# Patient Record
Sex: Male | Born: 1960
Health system: Southern US, Community
[De-identification: ages and names within clinical notes are randomized; demographics above are authoritative.]

## PROBLEM LIST (undated history)

## (undated) DIAGNOSIS — Z720 Tobacco use: Secondary | ICD-10-CM

## (undated) DIAGNOSIS — M549 Dorsalgia, unspecified: Secondary | ICD-10-CM

## (undated) DIAGNOSIS — I219 Acute myocardial infarction, unspecified: Secondary | ICD-10-CM

## (undated) DIAGNOSIS — G8929 Other chronic pain: Secondary | ICD-10-CM

## (undated) DIAGNOSIS — R011 Cardiac murmur, unspecified: Secondary | ICD-10-CM

## (undated) DIAGNOSIS — Z8739 Personal history of other diseases of the musculoskeletal system and connective tissue: Secondary | ICD-10-CM

## (undated) DIAGNOSIS — Z5900 Homelessness unspecified: Secondary | ICD-10-CM

## (undated) DIAGNOSIS — K219 Gastro-esophageal reflux disease without esophagitis: Secondary | ICD-10-CM

## (undated) DIAGNOSIS — K209 Esophagitis, unspecified without bleeding: Secondary | ICD-10-CM

## (undated) DIAGNOSIS — M1990 Unspecified osteoarthritis, unspecified site: Secondary | ICD-10-CM

## (undated) DIAGNOSIS — E785 Hyperlipidemia, unspecified: Secondary | ICD-10-CM

## (undated) DIAGNOSIS — Z9289 Personal history of other medical treatment: Secondary | ICD-10-CM

## (undated) DIAGNOSIS — I10 Essential (primary) hypertension: Secondary | ICD-10-CM

## (undated) DIAGNOSIS — Z0389 Encounter for observation for other suspected diseases and conditions ruled out: Secondary | ICD-10-CM

## (undated) DIAGNOSIS — F1010 Alcohol abuse, uncomplicated: Secondary | ICD-10-CM

## (undated) DIAGNOSIS — K709 Alcoholic liver disease, unspecified: Secondary | ICD-10-CM

## (undated) DIAGNOSIS — Z87898 Personal history of other specified conditions: Secondary | ICD-10-CM

## (undated) DIAGNOSIS — F329 Major depressive disorder, single episode, unspecified: Secondary | ICD-10-CM

## (undated) DIAGNOSIS — F1910 Other psychoactive substance abuse, uncomplicated: Secondary | ICD-10-CM

## (undated) DIAGNOSIS — F32A Depression, unspecified: Secondary | ICD-10-CM

## (undated) HISTORY — DX: Homelessness unspecified: Z59.00

## (undated) HISTORY — DX: Essential (primary) hypertension: I10

## (undated) HISTORY — DX: Alcohol abuse, uncomplicated: F10.10

## (undated) HISTORY — DX: Homelessness: Z59.0

## (undated) HISTORY — DX: Hyperlipidemia, unspecified: E78.5

## (undated) HISTORY — DX: Personal history of other specified conditions: Z87.898

## (undated) HISTORY — PX: FRACTURE SURGERY: SHX138

## (undated) HISTORY — DX: Other psychoactive substance abuse, uncomplicated: F19.10

## (undated) HISTORY — DX: Tobacco use: Z72.0

## (undated) HISTORY — DX: Acute myocardial infarction, unspecified: I21.9

---

## 1994-05-01 HISTORY — PX: TUMOR EXCISION: SHX421

## 2000-04-17 ENCOUNTER — Emergency Department (HOSPITAL_COMMUNITY): Admission: EM | Admit: 2000-04-17 | Discharge: 2000-04-17 | Payer: Self-pay

## 2001-05-01 DIAGNOSIS — I219 Acute myocardial infarction, unspecified: Secondary | ICD-10-CM

## 2001-05-01 HISTORY — DX: Acute myocardial infarction, unspecified: I21.9

## 2002-09-22 ENCOUNTER — Encounter: Payer: Self-pay | Admitting: Emergency Medicine

## 2002-09-22 ENCOUNTER — Emergency Department (HOSPITAL_COMMUNITY): Admission: EM | Admit: 2002-09-22 | Discharge: 2002-09-22 | Payer: Self-pay | Admitting: Emergency Medicine

## 2003-05-02 DIAGNOSIS — Z9289 Personal history of other medical treatment: Secondary | ICD-10-CM

## 2003-05-02 HISTORY — DX: Personal history of other medical treatment: Z92.89

## 2003-05-20 ENCOUNTER — Emergency Department (HOSPITAL_COMMUNITY): Admission: EM | Admit: 2003-05-20 | Discharge: 2003-05-21 | Payer: Self-pay | Admitting: Emergency Medicine

## 2003-06-30 HISTORY — PX: CLOSED REDUCTION TIBIAL FRACTURE: SHX1361

## 2003-07-30 ENCOUNTER — Inpatient Hospital Stay (HOSPITAL_COMMUNITY): Admission: AC | Admit: 2003-07-30 | Discharge: 2003-08-26 | Payer: Self-pay

## 2003-08-10 HISTORY — PX: PSEUDOANEURYSM REPAIR: SHX2272

## 2003-08-18 HISTORY — PX: FASCIOTOMY CLOSURE: SHX5829

## 2004-06-10 ENCOUNTER — Emergency Department (HOSPITAL_COMMUNITY): Admission: EM | Admit: 2004-06-10 | Discharge: 2004-06-10 | Payer: Self-pay | Admitting: Emergency Medicine

## 2004-06-14 ENCOUNTER — Emergency Department (HOSPITAL_COMMUNITY): Admission: EM | Admit: 2004-06-14 | Discharge: 2004-06-14 | Payer: Self-pay | Admitting: Emergency Medicine

## 2004-06-15 ENCOUNTER — Emergency Department (HOSPITAL_COMMUNITY): Admission: EM | Admit: 2004-06-15 | Discharge: 2004-06-15 | Payer: Self-pay | Admitting: Emergency Medicine

## 2004-06-19 ENCOUNTER — Emergency Department (HOSPITAL_COMMUNITY): Admission: EM | Admit: 2004-06-19 | Discharge: 2004-06-19 | Payer: Self-pay | Admitting: Emergency Medicine

## 2004-08-23 ENCOUNTER — Observation Stay (HOSPITAL_COMMUNITY): Admission: EM | Admit: 2004-08-23 | Discharge: 2004-08-23 | Payer: Self-pay | Admitting: Emergency Medicine

## 2005-03-27 ENCOUNTER — Emergency Department (HOSPITAL_COMMUNITY): Admission: EM | Admit: 2005-03-27 | Discharge: 2005-03-28 | Payer: Self-pay | Admitting: Emergency Medicine

## 2005-05-01 DIAGNOSIS — IMO0001 Reserved for inherently not codable concepts without codable children: Secondary | ICD-10-CM

## 2005-05-01 HISTORY — PX: CARDIAC CATHETERIZATION: SHX172

## 2005-05-01 HISTORY — DX: Reserved for inherently not codable concepts without codable children: IMO0001

## 2006-02-11 ENCOUNTER — Inpatient Hospital Stay (HOSPITAL_COMMUNITY): Admission: EM | Admit: 2006-02-11 | Discharge: 2006-02-15 | Payer: Self-pay | Admitting: Emergency Medicine

## 2006-02-11 ENCOUNTER — Encounter (INDEPENDENT_AMBULATORY_CARE_PROVIDER_SITE_OTHER): Payer: Self-pay | Admitting: *Deleted

## 2007-04-06 ENCOUNTER — Emergency Department (HOSPITAL_COMMUNITY): Admission: EM | Admit: 2007-04-06 | Discharge: 2007-04-07 | Payer: Self-pay | Admitting: Emergency Medicine

## 2007-04-24 ENCOUNTER — Emergency Department (HOSPITAL_COMMUNITY): Admission: EM | Admit: 2007-04-24 | Discharge: 2007-04-24 | Payer: Self-pay | Admitting: Emergency Medicine

## 2007-06-18 ENCOUNTER — Emergency Department (HOSPITAL_COMMUNITY): Admission: EM | Admit: 2007-06-18 | Discharge: 2007-06-19 | Payer: Self-pay | Admitting: Emergency Medicine

## 2008-05-01 ENCOUNTER — Inpatient Hospital Stay (HOSPITAL_COMMUNITY): Admission: EM | Admit: 2008-05-01 | Discharge: 2008-05-04 | Payer: Self-pay | Admitting: Emergency Medicine

## 2010-02-01 ENCOUNTER — Ambulatory Visit: Payer: Self-pay | Admitting: Internal Medicine

## 2010-02-01 ENCOUNTER — Observation Stay (HOSPITAL_COMMUNITY): Admission: EM | Admit: 2010-02-01 | Discharge: 2010-02-02 | Payer: Self-pay | Admitting: Emergency Medicine

## 2010-02-02 ENCOUNTER — Encounter: Payer: Self-pay | Admitting: Internal Medicine

## 2010-02-02 DIAGNOSIS — I252 Old myocardial infarction: Secondary | ICD-10-CM

## 2010-02-02 DIAGNOSIS — R002 Palpitations: Secondary | ICD-10-CM | POA: Insufficient documentation

## 2010-02-02 DIAGNOSIS — F1020 Alcohol dependence, uncomplicated: Secondary | ICD-10-CM | POA: Insufficient documentation

## 2010-02-02 DIAGNOSIS — I43 Cardiomyopathy in diseases classified elsewhere: Secondary | ICD-10-CM

## 2010-02-02 DIAGNOSIS — I119 Hypertensive heart disease without heart failure: Secondary | ICD-10-CM

## 2010-02-09 ENCOUNTER — Ambulatory Visit: Payer: Self-pay | Admitting: Internal Medicine

## 2010-02-09 ENCOUNTER — Encounter: Payer: Self-pay | Admitting: Internal Medicine

## 2010-02-09 LAB — CONVERTED CEMR LAB
HDL: 100 mg/dL (ref >39)
Triglycerides: 58 mg/dL (ref <150)

## 2010-02-16 ENCOUNTER — Telehealth: Payer: Self-pay | Admitting: Licensed Clinical Social Worker

## 2010-02-28 ENCOUNTER — Encounter: Payer: Self-pay | Admitting: Licensed Clinical Social Worker

## 2010-03-07 ENCOUNTER — Emergency Department (HOSPITAL_COMMUNITY): Admission: EM | Admit: 2010-03-07 | Discharge: 2010-03-07 | Payer: Self-pay | Admitting: Emergency Medicine

## 2010-04-12 ENCOUNTER — Ambulatory Visit: Payer: Self-pay | Admitting: Internal Medicine

## 2010-05-22 ENCOUNTER — Emergency Department (HOSPITAL_COMMUNITY)
Admission: EM | Admit: 2010-05-22 | Discharge: 2010-05-22 | Payer: Self-pay | Source: Home / Self Care | Admitting: Emergency Medicine

## 2010-05-24 LAB — POCT CARDIAC MARKERS
CKMB, poc: 13.2 ng/mL (ref 1.0–8.0)
Troponin i, poc: <0.05 ng/mL (ref 0.00–0.09)

## 2010-05-24 LAB — CBC
HCT: 39.4 % (ref 39.0–52.0)
Hemoglobin: 13.2 g/dL (ref 13.0–17.0)
MCHC: 33.5 g/dL (ref 30.0–36.0)
MCV: 93.1 fL (ref 78.0–100.0)

## 2010-05-24 LAB — POCT I-STAT, CHEM 8
Chloride: 105 mEq/L (ref 96–112)
Creatinine, Ser: 0.9 mg/dL (ref 0.4–1.5)
Glucose, Bld: 93 mg/dL (ref 70–99)
Potassium: 4.1 mEq/L (ref 3.5–5.1)

## 2010-05-25 ENCOUNTER — Ambulatory Visit: Admission: RE | Admit: 2010-05-25 | Discharge: 2010-05-25 | Payer: Self-pay | Source: Home / Self Care

## 2010-05-25 DIAGNOSIS — E785 Hyperlipidemia, unspecified: Secondary | ICD-10-CM | POA: Insufficient documentation

## 2010-05-31 NOTE — Discharge Summary (Signed)
Summary: Hospital Discharge Update    Hospital Discharge Update:  Date of Admission: 02/01/2010 Date of Discharge: 02/02/2010  Brief Summary:  Devin [name] was admitted for palpitations and SOB while moving a stove up a flight of stairs. ACS was ruled out, and his symptoms had resolved within hours of hospitalization. In 2003, he had a similar episode of paliptations/SOB that was diagnosed as an MI at [facility] in [place]. In 2007, he had a prolonged episode of sharp chest pain that was worked up at [facility] as atypical chest pain secondary to gastritis. During that admission, he had an echocardiogram showing mild LVH with 75% ejection fraction. He also had a cardiac catheterization in 2007 showing no vessel disease. During this admission, Devin [name] was found to have new, non-specific T-wave inversions and P-wave inversions on EKG that may be secondary to worsening LVH.  His hospital course was significant for HTN in the 140-150s/90-100s. He was discharged on metoprolol and HCTZ.  Labs needed at follow-up: Basic metabolic panel  Other follow-up issues:  -HTN medication management -chest pain and palpitation/SOB symptoms -alcohol abuse -medication adherence (financial concerns) ---> set up with Jaynee Eagles  Problem list changes:  Added new problem of HYPERTENSION, MODERATE (ICD-401.9) Added new problem of OLD MYOCARDIAL INFARCTION (ICD-412) Added new problem of PALPITATIONS (ICD-785.1) Added new problem of ALCOHOL ABUSE (ICD-305.00) Added new problem of INADEQUATE MATERIAL RESOURCES (ICD-V60.2)  Medication list changes:  Added new medication of METOPROLOL TARTRATE 50 MG TABS (METOPROLOL TARTRATE) Take 1 tablet by mouth two times a day. - Signed Added new medication of HYDROCHLOROTHIAZIDE 25 MG TABS (HYDROCHLOROTHIAZIDE) Take 1 tablet by mouth once a day. - Signed Added new medication of ASPIRIN 81 MG TBEC (ASPIRIN) Take 1 tablet by mouth once a day. Rx of METOPROLOL TARTRATE 50 MG  TABS (METOPROLOL TARTRATE) Take 1 tablet by mouth two times a day.;  #62 x 1;  Signed;  Entered by: Melida Quitter MD;  Authorized by: Melida Quitter MD;  Method used: Print then Give to Patient Rx of HYDROCHLOROTHIAZIDE 25 MG TABS (HYDROCHLOROTHIAZIDE) Take 1 tablet by mouth once a day.;  #31 x 0;  Signed;  Entered by: Melida Quitter MD;  Authorized by: Melida Quitter MD;  Method used: Print then Give to Patient  The medication, problem, and allergy lists have been updated.  Please see the dictated discharge summary for details.  Discharge medications:  METOPROLOL TARTRATE 50 MG TABS (METOPROLOL TARTRATE) Take 1 tablet by mouth two times a day. HYDROCHLOROTHIAZIDE 25 MG TABS (HYDROCHLOROTHIAZIDE) Take 1 tablet by mouth once a day. ASPIRIN 81 MG TBEC (ASPIRIN) Take 1 tablet by mouth once a day.  Other patient instructions:  1. Please return to [facility] on the ground floor of [facility] on Wednesday 02/09/10 at 1030 am for follow-up with Dr. Scot Dock. 2. Please take metoprolol, hydrochlorothiazide, and aspirin as directed.  Note: Hospital Discharge Medications & Other Instructions handout was printed, one copy for patient and a second copy to be placed in hospital chart.    Complete Medication List: 1)  Metoprolol Tartrate 50 Mg Tabs (Metoprolol tartrate) .... Take 1 tablet by mouth two times a day. 2)  Hydrochlorothiazide 25 Mg Tabs (Hydrochlorothiazide) .... Take 1 tablet by mouth once a day. 3)  Aspirin 81 Mg Tbec (Aspirin) .... Take 1 tablet by mouth once a day.  Prescriptions: HYDROCHLOROTHIAZIDE 25 MG TABS (HYDROCHLOROTHIAZIDE) Take 1 tablet by mouth once a day.  #31 x 0  Entered and Authorized by:   Melida Quitter MD   Signed by:   Melida Quitter MD on 02/02/2010   Method used:   Print then Give to Patient   RxID:   [id number] METOPROLOL TARTRATE 50 MG TABS (METOPROLOL TARTRATE) Take 1 tablet by mouth two times a day.  #62 x 1    Entered and Authorized by:   Melida Quitter MD   Signed by:   Melida Quitter MD on 02/02/2010   Method used:   Print then Give to Patient   RxID:   [id number]

## 2010-05-31 NOTE — Assessment & Plan Note (Signed)
Summary: HFU/NEW TO CLINIC/SB.   Vital Signs:  Patient profile:   50 year old male Height:      70 inches Weight:      160.0 pounds BMI:     23.04 Temp:     97.3 degrees F oral Pulse rate:   96 / minute BP sitting:   145 / 90  (right arm)  Vitals Entered By: Filomena Jungling NT II (February 09, 2010 10:19 AM) CC: HFU Is Patient Diabetic? No Pain Assessment Patient in pain? no      Nutritional Status BMI of 19 -24 = normal  Have you ever been in a relationship where you felt threatened, hurt or afraid?No   Does patient need assistance? Functional Status Self care Ambulation Normal   Primary Care Provider:  Bethel Born MD  CC:  HFU.  History of Present Illness: 50 y/o m with HTN, alcaholism comes for hfu  - did not take his meds as no money to buy them. He says he will get some money tomorrow - has not been drinking since dischrge - no CP, but does feel some occasional palpitation - no dizziness, but does has some black spots in his visual speed sometimes when he gets up. this has been going on for 1 yr now.  - has brought all the paperwork for SW meeting today but Lorri Frederick is on vacation for a week   Preventive Screening-Counseling & Management  Alcohol-Tobacco     Smoking Status: OCCASIONAL     Packs/Day: 1 EVERY COUPLE OF WEEKS  Current Medications (verified): 1)  Metoprolol Tartrate 50 Mg Tabs (Metoprolol Tartrate) .... Take 1 Tablet By Mouth Two Times A Day. 2)  Hydrochlorothiazide 25 Mg Tabs (Hydrochlorothiazide) .... Take 1 Tablet By Mouth Once A Day. 3)  Aspirin 81 Mg Tbec (Aspirin) .... Take 1 Tablet By Mouth Once A Day.  Allergies (verified): No Known Drug Allergies  Social History: Smoking Status:  OCCASIONAL Packs/Day:  1 EVERY COUPLE OF WEEKS  Review of Systems  The patient denies anorexia, fever, weight loss, weight gain, vision loss, decreased hearing, hoarseness, chest pain, syncope, dyspnea on exertion, peripheral edema, prolonged cough,  headaches, hemoptysis, abdominal pain, melena, hematochezia, severe indigestion/heartburn, hematuria, incontinence, genital sores, muscle weakness, suspicious skin lesions, transient blindness, difficulty walking, depression, unusual weight change, abnormal bleeding, enlarged lymph nodes, angioedema, breast masses, and testicular masses.    Physical Exam  General:  General: Alert, well developed and in no acurte distress ENT: mucous membranes pink & moist. No abnormal finds in ear and nose. CVC:S1 S2 , no murmurs, no abnormal heart sounds. Lungs: Clear to auscultation B/L. No wheezes, crackles or other abnormal sounds Abdomen: soft, non distended, no tender. Normal Bowel sounds EXT: no pitting edema, no engorged veins, Pulsations normal  Neuro:alert, oriented *3, cranial nerved 2-12 intact, strenght normal in all  extremities, senstations normal to light touch.     Impression & Recommendations:  Problem # 1:  INADEQUATE MATERIAL RESOURCES (ICD-V60.2)  Refer to Jaynee Eagles for assistance  Orders: Social Work Referral (Social )  Problem # 2:  ALCOHOL ABUSE (ICD-305.00)  not drinking since discharge.  Refer to [name] for counselling)  plan -counselled against starting alcahol  Orders: Social Work Referral (Social )  Problem # 3:  PALPITATIONS (ICD-785.1) not taking meds not had any palpitation or CP since discharge  plan -follow up after meeting with Jaynee Eagles  His updated medication list for this problem includes:    Metoprolol Tartrate  50 Mg Tabs (Metoprolol tartrate) .Marland Kitchen... Take 1 tablet by mouth two times a day.  Avoid caffeine, chocolate, and stimulants. Stress reduction as well as medication options discussed.   Problem # 4:  HYPERTENSION, MODERATE (ICD-401.9) he has not been taking any meds for high BP since discharge no anti-HTN meds in sample room BP acceptable  plan: await meeting with Jaynee Eagles for financial assistance follow up in  1 month FLP and  HbA1c for risk stratification  (patinet is insistent we get the labs today and not wait until he gets is orange card)  His updated medication list for this problem includes:    Metoprolol Tartrate 50 Mg Tabs (Metoprolol tartrate) .Marland Kitchen... Take 1 tablet by mouth two times a day.    Hydrochlorothiazide 25 Mg Tabs (Hydrochlorothiazide) .Marland Kitchen... Take 1 tablet by mouth once a day.  BP today: 145/90  Orders: T-Lipid Profile ([id number]) T-Hgb A1C (in-house) (41324MW)  Complete Medication List: 1)  Metoprolol Tartrate 50 Mg Tabs (Metoprolol tartrate) .... Take 1 tablet by mouth two times a day. 2)  Hydrochlorothiazide 25 Mg Tabs (Hydrochlorothiazide) .... Take 1 tablet by mouth once a day. 3)  Aspirin 81 Mg Tbec (Aspirin) .... Take 1 tablet by mouth once a day.  Patient Instructions: 1)  Please schedule a follow-up appointment in 1 month.  Process Orders Check Orders Results:     [facility]: ABN not required for this insurance Tests Sent for requisitioning (February 09, 2010 11:30 AM):     02/09/2010: [facility] -- T-Lipid Profile [phone] (signed)     Prevention & Chronic Care Immunizations   Influenza vaccine: Not documented   Influenza vaccine deferral: Refused  (02/09/2010)    Tetanus booster: Not documented   Td booster deferral: Refused  (02/09/2010)    Pneumococcal vaccine: Not documented  Other Screening   PSA: Not documented   Smoking status: OCCASIONAL  (02/09/2010)  Lipids   Total Cholesterol: Not documented   Lipid panel action/deferral: Deferred   LDL: Not documented   LDL Direct: Not documented   HDL: Not documented   Triglycerides: Not documented  Hypertension   Last Blood Pressure: 145 / 90  (02/09/2010)   Serum creatinine: Not documented   Serum potassium Not documented    Hypertension flowsheet reviewed?: Yes   Progress toward BP goal: Unchanged  Self-Management Support :    Patient will work on the following  items until the next clinic visit to reach self-care goals:     Medications and monitoring: take my medicines every day  (02/09/2010)     Eating: eat more vegetables, use fresh or frozen vegetables, eat foods that are low in salt, eat baked foods instead of fried foods, eat fruit for snacks and desserts  (02/09/2010)     Activity: take a 30 minute walk every day  (02/09/2010)    Hypertension self-management support: Education handout, Resources for patients handout, Written self-care plan  (02/09/2010)   Hypertension self-care plan printed.   Hypertension education handout printed      Resource handout printed.    Appended Document: Lab Order/a1c result    Lab Visit  Laboratory Results   Blood Tests   Date/Time Received: February 09, 2010 11:51 AM Date/Time Reported: Alric Quan  February 09, 2010 11:51 AM   HGBA1C: 5.2%   (Normal Range: Non-Diabetic - 3-6%   Control Diabetic - 6-8%)    Orders Today:

## 2010-05-31 NOTE — Progress Notes (Signed)
  Phone Note Outgoing Call   Summary of Call: Left message for patient to call Soc. Work  Follow-up for Phone Call        Left another message for Devin [name] to call SW.  Sending letter to his home.  [name]  February 28, 2010 10:03 AM

## 2010-05-31 NOTE — Letter (Signed)
Summary: Generic Letter  [facility]  [address]   [place], [place] [address]   Phone: [phone]  Fax: [phone]    02/28/2010     Devin [name] [address] [place], [place]  [address]  Dear [name],  Your physician asked that I contact you to explain the various resources that may be available to you through the health system and the community.  Please call me at [phone] between the houres of 9AM to 1PM.   Regards,      [name], LCSW Clinical Social Worker [facility]

## 2010-05-31 NOTE — Miscellaneous (Signed)
Summary: PATIENT CONSENT FORM  PATIENT CONSENT FORM   Imported By: Louretta Parma 02/10/2010 15:46:51  _____________________________________________________________________  External Attachment:    Type:   Image     Comment:   External Document

## 2010-06-02 NOTE — Assessment & Plan Note (Signed)
Summary: EST-F/U VISIT/CH   Vital Signs:  Patient profile:   50 year old male Height:      70 inches (177.80 cm) Weight:      156.7 pounds (71.23 kg) BMI:     22.57 Temp:     97.9 degrees F (36.61 degrees C) oral Pulse rate:   103 / minute BP sitting:   128 / 83  (left arm)  Vitals Entered By: Stanton Kidney Ditzler RN (May 25, 2010 4:10 PM) Is Patient Diabetic? No Pain Assessment Patient in pain? no      Nutritional Status BMI of 19 -24 = normal Nutritional Status Detail appetite down  Have you ever been in a relationship where you felt threatened, hurt or afraid?denies   Does patient need assistance? Functional Status Self care Ambulation Normal Comments Refills on meds - went to ER 05/22/10 - out of meds x 2 weeks. Past year blurred vision and dizziness.   Primary Care Provider:  Bethel Born MD   History of Present Illness: 50 y/o m with HTN, alcaholism comes for medication refill  no complaint today. He was in the ER 3 ays ago for CP which was ruled out for ACS. He has not been able to take his medication for 2 weeks due to financial reasons.   he continues to drink (hardliquor frequently) ,  he smokes occasionally. no drugs.     Depression History:      The patient denies a depressed mood most of the day and a diminished interest in his usual daily activities.         Preventive Screening-Counseling & Management  Alcohol-Tobacco     Smoking Status: quit     Smoking Cessation Counseling: yes     Packs/Day: 1 EVERY COUPLE OF WEEKS  Current Medications (verified): 1)  Metoprolol Tartrate 50 Mg Tabs (Metoprolol Tartrate) .... Take 1 Tablet By Mouth Two Times A Day. 2)  Hydrochlorothiazide 25 Mg Tabs (Hydrochlorothiazide) .... Take 1 Tablet By Mouth Once A Day. 3)  Aspirin 81 Mg Tbec (Aspirin) .... Take 1 Tablet By Mouth Once A Day.  Allergies (verified): No Known Drug Allergies  Social History: Smoking Status:  quit  Review of Systems  The patient  denies anorexia, fever, weight loss, weight gain, vision loss, decreased hearing, hoarseness, chest pain, syncope, dyspnea on exertion, peripheral edema, prolonged cough, headaches, hemoptysis, abdominal pain, melena, hematochezia, severe indigestion/heartburn, hematuria, incontinence, genital sores, muscle weakness, suspicious skin lesions, transient blindness, difficulty walking, depression, unusual weight change, abnormal bleeding, enlarged lymph nodes, angioedema, breast masses, and testicular masses.    Physical Exam  General:  Gen: VS reveiwed, Alert, well developed, nodistress ENT: mucous membranes pink & moist. No abnormal finds in ear and nose. CVC:S1 S2 , no murmurs, no abnormal heart sounds. Lungs: Clear to auscultation B/L. No wheezes, crackles or other abnormal sounds Abdomen: soft, non distended, no tender. Normal Bowel sounds EXT: no pitting edema, no engorged veins, Pulsations normal  Neuro:alert, oriented *3, cranial nerved 2-12 intact, strenght normal in all  extremities, senstations normal to light touch.      Impression & Recommendations:  Problem # 1:  INADEQUATE MATERIAL RESOURCES (ICD-V60.2) will get him in touch in [facility] today, he has not worked out his orange card paperwork still  Problem # 2:  HYPERTENSION, MODERATE (ICD-401.9) controlled  His updated medication list for this problem includes:    Metoprolol Tartrate 50 Mg Tabs (Metoprolol tartrate) .Marland Kitchen... Take 1 tablet by mouth two times a day.  Hydrochlorothiazide 25 Mg Tabs (Hydrochlorothiazide) .Marland Kitchen... Take 1 tablet by mouth once a day.  BP today: 128/83 Prior BP: 130/86 (04/12/2010)  Labs Reviewed: Chol: 186 (02/09/2010)   HDL: 100 (02/09/2010)   LDL: 74 (02/09/2010)   TG: 58 (02/09/2010)  Problem # 3:  PALPITATIONS (ICD-785.1) still tachycardic, not compliant with medications he says he will get his medications next week  His updated medication list for this problem includes:    Metoprolol  Tartrate 50 Mg Tabs (Metoprolol tartrate) .Marland Kitchen... Take 1 tablet by mouth two times a day.  Avoid caffeine, chocolate, and stimulants. Stress reduction as well as medication options discussed.   Problem # 4:  HYPERLIPIDEMIA (ICD-272.4) will start pravastatin given his high risk factorrs.   His updated medication list for this problem includes:    Pravastatin Sodium 20 Mg Tabs (Pravastatin sodium) .Marland Kitchen... Take 1 tablet by mouth once a day  Complete Medication List: 1)  Metoprolol Tartrate 50 Mg Tabs (Metoprolol tartrate) .... Take 1 tablet by mouth two times a day. 2)  Hydrochlorothiazide 25 Mg Tabs (Hydrochlorothiazide) .... Take 1 tablet by mouth once a day. 3)  Aspirin 81 Mg Tbec (Aspirin) .... Take 1 tablet by mouth once a day. 4)  Pravastatin Sodium 20 Mg Tabs (Pravastatin sodium) .... Take 1 tablet by mouth once a day  Patient Instructions: 1)  Please schedule a follow-up appointment in 2 weeks. Prescriptions: PRAVASTATIN SODIUM 20 MG TABS (PRAVASTATIN SODIUM) Take 1 tablet by mouth once a day  #31 x 3   Entered and Authorized by:   Bethel Born MD   Signed by:   Bethel Born MD on 05/25/2010   Method used:   Electronically to        [facility] (retail)       [address]       [place], [place]  [address]       Ph: [phone]       Fax: [phone]   RxID:   [id number]    Orders Added: 1)  Est. Patient Level IV [03474]     Prevention & Chronic Care Immunizations   Influenza vaccine: Not documented   Influenza vaccine deferral: Refused  (02/09/2010)    Tetanus booster: Not documented   Td booster deferral: Refused  (02/09/2010)    Pneumococcal vaccine: Not documented  Other Screening   PSA: Not documented   Smoking status: quit  (05/25/2010)  Lipids   Total Cholesterol: 186  (02/09/2010)   Lipid panel action/deferral: Deferred   LDL: 74  (02/09/2010)   LDL Direct: Not documented   HDL: 100  (02/09/2010)    Triglycerides: 58  (02/09/2010)    SGOT (AST): Not documented   SGPT (ALT): Not documented   Alkaline phosphatase: Not documented   Total bilirubin: Not documented  Hypertension   Last Blood Pressure: 128 / 83  (05/25/2010)   Serum creatinine: Not documented   Serum potassium Not documented  Self-Management Support :   Personal Goals (by the next clinic visit) :      Personal blood pressure goal: 140/90  (05/25/2010)   Patient will work on the following items until the next clinic visit to reach self-care goals:     Medications and monitoring: take my medicines every day, bring all of my medications to every visit  (05/25/2010)     Eating: eat more vegetables, use fresh or frozen vegetables, eat fruit for snacks and desserts  (05/25/2010)  Activity: take a 30 minute walk every day, park at the far end of the parking lot  (05/25/2010)    Hypertension self-management support: Written self-care plan, Education handout, Resources for patients handout  (05/25/2010)   Hypertension self-care plan printed.   Hypertension education handout printed    Lipid self-management support: Resources for patients handout  (05/25/2010)        Resource handout printed.

## 2010-06-02 NOTE — Assessment & Plan Note (Signed)
Summary: est-1 month f/u visit/ch   Vital Signs:  Patient profile:   50 year old male Height:      70 inches (177.80 cm) Weight:      157 pounds (71.36 kg) BMI:     22.61 Temp:     97.7 degrees F (36.50 degrees C) oral Pulse rate:   106 / minute BP sitting:   130 / 86  (right arm)  Vitals Entered By: Angelina Ok RN (April 12, 2010 3:38 PM) CC: Depression Is Patient Diabetic? No Nutritional Status BMI of 19 -24 = normal  Have you ever been in a relationship where you felt threatened, hurt or afraid?No   Does patient need assistance? Functional Status Self care Ambulation Normal Comments Needs refills on meds.  Needs check up.   Primary Care Provider:  Bethel Born MD  CC:  Depression.  History of Present Illness: 50 y/o m with HTN, alcaholism comes for hfu  - has been compliant with medications - smokes 1-2 cig/day - drinks hard liquor frequently - no new complaints today, requests disability benifit   Depression History:      The patient denies a depressed mood most of the day and a diminished interest in his usual daily activities.  The patient denies impaired concentration (indecisiveness).         Preventive Screening-Counseling & Management  Alcohol-Tobacco     Smoking Status: OCCASIONAL     Smoking Cessation Counseling: yes     Packs/Day: 1 EVERY COUPLE OF WEEKS  Current Medications (verified): 1)  Metoprolol Tartrate 50 Mg Tabs (Metoprolol Tartrate) .... Take 1 Tablet By Mouth Two Times A Day. 2)  Hydrochlorothiazide 25 Mg Tabs (Hydrochlorothiazide) .... Take 1 Tablet By Mouth Once A Day. 3)  Aspirin 81 Mg Tbec (Aspirin) .... Take 1 Tablet By Mouth Once A Day.  Allergies (verified): No Known Drug Allergies  Review of Systems  The patient denies anorexia, fever, weight loss, weight gain, vision loss, decreased hearing, hoarseness, chest pain, syncope, dyspnea on exertion, peripheral edema, prolonged cough, headaches, hemoptysis, abdominal pain,  melena, hematochezia, severe indigestion/heartburn, hematuria, incontinence, genital sores, muscle weakness, suspicious skin lesions, transient blindness, difficulty walking, depression, unusual weight change, abnormal bleeding, enlarged lymph nodes, angioedema, breast masses, and testicular masses.    Physical Exam  General:  Gen: VS reveiwed, Alert, well developed, nodistress ENT: mucous membranes pink & moist. No abnormal finds in ear and nose. CVC:S1 S2 , no murmurs, no abnormal heart sounds. Lungs: Clear to auscultation B/L. No wheezes, crackles or other abnormal sounds Abdomen: soft, non distended, no tender. Normal Bowel sounds EXT: no pitting edema, no engorged veins, Pulsations normal  Neuro:alert, oriented *3, cranial nerved 2-12 intact, strenght normal in all  extremities, senstations normal to light touch.      Impression & Recommendations:  Problem # 1:  ALCOHOL ABUSE (ICD-305.00) conitnues to drink and smoke counselled against drinking refuses consulling session with Vladimir Creeks  Problem # 2:  HYPERTENSION, MODERATE (ICD-401.9) well controlled with current meds patient is compliant now   His updated medication list for this problem includes:    Metoprolol Tartrate 50 Mg Tabs (Metoprolol tartrate) .Marland Kitchen... Take 1 tablet by mouth two times a day.    Hydrochlorothiazide 25 Mg Tabs (Hydrochlorothiazide) .Marland Kitchen... Take 1 tablet by mouth once a day.  Problem # 3:  PALPITATIONS (ICD-785.1)  Patient is agitated and anxious HR >100 is on metorpolol two times a day dd- anxiety, hyperthyroidism, withdrawal, cocaine?? (he refuses strongly), back pain  plan-  conitnue toprol check TSH,  His updated medication list for this problem includes:    Metoprolol Tartrate 50 Mg Tabs (Metoprolol tartrate) .Marland Kitchen... Take 1 tablet by mouth two times a day.  Orders: T-TSH ([id number])  Complete Medication List: 1)  Metoprolol Tartrate 50 Mg Tabs (Metoprolol tartrate) .... Take 1 tablet  by mouth two times a day. 2)  Hydrochlorothiazide 25 Mg Tabs (Hydrochlorothiazide) .... Take 1 tablet by mouth once a day. 3)  Aspirin 81 Mg Tbec (Aspirin) .... Take 1 tablet by mouth once a day.  Patient Instructions: 1)  Please schedule a follow-up appointment in 3 months. 2)  Tobacco is very bad for your health and your loved ones! You Should stop smoking!. 3)  Stop Smoking Tips: Choose a Quit date. Cut down before the Quit date. decide what you will do as a substitute when you feel the urge to smoke(gum,toothpick,exercise). 4)  It is important that you exercise regularly at least 20 minutes 5 times a week. If you develop chest pain, have severe difficulty breathing, or feel very tired , stop exercising immediately and seek medical attention. 5)  It is not healthy  for men to drink more than 2-3 drinks per day or for women to drink more than 1-2 drinks per day. Prescriptions: METOPROLOL TARTRATE 50 MG TABS (METOPROLOL TARTRATE) Take 1 tablet by mouth two times a day.  #62 x 3   Entered and Authorized by:   Bethel Born MD   Signed by:   Bethel Born MD on 04/12/2010   Method used:   Electronically to        [facility] (retail)       [address]       [place], [place]  [address]       Ph: [phone]       Fax: [phone]   RxID:   [id number] HYDROCHLOROTHIAZIDE 25 MG TABS (HYDROCHLOROTHIAZIDE) Take 1 tablet by mouth once a day.  #31 x 3   Entered and Authorized by:   Bethel Born MD   Signed by:   Bethel Born MD on 04/12/2010   Method used:   Electronically to        [facility] (retail)       [address]       [place], [place]  [address]       Ph: [phone]       Fax: [phone]   RxID:   [id number]    Orders Added: 1)  T-TSH [id number] 2)  Est. Patient Level IV [62694]    Prevention & Chronic Care Immunizations   Influenza vaccine: Not documented   Influenza vaccine deferral:  Refused  (02/09/2010)    Tetanus booster: Not documented   Td booster deferral: Refused  (02/09/2010)    Pneumococcal vaccine: Not documented  Other Screening   PSA: Not documented   Smoking status: OCCASIONAL  (04/12/2010)  Lipids   Total Cholesterol: 186  (02/09/2010)   Lipid panel action/deferral: Deferred   LDL: 74  (02/09/2010)   LDL Direct: Not documented   HDL: 100  (02/09/2010)   Triglycerides: 58  (02/09/2010)  Hypertension   Last Blood Pressure: 130 / 86  (04/12/2010)   Serum creatinine: Not documented   Serum potassium Not documented    Hypertension flowsheet reviewed?: Yes   Progress toward BP goal: Improved  Self-Management Support :  Patient will work on the following items until the next clinic visit to reach self-care goals:     Medications and monitoring: take my medicines every day, bring all of my medications to every visit  (04/12/2010)     Eating: drink diet soda or water instead of juice or soda, eat more vegetables, use fresh or frozen vegetables, eat foods that are low in salt, eat baked foods instead of fried foods, eat fruit for snacks and desserts, limit or avoid alcohol  (04/12/2010)     Activity: take a 30 minute walk every day  (04/12/2010)    Hypertension self-management support: Written self-care plan, Education handout, Pre-printed educational material, Resources for patients handout  (04/12/2010)   Hypertension self-care plan printed.   Hypertension education handout printed      Resource handout printed.    Vital Signs:  Patient profile:   50 year old male Height:      70 inches (177.80 cm) Weight:      157 pounds (71.36 kg) BMI:     22.61 Temp:     97.7 degrees F (36.50 degrees C) oral Pulse rate:   106 / minute BP sitting:   130 / 86  (right arm)  Vitals Entered By: Angelina Ok RN (April 12, 2010 3:38 PM)   Process Orders Check Orders Results:     [facility]: ABN not required for this  insurance Order queued for requisitioning for [facility]: April 12, 2010 4:22 PM Tests Sent for requisitioning (April 12, 2010 4:36 PM):     04/12/2010: [facility] -- T-TSH [phone] (signed)

## 2010-06-08 ENCOUNTER — Ambulatory Visit: Payer: Self-pay | Admitting: Internal Medicine

## 2010-06-08 ENCOUNTER — Ambulatory Visit (INDEPENDENT_AMBULATORY_CARE_PROVIDER_SITE_OTHER): Payer: Self-pay | Admitting: Internal Medicine

## 2010-06-08 ENCOUNTER — Encounter: Payer: Self-pay | Admitting: Internal Medicine

## 2010-06-08 DIAGNOSIS — I252 Old myocardial infarction: Secondary | ICD-10-CM

## 2010-06-08 DIAGNOSIS — I10 Essential (primary) hypertension: Secondary | ICD-10-CM

## 2010-06-08 LAB — BASIC METABOLIC PANEL
BUN: 19 mg/dL (ref 6–23)
CO2: 24 mEq/L (ref 19–32)
Chloride: 99 mEq/L (ref 96–112)
Creat: 1.07 mg/dL (ref 0.40–1.50)
Glucose, Bld: 80 mg/dL (ref 70–99)

## 2010-06-08 MED ORDER — METOPROLOL TARTRATE 50 MG PO TABS: 50.0000 mg | ORAL_TABLET | Freq: Two times a day (BID) | ORAL | Status: DC

## 2010-06-08 MED ORDER — HYDROCHLOROTHIAZIDE 25 MG PO TABS: 25.0000 mg | ORAL_TABLET | Freq: Every day | ORAL | Status: DC

## 2010-06-08 NOTE — Assessment & Plan Note (Addendum)
Seen in 2003 at [facility] for "Heart Attack"  He has a limited history of this-no episodes since then. Not clear if UDS done at last [facility] admission in Nov 2011 for palpitations-will obtain today and refill medications, update pills

## 2010-06-08 NOTE — Progress Notes (Signed)
Addended byChinita Pester on: 06/08/2010 04:01 PM   Modules accepted: Orders

## 2010-06-08 NOTE — Assessment & Plan Note (Signed)
Here for refill of his BP medications-he is homeless, says only occasional alcohol use since [place]  Is applying for SSI-not approved at this point. Has had several rejections. Has not had an atty work with him onthis recently.

## 2010-06-08 NOTE — Progress Notes (Signed)
  Subjective:    Patient ID: Devin [name], male    DOB: 07/19/1960, 50 y.o.   MRN: [id number]  HPI    Review of Systems     Objective:   Physical Exam  Nursing note and vitals reviewed. Constitutional: He is oriented to person, place, and time. He appears well-developed and well-nourished.  Cardiovascular: Normal rate.   Pulmonary/Chest: No respiratory distress. He has no wheezes. He has no rales. He exhibits no tenderness.  Neurological: He is alert and oriented to person, place, and time.  Skin: Skin is warm.  Psychiatric: He has a normal mood and affect. His behavior is normal.          Assessment & Plan:

## 2010-06-09 LAB — DRUGS OF ABUSE SCREEN W/O ALC, ROUTINE URINE
Barbiturate Quant, Ur: NEGATIVE
Creatinine,U: 163.8 mg/dL
Opiate Screen, Urine: NEGATIVE
Propoxyphene: NEGATIVE

## 2010-07-13 LAB — CBC
HCT: 39.3 % (ref 39.0–52.0)
MCHC: 33.1 g/dL (ref 30.0–36.0)
RDW: 13.3 % (ref 11.5–15.5)

## 2010-07-13 LAB — COMPREHENSIVE METABOLIC PANEL
ALT: 40 U/L (ref 0–53)
AST: 74 U/L — ABNORMAL HIGH (ref 0–37)
Alkaline Phosphatase: 81 U/L (ref 39–117)
CO2: 25 mEq/L (ref 19–32)
Chloride: 104 mEq/L (ref 96–112)
GFR calc non Af Amer: >60 mL/min (ref >60)
Glucose, Bld: 100 mg/dL — ABNORMAL HIGH (ref 70–99)
Potassium: 3.9 mEq/L (ref 3.5–5.1)
Sodium: 136 mEq/L (ref 135–145)

## 2010-07-13 LAB — RAPID URINE DRUG SCREEN, HOSP PERFORMED
Barbiturates: NOT DETECTED
Benzodiazepines: NOT DETECTED
Cocaine: NOT DETECTED
Opiates: NOT DETECTED

## 2010-07-13 LAB — POCT CARDIAC MARKERS
Troponin i, poc: <0.05 ng/mL (ref 0.00–0.09)
Troponin i, poc: <0.05 ng/mL (ref 0.00–0.09)

## 2010-07-13 LAB — POCT I-STAT, CHEM 8
Calcium, Ion: 1.22 mmol/L (ref 1.12–1.32)
Chloride: 106 mEq/L (ref 96–112)
HCT: 43 % (ref 39.0–52.0)
Hemoglobin: 14.6 g/dL (ref 13.0–17.0)

## 2010-07-13 LAB — DIFFERENTIAL
Basophils Absolute: 0 10*3/uL (ref 0.0–0.1)
Basophils Relative: 0 % (ref 0–1)
Monocytes Absolute: 0.5 10*3/uL (ref 0.1–1.0)
Neutro Abs: 3.4 10*3/uL (ref 1.7–7.7)
Neutrophils Relative %: 71 % (ref 43–77)

## 2010-07-13 LAB — PROTIME-INR: Prothrombin Time: 14.8 seconds (ref 11.6–15.2)

## 2010-08-13 ENCOUNTER — Emergency Department (HOSPITAL_COMMUNITY): Payer: Self-pay

## 2010-08-13 ENCOUNTER — Emergency Department (HOSPITAL_COMMUNITY)
Admission: EM | Admit: 2010-08-13 | Discharge: 2010-08-14 | Disposition: A | Discharge status: Home / Self Care | Payer: Self-pay | Attending: Emergency Medicine | Admitting: Emergency Medicine

## 2010-08-13 DIAGNOSIS — R4182 Altered mental status, unspecified: Secondary | ICD-10-CM | POA: Insufficient documentation

## 2010-08-13 DIAGNOSIS — F1010 Alcohol abuse, uncomplicated: Secondary | ICD-10-CM | POA: Insufficient documentation

## 2010-08-13 DIAGNOSIS — R269 Unspecified abnormalities of gait and mobility: Secondary | ICD-10-CM | POA: Insufficient documentation

## 2010-08-13 LAB — CBC
Hemoglobin: 14 g/dL (ref 13.0–17.0)
MCH: 33.3 pg (ref 26.0–34.0)
MCV: 96.2 fL (ref 78.0–100.0)
RBC: 4.21 MIL/uL — ABNORMAL LOW (ref 4.22–5.81)
WBC: 6.4 10*3/uL (ref 4.0–10.5)

## 2010-08-13 LAB — URINALYSIS, ROUTINE W REFLEX MICROSCOPIC
Bilirubin Urine: NEGATIVE
Glucose, UA: NEGATIVE mg/dL
Hgb urine dipstick: NEGATIVE
Ketones, ur: NEGATIVE mg/dL
Protein, ur: NEGATIVE mg/dL
Urobilinogen, UA: 0.2 mg/dL (ref 0.0–1.0)

## 2010-08-13 LAB — DIFFERENTIAL
Basophils Relative: 1 % (ref 0–1)
Lymphs Abs: 2.4 10*3/uL (ref 0.7–4.0)
Monocytes Relative: 11 % (ref 3–12)
Neutro Abs: 3.1 10*3/uL (ref 1.7–7.7)
Neutrophils Relative %: 48 % (ref 43–77)

## 2010-08-13 LAB — RAPID URINE DRUG SCREEN, HOSP PERFORMED
Amphetamines: NOT DETECTED
Barbiturates: NOT DETECTED
Benzodiazepines: NOT DETECTED
Cocaine: NOT DETECTED

## 2010-08-13 LAB — BASIC METABOLIC PANEL
Chloride: 105 mEq/L (ref 96–112)
GFR calc non Af Amer: >60 mL/min (ref >60)
Glucose, Bld: 96 mg/dL (ref 70–99)
Potassium: 3.9 mEq/L (ref 3.5–5.1)
Sodium: 138 mEq/L (ref 135–145)

## 2010-08-13 LAB — GLUCOSE, CAPILLARY: Glucose-Capillary: 94 mg/dL (ref 70–99)

## 2010-08-14 LAB — COMPREHENSIVE METABOLIC PANEL
AST: 78 U/L — ABNORMAL HIGH (ref 0–37)
BUN: 15 mg/dL (ref 6–23)
CO2: 22 mEq/L (ref 19–32)
Chloride: 109 mEq/L (ref 96–112)
Creatinine, Ser: 0.83 mg/dL (ref 0.4–1.5)
GFR calc non Af Amer: >60 mL/min (ref >60)
Glucose, Bld: 90 mg/dL (ref 70–99)
Total Bilirubin: 0.8 mg/dL (ref 0.3–1.2)

## 2010-08-14 LAB — AMMONIA: Ammonia: 36 umol/L — ABNORMAL HIGH (ref 11–35)

## 2010-08-14 LAB — ETHANOL
Alcohol, Ethyl (B): 401 mg/dL (ref 0–10)
Alcohol, Ethyl (B): 356 mg/dL — ABNORMAL HIGH (ref 0–10)

## 2010-08-15 LAB — CBC
MCV: 93.8 fL (ref 78.0–100.0)
Platelets: 317 10*3/uL (ref 150–400)
WBC: 8.7 10*3/uL (ref 4.0–10.5)

## 2010-08-15 LAB — BASIC METABOLIC PANEL
BUN: 13 mg/dL (ref 6–23)
Calcium: 9 mg/dL (ref 8.4–10.5)
Chloride: 105 mEq/L (ref 96–112)
Creatinine, Ser: 1.14 mg/dL (ref 0.4–1.5)

## 2010-09-13 ENCOUNTER — Encounter: Payer: Self-pay | Admitting: Internal Medicine
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
## 2010-09-16 NOTE — Discharge Summary (Signed)
Devin [name], Devin [name]                ACCOUNT NO.:  [id number]   MEDICAL RECORD NO.:  [id number]          PATIENT TYPE:  INP   LOCATION:  3706                         FACILITY:  [facility]   PHYSICIAN:  Hind I Elsaid, MD      DATE OF BIRTH:  1960/12/26   DATE OF ADMISSION:  02/10/2006  DATE OF DISCHARGE:  02/15/2006                                 DISCHARGE SUMMARY   DISCHARGE DIAGNOSES:  1. Atypical chest pain.  2. Hypertension.  3. Alcoholic abuse.  4. Sinusitis.  5. Anemia.   DISCHARGE MEDICATIONS:  1. Metoprolol 50 mg p.o. b.i.d.  2. Thiamine 100 mg p.o. daily.  3. Protonix 40 mg p.o. daily.  4. Folic acid 1 mg p.o. daily.  5. Ferrous sulfate 325 mg p.o. t.i.d.   PLANS ON DISCHARGE:  To follow a gastroenterologist and primary care  physician.   CONSULT:  Cardiology consulted from [facility] regarding the chest  pain.   BRIEF HISTORY:  [name] is a 50 year old male with a history of  alcohol abuse, admitted you can review the admission done by Dr. Michaelyn Barter, mainly admitted for chest pain and syncope and found to have  elevated alcoholic level on admission.  On hospital stay, regarding the  chest pain, the patient admitted to telemetry floor for cardiac monitor.  His cardiac enzymes was checked, which was completely negative.  Cardiology  was consulted where an echo done, which showed left ventricular hypertrophy  and, according to cardiology, they recommend myocardial perfusion studies,  which showed some abnormalities, where echo showed ejection fraction of 75%  and there is no regional wall motion abnormalities.  Myocardial perfusion  was done, which revealed debility of anterior septal wall segment, which is  gone with ischemia with ejection fraction of 65%.  According to that,  cardiology recommended the patient to undergo a cardiac cath, which was done  on October 17, where the results were coronary artery was completely normal  and cardiology  recommended no coronary artery disease, will continue  treatment for hypertension and recommend proton pump inhibitor and may need  gastroenterologist to workup if the abdominal pain continues.  So, the  patient was discharged on metoprolol 50 mg p.o. b.i.d.  During  hospitalization, patient also for syncope mainly.  Since the cardiac monitor  was completely negative, we recommend that syncope is mainly due to alcohol  abuse and alcoholic intoxication at that time.  Hypertension according to  the echo, which showed left ventricular hypertrophy.  We will continue with  metoprolol.  Patient also had mild elevation of LFTs, where alkaline  phosphatase was 171, AST 59 and ALT 39.  We did a hepatitis profile, which  was completely negative and we assume this is due to alcoholic hepatitis.  Patient discharged on thiamine and folic acid, but during hospitalization,  patient offered alcoholic group and he refused and he admitted that he does  not have any alcoholism.  With anemia, anemia workup was done, which showed  a ferritin of 9, iron of 42 and total iron binding 32 and saturation  of 10%.  Accordingly, the patient is started on ferrous sulfate 325 mg p.o. daily and  folic acid, due to alcoholism.  Patient advised to follow up with the  gastroenterologist for the chest painand anemia.  He could have gastritis.  On discharge, patient was completely stable with a vital sign of a  temperature of 97.4, pulse rate 74,  respiratory rate of 20, blood pressure of 119/79.  The patient's lab is  sodium 140, potassium 3.9, chloride 106, bicarb 26, BUN 8, creatinine 0.9  and glucose 96.  White blood cells of 6.7, hemoglobin of 10.9, hematocrit  32.1 and platelets 309.      Hind Bosie Helper, MD  Electronically Signed     HIE/MEDQ  D:  02/19/2006  T:  02/19/2006  Job:  [id number]

## 2010-09-16 NOTE — Op Note (Signed)
Devin [name], Devin [name]                          ACCOUNT NO.:  [id number]   MEDICAL RECORD NO.:  [id number]                   PATIENT TYPE:  INP   LOCATION:                                       FACILITY:  [facility]   PHYSICIAN:  Di Kindle. Edilia Bo, M.D.        DATE OF BIRTH:  1961/01/10   DATE OF PROCEDURE:  08/18/2003  DATE OF DISCHARGE:                                 OPERATIVE REPORT   PREOPERATIVE DIAGNOSIS:  Status post fasciotomy of left lower extremity.   POSTOPERATIVE DIAGNOSIS:  Status post fasciotomy of left lower extremity.   OPERATION PERFORMED:  Closure of fasciotomy.   SURGEON:  Di Kindle. Edilia Bo, M.D.   ASSISTANT:  Nurse.   ANESTHESIA:  General.   DESCRIPTION OF PROCEDURE:  The patient was taken to the operating room and  received a general anesthetic.  The left lower extremity was prepped and  draped in the usual sterile fashion.  The edges of the skin were undermined  slightly and the wound irrigated with saline.  The wound was then closed  with interrupted 3-0 nylon vertical mattress sutures.  Of note, there was  some tension on the wound and therefore I made some small stab incisions  with a 15 blade along the posterior aspect of this incision in three rows  providing some relaxation.  Sterile dressing was applied.  The patient  tolerated the procedure well.                                               Di Kindle. Edilia Bo, M.D.    CSD/MEDQ  D:  08/18/2003  T:  08/19/2003  Job:  [id number]

## 2010-09-16 NOTE — Discharge Summary (Signed)
Devin [name], Devin [name]                ACCOUNT NO.:  [id number]   MEDICAL RECORD NO.:  [id number]          PATIENT TYPE:  INP   LOCATION:  3706                         FACILITY:  [facility]   PHYSICIAN:  Hind I Elsaid, MD      DATE OF BIRTH:  12-17-1960   DATE OF ADMISSION:  02/10/2006  DATE OF DISCHARGE:  02/15/2006                                 DISCHARGE SUMMARY   ADDENDUM   LABORATORIES:  On discharge sodium was 140, potassium was 3.9, chloride 106,  CO2 was 26, glucose 96, BUN 8, creatinine 0.8, calcium 9.4.  His hepatic  function tests were bilirubin 0.3, direct 1.1, and alkaline phosphatase 70,  AST 44, ALT 44, total protein 6.4, albumin is 2.9.  Hepatitis B surface  antigen was 93.  Hepatitis B core antibody IgM was negative.  Hepatitis A  IgM was negative.  Hepatitis C antibody was negative.  Folate level was 631.  Free T4 0.93 and TSH is 1.765.      Hind Bosie Helper, MD  Electronically Signed     HIE/MEDQ  D:  02/15/2006  T:  02/16/2006  Job:  [id number]
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
## 2010-09-16 NOTE — Discharge Summary (Signed)
Devin [name], Devin [name]                ACCOUNT NO.:  [id number]   MEDICAL RECORD NO.:  [id number]          PATIENT TYPE:  INP   LOCATION:  3706                         FACILITY:  [facility]   PHYSICIAN:  Hind I Elsaid, MD      DATE OF BIRTH:  Sep 23, 1960   DATE OF ADMISSION:  02/10/2006  DATE OF DISCHARGE:  02/15/2006                                 DISCHARGE SUMMARY   DISCHARGE DIAGNOSES:  1. Atypical chest pain, which could be due to gastritis.  2. Alcohol abuse.  3. Hypertension.  4. Anemia.   PROCEDURE:  February 11, 2006, patient underwent an ultrasound of the abdomen  due to atypical chest pain and mildly elevated liver function tests.  Ultrasound was negative.  February 11, 2006, CT angio was done for the  patient, which was negative.  No sign of pulmonary embolism.  October 16th,  patient underwent myocardial perfusion studies which showed an area of  reversibility in the anterior septal wall, compatible with ischemia and an  ejection fraction of 65%.  October 17th, patient underwent cardiac cath  which showed no evidence of coronary disease in the left dominant system and  normal left ventricular systolic function.  Mild left ventricular  hypertrophy.  No renal artery stenosis or abdominal aortic aneurysm.   DISCHARGE MEDICATIONS:  Folic acid 1 mg p.o. daily, ferrous sulfate 235 mg  p.o. t.i.d. and Protonix 40 mg p.o. daily, thiamine 100 mg p.o. daily,  metoprolol 50 mg p.o. b.i.d.   BRIEF HISTORY:  Devin [name] is a 50 year old gentleman who was admitted with  chest pain, you can see the dictation done by Dr. Michaelyn Barter, and also  a history of syncope where  an alcohol level at that time, where it found to  have a level of 238, and cardiology consult by Dr. Julieanne Manson.   HOSPITAL COURSE:  Cardiology was consulted for the chest pain where the  patient underwent myocardial perfusion studies, which are shown as above,  and for the high LFTs a hepatitis profile was done.  The  patient admitted  that he had a history of alcoholism on and off but does not like( alcohol  abuse.)  He refused to do any alcohol rehab or group.  LFTs were on the high  side, with AST 59 and ALT 39, alk phos 71 and total bilirubin was normal.   HOSPITAL COURSE FOR SYNCOPE:  Patient admitted to tele where a cardiac  monitor was negative.  Cardiology was consulted.  There is no obvious cause  for the syncope other than a high level of alcohol at that time.  Echo was  done which showed normal ejection fraction.  Left ventricular ejection  fraction estimated to be 75.  There was no diagnostic evidence of left  ventricular regional wall motion abnormalities and the right ventricular  wall thickness was mildly to moderately increased.  Further anemia workup  ended up with a ferritin level of 9, iron of 42, total iron binding capacity  of 432 and saturation of 10%.  Accordingly, patient was started on ferrous  sulfate 325  mg p.o. b.i.d. and folic acid.  The mild elevation of LFTs, alk  phos 71, AST 59, and ALT 39.  We did a hepatitis profile, which was  completely negative and ultrasound of the liver which was normal and we  presumed the diagnosis was due to alcoholism.  For the chest pain, it is  atypical chest pain and it was advised that if the chest pain continues to  be continuous, the patient has to be followed up with a gastroenterologist  for possible endoscopy as an outpatient.  On  discharge, the patient prescribed Protonix and folic acid, ferrous sulfate  and thiamine, and for the high blood pressure with evidence on the echo,  there is an obvious ventricular hypertrophy or thickening, metoprolol 50 mg  p.o. b.i.d. was prescribed to the patient.      Hind Bosie Helper, MD  Electronically Signed     HIE/MEDQ  D:  02/15/2006  T:  02/16/2006  Job:  [id number]
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
## 2010-09-16 NOTE — Cardiovascular Report (Signed)
NAMESAFIR, Devin [name]                ACCOUNT NO.:  [id number]   MEDICAL RECORD NO.:  [id number]          PATIENT TYPE:  INP   LOCATION:  3706                         FACILITY:  [facility]   PHYSICIAN:  Thereasa Solo. Little, M.D. DATE OF BIRTH:  20-Jan-1961   DATE OF PROCEDURE:  02/14/2006  DATE OF DISCHARGE:                              CARDIAC CATHETERIZATION   INDICATIONS FOR TEST:  This 50 year old male was admitted with elevated CK  with negative MBs; and a blood troponin.  He had been having chest  discomfort; and had a nuclear study performed that showed anterior ischemia  and because of this, is brought to the cath lab for cardiac catheterization.   After obtaining informed consent, the patient was prepped and draped in the  usual sterile fashion exposing the right groin.  Then following a local  anesthetic with 1% Xylocaine, the Seldinger technique was employed; and a 5-  [place] introducer sheath was placed into the right femoral artery.  Left-and-  right coronary arteriography, ventriculography in the RAO projection, and a  distal aortogram was performed.   COMPLICATIONS:  None.   TOTAL CONTRAST USED:  85 mL.   EQUIPMENT:  5-French Judkins configuration catheters.   RESULTS   HEMODYNAMIC MONITORING:  Central aortic pressure was 106/73.  Left  ventricular pressure was 110/0; and the left ventricular end-diastolic  pressure was 3.   VENTRICULOGRAPHY:  1. Ventriculography in the RAO projection using 20 mL of contrast at 12 mL      per second revealed normal LV systolic function with an ejection      fraction in excess of 60%.  The end-diastolic pressure was 3; and there      was mild LVH.  2. Distal aortogram:  Distal aortogram done at the level of the renals      using 25 mL at 12 mL per second showed no evidence of renal artery      stenosis.  No abdominal aortic aneurysm or iliac disease.   CORONARY ARTERIOGRAPHY:  On fluoroscopy no calcification was seen.   FINDINGS:  1.  Left main normal.  2. The LAD crossed the apex of the heart; and actually supplied the distal      half of the posterior septum.  There was a large first diagonal branch      and both the LAD and the diagonal were free of disease.  3. The circumflex was a large dominant vessel.  It gave rise to a large OM      vessel that was approximately 4 mm in diameter and free of disease.      The second, third, and fourth OM vessels were smaller and free of      disease.  The PDA was free of disease; and there was no disease in the      circumflex or in the AV groove.  4. Right coronary artery:  This was a small nondominant vessel free of      disease.   CONCLUSION:  1. No evidence of coronary disease in a left dominant system.  2. Normal LV systolic  function.  3. Mild LVH.  4. No renal artery stenosis or abdominal aortic aneurysm.   Because of the patient's social situation, he is limited; and is unable to  be in a situation where he can be inactive for the next 18 hours following  his cardiac catheterization.  As result of this he will need to stay in the  hospital overnight and be discharged in the morning.           ______________________________  Thereasa Solo. Little, M.D.     ABL/MEDQ  D:  02/14/2006  T:  02/15/2006  Job:  [id number]   cc:   Cath Lab  [facility]
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
## 2010-10-25 ENCOUNTER — Ambulatory Visit (HOSPITAL_COMMUNITY)
Admission: RE | Admit: 2010-10-25 | Discharge: 2010-10-25 | Disposition: A | Discharge status: Home / Self Care | Payer: Self-pay | Source: Ambulatory Visit | Attending: Internal Medicine | Admitting: Internal Medicine

## 2010-10-25 ENCOUNTER — Encounter: Payer: Self-pay | Admitting: Internal Medicine

## 2010-10-25 ENCOUNTER — Ambulatory Visit (INDEPENDENT_AMBULATORY_CARE_PROVIDER_SITE_OTHER): Payer: Self-pay | Admitting: Internal Medicine

## 2010-10-25 DIAGNOSIS — R9431 Abnormal electrocardiogram [ECG] [EKG]: Secondary | ICD-10-CM | POA: Insufficient documentation

## 2010-10-25 DIAGNOSIS — R079 Chest pain, unspecified: Secondary | ICD-10-CM

## 2010-10-25 DIAGNOSIS — I10 Essential (primary) hypertension: Secondary | ICD-10-CM

## 2010-10-25 MED ORDER — ASPIRIN 81 MG PO TBEC: 81.0000 mg | DELAYED_RELEASE_TABLET | Freq: Every day | ORAL | Status: DC

## 2010-10-25 MED ORDER — HYDROCHLOROTHIAZIDE 25 MG PO TABS: 25.0000 mg | ORAL_TABLET | Freq: Every day | ORAL | Status: DC

## 2010-10-25 MED ORDER — NITROGLYCERIN 0.3 MG SL SUBL: 0.3000 mg | SUBLINGUAL_TABLET | SUBLINGUAL | Status: DC | PRN

## 2010-10-25 MED ORDER — METOPROLOL TARTRATE 50 MG PO TABS: 50.0000 mg | ORAL_TABLET | Freq: Two times a day (BID) | ORAL | Status: DC

## 2010-10-25 MED ORDER — PRAVASTATIN SODIUM 20 MG PO TABS: 20.0000 mg | ORAL_TABLET | Freq: Every day | ORAL | Status: DC

## 2010-10-25 MED ORDER — RANITIDINE HCL 300 MG PO TABS: 300.0000 mg | ORAL_TABLET | Freq: Every day | ORAL | Status: DC

## 2010-10-25 NOTE — Progress Notes (Signed)
Addended by: Scot Dock, MADHAV V on: 10/25/2010 05:48 PM   Modules accepted: Orders

## 2010-10-25 NOTE — Assessment & Plan Note (Signed)
Pain is atypical given its been there for years. He does have significant risk factors including alcohol abuse, cigarette smoking, hypertension, tachycardia. Other differential for alcohol-induced gastritis, GERD, demand ischemia from tachycardia, anxiety. Plan EKG: Shows some T-wave flattening and inversion in V4 to V6 which was present in the old EKG, LVH with repolarization, sinus tachycardia. No significant new EKG changes. Given a prescription off metoprolol, HCTZ, Pepcid and strongly counseled to comply with his medications for 2 weeks. Also put in a referral for cardiology for outpatient Myoview, although it's unclear if he will follow up with them given his history of noncompliance in the past I will see him back in 2 weeks and if he still has this chest pain while taking these medications, might need an admission for further workup for ACS. Given normal catheterization 5 years ago it is unlikely to be severe ischemic chest pain.Dr. Aundria Rud agrees with the plan

## 2010-10-25 NOTE — Patient Instructions (Signed)
It is extremely important that you return and see the doctor in 2 weeks for  Persistence of chest pain. Take all your medicines during this time.

## 2010-10-25 NOTE — Progress Notes (Signed)
50 year old man with past medical history of alcohol abuse, hyperlipidemia, hypertension, atypical chest pain with a normal catheterization in 2007 and a history of MI in 2003 when he was at [facility] comes to the clinic complaining of chest pain. Pain is in the left lateral chest wall. In having it for a few weeks now. Not specifically getting worse. Feels like aching pain which is constant and worse with movement of his left arm. He also feels soreness when he presses on the lateral chest wall. He also describes occasional substernal chest pain. This pain is episodic which lasts about 5-10 seconds. It occurs multiple times in a day with no predilection to exertion. He feels short of breath and extremely anxious when this occurs. This has been going on for years. The pain does not radiate to his arm or back. No shortness of breath, dizziness, syncope or near syncope, diaphoresis, nausea or vomiting. He does have some acid reflux and occasionally experiences burning in his chest and stomach.  BP 133/90  Pulse 101  Temp(Src) 98.5 F (36.9 C) (Oral)  Ht 5\' 10"  (1.778 m)  Wt 157 lb 9.6 oz (71.487 kg)  BMI 22.61 kg/m2  SpO2 100%  General Appearance:    Alert, cooperative, no distress, appears stated age  Head:    Normocephalic, without obvious abnormality, atraumatic  Eyes:    PERRL, conjunctiva/corneas clear, EOM's intact, fundi    benign, both eyes       Back:     Symmetric, no curvature, ROM normal, no CVA tenderness  Lungs:     Clear to auscultation bilaterally, respirations unlabored  Chest wall:    No tenderness or deformity  Heart:    Regular rate and rhythm, S1 and S2 normal, no murmur, rub   or gallop  Abdomen:     Soft, non-tender, bowel sounds active all four quadrants,    no masses, no organomegaly  Extremities:   Extremities normal, atraumatic, no cyanosis or edema  Pulses:   2+ and symmetric all extremities  Skin:   Skin color, texture, turgor normal, no rashes or  lesions  Lymph nodes:   Cervical, supraclavicular, and axillary nodes normal  Neurologic:   CNII-XII intact. Normal strength, sensation and reflexes      throughout   Constitutional: Denies fever, chills, diaphoresis, appetite change and fatigue.  HEENT: Denies photophobia, eye pain, redness, hearing loss, ear pain, congestion, sore throat, rhinorrhea, sneezing, mouth sores, trouble swallowing, neck pain, neck stiffness and tinnitus.   Respiratory: Denies SOB, DOE, cough, chest tightness,  and wheezing.   Cardiovascular: His chest pain and palpitations and leg swelling.  Gastrointestinal: Denies nausea, vomiting, abdominal pain, diarrhea, constipation, blood in stool and abdominal distention.  Genitourinary: Denies dysuria, urgency, frequency, hematuria, flank pain and difficulty urinating.  Musculoskeletal: Denies myalgias, back pain, joint swelling, arthralgias and gait problem.  Skin: Denies pallor, rash and wound.  Neurological: Denies dizziness, seizures, syncope, weakness, light-headedness, numbness and headaches.  Hematological: Denies adenopathy. Easy bruising, personal or family bleeding history  Psychiatric/Behavioral: Denies suicidal ideation, mood changes, confusion, nervousness, sleep disturbance and agitation

## 2010-11-08 ENCOUNTER — Ambulatory Visit (INDEPENDENT_AMBULATORY_CARE_PROVIDER_SITE_OTHER): Payer: Self-pay | Admitting: Internal Medicine

## 2010-11-08 ENCOUNTER — Ambulatory Visit (HOSPITAL_COMMUNITY)
Admission: RE | Admit: 2010-11-08 | Discharge: 2010-11-08 | Disposition: A | Discharge status: Home / Self Care | Payer: Self-pay | Source: Ambulatory Visit | Attending: Internal Medicine | Admitting: Internal Medicine

## 2010-11-08 ENCOUNTER — Encounter: Payer: Self-pay | Admitting: Internal Medicine

## 2010-11-08 DIAGNOSIS — M549 Dorsalgia, unspecified: Secondary | ICD-10-CM

## 2010-11-08 DIAGNOSIS — R9431 Abnormal electrocardiogram [ECG] [EKG]: Secondary | ICD-10-CM | POA: Insufficient documentation

## 2010-11-08 DIAGNOSIS — G8929 Other chronic pain: Secondary | ICD-10-CM

## 2010-11-08 DIAGNOSIS — F1010 Alcohol abuse, uncomplicated: Secondary | ICD-10-CM

## 2010-11-08 DIAGNOSIS — R079 Chest pain, unspecified: Secondary | ICD-10-CM

## 2010-11-08 DIAGNOSIS — I10 Essential (primary) hypertension: Secondary | ICD-10-CM

## 2010-11-08 NOTE — Assessment & Plan Note (Addendum)
Pt denies Alcohol Abuse. Claims that he only drink occasionally. Average 3 bottles beer/month

## 2010-11-08 NOTE — Progress Notes (Signed)
Subjective:    Patient ID: Devin [name], male    DOB: 1961-01-18, 50 y.o.   MRN: [id number]  HPI This is a 50 yo man with PMH of chest pain, MI, PTCA, HTN and Alcohol Abuse presents to the clinic for follow up visit. Pt has no c/o, no chest pain or chest pressure,  No palpitation, No SOB or cough.   Pt reported that he did not take his medications except ASA due to the lack of money and no insurance. Pt stated that he knew Debra Hill(Finanacial counselor) contact information and did not follow up with her yet. EKG is done during this visit, no changes notes comparing to the one on last visit.  Past Medical History  Diagnosis Date  . Hypertension   . Hyperlipidemia   . Alcohol abuse   . MI (myocardial infarction) 2003    evaluated at [facility]  . Palpitation   . Inadequate material resources    History   Social History  . Marital Status: Single    Spouse Name: N/A    Number of Children: N/A  . Years of Education: N/A   Occupational History  . Not on file.   Social History Main Topics  . Smoking status: Current Everyday Smoker -- 10 years    Types: Cigarettes  . Smokeless tobacco: Never Used   Comment: pt only smoke 2-3 cigarettes /week  . Alcohol Use: 0.0 oz/week     aeveage 3 bottle beers/months  . Drug Use: Yes    Special: Marijuana     mariJuana 10 times/year  . Sexually Active: Not on file   Other Topics Concern  . Not on file   Social History Narrative   Financial assistance application initiated. Patient needs to submit further paperwork to complete.Per Rudell Cobb 02/18/2010   Current Outpatient Prescriptions on File Prior to Visit  Medication Sig Dispense Refill  . aspirin 81 MG EC tablet Take 1 tablet (81 mg total) by mouth daily.  31 tablet  1  . hydrochlorothiazide 25 MG tablet Take 1 tablet (25 mg total) by mouth daily.  30 tablet  12  . metoprolol (LOPRESSOR) 50 MG tablet Take 1 tablet (50 mg total) by mouth 2 (two) times daily.  60 tablet  30  .  nitroGLYCERIN (NITROSTAT) 0.3 MG SL tablet Place 1 tablet (0.3 mg total) under the tongue every 5 (five) minutes as needed for chest pain.  10 tablet  0  . pravastatin (PRAVACHOL) 20 MG tablet Take 1 tablet (20 mg total) by mouth daily.  31 tablet  1  . ranitidine (ZANTAC) 300 MG tablet Take 1 tablet (300 mg total) by mouth at bedtime.  31 tablet  1   No Known Allergies     Review of Systems  No headache, fever, or sore throat. No shortness of breath or dyspnea on exertion. No chest pain, chest pressure or palpitation. No nausea, vomiting, or abdominal pain. No melena, diarrhea or incontinence. No muscle weakness.                   Denies depression. No appetite or weight changes.      Objective:   Physical Exam General: alert, well-developed, and cooperative to examination.  Mouth: pharynx pink and moist. Lungs: normal respiratory effort, no accessory muscle use, normal breath sounds, no crackles, and no wheezes. Heart: normal rate, regular rhythm, no murmur, no gallop, and no rub.  Abdomen: soft, non-tender, normal bowel sounds, no distention, no guarding, no  rebound tenderness, no hepatomegaly, and no splenomegaly.  Msk: no joint swelling, no joint warmth, and no redness over joints.  Pulses: 2+ DP/PT pulses bilaterally Extremities: No cyanosis, clubbing, edema Neurologic: alert & oriented X3, cranial nerves II-XII intact, strength normal in all extremities, sensation intact to light touch, and gait normal.  Skin: turgor normal and no rashes.  Psych: Oriented X3, memory intact for recent and remote, normally interactive, good eye contact, not anxious appearing, and not depressed appearing.      Assessment & Plan:

## 2010-11-08 NOTE — Assessment & Plan Note (Signed)
Mildly elevated BP, No other c/o, refer to the note on Chest pain
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
## 2010-11-08 NOTE — Assessment & Plan Note (Addendum)
Pt stated that he feels fine. No c/o chest pain, chest pressure, or palpitation. No c/o SOD, Dyspnea on exertion. No c/o pain. EKG done without any changes comparing the previous EKG from last visit.  Pt stated that he has not been taking any of his medications except ASA due to lack of money and no insurance.   Pt was provided the [facility] contact information during last visit for the application of orange card and medication assistance plan. It was unclear as to the reason why pt never followed up.    -Pt was provided the option of combing his HCTZ and metoprolol together and switching his med to Atenolol-Chlorthalidone 100/25 mg po tab, which is only $4 at [facility].  Pt initially agreed with the plan, then 10 minutes later, pt became louder and stated that he did not want his medications to be switched and he would get money to fill up his prescription either today or tomorrow.  -Discussed with pt about consequences on not taking his medications, such as an new MI, Chest Pain, high BP, other Cardiovascular problems. Pt understands above education. Verbalized that he appreciated our service today.  -Will arrange cardiology referral

## 2010-11-08 NOTE — Patient Instructions (Addendum)
1. Please follow up with  Jaynee Eagles to set up your [facility] card and medication assistance plan. Research scientist (physical sciences)) 2. Please fill up all your medications ASAP to get on your treatment ( pt stated that he would fill up his meds either today or tomorrow.) 3. Will arrange the Cardiology consult. 4. Will arrange the follow up visit in 2 weeks.  7. Please go to ER if you experience any chest pain or chest pressure

## 2010-11-14 ENCOUNTER — Ambulatory Visit: Payer: Self-pay | Admitting: Licensed Clinical Social Worker

## 2010-11-14 DIAGNOSIS — Z591 Inadequate housing: Secondary | ICD-10-CM
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
## 2010-11-14 NOTE — Progress Notes (Signed)
Addended byDierdre Searles, Levy Wellman on: 11/14/2010 09:30 AM   Modules accepted: Orders

## 2010-11-28 ENCOUNTER — Ambulatory Visit (INDEPENDENT_AMBULATORY_CARE_PROVIDER_SITE_OTHER): Payer: Self-pay | Admitting: Internal Medicine

## 2010-11-28 ENCOUNTER — Encounter: Payer: Self-pay | Admitting: Internal Medicine

## 2010-11-28 DIAGNOSIS — I10 Essential (primary) hypertension: Secondary | ICD-10-CM

## 2010-11-28 DIAGNOSIS — E785 Hyperlipidemia, unspecified: Secondary | ICD-10-CM

## 2010-11-28 MED ORDER — RANITIDINE HCL 300 MG PO TABS: 300.0000 mg | ORAL_TABLET | Freq: Every day | ORAL | Status: DC

## 2010-11-28 MED ORDER — PRAVASTATIN SODIUM 20 MG PO TABS: 20.0000 mg | ORAL_TABLET | Freq: Every day | ORAL | Status: DC

## 2010-11-28 MED ORDER — ASPIRIN 81 MG PO TBEC: 81.0000 mg | DELAYED_RELEASE_TABLET | Freq: Every day | ORAL | Status: DC

## 2010-11-28 MED ORDER — ATENOLOL-CHLORTHALIDONE 50-25 MG PO TABS: 1.0000 | ORAL_TABLET | Freq: Every day | ORAL | Status: DC

## 2010-11-28 NOTE — Patient Instructions (Signed)
Follow-up in 2 weeks

## 2010-11-29 ENCOUNTER — Other Ambulatory Visit: Payer: Self-pay | Admitting: *Deleted

## 2010-11-29 MED ORDER — ATENOLOL-CHLORTHALIDONE 50-25 MG PO TABS: 1.0000 | ORAL_TABLET | Freq: Every day | ORAL | Status: DC

## 2010-11-29 NOTE — Assessment & Plan Note (Signed)
Will change his medications to a combination pill of atenolol and chlorthalidone based on last office note which seems to be a good idea for him as it would be a cheaper alternative and he would have to take just one medication improving compliance Reassess in 2 weeks once he starts taking his medications to see if it's controlling his blood pressure

## 2010-11-29 NOTE — Telephone Encounter (Signed)
Pharmacy does not carry the Chlorthalidone.  Would you like to order something else.  Call the [facility] at [phone]

## 2010-11-29 NOTE — Assessment & Plan Note (Signed)
He will start taking his medications today. Repeat lipid profile in 2 months.

## 2010-11-29 NOTE — Progress Notes (Signed)
50 year old homeless man with past medical history of alcohol abuse, hypertension, hyperlipidemia, coronary artery disease and sinus tachycardia comes to the clinic for follow. He does not have any new complaints today. he has not been taking any of his medications because of financial problems. He states that he has got his orange card now and is going to start taking his medications. Denies chest pain, shortness of breath, leg edema, palpitations or other complaints.  Constitutional: Denies fever, chills, diaphoresis, appetite change and fatigue.  HEENT: Denies photophobia, eye pain, redness, hearing loss, ear pain, congestion, sore throat, rhinorrhea, sneezing, mouth sores, trouble swallowing, neck pain, neck stiffness and tinnitus.   Respiratory: Denies SOB, DOE, cough, chest tightness,  and wheezing.   Cardiovascular: Denies chest pain, palpitations and leg swelling.  Gastrointestinal: Denies nausea, vomiting, abdominal pain, diarrhea, constipation, blood in stool and abdominal distention.  Genitourinary: Denies dysuria, urgency, frequency, hematuria, flank pain and difficulty urinating.  Musculoskeletal: Denies myalgias, back pain, joint swelling, arthralgias and gait problem.  Skin: Denies pallor, rash and wound.  Neurological: Denies dizziness, seizures, syncope, weakness, light-headedness, numbness and headaches.  Hematological: Denies adenopathy. Easy bruising, personal or family bleeding history  Psychiatric/Behavioral: Denies suicidal ideation, mood changes, confusion, nervousness, sleep disturbance and agitation  PE  Constitutional: Vital signs reviewed.  Patient is a well-developed and well-nourished in no acute distress and cooperative with exam. Alert and oriented x3.  Head: Normocephalic and atraumatic Ear: TM normal bilaterally Mouth: no erythema or exudates, MMM Eyes: PERRL, EOMI, conjunctivae normal, No scleral icterus.  Neck: Supple, Trachea midline normal ROM, No JVD,  mass, thyromegaly, or carotid bruit present.  Cardiovascular: RRR, S1 normal, S2 normal, no MRG, pulses symmetric and intact bilaterally Pulmonary/Chest: CTAB, no wheezes, rales, or rhonchi Abdominal: Soft. Non-tender, non-distended, bowel sounds are normal, no masses, organomegaly, or guarding present.  GU: no CVA tenderness Musculoskeletal: No joint deformities, erythema, or stiffness, ROM full and no nontender Hematology: no cervical, inginal, or axillary adenopathy.  Neurological: A&O x3, Strenght is normal and symmetric bilaterally, cranial nerve II-XII are grossly intact, no focal motor deficit, sensory intact to light touch bilaterally.  Skin: Warm, dry and intact. No rash, cyanosis, or clubbing.  Psychiatric: Normal mood and affect. speech and behavior is normal. Judgment and thought content normal. Cognition and memory are normal.

## 2010-12-01 ENCOUNTER — Other Ambulatory Visit: Payer: Self-pay | Admitting: Internal Medicine

## 2010-12-01 MED ORDER — METOPROLOL TARTRATE 50 MG PO TABS: 50.0000 mg | ORAL_TABLET | Freq: Two times a day (BID) | ORAL | Status: DC

## 2010-12-01 MED ORDER — HYDROCHLOROTHIAZIDE 25 MG PO TABS: 25.0000 mg | ORAL_TABLET | Freq: Every day | ORAL | Status: DC

## 2010-12-02 NOTE — Telephone Encounter (Signed)
Med change faxed to the [facility].

## 2010-12-13 ENCOUNTER — Encounter: Payer: Self-pay | Admitting: Internal Medicine

## 2010-12-23 ENCOUNTER — Telehealth: Payer: Self-pay | Admitting: *Deleted

## 2010-12-23 DIAGNOSIS — K219 Gastro-esophageal reflux disease without esophagitis: Secondary | ICD-10-CM

## 2010-12-23 MED ORDER — OMEPRAZOLE 40 MG PO CPDR: 40.0000 mg | DELAYED_RELEASE_CAPSULE | Freq: Every day | ORAL | Status: DC

## 2010-12-23 NOTE — Telephone Encounter (Signed)
done

## 2010-12-23 NOTE — Telephone Encounter (Signed)
[facility] is [facility] the [facility] waiting... [facility] not carry zantac may they change to omeprazole? If so please change med list. They are paging you now

## 2011-01-03 ENCOUNTER — Encounter: Payer: Self-pay | Admitting: Cardiovascular Disease

## 2011-01-03 ENCOUNTER — Ambulatory Visit (INDEPENDENT_AMBULATORY_CARE_PROVIDER_SITE_OTHER): Payer: Self-pay | Admitting: Cardiovascular Disease

## 2011-01-03 VITALS — BP 100/60 | HR 70 | Ht 70.0 in | Wt 154.0 lb

## 2011-01-03 DIAGNOSIS — R002 Palpitations: Secondary | ICD-10-CM

## 2011-01-03 DIAGNOSIS — R079 Chest pain, unspecified: Secondary | ICD-10-CM

## 2011-01-03 DIAGNOSIS — E785 Hyperlipidemia, unspecified: Secondary | ICD-10-CM

## 2011-01-03 DIAGNOSIS — I10 Essential (primary) hypertension: Secondary | ICD-10-CM

## 2011-01-03 NOTE — Assessment & Plan Note (Signed)
Well controlled.  Continue current medications and low sodium Dash type diet.    
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
## 2011-01-03 NOTE — Patient Instructions (Signed)
Your physician has requested that you have an echocardiogram. Echocardiography is a painless test that uses sound waves to create images of your heart. It provides your doctor with information about the size and shape of your heart and how well your heart's chambers and valves are working. This procedure takes approximately one hour. There are no restrictions for this procedure.   

## 2011-01-03 NOTE — Assessment & Plan Note (Signed)
Resolved.  R/O in hospital No documented CAD.  D/C Plavix he was never able to afford it anyway  ASA

## 2011-01-03 NOTE — Assessment & Plan Note (Signed)
Benign check echo to make sure EF normal

## 2011-01-03 NOTE — Assessment & Plan Note (Signed)
Cholesterol is at goal.  Continue current dose of statin and diet Rx.  No myalgias or side effects.  F/U  LFT's in 6 months. Lab Results  Component Value Date   LDLCALC 74 02/09/2010

## 2011-01-11 ENCOUNTER — Other Ambulatory Visit (HOSPITAL_COMMUNITY): Payer: Self-pay | Admitting: Radiology

## 2011-01-20 LAB — ETHANOL: Alcohol, Ethyl (B): 345 — ABNORMAL HIGH

## 2011-02-03 LAB — POCT I-STAT, CHEM 8
Chloride: 103 mEq/L (ref 96–112)
Creatinine, Ser: 1.3 mg/dL (ref 0.4–1.5)
Glucose, Bld: 107 mg/dL — ABNORMAL HIGH (ref 70–99)
HCT: 44 % (ref 39.0–52.0)
Hemoglobin: 15 g/dL (ref 13.0–17.0)
Hemoglobin: 15.3 g/dL (ref 13.0–17.0)
Potassium: 3.5 mEq/L (ref 3.5–5.1)
Potassium: 3.6 mEq/L (ref 3.5–5.1)
Sodium: 139 mEq/L (ref 135–145)
Sodium: 140 mEq/L (ref 135–145)

## 2011-02-03 LAB — PROTIME-INR
INR: 1.1 (ref 0.00–1.49)
Prothrombin Time: 14.8 seconds (ref 11.6–15.2)

## 2011-02-03 LAB — CBC
MCHC: 32.2 g/dL (ref 30.0–36.0)
RBC: 4.3 MIL/uL (ref 4.22–5.81)
RDW: 17.2 % — ABNORMAL HIGH (ref 11.5–15.5)

## 2011-02-03 LAB — POCT CARDIAC MARKERS
CKMB, poc: 27.8 ng/mL (ref 1.0–8.0)
Myoglobin, poc: >500 ng/mL (ref 12–200)
Troponin i, poc: <0.05 ng/mL (ref 0.00–0.09)

## 2011-02-06 LAB — DIFFERENTIAL
Eosinophils Absolute: 0.1 — ABNORMAL LOW
Eosinophils Relative: 2
Lymphocytes Relative: 31
Lymphs Abs: 1.7
Monocytes Absolute: 0.5

## 2011-02-06 LAB — CBC
MCV: 91.5
Platelets: 421 — ABNORMAL HIGH
RBC: 4.32
WBC: 5.5

## 2011-02-06 LAB — COMPREHENSIVE METABOLIC PANEL
ALT: 37
AST: 67 — ABNORMAL HIGH
Albumin: 3.9
CO2: 22
Calcium: 8.8
Chloride: 106
Creatinine, Ser: 0.83
GFR calc Af Amer: >60
GFR calc non Af Amer: >60
Sodium: 139
Total Bilirubin: 0.8

## 2011-02-06 LAB — URINALYSIS, ROUTINE W REFLEX MICROSCOPIC
Bilirubin Urine: NEGATIVE
Ketones, ur: NEGATIVE
Nitrite: NEGATIVE
Protein, ur: NEGATIVE
Specific Gravity, Urine: 1.009
Urobilinogen, UA: 0.2

## 2011-02-06 LAB — RAPID URINE DRUG SCREEN, HOSP PERFORMED
Amphetamines: NOT DETECTED
Benzodiazepines: NOT DETECTED
Tetrahydrocannabinol: NOT DETECTED

## 2011-02-06 LAB — ETHANOL: Alcohol, Ethyl (B): 478

## 2011-02-06 LAB — LIPASE, BLOOD: Lipase: 29

## 2011-06-27 ENCOUNTER — Encounter: Payer: Self-pay | Admitting: Internal Medicine

## 2011-08-02 ENCOUNTER — Encounter: Payer: Self-pay | Admitting: Internal Medicine

## 2011-08-02 ENCOUNTER — Ambulatory Visit (INDEPENDENT_AMBULATORY_CARE_PROVIDER_SITE_OTHER): Payer: Self-pay | Admitting: Internal Medicine

## 2011-08-02 VITALS — BP 145/92 | HR 108 | Temp 97.5°F | Ht 70.75 in | Wt 156.7 lb

## 2011-08-02 DIAGNOSIS — I10 Essential (primary) hypertension: Secondary | ICD-10-CM

## 2011-08-02 DIAGNOSIS — Z0271 Encounter for disability determination: Secondary | ICD-10-CM

## 2011-08-02 MED ORDER — SILDENAFIL CITRATE 50 MG PO TABS: 50.0000 mg | ORAL_TABLET | ORAL | Status: DC | PRN

## 2011-08-02 NOTE — Patient Instructions (Signed)
Needs an appointment to see the social worker

## 2011-08-02 NOTE — Assessment & Plan Note (Signed)
Patient is not taking any medications at all He is refusing all labs today Will refer him to social work for disability assessment Once that is sorted out, will try to get him on good regimen for his other medical problem

## 2011-08-02 NOTE — Progress Notes (Signed)
Patient ID: Devin [name], male   DOB: 06/30/1960, 51 y.o.   MRN: [id number]  51 y/o m with pmh listed below comes for Social security paperowork He is not taking any medications that are prescribed He is not ready to listed to counseling about medications compliance and all he wants today is a letter saying he is disabled  He does not have any complaints   Physical exam   General Appearance:     Filed Vitals:   08/02/11 1502  BP: 145/92  Pulse: 108  Temp: 97.5 F (36.4 C)  TempSrc: Oral  Height: 5' 10.75" (1.797 m)  Weight: 156 lb 11.2 oz (71.079 kg)     Alert, cooperative, no distress, appears stated age  Head:    Normocephalic, without obvious abnormality, atraumatic  Eyes:    PERRL, conjunctiva/corneas clear, EOM's intact, fundi    benign, both eyes       Neck:   Supple, symmetrical, trachea midline, no adenopathy;       thyroid:  No enlargement/tenderness/nodules; no carotid   bruit or JVD  Lungs:     Clear to auscultation bilaterally, respirations unlabored  Chest wall:    No tenderness or deformity  Heart:    Tachycardia,  S1 and S2 normal, no murmur, rub   or gallop  Abdomen:     Soft, non-tender, bowel sounds active all four quadrants,    no masses, no organomegaly  Extremities:   Extremities normal, atraumatic, no cyanosis or edema  Pulses:   2+ and symmetric all extremities  Skin:   Skin color, texture, turgor normal, no rashes or lesions  Neurologic:  nonfocal grossly   ROS  Constitutional: Denies fever, chills, diaphoresis, appetite change and fatigue.  Respiratory: Denies SOB, DOE, cough, chest tightness,  and wheezing.   Cardiovascular: Denies chest pain, palpitations and leg swelling.  Gastrointestinal: Denies nausea, vomiting, abdominal pain, diarrhea, constipation, blood in stool and abdominal distention.  Skin: Denies pallor, rash and wound.  Neurological: Denies dizziness, light-headedness, numbness and headaches.

## 2011-08-07 ENCOUNTER — Telehealth: Payer: Self-pay | Admitting: Licensed Clinical Social Worker

## 2011-08-07 NOTE — Telephone Encounter (Signed)
CSW received referral to assist pt with Disability application.  CSW attempted to call pt at both home and mobile number.  CSW left msg at home number requesting return call, unable to leave msg at mobile number.  CSW will provide pt with information on the Disability Application Process.

## 2011-08-15 NOTE — Telephone Encounter (Signed)
CSW left message at home number requesting return call.

## 2011-08-22 NOTE — Telephone Encounter (Signed)
Letter and information placed in the mail.

## 2011-08-29 ENCOUNTER — Encounter: Payer: Self-pay | Admitting: Internal Medicine

## 2011-11-29 ENCOUNTER — Ambulatory Visit (INDEPENDENT_AMBULATORY_CARE_PROVIDER_SITE_OTHER): Payer: Self-pay | Admitting: Internal Medicine

## 2011-11-29 ENCOUNTER — Encounter: Payer: Self-pay | Admitting: Internal Medicine

## 2011-11-29 VITALS — BP 151/96 | HR 130 | Temp 97.8°F | Ht 70.75 in | Wt 153.6 lb

## 2011-11-29 DIAGNOSIS — K219 Gastro-esophageal reflux disease without esophagitis: Secondary | ICD-10-CM

## 2011-11-29 DIAGNOSIS — I10 Essential (primary) hypertension: Secondary | ICD-10-CM

## 2011-11-29 DIAGNOSIS — R079 Chest pain, unspecified: Secondary | ICD-10-CM

## 2011-11-29 DIAGNOSIS — Z0000 Encounter for general adult medical examination without abnormal findings: Secondary | ICD-10-CM

## 2011-11-29 DIAGNOSIS — E785 Hyperlipidemia, unspecified: Secondary | ICD-10-CM

## 2011-11-29 DIAGNOSIS — M549 Dorsalgia, unspecified: Secondary | ICD-10-CM | POA: Insufficient documentation

## 2011-11-29 LAB — LIPID PANEL
HDL: 110 mg/dL (ref >39)
LDL Cholesterol: 117 mg/dL — ABNORMAL HIGH (ref 0–99)
Triglycerides: 108 mg/dL (ref <150)
VLDL: 22 mg/dL (ref 0–40)

## 2011-11-29 MED ORDER — NITROGLYCERIN 0.3 MG SL SUBL: 0.3000 mg | SUBLINGUAL_TABLET | SUBLINGUAL | Status: DC | PRN

## 2011-11-29 MED ORDER — ATENOLOL 50 MG PO TABS: 50.0000 mg | ORAL_TABLET | Freq: Every day | ORAL | Status: DC

## 2011-11-29 MED ORDER — OMEPRAZOLE 40 MG PO CPDR: 40.0000 mg | DELAYED_RELEASE_CAPSULE | Freq: Every day | ORAL | Status: DC

## 2011-11-29 MED ORDER — HYDROCHLOROTHIAZIDE 25 MG PO TABS: 25.0000 mg | ORAL_TABLET | Freq: Every day | ORAL | Status: DC

## 2011-11-29 MED ORDER — PRAVASTATIN SODIUM 20 MG PO TABS: 20.0000 mg | ORAL_TABLET | Freq: Every day | ORAL | Status: DC

## 2011-11-29 MED ORDER — ASPIRIN 81 MG PO TBEC: 81.0000 mg | DELAYED_RELEASE_TABLET | Freq: Every day | ORAL | Status: DC

## 2011-11-29 MED ORDER — CYCLOBENZAPRINE HCL 5 MG PO TABS: 5.0000 mg | ORAL_TABLET | Freq: Three times a day (TID) | ORAL | Status: DC | PRN

## 2011-11-29 NOTE — Patient Instructions (Signed)
1. Please take all medications as prescribed.  2. If you have worsening of your symptoms or new symptoms arise, please call the clinic [phone]), or go to the ER immediately if symptoms are severe.   Chest Pain (Nonspecific) It is often hard to give a specific diagnosis for the cause of chest pain. There is always a chance that your pain could be related to something serious, such as a heart attack or a blood clot in the lungs. You need to follow up with your caregiver for further evaluation. CAUSES   Heartburn.   Pneumonia or bronchitis.   Anxiety or stress.   Inflammation around your heart (pericarditis) or lung (pleuritis or pleurisy).   A blood clot in the lung.   A collapsed lung (pneumothorax). It can develop suddenly on its own (spontaneous pneumothorax) or from injury (trauma) to the chest.   Shingles infection (herpes zoster virus).  The chest wall is composed of bones, muscles, and cartilage. Any of these can be the source of the pain.  The bones can be bruised by injury.   The muscles or cartilage can be strained by coughing or overwork.   The cartilage can be affected by inflammation and become sore (costochondritis).  DIAGNOSIS  Lab tests or other studies, such as X-rays, electrocardiography, stress testing, or cardiac imaging, may be needed to find the cause of your pain.  TREATMENT   Treatment depends on what may be causing your chest pain. Treatment may include:   Acid blockers for heartburn.   Anti-inflammatory medicine.   Pain medicine for inflammatory conditions.   Antibiotics if an infection is present.   You may be advised to change lifestyle habits. This includes stopping smoking and avoiding alcohol, caffeine, and chocolate.   You may be advised to keep your head raised (elevated) when sleeping. This reduces the chance of acid going backward from your stomach into your esophagus.   Most of the time, nonspecific chest pain will improve within 2 to  3 days with rest and mild pain medicine.  HOME CARE INSTRUCTIONS   If antibiotics were prescribed, take your antibiotics as directed. Finish them even if you start to feel better.   For the next few days, avoid physical activities that bring on chest pain. Continue physical activities as directed.   Do not smoke.   Avoid drinking alcohol.   Only take over-the-counter or prescription medicine for pain, discomfort, or fever as directed by your caregiver.   Follow your caregiver's suggestions for further testing if your chest pain does not go away.   Keep any follow-up appointments you made. If you do not go to an appointment, you could develop lasting (chronic) problems with pain. If there is any problem keeping an appointment, you must call to reschedule.  SEEK MEDICAL CARE IF:   You think you are having problems from the medicine you are taking. Read your medicine instructions carefully.   Your chest pain does not go away, even after treatment.   You develop a rash with blisters on your chest.  SEEK IMMEDIATE MEDICAL CARE IF:   You have increased chest pain or pain that spreads to your arm, neck, jaw, back, or abdomen.   You develop shortness of breath, an increasing cough, or you are coughing up blood.   You have severe back or abdominal pain, feel nauseous, or vomit.   You develop severe weakness, fainting, or chills.   You have a fever.  THIS IS AN EMERGENCY. Do not wait  to see if the pain will go away. Get medical help at once. Call your local emergency services (911 in [place] Do not drive yourself to the hospital. MAKE SURE YOU:   Understand these instructions.   Will watch your condition.   Will get help right away if you are not doing well or get worse.  Document Released: 01/25/2005 Document Revised: 04/06/2011 Document Reviewed: 11/21/2007 [facility] Patient Information 2012 [place], [place].

## 2011-11-29 NOTE — Assessment & Plan Note (Addendum)
Patient's chest is concerning for stable angina. He has a history of MI on 2003. His had a Myoview on 2007, which showed anteroseptal wall ischemia. Currently his chest pain is exertional and is associated with shortness of breath and palpitation.  -Will give him referral to cardiology for possible stress test -Will restart his medications, including atenolol 50 mg daily, hydrochlorothiazide 25 mg daily, ASA 81 mg daily,  pravastatin 20 mg daily and nitroglycerin when necessary. -Will followup in one month. -Patient will see social work, Rudell Cobb for applying orange card today.

## 2011-11-29 NOTE — Progress Notes (Signed)
Patient ID: Devin [name], male   DOB: 1960-07-01, 51 y.o.   MRN: [id number]  Subjective:   Patient ID: Devin [name] male   DOB: July 08, 1960 51 y.o.   MRN: [id number]  HPI: Devin [name] is a 51 y.o. past medical history as outlined below, who presents for a followup visit.  Patient reports that he did not take any of his medications for more than 3 months because of lack of insurance. He has empty medication bottles with him, including atenolol 50 mg daily, hydrochlorothiazide 25 mg daily, pravastatin 20 mg daily and omeprazole 20 mg daily. Patient reports that he has chronic chest pain. His chest is located in the substernal area, intermittent, pressure-like and nonradiating. It happens 2-3 times a week and lasts for a few minutes each time. It is associated with shortness of breath and palpitation when it happens. It is aggravated by exertion or walking, but not by deep breath.  Patient also reports having chronic back pain since 2005. His back pain is located at the bilateral paraspinal areas. It is aching, 2/10 in severity, constant and nonradiating. He does not have weakness, numbness or decreased sensations in his extremities. He did not have urinary incontinence or lose control of bowel moments.  Denies fever, chills, headaches,  cough, abdominal pain, diarrhea, constipation, dysuria, urgency, frequency, hematuria.    Past Medical History  Diagnosis Date  . Hypertension   . Hyperlipidemia   . Alcohol abuse   . MI (myocardial infarction) 2003    evaluated at [facility]  . Palpitation   . Inadequate material resources     homeless at this point,    Current Outpatient Prescriptions  Medication Sig Dispense Refill  . aspirin 81 MG EC tablet Take 1 tablet (81 mg total) by mouth daily.  31 tablet  5  . atenolol (TENORMIN) 50 MG tablet Take 1 tablet (50 mg total) by mouth daily.  30 tablet  5  . cyclobenzaprine (FLEXERIL) 5 MG tablet Take 1 tablet (5 mg total) by mouth every 8 (eight)  hours as needed for muscle spasms.  30 tablet  1  . hydrochlorothiazide (HYDRODIURIL) 25 MG tablet Take 1 tablet (25 mg total) by mouth daily.  31 tablet  5  . nitroGLYCERIN (NITROSTAT) 0.3 MG SL tablet Place 1 tablet (0.3 mg total) under the tongue every 5 (five) minutes as needed for chest pain.  20 tablet  0  . omeprazole (PRILOSEC) 40 MG capsule Take 1 capsule (40 mg total) by mouth daily.  30 capsule  5  . pravastatin (PRAVACHOL) 20 MG tablet Take 1 tablet (20 mg total) by mouth daily.  31 tablet  5  . sildenafil (VIAGRA) 50 MG tablet Take 1 tablet (50 mg total) by mouth as needed for erectile dysfunction.  20 tablet  1  . DISCONTD: hydrochlorothiazide 25 MG tablet Take 1 tablet (25 mg total) by mouth daily.  31 tablet  3  . DISCONTD: omeprazole (PRILOSEC) 40 MG capsule Take 1 capsule (40 mg total) by mouth daily.  30 capsule  1  . DISCONTD: pravastatin (PRAVACHOL) 20 MG tablet Take 1 tablet (20 mg total) by mouth daily.  31 tablet  1   No family history on file. History   Social History  . Marital Status: Single    Spouse Name: N/A    Number of Children: N/A  . Years of Education: N/A   Social History Main Topics  . Smoking status: Current Some Day Smoker -- 0.1  packs/day for 10 years    Types: Cigarettes  . Smokeless tobacco: Never Used   Comment: pt only smoke 2-3 cigarettes /week  . Alcohol Use: 0.0 oz/week     aeveage 3 bottle beers/months  . Drug Use: Yes    Special: Marijuana     mariJuana 10 times/year  . Sexually Active: Not on file   Other Topics Concern  . Not on file   Social History Narrative   Financial assistance application initiated. Patient needs to submit further paperwork to complete.Per Rudell Cobb 02/18/2010   Review of Systems:  General: no fevers, chills, no changes in body weight, no changes in appetite Skin: no rash HEENT: no blurry vision, hearing changes or sore throat Pulm: No coughing, wheezing. Has SOB. CV: Has chest pain,  palpitations Abd: no nausea/vomiting, abdominal pain, diarrhea/constipation GU: no dysuria, hematuria, polyuria Ext: no arthralgias, myalgias Musculoskeletal: Has back pain  Neuro: no weakness, numbness, or tingling   Objective:  Physical Exam: Filed Vitals:   11/29/11 1354  BP: 151/96  Pulse: 130  Temp: 97.8 F (36.6 C)  TempSrc: Oral  Height: 5' 10.75" (1.797 m)  Weight: 153 lb 9.6 oz (69.673 kg)  SpO2: 100%   General: resting in bed, not in acute distress HEENT: PERRL, EOMI, no scleral icterus Cardiac: S1/S2, RRR, No murmurs, gallops or rubs. Has tachycardia.  Pulm: Good air movement bilaterally, Clear to auscultation bilaterally, No rales, wheezing, rhonchi or rubs. Abd: Soft,  nondistended, nontender, no rebound pain, no organomegaly, BS present Ext: No rashes or edema, 2+DP/PT pulse bilaterally Musculoskeletal: No joint deformities, erythema, or stiffness. There is tenderness over paraspinal areas of lower back bilaterally. There is no tenderness over the midline of spine.  Skin: no rashes. No skin bruise. Neuro: alert and oriented X3, cranial nerves II-XII grossly intact, muscle strength 5/5 in all extremeties,  sensation to light touch intact. Leg raise test is negative. Psych.: patient is not psychotic, no suicidal or hemocidal ideation.   Assessment & Plan:

## 2011-11-29 NOTE — Assessment & Plan Note (Signed)
His back pain is mild. The back pain is located at the paraspinal areas. It is most likely due to paraspinal muscle straining. He does not have any tenderness over the midline of spine. There is no signs of sciatic nerve compression. There is no alarming symptoms, such as weakness, numbness or decreased sensations in his extremities.  -will treat with Flexeril -Patient is also advised to take over-the-counter Advil.

## 2011-11-29 NOTE — Assessment & Plan Note (Signed)
-  Patient would like to postpone colonoscopy until he gets orange card -Influenza vaccination is updated.

## 2011-11-29 NOTE — Assessment & Plan Note (Addendum)
His blood pressure is 151/96. It is most likely due to noncompliance to his medications. He did not take his blood pressure medications for more than 3 months.   -will restart his medications, including atenolol 50 mg daily and hydrochlorothiazide 25 mg daily. -will followup in one month -will not to check his BMP today, since he did not take his medications for more than 3 months.

## 2011-11-29 NOTE — Assessment & Plan Note (Signed)
He did not take his pravastatin for more than 3 months. We'll check his lipid profile. We'll restart his pravastatin today.

## 2011-12-05 ENCOUNTER — Other Ambulatory Visit: Payer: Self-pay | Admitting: *Deleted

## 2011-12-05 NOTE — Telephone Encounter (Signed)
All meds filled by Dr Clyde Lundborg.

## 2011-12-12 ENCOUNTER — Encounter: Payer: Self-pay | Admitting: Internal Medicine

## 2011-12-26 ENCOUNTER — Encounter: Payer: Self-pay | Admitting: Internal Medicine

## 2011-12-28 ENCOUNTER — Encounter: Payer: Self-pay | Admitting: Internal Medicine

## 2011-12-28 ENCOUNTER — Ambulatory Visit (INDEPENDENT_AMBULATORY_CARE_PROVIDER_SITE_OTHER): Payer: Self-pay | Admitting: Internal Medicine

## 2011-12-28 ENCOUNTER — Telehealth: Payer: Self-pay | Admitting: *Deleted

## 2011-12-28 VITALS — BP 136/90 | HR 90 | Temp 97.6°F | Resp 20 | Ht 69.5 in | Wt 157.2 lb

## 2011-12-28 DIAGNOSIS — E785 Hyperlipidemia, unspecified: Secondary | ICD-10-CM

## 2011-12-28 DIAGNOSIS — M549 Dorsalgia, unspecified: Secondary | ICD-10-CM

## 2011-12-28 DIAGNOSIS — Z23 Encounter for immunization: Secondary | ICD-10-CM

## 2011-12-28 DIAGNOSIS — F1010 Alcohol abuse, uncomplicated: Secondary | ICD-10-CM

## 2011-12-28 DIAGNOSIS — I10 Essential (primary) hypertension: Secondary | ICD-10-CM

## 2011-12-28 DIAGNOSIS — Z0000 Encounter for general adult medical examination without abnormal findings: Secondary | ICD-10-CM

## 2011-12-28 MED ORDER — PRAVASTATIN SODIUM 40 MG PO TABS: 40.0000 mg | ORAL_TABLET | Freq: Every day | ORAL | Status: DC

## 2011-12-28 NOTE — Assessment & Plan Note (Signed)
Reports occasional alcohol use on special occasions, proximately 3-4 glasses of beer at a time.
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
## 2011-12-28 NOTE — Assessment & Plan Note (Signed)
LDL 1 month ago was 117. Given patient's history of myocardial infarction, patient LDL goal of less than 70. Increase pravastatin to 40 mg daily before bed.

## 2011-12-28 NOTE — Assessment & Plan Note (Signed)
-  tdap today

## 2011-12-28 NOTE — Patient Instructions (Signed)
-  Keep taking all of your medications - be sure the pravastatin you are taking is 40mg  before bed.  -We gave you a tetanus shot today, so you will be due for another one in 10 years.    Please be sure to bring all of your medications with you to every visit.  Should you have any new or worsening symptoms, please be sure to call the clinic at [phone].
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
## 2011-12-28 NOTE — Assessment & Plan Note (Signed)
Lab Results  Component Value Date   NA 141 08/13/2010   K 4.0 08/13/2010   CL 109 08/13/2010   CO2 22 08/13/2010   BUN 15 08/13/2010   CREATININE 0.83 08/13/2010   CREATININE 1.07 06/08/2010    BP Readings from Last 3 Encounters:  12/28/11 136/90  11/29/11 151/96  08/02/11 145/92    Assessment: Hypertension control:  mildly elevated  Progress toward goals:  improved Barriers to meeting goals:  financial need and lack of understanding of disease management  Plan: Hypertension treatment:  continue current medications - atenolol 50 mg daily, hydrochlorothiazide 25 mg daily

## 2011-12-28 NOTE — Telephone Encounter (Signed)
Pt thought appt was today - was sch 12/26/11. Worked pt in today due to back pain. Stanton Kidney Valerya Maxton RN 12/28/11 2:40PM

## 2012-01-03 ENCOUNTER — Encounter: Payer: Self-pay | Admitting: Licensed Clinical Social Worker

## 2012-01-04 NOTE — Progress Notes (Signed)
Mr. Hasegawa presents as a walk-in today to see CSW.  Pt states a need for stable housing, as he has been living back and forth between friends.  Mr. Behnken receives food stamps, has applied for SSDI which was denied.  Pt did not appeal his denial and does not remember how long it has been since he applied.  Mr. Damron attends church at [employer] and uses their address and phone number as a contact.   CSW discussed the possibility of [facility] as a housing option.  Mr. Petit unsure if he feels comfortable rooming with another man.  Pt inquiring about his own house.  CSW informed Mr. Ryser this CSW is unaware of any housing options with individual private room without charge.  Pt declines shelter listing.  Discussed program requirements of [facility], pt states he sounds too much like a half-way house.  CSW will hold on application for referral but will inquire if rooms are available.  If rooms are available, will request program to contact Mr. Swider to discuss program requirements.  CSW left message.   CSW discussed [facility] disability assistance program.  Pt in agreement to referral for [facility] Disability Assistance program to assist Mr. Kussman with completing his disability application.  CSW placed call to request referral form as the website link is broken.  CSW discussed vocational resources.  Pt states he is too concerned regarding his cardiac history and back pain to find employment.  Pt states he has worked many years and is "entitled to disability".   CSW provided Mr. Majerus with information to [employer].  As [facility] has resources that may be able to assist with housing and disability.  CSW also informed pt [employer] has laundry and shower available.  CSW also left message for the [facility] indigent case worker to inquire about pt obtaining a free cell phone.  Pt states he has been trying to get an "[employer]one" off of [employer].

## 2012-01-23 ENCOUNTER — Ambulatory Visit (INDEPENDENT_AMBULATORY_CARE_PROVIDER_SITE_OTHER): Payer: Self-pay | Admitting: Internal Medicine

## 2012-01-23 ENCOUNTER — Encounter: Payer: Self-pay | Admitting: Internal Medicine

## 2012-01-23 VITALS — BP 114/74 | HR 91 | Temp 97.7°F | Ht 70.0 in | Wt 160.6 lb

## 2012-01-23 DIAGNOSIS — M549 Dorsalgia, unspecified: Secondary | ICD-10-CM

## 2012-01-23 DIAGNOSIS — F1011 Alcohol abuse, in remission: Secondary | ICD-10-CM

## 2012-01-23 DIAGNOSIS — Z1211 Encounter for screening for malignant neoplasm of colon: Secondary | ICD-10-CM

## 2012-01-23 DIAGNOSIS — R079 Chest pain, unspecified: Secondary | ICD-10-CM

## 2012-01-23 DIAGNOSIS — Z0000 Encounter for general adult medical examination without abnormal findings: Secondary | ICD-10-CM

## 2012-01-23 DIAGNOSIS — E785 Hyperlipidemia, unspecified: Secondary | ICD-10-CM

## 2012-01-23 DIAGNOSIS — I252 Old myocardial infarction: Secondary | ICD-10-CM

## 2012-01-23 DIAGNOSIS — F1010 Alcohol abuse, uncomplicated: Secondary | ICD-10-CM

## 2012-01-23 DIAGNOSIS — Z23 Encounter for immunization: Secondary | ICD-10-CM

## 2012-01-23 DIAGNOSIS — I10 Essential (primary) hypertension: Secondary | ICD-10-CM

## 2012-01-23 LAB — CBC WITH DIFFERENTIAL/PLATELET
Basophils Absolute: 0 10*3/uL (ref 0.0–0.1)
Basophils Relative: 0 % (ref 0–1)
Eosinophils Absolute: 0.1 10*3/uL (ref 0.0–0.7)
Eosinophils Relative: 2 % (ref 0–5)
HCT: 37 % — ABNORMAL LOW (ref 39.0–52.0)
Lymphocytes Relative: 30 % (ref 12–46)
MCH: 31.8 pg (ref 26.0–34.0)
MCHC: 33.5 g/dL (ref 30.0–36.0)
MCV: 94.9 fL (ref 78.0–100.0)
Monocytes Absolute: 0.5 10*3/uL (ref 0.1–1.0)
Platelets: 292 10*3/uL (ref 150–400)
RDW: 13.4 % (ref 11.5–15.5)

## 2012-01-23 MED ORDER — NITROGLYCERIN 0.3 MG SL SUBL: 0.3000 mg | SUBLINGUAL_TABLET | SUBLINGUAL | Status: DC | PRN

## 2012-01-23 MED ORDER — CYCLOBENZAPRINE HCL 5 MG PO TABS: 5.0000 mg | ORAL_TABLET | Freq: Three times a day (TID) | ORAL | Status: DC | PRN

## 2012-01-23 NOTE — Assessment & Plan Note (Signed)
He has a history of MI in 2003. Myoview in 2007 showed anteroseptal wall ischemia. He was seen in our clinic at the end of July by Dr. Zane Herald for worsening exertional chest pain concerning for unstable angina, however at that time he had not been taking any of his medications. Since restarting beta blocker and HCTZ he's had very infrequent episodes of angina. Over the past 3 weeks he reports one episode of chest pain that resolved with nitroglycerin therapy. -Referral was made today for cardiology. -Continue atenolol, HCTZ, pravastatin, nitroglycerin, and aspirin as prescribed.

## 2012-01-23 NOTE — Assessment & Plan Note (Signed)
LDL in July was 117 and pravastatin dose was increased from 20mg  qd to 40mg  qd one month ago. Pt tolerating dose increase without side effects. - Continue pravastatin at 40mg . - F/u LFTs - Recheck cholesterol in 3 mo

## 2012-01-23 NOTE — Assessment & Plan Note (Signed)
Today pt says he has had EtOH beverages "rarely" over past month, about one beer per night. He denies any binge drinking over the past 6 months. - As patient with history of chronic alcohol abuse on current statin therapy will check LFTs and CBC today -Applauded patient for decrement in alcohol use. Encouraged continued abstinence. Emphasized importance of abstinence in setting of statin therapy.

## 2012-01-23 NOTE — Assessment & Plan Note (Signed)
Lab Results  Component Value Date   NA 141 08/13/2010   K 4.0 08/13/2010   CL 109 08/13/2010   CO2 22 08/13/2010   BUN 15 08/13/2010   CREATININE 0.83 08/13/2010   CREATININE 1.07 06/08/2010    BP Readings from Last 3 Encounters:  01/23/12 114/74  12/28/11 136/90  11/29/11 151/96    Assessment: Hypertension control:  controlled  Progress toward goals:  at goal Barriers to meeting goals:  financial need  Pt previously uncontrolled when not taking medications. Since fillling meds and complying with therapy, is normotensive today at 114/74.   Plan: Hypertension treatment:  continue current medications atenolol 50 mg qd and HCTZ 25mg  qd

## 2012-01-23 NOTE — Assessment & Plan Note (Signed)
Pt reports today father diagnosed w colon cancer in 103s. He has not had previous Colon Ca screening. No hematochezia or melena. - Made referral to GI for colonoscopy now that pt has orange card. The importance of attending this visit in obtaining screening in light of family history was discussed with patient. He reassured me that he would attend appointment as scheduled.

## 2012-01-23 NOTE — Progress Notes (Signed)
Patient ID: Devin [name], male   DOB: Feb 25, 1961, 51 y.o.   MRN: [id number]  Subjective:   Patient ID: Devin [name] male   DOB: 15-Mar-1961 51 y.o.   MRN: [id number]  HPI: [name] is a 51 y.o. with past medical history hypertension, hyperlipidemia, remote MI, and prior alcohol abuse presenting for routine followup. Patient was seen in the clinic at the end of July by Dr. Zane Herald for worsening exertional chest pain, which was concerning for unstable angina. However at that time he not be compliant with any of his medications including beta blocker. A referral was made for cardiology at that time which the patient did not followup with. Today he reports the anginal symptoms are much improved since restarting all his home medications including aspirin, Hyzaar, atenolol, nitroglycerin, and pravastatin. He he says that he has only required nitroglycerin once in the past 3 weeks, but he does continue to feel occasional substernal chest pressure with exertion. He denies any shortness of breath, swelling of extremities, dyspnea on exertion, paroxysmal nocturnal dyspnea or cough.  He says his only complaints today are his chronic low back pain for which she takes Flexeril occasionally as well as Tylenol. Patient also reports that his father was diagnosed with colon cancer in his 63s, the patient has not had previous colon cancer screening. He denies any hematochezia or melena. He is interested in receiving screening today. In regards to his chronic alcohol abuse, patient reports that he has cut back on the and currently only drinks about one beer a night and 2 beers on the weekends. He denies any binge drinking over the past 6 months. He says he smokes one cigarette a day and is not interested in quitting at this time.  Past Medical History  Diagnosis Date  . Hypertension   . Hyperlipidemia   . Alcohol abuse   . MI (myocardial infarction) 2003    evaluated at [facility]  . Palpitation   . Inadequate  material resources     homeless at this point,    Current Outpatient Prescriptions  Medication Sig Dispense Refill  . aspirin 81 MG EC tablet Take 1 tablet (81 mg total) by mouth daily.  31 tablet  5  . atenolol (TENORMIN) 50 MG tablet Take 1 tablet (50 mg total) by mouth daily.  30 tablet  5  . cyclobenzaprine (FLEXERIL) 5 MG tablet Take 1 tablet (5 mg total) by mouth every 8 (eight) hours as needed for muscle spasms.  30 tablet  1  . hydrochlorothiazide (HYDRODIURIL) 25 MG tablet Take 1 tablet (25 mg total) by mouth daily.  31 tablet  5  . nitroGLYCERIN (NITROSTAT) 0.3 MG SL tablet Place 1 tablet (0.3 mg total) under the tongue every 5 (five) minutes as needed for chest pain.  20 tablet  2  . omeprazole (PRILOSEC) 40 MG capsule Take 1 capsule (40 mg total) by mouth daily.  30 capsule  5  . pravastatin (PRAVACHOL) 40 MG tablet Take 1 tablet (40 mg total) by mouth daily.  31 tablet  5  . DISCONTD: nitroGLYCERIN (NITROSTAT) 0.3 MG SL tablet Place 1 tablet (0.3 mg total) under the tongue every 5 (five) minutes as needed for chest pain.  20 tablet  0   Family History  Problem Relation Age of Onset  . Breast cancer Mother   . Breast cancer Sister    History   Social History  . Marital Status: Single    Spouse Name: N/A  Number of Children: N/A  . Years of Education: N/A   Social History Main Topics  . Smoking status: Current Some Day Smoker -- 0.1 packs/day for 10 years    Types: Cigarettes  . Smokeless tobacco: Never Used   Comment: pt only smoke 2-3 cigarettes /week  . Alcohol Use: 0.0 oz/week     aeveage 3 bottle beers/months  . Drug Use: Yes    Special: Marijuana     mariJuana 10 times/year  . Sexually Active: None   Other Topics Concern  . None   Social History Narrative   Software engineer initiated. Patient needs to submit further paperwork to complete.Per Rudell Cobb 02/18/2010   Review of Systems: Constitutional: Denies fever, chills, diaphoresis,  appetite change and fatigue.  HEENT: Denies photophobia, eye pain, redness, hearing loss, ear pain, congestion, sore throat, rhinorrhea Respiratory: per HPI Cardiovascular: per HPI Gastrointestinal: Denies nausea, vomiting, abdominal pain, diarrhea, constipation, blood in stool and abdominal distention.  Genitourinary: Denies dysuria, urgency, frequency, hematuria, flank pain and difficulty urinating.  Musculoskeletal: + back pain Denies myalgias or joint swelling Skin: Denies rash Neurological: Denies dizziness, seizures, syncope.  Hematological: Denies adenopathy or easy bleeding.  Psychiatric/Behavioral: Denies mood changes   Objective:  Physical Exam: Filed Vitals:   01/23/12 1519  BP: 114/74  Pulse: 91  Temp: 97.7 F (36.5 C)  TempSrc: Oral  Height: 5\' 10"  (1.778 m)  Weight: 160 lb 9.6 oz (72.848 kg)  SpO2: 100%   Constitutional: Vital signs reviewed.  Patient is a slightly disheveled man in no acute distress and cooperative with exam. Alert and oriented x3.  Head: Absent dentition, caries Ear: TM normal bilaterally Mouth: no erythema or exudates, MMM Eyes: PERRL, EOMI. No scleral icterus.  Neck: Supple, Trachea midline normal ROM, No JVD, mass, thyromegaly, or carotid bruit present.  Cardiovascular: RRR, S1 normal, S2 normal, no MRG, pulses symmetric and intact bilaterally Pulmonary/Chest: CTAB, no wheezes, rales, or rhonchi Abdominal: Soft. Non-tender, non-distended, bowel sounds are normal, no masses, organomegaly, or guarding present.  GU: no CVA tenderness Musculoskeletal: Mild TTP of bilateral lumbar paraspinalis muscles. No joint deformities, erythema, or stiffness, ROM full Hematology: no cervical or supraclavicular adenopathy.  Neurological: A&O x3, Strength is normal and symmetric bilaterally, cranial nerve II-XII are grossly intact, no focal motor deficit, sensory intact to light touch bilaterally.  Skin: Warm, dry and intact. Psychiatric: Normal mood and  affect. speech and behavior is normal. Judgment and thought content normal. Cognition and memory are normal.   Assessment & Plan:
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
## 2012-01-23 NOTE — Patient Instructions (Addendum)
1. Please continue taking all of your medications as prescribed. Please refill your medications at your pharmacy. 2. We have made a referral for you to see a heart doctor for your chest pain. Someone will call you to schedule this appointment. In the mean time, if you get chest pain, put one nitroglycerin tablet under your tongue. If the pain does not get better after 5 minutes, take another tablet. If the pain is still not better after 5 more minutes, take a third tablet. If the pain still persists, call 911.  3. We have a made a referral for you to have a colonoscopy to screen for colon cancer. Someone will call you to schedule this appointment. It is very important to go to this appointment.  4. Please come back to the clinic in 3 months.

## 2012-01-24 LAB — COMPREHENSIVE METABOLIC PANEL
ALT: 50 U/L (ref 0–53)
AST: 82 U/L — ABNORMAL HIGH (ref 0–37)
Alkaline Phosphatase: 93 U/L (ref 39–117)
Creat: 0.96 mg/dL (ref 0.50–1.35)
Sodium: 138 mEq/L (ref 135–145)
Total Bilirubin: 0.8 mg/dL (ref 0.3–1.2)
Total Protein: 7.1 g/dL (ref 6.0–8.3)

## 2012-01-25 ENCOUNTER — Other Ambulatory Visit: Payer: Self-pay | Admitting: Internal Medicine

## 2012-01-25 DIAGNOSIS — F1010 Alcohol abuse, uncomplicated: Secondary | ICD-10-CM

## 2012-01-25 NOTE — Progress Notes (Signed)
INTERNAL MEDICINE TEACHING ATTENDING ADDENDUM - Jonah Blue, DO: I personally saw and evaluated Devin [name] in this clinic visit in conjunction with the resident, Dr. Heloise Beecham. I have discussed patient's plan of care with medical resident during this visit. I have confirmed the physical exam findings and have read and agree with the clinic note including the plan.

## 2012-02-15 ENCOUNTER — Encounter: Payer: Self-pay | Admitting: *Deleted

## 2012-03-05 NOTE — Addendum Note (Signed)
Addended by: Dorie Rank E on: 03/05/2012 02:50 PM   Modules accepted: Orders

## 2012-03-06 ENCOUNTER — Encounter: Payer: Self-pay | Admitting: Licensed Clinical Social Worker

## 2012-03-06 ENCOUNTER — Other Ambulatory Visit (INDEPENDENT_AMBULATORY_CARE_PROVIDER_SITE_OTHER): Payer: Self-pay

## 2012-03-06 DIAGNOSIS — F1010 Alcohol abuse, uncomplicated: Secondary | ICD-10-CM

## 2012-03-06 LAB — HEPATIC FUNCTION PANEL
ALT: 42 U/L (ref 0–53)
Albumin: 4.2 g/dL (ref 3.5–5.2)
Indirect Bilirubin: 0.2 mg/dL (ref 0.0–0.9)
Total Protein: 7.6 g/dL (ref 6.0–8.3)

## 2012-03-06 NOTE — Progress Notes (Signed)
Devin [name] presents to CSW after his scheduled lap appt. Pt dressed appropriately but having urine odor.  Pt inquiring about housing/shelter options.  CSW reminded Devin [name] of prior discussion regarding [facility] housing, pt would share room and would need to attend AA meetings.  Pt again stated that would be more like a "half way house".  CSW discussion pt prior hesitation with [facility] housing and [facility], both pt would have to share rooms.  Pt states moving to [place] with his sister is an option, but pt does not want to move back to [place].   CSW encouraged Devin [name] to utilize the [employer], as they may have more options as the specifically serve the homeless populations.  CSW provided Devin [name] with information on [facility], encouraged pt to reach out and ask for housing options from [facility], utilize laundry and bath facilities discuss possibly urine odor coming from clothing.  Pt denies need for food/pantry items as pt receives food stamps.  CSW inquired if pt had cell phone from food stamp benefit.  Pt did not. Devin [name] completed [employer] application today, CSW faxed application.  CSW provided Devin [name] will two bus passes to obtain [place] medication [place] the [facility] pharmacy.  Pt denies add'l needs at this time.

## 2012-03-07 ENCOUNTER — Encounter: Payer: Self-pay | Admitting: Internal Medicine

## 2012-03-07 NOTE — Progress Notes (Signed)
Patient ID: Devin [name], male   DOB: [birth year], 51 y.o.   MRN: [id number]  This patient is a CHRONIC NO-SHOW PATIENT that has a history of HYPERTENSION.  Please make sure to address hypertension during his next clinic appointment, and intervene as appropriate.    Within the AVS, please incorporate the following smartphrase: .htntips   Pertinent Data: BP Readings from Last 3 Encounters:  01/23/12 114/74  12/28/11 136/90  11/29/11 151/96    BMI: Estimated Body mass index is 23.04 kg/(m^2) as calculated from the following:   Height as of 01/23/12: 5\' 10" (1.778 m).   Weight as of 01/23/12: 160 lb 9.6 oz(72.848 kg).  Smoking History: History  Smoking status  . Current Some Day Smoker -- 0.1 packs/day for 10 years  . Types: Cigarettes  Smokeless tobacco  . Never Used    Comment: pt only smoke 2-3 cigarettes /week    Last Basic Metabolic Panel:    Component Value Date/Time   [name] 138 01/23/2012 1643   K 3.7 01/23/2012 1643   CL 103 01/23/2012 1643   CO2 25 01/23/2012 1643   BUN 8 01/23/2012 1643   CREATININE 0.96 01/23/2012 1643   CREATININE 0.83 08/13/2010 2328   GLUCOSE 108* 01/23/2012 1643   CALCIUM 8.9 01/23/2012 1643    Allergies: No Known Allergies

## 2012-03-29 ENCOUNTER — Encounter: Payer: Self-pay | Admitting: *Deleted

## 2012-04-01 ENCOUNTER — Ambulatory Visit (INDEPENDENT_AMBULATORY_CARE_PROVIDER_SITE_OTHER): Payer: Self-pay | Admitting: Cardiovascular Disease

## 2012-04-01 ENCOUNTER — Encounter: Payer: Self-pay | Admitting: Cardiovascular Disease

## 2012-04-01 VITALS — BP 136/94 | HR 107 | Ht 70.0 in | Wt 157.8 lb

## 2012-04-01 DIAGNOSIS — R0600 Dyspnea, unspecified: Secondary | ICD-10-CM

## 2012-04-01 DIAGNOSIS — R0989 Other specified symptoms and signs involving the circulatory and respiratory systems: Secondary | ICD-10-CM

## 2012-04-01 DIAGNOSIS — R079 Chest pain, unspecified: Secondary | ICD-10-CM

## 2012-04-01 DIAGNOSIS — R002 Palpitations: Secondary | ICD-10-CM

## 2012-04-01 NOTE — Assessment & Plan Note (Signed)
Devin [name] presents today with hypertension, mild tachycardia and atypical chest pain. These episodes of chest pain last for about 5 seconds. He's had them for many years. He's had 2 normal heart catheterizations in the past.  He was scheduled for an echocardiogram last year but failed to show for the appointment.  We'll schedule him for another echocardiogram.   At this point I do not think that he needs a cardiac catheterization. He's had these chest pains for many years and has had normal coronary arteries. In addition the character of the chest pain does not suggest a cardiac etiology.  He discontinued all of his medications several weeks ago. I have advised him to stay on all his medications. He'll followup with his general medical doctor.  I've advised him to eat a low-salt diet.

## 2012-04-01 NOTE — Patient Instructions (Addendum)
Resume medications  Your physician has requested that you have an echocardiogram. Echocardiography is a painless test that uses sound waves to create images of your heart. It provides your doctor with information about the size and shape of your heart and how well your heart's chambers and valves are working. This procedure takes approximately one hour. There are no restrictions for this procedure.  Your physician recommends that you schedule a follow-up appointment in: as needed depending on echo results
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
## 2012-04-04 ENCOUNTER — Telehealth: Payer: Self-pay | Admitting: *Deleted

## 2012-04-04 DIAGNOSIS — R079 Chest pain, unspecified: Secondary | ICD-10-CM

## 2012-04-04 MED ORDER — NITROGLYCERIN 0.4 MG SL SUBL: 0.4000 mg | SUBLINGUAL_TABLET | SUBLINGUAL | Status: DC | PRN

## 2012-04-04 NOTE — Telephone Encounter (Signed)
I changed the prescription to the 0.4 mg tablet size.

## 2012-04-04 NOTE — Telephone Encounter (Signed)
Nitrostat 0.4mg  rx called to [facility].

## 2012-04-04 NOTE — Telephone Encounter (Signed)
Fax from [facility] - they do not have Nitrostat 0.3mg  SL, only have 0.4mg  SL. They need a new rx   Thanks

## 2012-04-08 ENCOUNTER — Other Ambulatory Visit (HOSPITAL_COMMUNITY): Payer: Self-pay

## 2012-04-09 ENCOUNTER — Encounter: Payer: Self-pay | Admitting: Internal Medicine

## 2012-04-09 ENCOUNTER — Ambulatory Visit (INDEPENDENT_AMBULATORY_CARE_PROVIDER_SITE_OTHER): Payer: No Typology Code available for payment source | Admitting: Internal Medicine

## 2012-04-09 VITALS — BP 136/86 | HR 96 | Temp 98.5°F | Ht 70.0 in | Wt 158.8 lb

## 2012-04-09 DIAGNOSIS — I252 Old myocardial infarction: Secondary | ICD-10-CM

## 2012-04-09 DIAGNOSIS — I10 Essential (primary) hypertension: Secondary | ICD-10-CM

## 2012-04-09 DIAGNOSIS — E785 Hyperlipidemia, unspecified: Secondary | ICD-10-CM

## 2012-04-09 DIAGNOSIS — Z0000 Encounter for general adult medical examination without abnormal findings: Secondary | ICD-10-CM | POA: Insufficient documentation

## 2012-04-09 DIAGNOSIS — R079 Chest pain, unspecified: Secondary | ICD-10-CM

## 2012-04-09 DIAGNOSIS — Z125 Encounter for screening for malignant neoplasm of prostate: Secondary | ICD-10-CM

## 2012-04-09 DIAGNOSIS — F1010 Alcohol abuse, uncomplicated: Secondary | ICD-10-CM

## 2012-04-09 DIAGNOSIS — K219 Gastro-esophageal reflux disease without esophagitis: Secondary | ICD-10-CM

## 2012-04-09 DIAGNOSIS — M549 Dorsalgia, unspecified: Secondary | ICD-10-CM

## 2012-04-09 DIAGNOSIS — Z842 Family history of other diseases of the genitourinary system: Secondary | ICD-10-CM

## 2012-04-09 MED ORDER — OMEPRAZOLE 40 MG PO CPDR
40.0000 mg | DELAYED_RELEASE_CAPSULE | Freq: Every day | ORAL | Status: DC
Start: 2012-04-09 — End: 2013-03-30

## 2012-04-09 MED ORDER — HYDROCHLOROTHIAZIDE 25 MG PO TABS: 25.0000 mg | ORAL_TABLET | Freq: Every day | ORAL | Status: DC

## 2012-04-09 MED ORDER — ATENOLOL 50 MG PO TABS: 50.0000 mg | ORAL_TABLET | Freq: Every day | ORAL | Status: DC

## 2012-04-09 MED ORDER — CYCLOBENZAPRINE HCL 5 MG PO TABS: 5.0000 mg | ORAL_TABLET | Freq: Three times a day (TID) | ORAL | Status: DC | PRN

## 2012-04-09 MED ORDER — ASPIRIN 81 MG PO TBEC: 81.0000 mg | DELAYED_RELEASE_TABLET | Freq: Every day | ORAL | Status: DC

## 2012-04-09 NOTE — Assessment & Plan Note (Signed)
Patient reports some symptoms of BPH with occasional difficulty initiating a stream and feelings of urinary retention that occur up to 1-2 times per week. He is concerned because he thinks his father may have died of prostate cancer in his mid 87s, but says that it may have been colon cancer he just cannot remember. Rectal exam reveals smooth enlarged prostate. Discussed at length the risks and benefits of screening with PSA. He elected to proceed with screening. -Order PSA today -Continued or worsening symptoms of urinary obstruction, will start therapy for BPH. This may also benefit his blood pressure.

## 2012-04-09 NOTE — Assessment & Plan Note (Signed)
Lab Results  Component Value Date   CHOL 249* 11/29/2011   HDL 110 11/29/2011   LDLCALC 117* 11/29/2011   TRIG 108 11/29/2011   CHOLHDL 2.3 11/29/2011   Lab Results  Component Value Date   ALT 42 03/06/2012   AST 65* 03/06/2012   ALKPHOS 103 03/06/2012   BILITOT 0.3 03/06/2012    Tolerating pravastatin 40 mg well. Liver function tests remain stable with pravastatin therapy increase. Will recheck lipids at next visit in approximately 3 months.
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
## 2012-04-09 NOTE — Assessment & Plan Note (Signed)
Concern for history of colonic malignancy in father, who died in his mid-50s. Patient has occasional bleeding hemorrhoids but no gross blood in his stool or melena. Patient was referred to gastroenterology for colonoscopy now that he has orange card. As he has an outstanding balance there, he will have to call himself to scheduled appointment. He was provided the number today, and says he'll call. -Referral someplace, patient will need to call GI to scheduled appointment. If he is unable to followup with a specialist, we will need to consider FOBT testing at next clinic visit.
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
## 2012-04-09 NOTE — Patient Instructions (Signed)
1. Please continue all of your medications. 2. Please follow up with the GI doctors to be evaluated for colon cancer. It is VERY IMPORTANT that you make this appointment given your family history. 3. It is VERY IMPORTANT that you continue to abstain from smoking and alcohol.       Smoking Cessation Quitting smoking is important to your health and has many advantages. However, it is not always easy to quit since nicotine is a very addictive drug. Often times, people try 3 times or more before being able to quit. This document explains the best ways for you to prepare to quit smoking. Quitting takes hard work and a lot of effort, but you can do it. ADVANTAGES OF QUITTING SMOKING  You will live longer, feel better, and live better.  Your body will feel the impact of quitting smoking almost immediately.  Within 20 minutes, blood pressure decreases. Your pulse returns to its normal level.  After 8 hours, carbon monoxide levels in the blood return to normal. Your oxygen level increases.  After 24 hours, the chance of having a heart attack starts to decrease. Your breath, hair, and body stop smelling like smoke.  After 48 hours, damaged nerve endings begin to recover. Your sense of taste and smell improve.  After 72 hours, the body is virtually free of nicotine. Your bronchial tubes relax and breathing becomes easier.  After 2 to 12 weeks, lungs can hold more air. Exercise becomes easier and circulation improves.  The risk of having a heart attack, stroke, cancer, or lung disease is greatly reduced.  After 1 year, the risk of coronary heart disease is cut in half.  After 5 years, the risk of stroke falls to the same as a nonsmoker.  After 10 years, the risk of lung cancer is cut in half and the risk of other cancers decreases significantly.  After 15 years, the risk of coronary heart disease drops, usually to the level of a nonsmoker.  If you are pregnant, quitting smoking will improve  your chances of having a healthy baby.  The people you live with, especially any children, will be healthier.  You will have extra money to spend on things other than cigarettes. QUESTIONS TO THINK ABOUT BEFORE ATTEMPTING TO QUIT You may want to talk about your answers with your caregiver.  Why do you want to quit?  If you tried to quit in the past, what helped and what did not?  What will be the most difficult situations for you after you quit? How will you plan to handle them?  Who can help you through the tough times? Your family? Friends? A caregiver?  What pleasures do you get from smoking? What ways can you still get pleasure if you quit? Here are some questions to ask your caregiver:  How can you help me to be successful at quitting?  What medicine do you think would be best for me and how should I take it?  What should I do if I need more help?  What is smoking withdrawal like? How can I get information on withdrawal? GET READY  Set a quit date.  Change your environment by getting rid of all cigarettes, ashtrays, matches, and lighters in your home, car, or work. Do not let people smoke in your home.  Review your past attempts to quit. Think about what worked and what did not. GET SUPPORT AND ENCOURAGEMENT You have a better chance of being successful if you have help. You  can get support in many ways.  Tell your family, friends, and co-workers that you are going to quit and need their support. Ask them not to smoke around you.  Get individual, group, or telephone counseling and support. Programs are available at [employer] and health centers. Call your local health department for information about programs in your area.  Spiritual beliefs and practices may help some smokers quit.  Download a "quit meter" on your computer to keep track of quit statistics, such as how long you have gone without smoking, cigarettes not smoked, and money saved.  Get a self-help  book about quitting smoking and staying off of tobacco. LEARN NEW SKILLS AND BEHAVIORS  Distract yourself from urges to smoke. Talk to someone, go for a walk, or occupy your time with a task.  Change your normal routine. Take a different route to work. Drink tea instead of coffee. Eat breakfast in a different place.  Reduce your stress. Take a hot bath, exercise, or read a book.  Plan something enjoyable to do every day. Reward yourself for not smoking.  Explore interactive web-based programs that specialize in helping you quit. GET MEDICINE AND USE IT CORRECTLY Medicines can help you stop smoking and decrease the urge to smoke. Combining medicine with the above behavioral methods and support can greatly increase your chances of successfully quitting smoking.  Nicotine replacement therapy helps deliver nicotine to your body without the negative effects and risks of smoking. Nicotine replacement therapy includes nicotine gum, lozenges, inhalers, nasal sprays, and skin patches. Some may be available over-the-counter and others require a prescription.  Antidepressant medicine helps people abstain from smoking, but how this works is unknown. This medicine is available by prescription.  Nicotinic receptor partial agonist medicine simulates the effect of nicotine in your brain. This medicine is available by prescription. Ask your caregiver for advice about which medicines to use and how to use them based on your health history. Your caregiver will tell you what side effects to look out for if you choose to be on a medicine or therapy. Carefully read the information on the package. Do not use any other product containing nicotine while using a nicotine replacement product.  RELAPSE OR DIFFICULT SITUATIONS Most relapses occur within the first 3 months after quitting. Do not be discouraged if you start smoking again. Remember, most people try several times before finally quitting. You may have symptoms  of withdrawal because your body is used to nicotine. You may crave cigarettes, be irritable, feel very hungry, cough often, get headaches, or have difficulty concentrating. The withdrawal symptoms are only temporary. They are strongest when you first quit, but they will go away within 10 14 days. To reduce the chances of relapse, try to:  Avoid drinking alcohol. Drinking lowers your chances of successfully quitting.  Reduce the amount of caffeine you consume. Once you quit smoking, the amount of caffeine in your body increases and can give you symptoms, such as a rapid heartbeat, sweating, and anxiety.  Avoid smokers because they can make you want to smoke.  Do not let weight gain distract you. Many smokers will gain weight when they quit, usually less than 10 pounds. Eat a healthy diet and stay active. You can always lose the weight gained after you quit.  Find ways to improve your mood other than smoking. FOR MORE INFORMATION  www.smokefree.gov  Document Released: 04/11/2001 Document Revised: 10/17/2011 Document Reviewed: 07/27/2011 [facility] Patient Information 2013 [place], [place].

## 2012-04-09 NOTE — Assessment & Plan Note (Signed)
Lab Results  Component Value Date   NA 138 01/23/2012   K 3.7 01/23/2012   CL 103 01/23/2012   CO2 25 01/23/2012   BUN 8 01/23/2012   CREATININE 0.96 01/23/2012   CREATININE 0.83 08/13/2010    BP Readings from Last 3 Encounters:  04/09/12 136/86  04/01/12 136/94  01/23/12 114/74    Assessment: Hypertension control:  At goal Progress toward goals:  unchanged Barriers to meeting goals:  financial need and nonadherence to medications  Plan: Hypertension treatment:  continue current medications  Patient's hypertension is fairly well controlled when he actually takes antihypertensive therapies. He reports compliance with atenolol 50mg  daily  and HCTZ 25 mg daily currently.

## 2012-04-09 NOTE — Assessment & Plan Note (Signed)
Patient reports that his last alcoholic beverage was November 16 of his workday. He reports success with continued abstinence understanding importance of abstinence in light of risk factors and medication side effects. Congratulated him on his continued abstinence.

## 2012-04-10 ENCOUNTER — Ambulatory Visit (HOSPITAL_COMMUNITY): Payer: No Typology Code available for payment source | Attending: Cardiovascular Disease | Admitting: Radiology

## 2012-04-10 DIAGNOSIS — R079 Chest pain, unspecified: Secondary | ICD-10-CM

## 2012-04-10 DIAGNOSIS — I252 Old myocardial infarction: Secondary | ICD-10-CM | POA: Insufficient documentation

## 2012-04-10 DIAGNOSIS — R0609 Other forms of dyspnea: Secondary | ICD-10-CM | POA: Insufficient documentation

## 2012-04-10 DIAGNOSIS — R002 Palpitations: Secondary | ICD-10-CM

## 2012-04-10 DIAGNOSIS — R0600 Dyspnea, unspecified: Secondary | ICD-10-CM

## 2012-04-10 DIAGNOSIS — R0989 Other specified symptoms and signs involving the circulatory and respiratory systems: Secondary | ICD-10-CM | POA: Insufficient documentation

## 2012-04-10 DIAGNOSIS — F1010 Alcohol abuse, uncomplicated: Secondary | ICD-10-CM | POA: Insufficient documentation

## 2012-04-10 DIAGNOSIS — I10 Essential (primary) hypertension: Secondary | ICD-10-CM | POA: Insufficient documentation

## 2012-04-10 DIAGNOSIS — E785 Hyperlipidemia, unspecified: Secondary | ICD-10-CM | POA: Insufficient documentation

## 2012-04-10 DIAGNOSIS — R072 Precordial pain: Secondary | ICD-10-CM | POA: Insufficient documentation

## 2012-04-10 NOTE — Progress Notes (Signed)
Echocardiogram performed.  

## 2012-04-19 ENCOUNTER — Encounter: Payer: Self-pay | Admitting: Licensed Clinical Social Worker

## 2012-04-29 ENCOUNTER — Telehealth: Payer: Self-pay | Admitting: *Deleted

## 2012-04-29 ENCOUNTER — Encounter: Payer: Self-pay | Admitting: *Deleted

## 2012-04-29 NOTE — Telephone Encounter (Signed)
Will mail request for pt to call to make appointment, unable to reach at any of the numbers listed.

## 2012-07-15 ENCOUNTER — Other Ambulatory Visit: Payer: Self-pay | Admitting: Internal Medicine

## 2012-07-15 ENCOUNTER — Encounter: Payer: Self-pay | Admitting: Internal Medicine

## 2012-07-15 DIAGNOSIS — R0789 Other chest pain: Secondary | ICD-10-CM

## 2012-07-15 DIAGNOSIS — Z720 Tobacco use: Secondary | ICD-10-CM
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
## 2012-07-16 ENCOUNTER — Encounter: Payer: Self-pay | Admitting: Internal Medicine

## 2012-10-10 ENCOUNTER — Encounter: Payer: Self-pay | Admitting: Internal Medicine

## 2013-01-03 ENCOUNTER — Telehealth: Payer: Self-pay | Admitting: Internal Medicine

## 2013-01-03 ENCOUNTER — Encounter: Payer: Self-pay | Admitting: Internal Medicine

## 2013-01-03 NOTE — Telephone Encounter (Signed)
Attempted to contact patient this morning to schedule his HM visit.  No answer, but I did leave detailed message asking the patient to give [place] a call back.  I will send patient a letter also asking to give [place] a call back to schedule an appointment.  This will be the third letter sent by me to communicate with the patient about calling us for an appt.  He was also sent letters on 07-16-2012 and 10-10-2012.

## 2013-02-14 ENCOUNTER — Encounter (HOSPITAL_COMMUNITY): Payer: Self-pay | Admitting: Emergency Medicine

## 2013-02-14 ENCOUNTER — Emergency Department (HOSPITAL_COMMUNITY)
Admission: EM | Admit: 2013-02-14 | Discharge: 2013-02-14 | Disposition: A | Discharge status: Home / Self Care | Payer: Self-pay | Attending: Emergency Medicine | Admitting: Emergency Medicine

## 2013-02-14 DIAGNOSIS — I252 Old myocardial infarction: Secondary | ICD-10-CM | POA: Insufficient documentation

## 2013-02-14 DIAGNOSIS — K0889 Other specified disorders of teeth and supporting structures: Secondary | ICD-10-CM

## 2013-02-14 DIAGNOSIS — Z7982 Long term (current) use of aspirin: Secondary | ICD-10-CM | POA: Insufficient documentation

## 2013-02-14 DIAGNOSIS — Z5900 Homelessness unspecified: Secondary | ICD-10-CM | POA: Insufficient documentation

## 2013-02-14 DIAGNOSIS — K089 Disorder of teeth and supporting structures, unspecified: Secondary | ICD-10-CM | POA: Insufficient documentation

## 2013-02-14 DIAGNOSIS — K029 Dental caries, unspecified: Secondary | ICD-10-CM | POA: Insufficient documentation

## 2013-02-14 DIAGNOSIS — R220 Localized swelling, mass and lump, head: Secondary | ICD-10-CM | POA: Insufficient documentation

## 2013-02-14 DIAGNOSIS — Z79899 Other long term (current) drug therapy: Secondary | ICD-10-CM | POA: Insufficient documentation

## 2013-02-14 DIAGNOSIS — Z87828 Personal history of other (healed) physical injury and trauma: Secondary | ICD-10-CM | POA: Insufficient documentation

## 2013-02-14 DIAGNOSIS — E785 Hyperlipidemia, unspecified: Secondary | ICD-10-CM | POA: Insufficient documentation

## 2013-02-14 DIAGNOSIS — I10 Essential (primary) hypertension: Secondary | ICD-10-CM | POA: Insufficient documentation

## 2013-02-14 DIAGNOSIS — R51 Headache: Secondary | ICD-10-CM | POA: Insufficient documentation

## 2013-02-14 MED ORDER — PENICILLIN V POTASSIUM 250 MG PO TABS: 250.0000 mg | ORAL_TABLET | Freq: Four times a day (QID) | ORAL | Status: AC

## 2013-02-14 MED ORDER — PENICILLIN V POTASSIUM 250 MG PO TABS
250.0000 mg | ORAL_TABLET | Freq: Once | ORAL | Status: AC
  Administered 2013-02-14: 250 mg via ORAL
  Filled 2013-02-14: qty 1

## 2013-02-14 NOTE — ED Notes (Signed)
Pt c/o toothache x's 1 week.  Requesting tooth to be pulled
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
## 2013-02-15 NOTE — ED Provider Notes (Signed)
Medical screening examination/treatment/procedure(s) were performed by non-physician practitioner and as supervising physician I was immediately available for consultation/collaboration.  Lyanne Co, MD 02/15/13 [phone]

## 2013-03-30 ENCOUNTER — Emergency Department (HOSPITAL_COMMUNITY): Payer: Self-pay

## 2013-03-30 ENCOUNTER — Encounter (HOSPITAL_COMMUNITY): Payer: Self-pay | Admitting: Emergency Medicine

## 2013-03-30 ENCOUNTER — Emergency Department (HOSPITAL_COMMUNITY)
Admission: EM | Admit: 2013-03-30 | Discharge: 2013-03-30 | Disposition: A | Discharge status: Home / Self Care | Payer: Self-pay | Attending: Emergency Medicine | Admitting: Emergency Medicine

## 2013-03-30 DIAGNOSIS — I10 Essential (primary) hypertension: Secondary | ICD-10-CM

## 2013-03-30 DIAGNOSIS — I252 Old myocardial infarction: Secondary | ICD-10-CM | POA: Insufficient documentation

## 2013-03-30 DIAGNOSIS — F17200 Nicotine dependence, unspecified, uncomplicated: Secondary | ICD-10-CM | POA: Insufficient documentation

## 2013-03-30 DIAGNOSIS — Z79899 Other long term (current) drug therapy: Secondary | ICD-10-CM | POA: Insufficient documentation

## 2013-03-30 DIAGNOSIS — R61 Generalized hyperhidrosis: Secondary | ICD-10-CM | POA: Insufficient documentation

## 2013-03-30 DIAGNOSIS — Z7982 Long term (current) use of aspirin: Secondary | ICD-10-CM | POA: Insufficient documentation

## 2013-03-30 DIAGNOSIS — R0789 Other chest pain: Secondary | ICD-10-CM

## 2013-03-30 DIAGNOSIS — E785 Hyperlipidemia, unspecified: Secondary | ICD-10-CM | POA: Insufficient documentation

## 2013-03-30 LAB — CBC WITH DIFFERENTIAL/PLATELET
Basophils Absolute: 0 10*3/uL (ref 0.0–0.1)
Basophils Relative: 0 % (ref 0–1)
Eosinophils Absolute: 0 10*3/uL (ref 0.0–0.7)
Hemoglobin: 11.6 g/dL — ABNORMAL LOW (ref 13.0–17.0)
Lymphocytes Relative: 13 % (ref 12–46)
Lymphs Abs: 0.7 10*3/uL (ref 0.7–4.0)
MCH: 32.1 pg (ref 26.0–34.0)
MCHC: 33.9 g/dL (ref 30.0–36.0)
MCV: 94.7 fL (ref 78.0–100.0)
Monocytes Absolute: 0.6 10*3/uL (ref 0.1–1.0)
Monocytes Relative: 11 % (ref 3–12)
Neutro Abs: 4.1 10*3/uL (ref 1.7–7.7)
Neutrophils Relative %: 76 % (ref 43–77)
RDW: 13.1 % (ref 11.5–15.5)
WBC: 5.4 10*3/uL (ref 4.0–10.5)

## 2013-03-30 LAB — POCT I-STAT, CHEM 8
BUN: 10 mg/dL (ref 6–23)
Chloride: 105 mEq/L (ref 96–112)
Creatinine, Ser: 1 mg/dL (ref 0.50–1.35)
Glucose, Bld: 77 mg/dL (ref 70–99)
Sodium: 139 mEq/L (ref 135–145)
TCO2: 21 mmol/L (ref 0–100)

## 2013-03-30 LAB — RAPID URINE DRUG SCREEN, HOSP PERFORMED
Barbiturates: NOT DETECTED
Benzodiazepines: NOT DETECTED
Tetrahydrocannabinol: POSITIVE — AB

## 2013-03-30 LAB — POCT I-STAT TROPONIN I: Troponin i, poc: 0.01 ng/mL (ref 0.00–0.08)

## 2013-03-30 MED ORDER — ATENOLOL 50 MG PO TABS: 50.0000 mg | ORAL_TABLET | Freq: Every day | ORAL | Status: DC

## 2013-03-30 MED ORDER — HYDROCHLOROTHIAZIDE 25 MG PO TABS: 25.0000 mg | ORAL_TABLET | Freq: Every day | ORAL | Status: DC

## 2013-03-30 MED ORDER — PRAVASTATIN SODIUM 40 MG PO TABS: 40.0000 mg | ORAL_TABLET | Freq: Every day | ORAL | Status: DC
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
## 2013-03-30 NOTE — ED Notes (Signed)
To room via EMS.  Onset 1 1/2 hours ago pt at [employer] onset with chest pain, shortness of breath, diaphoretic.  No c/o shortness of breath or diaphoretic at this time.  Pain scale 7/10.  EMS gave ASA x 3 81 mg, NTG x 2.  Pt has been out of meds: Atenolol and HTCZ.

## 2013-03-30 NOTE — ED Notes (Signed)
Pt needing bus pass back to friends house he is staying at.  None available in ED tonight.  Per Clydie Braun charge nurse have pt see Child psychotherapist tomorrow 8am for bus pass.  Pt will stay in lobby tonight.  Pt will also see case management worker in am for help in getting prescriptions that were prescribed.

## 2013-03-30 NOTE — ED Notes (Signed)
Pt back from x-ray.

## 2013-05-03 ENCOUNTER — Observation Stay (HOSPITAL_COMMUNITY)
Admission: EM | Admit: 2013-05-03 | Discharge: 2013-05-04 | Disposition: A | Discharge status: Home / Self Care | Payer: Self-pay | Attending: Internal Medicine | Admitting: Internal Medicine

## 2013-05-03 ENCOUNTER — Emergency Department (HOSPITAL_COMMUNITY): Payer: Self-pay

## 2013-05-03 ENCOUNTER — Encounter (HOSPITAL_COMMUNITY): Payer: Self-pay | Admitting: Emergency Medicine

## 2013-05-03 DIAGNOSIS — I43 Cardiomyopathy in diseases classified elsewhere: Secondary | ICD-10-CM

## 2013-05-03 DIAGNOSIS — F1020 Alcohol dependence, uncomplicated: Secondary | ICD-10-CM | POA: Diagnosis present

## 2013-05-03 DIAGNOSIS — Z7982 Long term (current) use of aspirin: Secondary | ICD-10-CM | POA: Insufficient documentation

## 2013-05-03 DIAGNOSIS — F17200 Nicotine dependence, unspecified, uncomplicated: Secondary | ICD-10-CM | POA: Insufficient documentation

## 2013-05-03 DIAGNOSIS — Z5900 Homelessness unspecified: Secondary | ICD-10-CM

## 2013-05-03 DIAGNOSIS — F1010 Alcohol abuse, uncomplicated: Secondary | ICD-10-CM

## 2013-05-03 DIAGNOSIS — I10 Essential (primary) hypertension: Secondary | ICD-10-CM

## 2013-05-03 DIAGNOSIS — I252 Old myocardial infarction: Secondary | ICD-10-CM

## 2013-05-03 DIAGNOSIS — R079 Chest pain, unspecified: Secondary | ICD-10-CM | POA: Diagnosis present

## 2013-05-03 DIAGNOSIS — Z9119 Patient's noncompliance with other medical treatment and regimen: Secondary | ICD-10-CM

## 2013-05-03 DIAGNOSIS — Z720 Tobacco use: Secondary | ICD-10-CM | POA: Diagnosis present

## 2013-05-03 DIAGNOSIS — E785 Hyperlipidemia, unspecified: Secondary | ICD-10-CM | POA: Diagnosis present

## 2013-05-03 DIAGNOSIS — I119 Hypertensive heart disease without heart failure: Secondary | ICD-10-CM | POA: Diagnosis present

## 2013-05-03 DIAGNOSIS — Z9114 Patient's other noncompliance with medication regimen: Secondary | ICD-10-CM

## 2013-05-03 DIAGNOSIS — F1210 Cannabis abuse, uncomplicated: Secondary | ICD-10-CM | POA: Diagnosis present

## 2013-05-03 DIAGNOSIS — E876 Hypokalemia: Secondary | ICD-10-CM

## 2013-05-03 DIAGNOSIS — Z91148 Patient's other noncompliance with medication regimen for other reason: Secondary | ICD-10-CM

## 2013-05-03 DIAGNOSIS — Z5987 Material hardship due to limited financial resources, not elsewhere classified: Secondary | ICD-10-CM | POA: Insufficient documentation

## 2013-05-03 DIAGNOSIS — Z91199 Patient's noncompliance with other medical treatment and regimen due to unspecified reason: Secondary | ICD-10-CM | POA: Insufficient documentation

## 2013-05-03 DIAGNOSIS — R000 Tachycardia, unspecified: Secondary | ICD-10-CM

## 2013-05-03 DIAGNOSIS — R0789 Other chest pain: Principal | ICD-10-CM

## 2013-05-03 DIAGNOSIS — Z598 Other problems related to housing and economic circumstances: Secondary | ICD-10-CM | POA: Insufficient documentation

## 2013-05-03 LAB — BASIC METABOLIC PANEL
BUN: 7 mg/dL (ref 6–23)
CALCIUM: 9 mg/dL (ref 8.4–10.5)
CO2: 25 mEq/L (ref 19–32)
CREATININE: 0.8 mg/dL (ref 0.50–1.35)
Chloride: 101 mEq/L (ref 96–112)
GFR calc Af Amer: >90 mL/min (ref >90)
GFR calc non Af Amer: >90 mL/min (ref >90)
Glucose, Bld: 82 mg/dL (ref 70–99)
Potassium: 4.2 mEq/L (ref 3.7–5.3)
Sodium: 139 mEq/L (ref 137–147)

## 2013-05-03 LAB — HEPATIC FUNCTION PANEL
ALBUMIN: 3.3 g/dL — AB (ref 3.5–5.2)
ALT: 39 U/L (ref 0–53)
AST: 59 U/L — AB (ref 0–37)
Alkaline Phosphatase: 92 U/L (ref 39–117)
Bilirubin, Direct: <0.2 mg/dL (ref 0.0–0.3)
Total Bilirubin: 0.4 mg/dL (ref 0.3–1.2)
Total Protein: 7.4 g/dL (ref 6.0–8.3)

## 2013-05-03 LAB — RAPID URINE DRUG SCREEN, HOSP PERFORMED
Amphetamines: NOT DETECTED
Barbiturates: NOT DETECTED
Benzodiazepines: NOT DETECTED
Cocaine: NOT DETECTED
OPIATES: NOT DETECTED
Tetrahydrocannabinol: POSITIVE — AB

## 2013-05-03 LAB — CBC
HCT: 35.9 % — ABNORMAL LOW (ref 39.0–52.0)
Hemoglobin: 12 g/dL — ABNORMAL LOW (ref 13.0–17.0)
MCH: 31.2 pg (ref 26.0–34.0)
MCHC: 33.4 g/dL (ref 30.0–36.0)
MCV: 93.2 fL (ref 78.0–100.0)
PLATELETS: 298 10*3/uL (ref 150–400)
RBC: 3.85 MIL/uL — ABNORMAL LOW (ref 4.22–5.81)
RDW: 14.1 % (ref 11.5–15.5)
WBC: 7.1 10*3/uL (ref 4.0–10.5)

## 2013-05-03 LAB — LIPID PANEL
Cholesterol: 135 mg/dL (ref 0–200)
HDL: 98 mg/dL (ref >39)
LDL Cholesterol: 30 mg/dL (ref 0–99)
Total CHOL/HDL Ratio: 1.4 RATIO
Triglycerides: 33 mg/dL (ref <150)
VLDL: 7 mg/dL (ref 0–40)

## 2013-05-03 LAB — POCT I-STAT TROPONIN I: Troponin i, poc: 0.01 ng/mL (ref 0.00–0.08)

## 2013-05-03 LAB — ETHANOL: ALCOHOL ETHYL (B): 30 mg/dL — AB (ref 0–11)

## 2013-05-03 LAB — TROPONIN I: Troponin I: <0.3 ng/mL (ref <0.30)

## 2013-05-03 MED ORDER — ONDANSETRON HCL 4 MG/2ML IJ SOLN: 4.0000 mg | Freq: Four times a day (QID) | INTRAMUSCULAR | Status: DC | PRN

## 2013-05-03 MED ORDER — GI COCKTAIL ~~LOC~~
30.0000 mL | Freq: Four times a day (QID) | ORAL | Status: DC | PRN
  Administered 2013-05-03: 30 mL via ORAL
  Filled 2013-05-03: qty 30

## 2013-05-03 MED ORDER — SODIUM CHLORIDE 0.9 % IV SOLN: INTRAVENOUS | Status: DC

## 2013-05-03 MED ORDER — PANTOPRAZOLE SODIUM 40 MG PO TBEC
40.0000 mg | DELAYED_RELEASE_TABLET | Freq: Every day | ORAL | Status: DC
  Administered 2013-05-03 – 2013-05-04 (×2): 40 mg via ORAL
  Filled 2013-05-03 (×2): qty 1

## 2013-05-03 MED ORDER — NITROGLYCERIN 0.4 MG SL SUBL: 0.4000 mg | SUBLINGUAL_TABLET | SUBLINGUAL | Status: DC | PRN

## 2013-05-03 MED ORDER — HEPARIN SODIUM (PORCINE) 5000 UNIT/ML IJ SOLN
5000.0000 [IU] | Freq: Three times a day (TID) | INTRAMUSCULAR | Status: DC
  Administered 2013-05-04 (×2): 5000 [IU] via SUBCUTANEOUS
  Filled 2013-05-03 (×5): qty 1

## 2013-05-03 MED ORDER — LORAZEPAM 2 MG/ML IJ SOLN: 1.0000 mg | Freq: Four times a day (QID) | INTRAMUSCULAR | Status: DC | PRN

## 2013-05-03 MED ORDER — ASPIRIN EC 81 MG PO TBEC
81.0000 mg | DELAYED_RELEASE_TABLET | Freq: Every day | ORAL | Status: DC
Start: 2013-05-04 — End: 2013-05-04
  Filled 2013-05-03: qty 1

## 2013-05-03 MED ORDER — THIAMINE HCL 100 MG/ML IJ SOLN
Freq: Once | INTRAVENOUS | Status: AC
  Administered 2013-05-03: via INTRAVENOUS
  Filled 2013-05-03: qty 1000

## 2013-05-03 MED ORDER — ACETAMINOPHEN 325 MG PO TABS
650.0000 mg | ORAL_TABLET | ORAL | Status: DC | PRN
  Administered 2013-05-04: 650 mg via ORAL
  Filled 2013-05-03: qty 2

## 2013-05-03 MED ORDER — HYDROCHLOROTHIAZIDE 25 MG PO TABS
25.0000 mg | ORAL_TABLET | Freq: Every day | ORAL | Status: DC
  Administered 2013-05-03 – 2013-05-04 (×2): 25 mg via ORAL
  Filled 2013-05-03 (×2): qty 1

## 2013-05-03 MED ORDER — MORPHINE SULFATE 4 MG/ML IJ SOLN
4.0000 mg | Freq: Once | INTRAMUSCULAR | Status: AC
  Administered 2013-05-03: 4 mg via INTRAVENOUS
  Filled 2013-05-03: qty 1

## 2013-05-03 MED ORDER — NITROGLYCERIN 2 % TD OINT
1.0000 [in_us] | TOPICAL_OINTMENT | Freq: Once | TRANSDERMAL | Status: AC
  Administered 2013-05-03: 1 [in_us] via TOPICAL
  Filled 2013-05-03: qty 1

## 2013-05-03 MED ORDER — FOLIC ACID 1 MG PO TABS
1.0000 mg | ORAL_TABLET | Freq: Every day | ORAL | Status: DC
  Administered 2013-05-04: 1 mg via ORAL
  Filled 2013-05-03: qty 1

## 2013-05-03 MED ORDER — MORPHINE SULFATE 2 MG/ML IJ SOLN
2.0000 mg | INTRAMUSCULAR | Status: DC | PRN
  Administered 2013-05-03 – 2013-05-04 (×2): 2 mg via INTRAVENOUS
  Filled 2013-05-03 (×2): qty 1

## 2013-05-03 MED ORDER — VITAMIN B-1 100 MG PO TABS
100.0000 mg | ORAL_TABLET | Freq: Every day | ORAL | Status: DC
  Administered 2013-05-04: 100 mg via ORAL
  Filled 2013-05-03: qty 1

## 2013-05-03 MED ORDER — LORAZEPAM 1 MG PO TABS
1.0000 mg | ORAL_TABLET | Freq: Four times a day (QID) | ORAL | Status: DC | PRN
  Administered 2013-05-03: 1 mg via ORAL
  Filled 2013-05-03: qty 1

## 2013-05-03 MED ORDER — SIMVASTATIN 40 MG PO TABS
40.0000 mg | ORAL_TABLET | Freq: Every day | ORAL | Status: DC
  Filled 2013-05-03: qty 1

## 2013-05-03 MED ORDER — KETOROLAC TROMETHAMINE 30 MG/ML IJ SOLN
30.0000 mg | Freq: Once | INTRAMUSCULAR | Status: AC
  Administered 2013-05-03: 30 mg via INTRAVENOUS
  Filled 2013-05-03: qty 1

## 2013-05-03 MED ORDER — ADULT MULTIVITAMIN W/MINERALS CH
1.0000 | ORAL_TABLET | Freq: Every day | ORAL | Status: DC
  Administered 2013-05-04: 1 via ORAL
  Filled 2013-05-03: qty 1

## 2013-05-03 MED ORDER — NAPROXEN 500 MG PO TABS
500.0000 mg | ORAL_TABLET | Freq: Two times a day (BID) | ORAL | Status: DC | PRN
  Administered 2013-05-03 – 2013-05-04 (×2): 500 mg via ORAL
  Filled 2013-05-03 (×2): qty 1

## 2013-05-03 MED ORDER — THIAMINE HCL 100 MG/ML IJ SOLN
100.0000 mg | Freq: Every day | INTRAMUSCULAR | Status: DC
  Filled 2013-05-03: qty 1

## 2013-05-03 MED ORDER — ATENOLOL 50 MG PO TABS
50.0000 mg | ORAL_TABLET | Freq: Every day | ORAL | Status: DC
  Administered 2013-05-03 – 2013-05-04 (×2): 50 mg via ORAL
  Filled 2013-05-03 (×2): qty 1
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
## 2013-05-03 NOTE — ED Notes (Signed)
Chest pain woke him up at 1100; in center of left chest and increases to palpation or movement of left arm. States also hurts in right arm and upper back. Reports nausea, no vomiting. BP 220/110 when EMS arrived. Given 324mg  ASA, 4mg  Zofran, and 2 SL Nitro tabs. Reports pain still a 10/10. Reports has not taken his medication for BP in 2 months.

## 2013-05-03 NOTE — ED Provider Notes (Signed)
Medical screening examination/treatment/procedure(s) were performed by non-physician practitioner and as supervising physician I was immediately available for consultation/collaboration.  EKG Interpretation    Date/Time:  Saturday May 03 2013 14:16:55 EST Ventricular Rate:  99 PR Interval:  149 QRS Duration: 75 QT Interval:  353 QTC Calculation: 453 R Axis:   71 Text Interpretation:  Sinus rhythm Probable left atrial enlargement Abnormal T, consider ischemia, diffuse leads Baseline wander in lead(s) V3 , new T wave inversion Inferior leads Confirmed by Maryan Rued  MD, Seairra Otani ([id number]) on 05/03/2013 2:23:15 PM              Blanchie Dessert, MD 05/03/13 5520
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
## 2013-05-03 NOTE — ED Notes (Signed)
MD at bedside. (Internal Medicine). 

## 2013-05-03 NOTE — ED Notes (Signed)
Patient transported to X-ray 

## 2013-05-04 ENCOUNTER — Encounter (HOSPITAL_COMMUNITY): Payer: Self-pay | Admitting: *Deleted

## 2013-05-04 DIAGNOSIS — F17200 Nicotine dependence, unspecified, uncomplicated: Secondary | ICD-10-CM

## 2013-05-04 DIAGNOSIS — Z5900 Homelessness unspecified: Secondary | ICD-10-CM

## 2013-05-04 DIAGNOSIS — E876 Hypokalemia: Secondary | ICD-10-CM

## 2013-05-04 LAB — TROPONIN I: Troponin I: <0.3 ng/mL (ref <0.30)

## 2013-05-04 LAB — BASIC METABOLIC PANEL
BUN: 9 mg/dL (ref 6–23)
CO2: 25 mEq/L (ref 19–32)
Calcium: 8.4 mg/dL (ref 8.4–10.5)
Chloride: 99 mEq/L (ref 96–112)
Creatinine, Ser: 1 mg/dL (ref 0.50–1.35)
GFR calc Af Amer: >90 mL/min (ref >90)
GFR calc non Af Amer: 85 mL/min — ABNORMAL LOW (ref >90)
GLUCOSE: 148 mg/dL — AB (ref 70–99)
POTASSIUM: 3.3 meq/L — AB (ref 3.7–5.3)
SODIUM: 137 meq/L (ref 137–147)

## 2013-05-04 LAB — MAGNESIUM: Magnesium: 1.8 mg/dL (ref 1.5–2.5)

## 2013-05-04 MED ORDER — HYDROCHLOROTHIAZIDE 25 MG PO TABS: 25.0000 mg | ORAL_TABLET | Freq: Every day | ORAL | Status: DC

## 2013-05-04 MED ORDER — ASPIRIN EC 81 MG PO TBEC: 81.0000 mg | DELAYED_RELEASE_TABLET | Freq: Every day | ORAL | Status: DC

## 2013-05-04 MED ORDER — NAPROXEN 500 MG PO TABS: 500.0000 mg | ORAL_TABLET | Freq: Two times a day (BID) | ORAL | Status: DC | PRN

## 2013-05-04 MED ORDER — PRAVASTATIN SODIUM 40 MG PO TABS: 40.0000 mg | ORAL_TABLET | Freq: Every day | ORAL | Status: DC

## 2013-05-04 MED ORDER — ATENOLOL 50 MG PO TABS: 50.0000 mg | ORAL_TABLET | Freq: Every day | ORAL | Status: DC

## 2013-05-04 MED ORDER — PANTOPRAZOLE SODIUM 40 MG PO TBEC: 40.0000 mg | DELAYED_RELEASE_TABLET | Freq: Every day | ORAL | Status: DC

## 2013-05-04 MED ORDER — PNEUMOCOCCAL VAC POLYVALENT 25 MCG/0.5ML IJ INJ
0.5000 mL | INJECTION | INTRAMUSCULAR | Status: DC
  Filled 2013-05-04 (×2): qty 0.5

## 2013-05-04 MED ORDER — FOLIC ACID 1 MG PO TABS: 1.0000 mg | ORAL_TABLET | Freq: Every day | ORAL | Status: DC

## 2013-05-04 MED ORDER — INFLUENZA VAC SPLIT QUAD 0.5 ML IM SUSP
0.5000 mL | INTRAMUSCULAR | Status: DC
  Filled 2013-05-04: qty 0.5

## 2013-05-04 MED ORDER — NITROGLYCERIN 0.4 MG SL SUBL: 0.4000 mg | SUBLINGUAL_TABLET | SUBLINGUAL | Status: DC | PRN

## 2013-05-04 MED ORDER — POTASSIUM CHLORIDE CRYS ER 20 MEQ PO TBCR
40.0000 meq | EXTENDED_RELEASE_TABLET | ORAL | Status: AC
  Administered 2013-05-04 (×2): 40 meq via ORAL
  Filled 2013-05-04 (×2): qty 2

## 2013-05-04 MED ORDER — ADULT MULTIVITAMIN W/MINERALS CH: 1.0000 | ORAL_TABLET | Freq: Every day | ORAL | Status: DC

## 2013-05-04 MED ORDER — THIAMINE HCL 100 MG PO TABS: 100.0000 mg | ORAL_TABLET | Freq: Every day | ORAL | Status: DC

## 2013-05-04 NOTE — Progress Notes (Signed)
Utilization Review completed.  

## 2013-05-04 NOTE — Progress Notes (Signed)
   CARE MANAGEMENT NOTE 05/04/2013  Patient:  YONATAN, GUITRON   Account Number:  [id number]  Date Initiated:  05/04/2013  Documentation initiated by:  [facility]  Subjective/Objective Assessment:   adm: Atypical chest pain     Action/Plan:   discahrge planning   Anticipated DC Date:  05/11/2013   Anticipated DC Plan:  [facility]  CM consult      Choice offered to / List presented to:             Status of service:  Completed, signed off Medicare Important Message given?   (If response is "NO", the following Medicare IM given date fields will be blank) Date Medicare IM given:   Date Additional Medicare IM given:    Discharge Disposition:  HOME/SELF CARE  Per UR Regulation:    If discussed at Long Length of Stay Meetings, dates discussed:    Comments:  05/04/12 14:30 CM spoke with MD and explained this pt is homeless and does not have any money (even for [facility]).  I explained to ?Dr Lucila Maine about the [facility] able to provide PCP, followup but bc a PCP was assigned to him, maybe not prescriptions filled.  PCP which is listed is the MD who will follow up with pt tomorow.  SW arranged for bus transportation to where the pt is staying, and transportation back to [facility].  Patient is to bring prescriptions back with him to followup appt and have follow up MD call to [facility] to arrange for indigent prescriptions fill.  No further CM needs wer communicated.  Mariane Masters, BSN, CM [phone].

## 2013-05-04 NOTE — Discharge Instructions (Signed)
It was a pleasure taking care of you. - Please follow up in the clinic tomorrow at 1:45pm. Located in the basement of [facility]. We have provided you with a few free bus passes to make it easier for you to get to the clinic. - Please take Naproxen for your chest wall pain. We have ruled out a heart attack. - We have given you a MATCH letter to make your medications more affordable. Please bring this to the pharmacy with your paper prescriptions. In the clinic, we can work with you to get you health insurance. - Please return to the ED if you develop sudden fever, chills, worsening chest pain, shortness of breath.  Items required to complete an Eligibility Application   1. Picture ID (Can't be expired) 2. Current Bill to establish proof of residency 3. W-2 & Tax return (if self-employed include "Schedule C"), if not filing Form 4506 4. 4 current Pay stubs for this year 5. Printout of other income (Social security, unemployment, child support, workmen's comp) 6. Food stamp award letter, if receiving  7. Life Insurance (Need copy of the front page, showing name Ins Co. Name, and face amount). 8. Statement for pension, 401-K, IRS (needs to have current balance) 9. Tax Value for cars, houses, mobile homes, and land (Get from [facility] [facility] [employer] Tax Department) 10. Disability Paperwork (showing status of case) 11. College students: Print out of [place] received, tuition cost, books, etc. 12. If no Income: Games developer of support for free shelter, money, food, Social research officer, government.  Bring all that you can to your follow up appointment to start the process.

## 2013-05-04 NOTE — Discharge Summary (Signed)
Name: Devin [name] MRN: [id number] DOB: Sep 19, 1960 53 y.o. PCP: Corky Sox, MD  Date of Admission: 05/03/2013  2:09 PM Date of Discharge: 05/04/2013 Attending Physician: Karren Cobble, MD  Discharge Diagnosis:   Atypical chest pain Active Problems:   ALCOHOL ABUSE   HYPERTENSION, MODERATE   Hyperlipidemia LDL goal < 70   Tobacco abuse   Chest pain   Marijuana abuse   Homelessness   H/O medication noncompliance   Hypokalemia  Discharge Medications:   Medication List         aspirin EC 81 MG tablet  Take 1 tablet (81 mg total) by mouth daily.     atenolol 50 MG tablet  Commonly known as:  TENORMIN  Take 1 tablet (50 mg total) by mouth daily.     folic acid 1 MG tablet  Commonly known as:  FOLVITE  Take 1 tablet (1 mg total) by mouth daily.     hydrochlorothiazide 25 MG tablet  Commonly known as:  HYDRODIURIL  Take 1 tablet (25 mg total) by mouth daily.     multivitamin with minerals Tabs tablet  Take 1 tablet by mouth daily.     naproxen 500 MG tablet  Commonly known as:  NAPROSYN  Take 1 tablet (500 mg total) by mouth 2 (two) times daily as needed for mild pain or moderate pain.     nitroGLYCERIN 0.4 MG SL tablet  Commonly known as:  NITROSTAT  Place 1 tablet (0.4 mg total) under the tongue every 5 (five) minutes as needed for chest pain.     pantoprazole 40 MG tablet  Commonly known as:  PROTONIX  Take 1 tablet (40 mg total) by mouth daily.     pravastatin 40 MG tablet  Commonly known as:  PRAVACHOL  Take 1 tablet (40 mg total) by mouth daily.     PREPARATION H 0.25-50 % Gel  Generic drug:  Phenylephrine-Witch Hazel  Place 1 application rectally daily as needed (for hemorrhoids).     thiamine 100 MG tablet  Take 1 tablet (100 mg total) by mouth daily.        Disposition and follow-up:   Devin [name] was discharged from [facility] in Stable condition.  At the hospital follow up visit please address:  1.  Chest pain  control on NSAIDs. BP control. Medication compliance (pt can gets meds for FREE at [facility] when he comes for f/u tomorrow, please make sure he has done this). Social needs (pt was given bus passes at d/c).  2.  Labs / imaging needed at time of follow-up: Repeat EKG  3.  Pending labs/ test needing follow-up: None  Follow-up Appointments: Follow-up Information   Follow up with Fransisca Kaufmann, MD On 05/05/2013. (@1 :45 PM. This is your primary care doctor.)    Specialty:  Internal Medicine   Contact information:   [place] [address] [phone]       Discharge Instructions: Discharge Orders   Future Orders Complete By Expires   Diet - low sodium heart healthy  As directed    Increase activity slowly  As directed       Consultations:  None  Procedures Performed:  Dg Chest 2 View  05/03/2013   ADDENDUM REPORT: 05/03/2013 16:22  ADDENDUM: Impression should read:  No acute cardiopulmonary abnormality.   Electronically Signed   By: Skipper Cliche M.D.   On: 05/03/2013 16:22   05/03/2013   CLINICAL DATA:  Chest pressure, tightness, chronic cough  EXAM: CHEST  2 VIEW  COMPARISON:  03/30/2013  FINDINGS: Heart size and vascular pattern are normal. Bilateral nipple shadows noted. Mild chronic deformity posterior lateral left 6th rib consistent with prior fracture, stable. No consolidation or effusion. No significant change from prior study.  IMPRESSION: moderate atrophy. Moderate low attenuation in the deep white matter. Evidence of stable chronic right basal ganglia lacunar infarction.  Electronically Signed: By: Skipper Cliche M.D. On: 05/03/2013 15:32    Admission HPI:  Devin [name] is a 53 y.o. male [facility] patient with PMH atypical chest pain (s/p 2 normal caths, most recently in 2007), HTN, HLD, polysubstance abuse, homelessnessness, who presents to the ED with chest pain.   Patient reports he started having left-sided chest pain this morning at 11am. He describes  it as a "squeezing" pain, 10/10, radiating to his back and down his right arm. It is also very tender to palpation. No recent trauma or falls. This is similar to prior episodes of chest pain he has had. He reports shortness of breath and cough productive of "thick" sputum, but this is longstanding. Denies abdominal pain, nausea, vomiting, diaphoresis. He tells [place] he has not been taking his medications over the last couple months, including his blood pressure medicine, but he has been taking a baby aspirin.   Patient denies tobacco use and illicit drug use. He says he drinks about 2 "40s" of beer per week. He tells [place] his last drink was on Thursday.   He follows with Dr. Acie Fredrickson in cardiology, who last saw him in December 2013. At that time he recommended against repeat coronary catheterization. Devin [name] is a chronic no-show patient in our clinic. Dr. Sandy Salaam was his PCP and attempted to contact him in March of 2014 for follow up, but the patient's personal phone number was no longer active. She contacted multiple area pharmacies, none of which had recent refills on file for him. [place] has also tried to contact him for an appointment by phone and letter many times without luck. He tells [place] he is living with a friend right now, but the best address and phone number with which to contact him is his church. He then provided [place] with the same contact information already in EPIC.   EMS provided 324mg  ASA, 4mg  Zofran, and 2 SL NTG tabs which did not provide relief. In the ED he received Morphine 4 mg IV x2 and 1 inch of NTG ointment.  Physical Exam:  Blood pressure 162/105, pulse 96, temperature 98.5 F (36.9 C), temperature source Oral, resp. rate 19, height 5\' 10"  (1.778 m), weight 150 lb (68.04 kg), SpO2 100.00%.  Physical Exam  Constitutional: He is oriented to person, place, and time and well-developed, well-nourished, and in no distress.  Appears intoxicated.  HENT:  Head: Normocephalic and  atraumatic.  Eyes: Conjunctivae and EOM are normal. Pupils are equal, round, and reactive to light.  Neck: Normal range of motion. Neck supple.  Cardiovascular: Regular rhythm and normal heart sounds. Tachycardia present. Exam reveals no gallop and no friction rub.  No murmur heard.  Pulmonary/Chest: Effort normal and breath sounds normal. No respiratory distress. He has no wheezes. He has no rales. He exhibits tenderness (Patient equisitely tender to light palpation of left sternal border, jumping off bed and screaming.).  Abdominal: Soft. He exhibits no distension. There is no tenderness.  Musculoskeletal: Normal range of motion. He exhibits no edema and no tenderness.  Neurological: He is  alert and oriented to person, place, and time. GCS score is 15.  Skin: Skin is warm and dry. He is not diaphoretic.   Hospital Course by problem list:   Devin [name] is a 54 y.o. male [facility] patient with PMH atypical chest pain (s/p 2 normal caths, most recently in 2007), HTN, HLD, polysubstance abuse, homelessnessness, who presents to the ED with chest pain.   1. Atypical chest pain - Patient has a history of atypical chest pain. Myoview stress test in 2007 showed anteroseptal wall ischemia, however subsequent catheterization revealed clean coronaries. His cardiologist Dr. Acie Fredrickson recommended against repeat coronary catheterization in December 2013 after evaluating him in the office. Echocardiogram at that time was within normal limits. Here, patient again presented with atypical chest pain, notable for exquisite chest wall tenderness and radiation to the back and right arm. CXR with no abnormality. However the patient had new T wave inversions in the inferior and lateral distributions on his EKG, concerning for ischemia. TIMI score = 2 for >3 CAD risk factors and recent ASA use: 8% risk at 14 days of: all-cause mortality, new or recurrent MI, or severe recurrent ischemia requiring urgent revascularization. We  therefore ruled out MI with serial troponins, which were negative x3. His pain improved with NSAIDs and morphine overnight. Repeat EKG still showed some T wave inversions in the inferior and lateral distribution, less severe than on admission. He will need a repeat EKG as an outpatient. Follow up has been an issue for this patient. Therefore an appointment was made for 1:45 PM tomorrow with Dr. Burnard Bunting. The patient was also given an extra bus pass to help him with transportation to this appointment.  2. Hypertension - Well controlled here on home medications - Atenolol 50 mg daily and HCTZ 25 mg daily - which he tells [place] he has not been compliant with. Care management was consulted and patient was given a [place] letter on discharge to help him with affording his basic medications.  3. HLD - Under control despite medication noncompliance, see lipid panel below. We continued Simvastatin 40 mg daily.   Lipid Panel    Component  Value  Date/Time    CHOL  135  05/03/2013 2100    TRIG  33  05/03/2013 2100    HDL  98  05/03/2013 2100    CHOLHDL  1.4  05/03/2013 2100    VLDL  7  05/03/2013 2100    LDLCALC  30  05/03/2013 2100    4. Polysubstance abuse - Patient denied substance use to [place]. However UDS was positive for marijuana, negative for other substances. Ethanol level was 30 (high). Patient appeared intoxicated on admission. Highest CIWA here was 6, for which he got 1 mg Ativan. On the morning of discharge he was not agitated, tachycardic, or tremulous. Hepatic function panel showed elevated AST at 59, ALT 39, alcoholic pattern. Albumin 3.3. We discharged him with a prescription for Folate, Thiamine, and a MV daily. The nurse provided tobacco cessation education.  5. Social issues - Patient has multiple social issues. He has a history of homelessness, but tells [place] he is currently living with a friend. He tells [place] the best address and phone number with which to contact him is his church; information already in EPIC.  He does not have a car and can't afford a bus pass. He says this has limited his ability to get to the pharmacy and the clinic. Social work provided bus passes to the patient, one  for transportation home and a second for transportation to the clinic tomorrow. Care management was going to provide the patient with a [place] letter to defer the costs of his medications, but he could not afford the $3 co-pay. Therefore care management set him up with a program through the [facility], whereby he can get ALL of his medications for completely FREE when he comes back to [facility] for his follow up appointment tomorrow. He just has to stop by the [facility].   Discharge Vitals:   BP 117/80  Pulse 77  Temp(Src) 98.4 F (36.9 C) (Oral)  Resp 21  Ht 5\' 10"  (1.778 m)  Wt 155 lb 13.8 oz (70.7 kg)  BMI 22.36 kg/m2  SpO2 100%  Discharge Labs:  Results for orders placed during the hospital encounter of 05/03/13 (from the past 24 hour(s))  URINE RAPID DRUG SCREEN (HOSP PERFORMED)     Status: Abnormal   Collection Time    05/03/13  2:36 PM      Result Value Range   Opiates NONE DETECTED  NONE DETECTED   Cocaine NONE DETECTED  NONE DETECTED   Benzodiazepines NONE DETECTED  NONE DETECTED   Amphetamines NONE DETECTED  NONE DETECTED   Tetrahydrocannabinol POSITIVE (*) NONE DETECTED   Barbiturates NONE DETECTED  NONE DETECTED  CBC     Status: Abnormal   Collection Time    05/03/13  3:26 PM      Result Value Range   WBC 7.1  4.0 - 10.5 K/uL   RBC 3.85 (*) 4.22 - 5.81 MIL/uL   Hemoglobin 12.0 (*) 13.0 - 17.0 g/dL   HCT 35.9 (*) 39.0 - 52.0 %   MCV 93.2  78.0 - 100.0 fL   MCH 31.2  26.0 - 34.0 pg   MCHC 33.4  30.0 - 36.0 g/dL   RDW 14.1  11.5 - 15.5 %   Platelets 298  150 - 400 K/uL  BASIC METABOLIC PANEL     Status: None   Collection Time    05/03/13  3:26 PM      Result Value Range   Sodium 139  137 - 147 mEq/L   Potassium 4.2  3.7 - 5.3 mEq/L   Chloride 101  96 - 112 mEq/L   CO2 25  19  - 32 mEq/L   Glucose, Bld 82  70 - 99 mg/dL   BUN 7  6 - 23 mg/dL   Creatinine, Ser 0.80  0.50 - 1.35 mg/dL   Calcium 9.0  8.4 - 10.5 mg/dL   GFR calc non Af Amer >90  >90 mL/min   GFR calc Af Amer >90  >90 mL/min  POCT I-STAT TROPONIN I     Status: None   Collection Time    05/03/13  3:42 PM      Result Value Range   Troponin i, poc 0.01  0.00 - 0.08 ng/mL   Comment 3           ETHANOL     Status: Abnormal   Collection Time    05/03/13  5:23 PM      Result Value Range   Alcohol, Ethyl (B) 30 (*) 0 - 11 mg/dL  TROPONIN I     Status: None   Collection Time    05/03/13  7:35 PM      Result Value Range   Troponin I <0.30  <0.30 ng/mL  LIPID PANEL     Status: None   Collection Time  05/03/13  9:00 PM      Result Value Range   Cholesterol 135  0 - 200 mg/dL   Triglycerides 33  <150 mg/dL   HDL 98  >39 mg/dL   Total CHOL/HDL Ratio 1.4     VLDL 7  0 - 40 mg/dL   LDL Cholesterol 30  0 - 99 mg/dL  HEPATIC FUNCTION PANEL     Status: Abnormal   Collection Time    05/03/13  9:00 PM      Result Value Range   Total Protein 7.4  6.0 - 8.3 g/dL   Albumin 3.3 (*) 3.5 - 5.2 g/dL   AST 59 (*) 0 - 37 U/L   ALT 39  0 - 53 U/L   Alkaline Phosphatase 92  39 - 117 U/L   Total Bilirubin 0.4  0.3 - 1.2 mg/dL   Bilirubin, Direct <0.2  0.0 - 0.3 mg/dL   Indirect Bilirubin NOT CALCULATED  0.3 - 0.9 mg/dL  BASIC METABOLIC PANEL     Status: Abnormal   Collection Time    05/04/13  2:32 AM      Result Value Range   Sodium 137  137 - 147 mEq/L   Potassium 3.3 (*) 3.7 - 5.3 mEq/L   Chloride 99  96 - 112 mEq/L   CO2 25  19 - 32 mEq/L   Glucose, Bld 148 (*) 70 - 99 mg/dL   BUN 9  6 - 23 mg/dL   Creatinine, Ser 1.00  0.50 - 1.35 mg/dL   Calcium 8.4  8.4 - 10.5 mg/dL   GFR calc non Af Amer 85 (*) >90 mL/min   GFR calc Af Amer >90  >90 mL/min  TROPONIN I     Status: None   Collection Time    05/04/13  2:32 AM      Result Value Range   Troponin I <0.30  <0.30 ng/mL  TROPONIN I     Status:  None   Collection Time    05/04/13  9:13 AM      Result Value Range   Troponin I <0.30  <0.30 ng/mL  MAGNESIUM     Status: None   Collection Time    05/04/13  9:37 AM      Result Value Range   Magnesium 1.8  1.5 - 2.5 mg/dL    Signed: Lesly Dukes, MD 05/04/2013, 12:36 PM   Time Spent on Discharge: 35 minutes Services Ordered on Discharge: None Equipment Ordered on Discharge: None

## 2013-05-04 NOTE — Progress Notes (Signed)
CSW notified to assist with transportation and homeless issues. Patient provided 2 bus passes- one to get home today and one to follow up in clinic tomorrow.  Patient has been homeless but is currently staying with a friend. Per MD- patient will follow up with Clinic staff tomorrow for further needs.  MD requested Match program for patient's medications. CSW notified Mariane Masters, [facility] of this and she will follow up. No further CSW needs identified. CSW signing off.  Lorie Phenix. [place], [place]

## 2013-05-04 NOTE — Progress Notes (Signed)
Subjective: Patient seen and examined at the bedside this morning. He is still having chest wall pain, but it improved overnight with his pain medicine. He denies shortness of breath, abdominal pain, nausea, vomiting. He is amenable to following up in the internal medicine clinic on Monday afternoon.  Objective: Vital signs in last 24 hours: Filed Vitals:   05/03/13 1830 05/03/13 1839 05/03/13 1918 05/04/13 0419  BP: 162/105 162/105 159/98 117/80  Pulse: 97 96 106 77  Temp:   98.9 F (37.2 C) 98.4 F (36.9 C)  TempSrc:   Oral Oral  Resp: 17 19 20 21   Height:      Weight:   155 lb 13.8 oz (70.7 kg)   SpO2: 100% 100% 100% 100%   Weight change:   Intake/Output Summary (Last 24 hours) at 05/04/13 1607 Last data filed at 05/04/13 0700  Gross per 24 hour  Intake 889.58 ml  Output    330 ml  Net 559.58 ml   Physical Exam  Constitutional: He is oriented to person, place, and time and well-developed, well-nourished, and in no distress.  HENT:  Head: Normocephalic and atraumatic.  Eyes: Conjunctivae and EOM are normal. Pupils are equal, round, and reactive to light.  Neck: Normal range of motion. Neck supple.  Cardiovascular: Regular rate and rhythm and normal heart sounds. Exam reveals no gallop and no friction rub.  No murmur heard.  Pulmonary/Chest: Effort normal and breath sounds normal. No respiratory distress. He has no wheezes. He has no rales. He exhibits tenderness (Patient equisitely tender to light palpation of left sternal border) Abdominal: Soft. He exhibits no distension. There is no tenderness.  Musculoskeletal: Normal range of motion. He exhibits no edema and no tenderness.  Neurological: He is alert and oriented to person, place, and time. GCS score is 15.  Skin: Skin is warm and dry. He is not diaphoretic.   Lab Results: Basic Metabolic Panel:  Recent Labs Lab 05/03/13 1526 05/04/13 0232  NA 139 137  K 4.2 3.3*  CL 101 99  CO2 25 25  GLUCOSE 82 148*    BUN 7 9  CREATININE 0.80 1.00  CALCIUM 9.0 8.4   Liver Function Tests:  Recent Labs Lab 05/03/13 2100  AST 59*  ALT 39  ALKPHOS 92  BILITOT 0.4  PROT 7.4  ALBUMIN 3.3*   No results found for this basename: LIPASE, AMYLASE,  in the last 168 hours No results found for this basename: AMMONIA,  in the last 168 hours CBC:  Recent Labs Lab 05/03/13 1526  WBC 7.1  HGB 12.0*  HCT 35.9*  MCV 93.2  PLT 298   Cardiac Enzymes:  Recent Labs Lab 05/03/13 1935 05/04/13 0232  TROPONINI <0.30 <0.30   BNP: No results found for this basename: PROBNP,  in the last 168 hours D-Dimer: No results found for this basename: DDIMER,  in the last 168 hours CBG: No results found for this basename: GLUCAP,  in the last 168 hours Hemoglobin A1C: No results found for this basename: HGBA1C,  in the last 168 hours Fasting Lipid Panel:  Recent Labs Lab 05/03/13 2100  CHOL 135  HDL 98  LDLCALC 30  TRIG 33  CHOLHDL 1.4   Thyroid Function Tests: No results found for this basename: TSH, T4TOTAL, FREET4, T3FREE, THYROIDAB,  in the last 168 hours Coagulation: No results found for this basename: LABPROT, INR,  in the last 168 hours Anemia Panel: No results found for this basename: VITAMINB12, FOLATE, FERRITIN, TIBC,  IRON, RETICCTPCT,  in the last 168 hours Urine Drug Screen: Drugs of Abuse     Component Value Date/Time   LABOPIA NONE DETECTED 05/03/2013 1436   LABOPIA NEG 06/08/2010 1600   COCAINSCRNUR NONE DETECTED 05/03/2013 1436   COCAINSCRNUR NEG 06/08/2010 1600   LABBENZ NONE DETECTED 05/03/2013 1436   LABBENZ NEG 06/08/2010 1600   AMPHETMU NONE DETECTED 05/03/2013 1436   AMPHETMU NEG 06/08/2010 1600   THCU POSITIVE* 05/03/2013 1436   LABBARB NONE DETECTED 05/03/2013 1436    Alcohol Level:  Recent Labs Lab 05/03/13 1723  ETH 30*   Urinalysis: No results found for this basename: COLORURINE, APPERANCEUR, LABSPEC, PHURINE, GLUCOSEU, HGBUR, BILIRUBINUR, KETONESUR, PROTEINUR, UROBILINOGEN,  NITRITE, LEUKOCYTESUR,  in the last 168 hours Misc. Labs:   Micro Results: No results found for this or any previous visit (from the past 240 hour(s)). Studies/Results: Dg Chest 2 View  05/03/2013   ADDENDUM REPORT: 05/03/2013 16:22  ADDENDUM: Impression should read:  No acute cardiopulmonary abnormality.   Electronically Signed   By: Skipper Cliche M.D.   On: 05/03/2013 16:22   05/03/2013   CLINICAL DATA:  Chest pressure, tightness, chronic cough  EXAM: CHEST  2 VIEW  COMPARISON:  03/30/2013  FINDINGS: Heart size and vascular pattern are normal. Bilateral nipple shadows noted. Mild chronic deformity posterior lateral left 6th rib consistent with prior fracture, stable. No consolidation or effusion. No significant change from prior study.  IMPRESSION: moderate atrophy. Moderate low attenuation in the deep white matter. Evidence of stable chronic right basal ganglia lacunar infarction.  Electronically Signed: By: Skipper Cliche M.D. On: 05/03/2013 15:32   Medications: I have reviewed the patient's current medications. Scheduled Meds: . aspirin EC  81 mg Oral Daily  . atenolol  50 mg Oral Daily  . folic acid  1 mg Oral Daily  . heparin  5,000 Units Subcutaneous Q8H  . hydrochlorothiazide  25 mg Oral Daily  . [START ON 05/05/2013] influenza vac split quadrivalent PF  0.5 mL Intramuscular Tomorrow-1000  . multivitamin with minerals  1 tablet Oral Daily  . pantoprazole  40 mg Oral Daily  . [START ON 05/05/2013] pneumococcal 23 valent vaccine  0.5 mL Intramuscular Tomorrow-1000  . simvastatin  40 mg Oral q1800  . thiamine  100 mg Oral Daily   Or  . thiamine  100 mg Intravenous Daily   Continuous Infusions: . sodium chloride     PRN Meds:.acetaminophen, gi cocktail, LORazepam, LORazepam, morphine injection, naproxen, nitroGLYCERIN, ondansetron (ZOFRAN) IV Assessment/Plan: Devin [name] is a 53 y.o. male [facility] patient with PMH atypical chest pain (s/p 2 normal caths, most recently in 2007), HTN,  HLD, polysubstance abuse, homelessnessness, who presents to the ED with chest pain.   #Atypical chest pain - Patient again presents with atypical chest pain, notable for chest wall tenderness and radiation to the back and right arm. His pain improved with NSAIDs overnight. Repeat EKG still shows some T wave inversions in the inferior and lateral distribution, less severe than yesterday. Troponins are negative x2. If his final troponin is negative, we will discharge with close followup in the internal medicine clinic tomorrow. Appointment has already been made for 1:45 PM with Dr. Burnard Bunting since it is hard to contact him. - Follow up final troponin - Aspirin 81 mg daily  - Protonix 40 mg po daily  - GI cocktail 4 times daily when necessary  - Sublingual nitroglycerin every 5 mins PRN for chest pain  - Naproxen 500mg  BID prn  for pain  - Tylenol 650 mg by mouth every 4 hours prn for pain  - Morphine 2 mg IV every 4 hours prn severe pain  - Zofran 4 mg IV or PO every 6 hours prn nausea  - Heart healthy diet  - Oxygen therapy when necessary  - Cardiac monitoring   #Hypertension - Well controlled on home medications.  - Atenolol 50 mg daily  - HCTZ 25 mg daily  - Continue to monitor   #HLD - Under control, see below. - Simvastatin 40 mg daily   Lipid Panel     Component Value Date/Time   CHOL 135 05/03/2013 2100   TRIG 33 05/03/2013 2100   HDL 98 05/03/2013 2100   CHOLHDL 1.4 05/03/2013 2100   VLDL 7 05/03/2013 2100   Mutual 30 05/03/2013 2100    #Polysubstance abuse - Patient denied substance use to me. However UDS was positive for marijuana, negative for other substances. Ethanol level was 30 (high). Highest CIWA overnight was 6, for which he got 1 mg Ativan. This morning he is not agitated, tachycardic, or tremulous. Hepatic function panel shows elevated AST at 59, ALT 39, alcoholic pattern. Albumin 3.3. - CIWA protocol  - Folate/thiamine/MV daily  - Nurse to provide tobacco cessation education    #Social issues - Patient has a history of homelessness. He tells [place] he is living with a friend currently. He tells [place] the best address and phone number with which to contact him is his church; information already in [employer]. He does not have a car and can't afford a bus pass. He says this has limited his ability to get to the pharmacy and the clinic. - Social work consult for disposition, medication assistance, transportation assistance  - Butch Penny ([place]) will provide bus passes to the patient, one for transportation home and a second for transportation to the clinic tomorrow - Butch Penny will also contact the care manager about a [place] letter for the patient so that he can get his medications for free/low cost  #DVT PPX - Subcutaneous heparin and SCDs   Dispo: Disposition is deferred at this time, awaiting improvement of current medical problems.  Anticipated discharge in approximately 0-1 day(s).   The patient does have a current PCP Corky Sox, MD) and does need an [facility] hospital follow-up appointment after discharge.  The patient does not have transportation limitations that hinder transportation to clinic appointments.  .Services Needed at time of discharge: Y = Yes, Blank = No PT:   OT:   RN:   Equipment:   Other:     LOS: 1 day   Lesly Dukes, MD 05/04/2013, 7:12 AM

## 2013-05-04 NOTE — H&P (Signed)
Internal Medicine Attending Admission Note Date: 05/04/2013  Patient name: Devin [name] Medical record number: [id number] Date of birth: 1960/09/10 Age: 53 y.o. Gender: male  I saw and evaluated the patient. I reviewed the resident's note and I agree with the resident's findings and plan as documented in the resident's note.  Devin [name] is a 53 year old man with a history of polysubstance abuse, hypertension, hyperlipidemia, and atypical chest pain who presents to the emergency department with the acute onset of left-sided chest pain described as a squeezing pain that radiates to the back and down the right arm. The pain is not associated with dizziness, diaphoresis, or nausea. He's had no recent chest wall trauma and denies any recent sick contacts. He admits that he has not been taking his blood pressure and cholesterol medicine for several months. He was admitted to the internal medicine teaching service for further evaluation.  Physical examination demonstrated exquisite left sternal chest wall tenderness to palpation. He ruled out for myocardial infarction with serial enzymes although he had some T-wave inversions which were new since November. The anti-inflammatory agents he received overnight did improve the pain.  Devin [name] is a 53 year old man who was admitted with atypical chest pain and found to have significant chest wall tenderness on exam. This pain likely represents costochondritis or some other musculoskeletal disorder. We are continuing the naproxen at 500 mg twice a day for the pain. He is to continue his daily aspirin 81 mg per day and start taking his atenolol at 50 mg daily and hydrochlorothiazide 25 mg daily. We will also treat his underlying hyperlipidemia with simvastatin at 40 mg daily. He is safe for discharge home today and has a followup appointment in the Internal Medicine Center tomorrow. He has been given a bus pass to get home today as well as a bus pass to get to clinic  tomorrow. We are currently working on a [place] latter to help continue with his medications.

## 2013-05-05 ENCOUNTER — Encounter: Payer: Self-pay | Admitting: Internal Medicine

## 2013-05-05 ENCOUNTER — Ambulatory Visit: Payer: Self-pay | Admitting: Internal Medicine

## 2013-05-05 ENCOUNTER — Ambulatory Visit (INDEPENDENT_AMBULATORY_CARE_PROVIDER_SITE_OTHER): Payer: Self-pay | Admitting: Internal Medicine

## 2013-05-05 ENCOUNTER — Ambulatory Visit (HOSPITAL_COMMUNITY)
Admission: RE | Admit: 2013-05-05 | Discharge: 2013-05-05 | Disposition: A | Discharge status: Home / Self Care | Payer: Self-pay | Source: Ambulatory Visit | Attending: Internal Medicine | Admitting: Internal Medicine

## 2013-05-05 VITALS — BP 133/80 | HR 94 | Temp 97.0°F | Ht 70.0 in | Wt 165.0 lb

## 2013-05-05 DIAGNOSIS — R0789 Other chest pain: Secondary | ICD-10-CM

## 2013-05-05 DIAGNOSIS — R079 Chest pain, unspecified: Secondary | ICD-10-CM | POA: Insufficient documentation

## 2013-05-05 DIAGNOSIS — R05 Cough: Secondary | ICD-10-CM

## 2013-05-05 DIAGNOSIS — R059 Cough, unspecified: Secondary | ICD-10-CM

## 2013-05-05 NOTE — Progress Notes (Signed)
   Subjective:    Patient ID: Devin [name], male    DOB: 02-Jan-1961, 53 y.o.   MRN: [id number]  HPI Comments: Mr. Pile is a 53 year old male with a PMH of atypical chest pain, MI in 2003, HTN, HLD and tobacco use.  He presents for hospital follow-up after admission for atypical chest pain 01/03-01/07/2013.  His EKG had new t-wave inversions but he was ruled out for ACS with serial troponins.  He had some improvement of his pain with morphine and NSAIDs and was discharged in stable condition with follow-up appointment the following day.  He is here for follow-up and repeat EKG.  At today's visit he says the chest pain is still present and has been constant for the past two days (since day of admission).  He describes the pain as 8/10, squeezing, central chest pain.  Coughing increases the pain.  He also notes pain in back and right shoulder.  He denies trauma to the chest, SOB or diaphoresis.  He has some nausea but denies vomiting.  He has also had URI symptoms of productive cough and rhinorrhea for the past week.  He has not been to the pharmacy to pick up medications and still has the prescriptions with him. He says he will go after the office visit.     Review of Systems  Constitutional: Negative for fever, chills, diaphoresis and appetite change.  HENT: Positive for rhinorrhea.   Respiratory: Positive for cough. Negative for shortness of breath.   Cardiovascular: Positive for chest pain.  Gastrointestinal: Positive for nausea. Negative for vomiting, diarrhea and constipation.  Musculoskeletal: Positive for back pain.       Objective:   Physical Exam  Vitals reviewed. Constitutional: He is oriented to person, place, and time. No distress.  HENT:  Head: Normocephalic and atraumatic.  Mouth/Throat: Oropharynx is clear and moist. No oropharyngeal exudate.  Eyes: EOM are normal. Pupils are equal, round, and reactive to light. No scleral icterus.  Neck: Neck supple.  Cardiovascular:  Normal rate, regular rhythm and normal heart sounds.   Pulmonary/Chest: Effort normal and breath sounds normal. No respiratory distress. He has no wheezes. He has no rales. He exhibits tenderness.  Extremely TTP in the central chest and on either side of the sternum, even with light palpation and with pressure from the stethoscope.  Abdominal: Soft. Bowel sounds are normal. He exhibits no distension. There is no tenderness.  Musculoskeletal: Normal range of motion. He exhibits no edema.  Neurological: He is alert and oriented to person, place, and time.  Skin: Skin is warm. No rash noted. He is not diaphoretic.  No evidence of rash or vesicles on the chest.  Psychiatric: He has a normal mood and affect. His behavior is normal.          Assessment & Plan:  Please see problem based assessment and plan.

## 2013-05-05 NOTE — Patient Instructions (Signed)
1. Your pain does not appear to be related to your heart.  Please take ibuprofen and other medications as prescribed.   2. Please take all medications as prescribed.    3. If you have worsening of your symptoms or new symptoms arise, please call the clinic ([phone]), or go to the ER immediately if symptoms are severe.   4. Come back to clinic next week for check up.

## 2013-05-06 NOTE — Assessment & Plan Note (Addendum)
Assessment:  His chest pain does not seem to be cardiac in nature given his tenderness to palpation on exam.  He denies any trauma to the chest making fracture unlikely.  His pain does not follow a dermatomal pattern and there is no rash noted making zoster unlikely.  The etiology is unclear but may be costochondritis.    Plan:  1) Ibuprofen was prescribed at discharge.   He agrees to take this medication for anti-inflammatory purposes and return to clinic in 1 week for follow-up.            2) He has been instructed to go to the ED with new or worsening symptoms.            3) He agrees to go to the [facility] with his prescriptions where he can get them for free.

## 2013-05-08 NOTE — Progress Notes (Signed)
I saw and evaluated the patient.  I personally confirmed the key portions of the history and exam documented by Dr. Wilson and I reviewed pertinent patient test results.  The assessment, diagnosis, and plan were formulated together and I agree with the documentation in the resident's note. 

## 2013-05-13 ENCOUNTER — Encounter: Payer: Self-pay | Admitting: Internal Medicine

## 2013-05-13 ENCOUNTER — Ambulatory Visit: Payer: Self-pay | Admitting: Internal Medicine

## 2013-05-14 ENCOUNTER — Ambulatory Visit: Payer: Self-pay

## 2014-04-21 ENCOUNTER — Emergency Department (HOSPITAL_COMMUNITY)
Admission: EM | Admit: 2014-04-21 | Discharge: 2014-04-21 | Disposition: A | Discharge status: Home / Self Care | Payer: Self-pay | Attending: Emergency Medicine | Admitting: Emergency Medicine

## 2014-04-21 ENCOUNTER — Encounter (HOSPITAL_COMMUNITY): Payer: Self-pay | Admitting: Emergency Medicine

## 2014-04-21 ENCOUNTER — Emergency Department (HOSPITAL_COMMUNITY): Payer: Self-pay

## 2014-04-21 DIAGNOSIS — I252 Old myocardial infarction: Secondary | ICD-10-CM | POA: Insufficient documentation

## 2014-04-21 DIAGNOSIS — E785 Hyperlipidemia, unspecified: Secondary | ICD-10-CM | POA: Insufficient documentation

## 2014-04-21 DIAGNOSIS — M25521 Pain in right elbow: Secondary | ICD-10-CM

## 2014-04-21 DIAGNOSIS — Z598 Other problems related to housing and economic circumstances: Secondary | ICD-10-CM | POA: Insufficient documentation

## 2014-04-21 DIAGNOSIS — Z590 Homelessness: Secondary | ICD-10-CM | POA: Insufficient documentation

## 2014-04-21 DIAGNOSIS — Z720 Tobacco use: Secondary | ICD-10-CM | POA: Insufficient documentation

## 2014-04-21 DIAGNOSIS — I10 Essential (primary) hypertension: Secondary | ICD-10-CM | POA: Insufficient documentation

## 2014-04-21 DIAGNOSIS — M109 Gout, unspecified: Secondary | ICD-10-CM

## 2014-04-21 DIAGNOSIS — M10021 Idiopathic gout, right elbow: Secondary | ICD-10-CM | POA: Insufficient documentation

## 2014-04-21 DIAGNOSIS — R0602 Shortness of breath: Secondary | ICD-10-CM | POA: Insufficient documentation

## 2014-04-21 DIAGNOSIS — Z79899 Other long term (current) drug therapy: Secondary | ICD-10-CM | POA: Insufficient documentation

## 2014-04-21 DIAGNOSIS — Z7982 Long term (current) use of aspirin: Secondary | ICD-10-CM | POA: Insufficient documentation

## 2014-04-21 LAB — CBC WITH DIFFERENTIAL/PLATELET
BASOS ABS: 0 10*3/uL (ref 0.0–0.1)
Basophils Relative: 0 % (ref 0–1)
EOS PCT: 1 % (ref 0–5)
Eosinophils Absolute: 0.1 10*3/uL (ref 0.0–0.7)
HEMATOCRIT: 39 % (ref 39.0–52.0)
Hemoglobin: 12.9 g/dL — ABNORMAL LOW (ref 13.0–17.0)
LYMPHS PCT: 13 % (ref 12–46)
Lymphs Abs: 1.2 10*3/uL (ref 0.7–4.0)
MCH: 31.9 pg (ref 26.0–34.0)
MCHC: 33.1 g/dL (ref 30.0–36.0)
MCV: 96.5 fL (ref 78.0–100.0)
MONO ABS: 0.7 10*3/uL (ref 0.1–1.0)
MONOS PCT: 8 % (ref 3–12)
Neutro Abs: 7.5 10*3/uL (ref 1.7–7.7)
Neutrophils Relative %: 78 % — ABNORMAL HIGH (ref 43–77)
Platelets: 304 10*3/uL (ref 150–400)
RBC: 4.04 MIL/uL — ABNORMAL LOW (ref 4.22–5.81)
RDW: 14.4 % (ref 11.5–15.5)
WBC: 9.6 10*3/uL (ref 4.0–10.5)

## 2014-04-21 LAB — BASIC METABOLIC PANEL
Anion gap: 12 (ref 5–15)
BUN: 10 mg/dL (ref 6–23)
CO2: 20 mmol/L (ref 19–32)
CREATININE: 0.9 mg/dL (ref 0.50–1.35)
Calcium: 9.2 mg/dL (ref 8.4–10.5)
Chloride: 103 mEq/L (ref 96–112)
GFR calc Af Amer: >90 mL/min (ref >90)
GFR calc non Af Amer: >90 mL/min (ref >90)
Glucose, Bld: 110 mg/dL — ABNORMAL HIGH (ref 70–99)
Potassium: 3.6 mmol/L (ref 3.5–5.1)
SODIUM: 135 mmol/L (ref 135–145)

## 2014-04-21 LAB — SYNOVIAL CELL COUNT + DIFF, W/ CRYSTALS
CRYSTALS FLUID: NONE SEEN
Lymphocytes-Synovial Fld: 3 % (ref 0–20)
Monocyte-Macrophage-Synovial Fluid: 1 % — ABNORMAL LOW (ref 50–90)
Neutrophil, Synovial: 96 % — ABNORMAL HIGH (ref 0–25)
WBC, Synovial: UNDETERMINED /mm3 (ref 0–200)

## 2014-04-21 LAB — C-REACTIVE PROTEIN: CRP: <0.5 mg/dL — ABNORMAL LOW (ref <0.60)

## 2014-04-21 LAB — SEDIMENTATION RATE: SED RATE: 14 mm/h (ref 0–16)

## 2014-04-21 MED ORDER — OXYCODONE-ACETAMINOPHEN 5-325 MG PO TABS: 2.0000 | ORAL_TABLET | ORAL | Status: DC | PRN

## 2014-04-21 MED ORDER — HYDROMORPHONE HCL 1 MG/ML IJ SOLN
1.0000 mg | Freq: Once | INTRAMUSCULAR | Status: AC
  Administered 2014-04-21: 1 mg via INTRAVENOUS
  Filled 2014-04-21: qty 1

## 2014-04-21 MED ORDER — CEPHALEXIN 500 MG PO CAPS: 500.0000 mg | ORAL_CAPSULE | Freq: Four times a day (QID) | ORAL | Status: DC

## 2014-04-21 MED ORDER — INDOMETHACIN 50 MG PO CAPS
50.0000 mg | ORAL_CAPSULE | Freq: Once | ORAL | Status: AC
  Administered 2014-04-21: 50 mg via ORAL
  Filled 2014-04-21: qty 1

## 2014-04-21 MED ORDER — ALBUTEROL SULFATE HFA 108 (90 BASE) MCG/ACT IN AERS: 1.0000 | INHALATION_SPRAY | Freq: Four times a day (QID) | RESPIRATORY_TRACT | Status: DC | PRN

## 2014-04-21 MED ORDER — CEPHALEXIN 500 MG PO CAPS
500.0000 mg | ORAL_CAPSULE | Freq: Once | ORAL | Status: AC
  Administered 2014-04-21: 500 mg via ORAL
  Filled 2014-04-21: qty 1

## 2014-04-21 MED ORDER — ALBUTEROL SULFATE HFA 108 (90 BASE) MCG/ACT IN AERS: 2.0000 | INHALATION_SPRAY | RESPIRATORY_TRACT | Status: DC

## 2014-04-21 MED ORDER — COLCHICINE 0.6 MG PO TABS
0.6000 mg | ORAL_TABLET | Freq: Once | ORAL | Status: AC
  Administered 2014-04-21: 0.6 mg via ORAL
  Filled 2014-04-21: qty 1

## 2014-04-21 MED ORDER — COLCHICINE 0.6 MG PO TABS: 0.6000 mg | ORAL_TABLET | Freq: Two times a day (BID) | ORAL | Status: DC

## 2014-04-21 MED ORDER — LIDOCAINE-EPINEPHRINE 2 %-1:100000 IJ SOLN
20.0000 mL | Freq: Once | INTRAMUSCULAR | Status: AC
  Administered 2014-04-21: 20 mL
  Filled 2014-04-21: qty 1

## 2014-04-21 MED ORDER — LEVOFLOXACIN 500 MG PO TABS: 500.0000 mg | ORAL_TABLET | Freq: Every day | ORAL | Status: DC

## 2014-04-21 MED ORDER — INDOMETHACIN 25 MG PO CAPS: 25.0000 mg | ORAL_CAPSULE | Freq: Three times a day (TID) | ORAL | Status: DC | PRN

## 2014-04-21 MED ORDER — PREDNISONE 20 MG PO TABS: ORAL_TABLET | ORAL | Status: DC

## 2014-04-21 MED ORDER — BENZONATATE 100 MG PO CAPS: 100.0000 mg | ORAL_CAPSULE | Freq: Three times a day (TID) | ORAL | Status: DC

## 2014-04-21 NOTE — ED Notes (Signed)
Bed: WA17 Expected date:  Expected time:  Means of arrival:  Comments: Gout, arm pain, fentanyl on board
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
## 2014-04-21 NOTE — ED Notes (Signed)
Patient transported to X-ray 

## 2014-04-21 NOTE — ED Notes (Signed)
Pt's contact----- Devin [name] (sister): tel# [phone]
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
## 2014-04-21 NOTE — ED Provider Notes (Signed)
Patient seen by Dr. Fredna Dow. He felt this is very likely acute gouty arthritis. He recommends discharge with antibiotics, indomethacin, colchicine, pain control, splint, and sling. He will see the patient his office tomorrow. I reviewed this in detail with the patient. He will call the office in the morning to arrange an afternoon office time. Explained in detail. Given written form. He is appropriate for discharge home.  Tanna Furry, MD 04/21/14 [phone]

## 2014-04-21 NOTE — Discharge Instructions (Signed)
Gout °Gout is when your joints become red, sore, and swell (inflamed). This is caused by the buildup of uric acid crystals in the joints. Uric acid is a chemical that is normally in the blood. If the level of uric acid gets too high in the blood, these crystals form in your joints and tissues. Over time, these crystals can form into masses near the joints and tissues. These masses can destroy bone and cause the bone to look misshapen (deformed). °HOME CARE  °· Do not take aspirin for pain. °· Only take medicine as told by your doctor. °· Rest the joint as much as you can. When in bed, keep sheets and blankets off painful areas. °· Keep the sore joints raised (elevated). °· Put warm or cold packs on painful joints. Use of warm or cold packs depends on which works best for you. °· Use crutches if the painful joint is in your leg. °· Drink enough fluids to keep your pee (urine) clear or pale yellow. Limit alcohol, sugary drinks, and drinks with fructose in them. °· Follow your diet instructions. Pay careful attention to how much protein you eat. Include fruits, vegetables, whole grains, and fat-free or low-fat milk products in your daily diet. Talk to your doctor or dietitian about the use of coffee, vitamin C, and cherries. These may help lower uric acid levels. °· Keep a healthy body weight. °GET HELP RIGHT AWAY IF:  °· You have watery poop (diarrhea), throw up (vomit), or have any side effects from medicines. °· You do not feel better in 24 hours, or you are getting worse. °· Your joint becomes suddenly more tender, and you have chills or a fever. °MAKE SURE YOU:  °· Understand these instructions. °· Will watch your condition. °· Will get help right away if you are not doing well or get worse. °Document Released: 01/25/2008 Document Revised: 09/01/2013 Document Reviewed: 11/29/2011 °ExitCare® Patient Information ©2015 [employer]. This information is not intended to replace advice given to you by your health care  provider. Make sure you discuss any questions you have with your health care provider. ° °

## 2014-04-25 LAB — BODY FLUID CULTURE: Culture: NO GROWTH

## 2014-04-28 ENCOUNTER — Encounter (HOSPITAL_COMMUNITY): Payer: Self-pay | Admitting: Emergency Medicine

## 2014-04-28 ENCOUNTER — Emergency Department (HOSPITAL_COMMUNITY)
Admission: EM | Admit: 2014-04-28 | Discharge: 2014-04-28 | Disposition: A | Discharge status: Home / Self Care | Payer: MEDICAID | Attending: Emergency Medicine | Admitting: Emergency Medicine

## 2014-04-28 DIAGNOSIS — Z7982 Long term (current) use of aspirin: Secondary | ICD-10-CM | POA: Insufficient documentation

## 2014-04-28 DIAGNOSIS — Z720 Tobacco use: Secondary | ICD-10-CM | POA: Insufficient documentation

## 2014-04-28 DIAGNOSIS — Z792 Long term (current) use of antibiotics: Secondary | ICD-10-CM | POA: Insufficient documentation

## 2014-04-28 DIAGNOSIS — M109 Gout, unspecified: Secondary | ICD-10-CM

## 2014-04-28 DIAGNOSIS — I252 Old myocardial infarction: Secondary | ICD-10-CM | POA: Insufficient documentation

## 2014-04-28 DIAGNOSIS — M10021 Idiopathic gout, right elbow: Secondary | ICD-10-CM | POA: Insufficient documentation

## 2014-04-28 DIAGNOSIS — E785 Hyperlipidemia, unspecified: Secondary | ICD-10-CM | POA: Insufficient documentation

## 2014-04-28 DIAGNOSIS — Z79899 Other long term (current) drug therapy: Secondary | ICD-10-CM | POA: Insufficient documentation

## 2014-04-28 DIAGNOSIS — I10 Essential (primary) hypertension: Secondary | ICD-10-CM | POA: Insufficient documentation

## 2014-04-28 LAB — BASIC METABOLIC PANEL
Anion gap: 11 (ref 5–15)
BUN: 9 mg/dL (ref 6–23)
CALCIUM: 9.1 mg/dL (ref 8.4–10.5)
CO2: 24 mmol/L (ref 19–32)
CREATININE: 1.05 mg/dL (ref 0.50–1.35)
Chloride: 94 mEq/L — ABNORMAL LOW (ref 96–112)
GFR calc Af Amer: >90 mL/min (ref >90)
GFR calc non Af Amer: 79 mL/min — ABNORMAL LOW (ref >90)
Glucose, Bld: 169 mg/dL — ABNORMAL HIGH (ref 70–99)
Potassium: 3.5 mmol/L (ref 3.5–5.1)
SODIUM: 129 mmol/L — AB (ref 135–145)

## 2014-04-28 LAB — CBC WITH DIFFERENTIAL/PLATELET
Basophils Absolute: 0 10*3/uL (ref 0.0–0.1)
Basophils Relative: 0 % (ref 0–1)
EOS PCT: 0 % (ref 0–5)
Eosinophils Absolute: 0 10*3/uL (ref 0.0–0.7)
HCT: 39.4 % (ref 39.0–52.0)
Hemoglobin: 13.4 g/dL (ref 13.0–17.0)
LYMPHS ABS: 0.7 10*3/uL (ref 0.7–4.0)
Lymphocytes Relative: 10 % — ABNORMAL LOW (ref 12–46)
MCH: 32.6 pg (ref 26.0–34.0)
MCHC: 34 g/dL (ref 30.0–36.0)
MCV: 95.9 fL (ref 78.0–100.0)
Monocytes Absolute: 0.9 10*3/uL (ref 0.1–1.0)
Monocytes Relative: 13 % — ABNORMAL HIGH (ref 3–12)
NEUTROS PCT: 77 % (ref 43–77)
Neutro Abs: 5.5 10*3/uL (ref 1.7–7.7)
PLATELETS: 293 10*3/uL (ref 150–400)
RBC: 4.11 MIL/uL — ABNORMAL LOW (ref 4.22–5.81)
RDW: 14.3 % (ref 11.5–15.5)
WBC: 7.2 10*3/uL (ref 4.0–10.5)

## 2014-04-28 MED ORDER — IBUPROFEN 800 MG PO TABS: 800.0000 mg | ORAL_TABLET | Freq: Three times a day (TID) | ORAL | Status: DC

## 2014-04-28 MED ORDER — INDOMETHACIN 25 MG PO CAPS: 50.0000 mg | ORAL_CAPSULE | Freq: Three times a day (TID) | ORAL | Status: DC | PRN

## 2014-04-28 MED ORDER — OXYCODONE-ACETAMINOPHEN 5-325 MG PO TABS: 1.0000 | ORAL_TABLET | Freq: Four times a day (QID) | ORAL | Status: DC | PRN

## 2014-04-28 MED ORDER — KETOROLAC TROMETHAMINE 30 MG/ML IJ SOLN
30.0000 mg | Freq: Once | INTRAMUSCULAR | Status: AC
  Administered 2014-04-28: 30 mg via INTRAMUSCULAR
  Filled 2014-04-28: qty 1

## 2014-04-28 MED ORDER — OXYCODONE-ACETAMINOPHEN 5-325 MG PO TABS
2.0000 | ORAL_TABLET | Freq: Once | ORAL | Status: AC
  Administered 2014-04-28: 2 via ORAL
  Filled 2014-04-28: qty 2

## 2014-04-28 MED ORDER — COLCHICINE 0.6 MG PO TABS: 0.6000 mg | ORAL_TABLET | Freq: Every day | ORAL | Status: DC

## 2014-04-28 NOTE — ED Notes (Signed)
Per EMS: pt c/o right elbow pain, denies injury was here 1 week ago told it was possibly gout.

## 2014-04-28 NOTE — ED Notes (Signed)
Bed: WTR9 Expected date:  Expected time:  Means of arrival:  Comments: EMS- 53yo M, elbow pain

## 2014-04-28 NOTE — Progress Notes (Signed)
  CARE MANAGEMENT ED NOTE 04/28/2014  Patient:  Devin [name], Devin [name]   Account Number:  [id number]  Date Initiated:  04/28/2014  Documentation initiated by:  Jackelyn Poling  Subjective/Objective Assessment:   53 yr old self pay [place] c/o elbow dx with Gout Prescribed colcochine, indocin and oxycodone.     Subjective/Objective Assessment Detail:   pcp [place], [place]  Cm noted via [facility] indocin "Indocin has been discontinued by the manufacturer and is no longer available"  Discussed need to take at least $10 to pharmacy along with Prescriptions and [place] letter Voiced understanding that the oxycodone is not covered by [facility] letter  Voiced he would f/u with [facility] staff     Action/Plan:   ED CM consulted by ED Triage RN to see if pt could be assisted with medicines after being seen by [facility] staff Stacy CM encouraged pt to f/u with [facility] staff on discounted pcps, pharmacies and specialists   Action/Plan Detail:   Anticipated DC Date:  04/28/2014     Status Recommendation to Physician:   Result of Recommendation:    Other ED Services  Consult Working [place]  Other  Outpatient Services - Pt will follow up  PCP issues  [facility] / [facility] (established/new)  Medication Assistance  MATCH Program    Choice offered to / List presented to:            Status of service:  Completed, signed off  ED Comments:   ED Comments Detail:  CM reviewed EPIC notes and chart review information CM spoke with the pt about [facility] MATCH program ($3 co pay for each Rx through [facility] program, does not include refills, 7 day expiration of [place] letter and choice of pharmacies) Pt agreed to receive assistance from program  Pt is eligible for [facility] MATCH program (unable to find pt listed in [place] per cardholder name inquiry)  PDMI information entered. [place] letter completed and provided to pt.  CM ED RN & pt updated on Indocin discontinued Pt encouraged to take prescriptions with

## 2014-04-28 NOTE — ED Notes (Signed)
Pt seen by case manager Onalee Hua and given instructions on medications and how to get them.

## 2014-04-28 NOTE — ED Notes (Signed)
Bus pass given 

## 2014-04-28 NOTE — ED Notes (Signed)
Pt did not take RX for Ibuprofren. Will try to get Indomethacin filled.

## 2014-04-28 NOTE — Progress Notes (Signed)
Dear ________________ Osvaldo Angst h _________:  Dennis Bast have been approved to have the prescriptions written by your discharging physician filled through our [facility] (Medication Assistance Through [facility]) program. This program allows for a one-time (no refills) 34-day supply of selected medications for a low copay amount.  The copay is $3.00 per prescription. For instance, if you have one prescription, you will pay $3.00; for two prescriptions, you pay $6.00; for three prescriptions, you pay $9.00; and so on.  Only certain pharmacies are participating in this program with [facility]. You will need to select one of the pharmacies from the attached list and take your prescriptions, this letter, and your photo ID to one of the participating pharmacies.   We are excited that you are able to use the [facility] program to get your medications. These prescriptions must be filled within 7 days of hospital discharge or they will no longer be valid for the [facility] program. Should you have any problems with your prescriptions please contact your case management team member at [phone].  Thank you,   [place], [address], [place], [place], [place], [place], [place], [place], [place] [employer], [place], [employer], [place], [address] [employer], [place], [place], [place], [place], [place], [place], [place], [place], [place]   [facility] [address] [place], [place], [place], [place] [address] [place], [place], [place] [address], [place], [place], [place], [place], [place], [place]  [address] [place], [place] [address] [place], [place], [place], [place], [place], [place] [address], [place], [place]   [place] [address] [place], [place] [employer] [address], [place], [place], [place], [place], [place], [place], [place]                                  [address], [place], [place], [place] [address] [place], [place], [place], [place], [place], [place]  [place], [place], [place], [place] [address] [place] [address], [place], [place], [place], [place] [employer], [place], [address] [place], [place] [address], [place], [place]                                       [place], [place], [place] [employer], [place], [place]                                           [place], [place], [place], [place], [place], [place], [place], [place], [place], [place], [place], [place], [place]
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
## 2014-04-29 NOTE — ED Provider Notes (Signed)
Medical screening examination/treatment/procedure(s) were conducted as a shared visit with non-physician practitioner(s) and myself.  I personally evaluated the patient during the encounter.   EKG Interpretation None      53 yo male with hx of gout presenting with pain in right elbow and right wrist.  He was seen in the ED a week ago for similar symptoms at which time symptoms were suspected to be from gout, less likely infection.  He was supposed to take colchicine, NSAIDs, and abx, and to follow up with Ortho. He reports being unable to do these things for financial reasons.  Today, swelling has persisted.  On exam, well appearing, nontoxic, not distressed, normal respiratory effort, normal perfusion, EtOH on breath, right elbow swollen and tender, without erythema or warm, decreased ROM due to pain, right wrist mildly swollen and TTP without erythema or warmth.  Suspect flare up of arthritis.  Very low likelihood of infection given time course, exam, and history.  Plan dc home.  Clinical Impression: 1. Acute gout of right elbow, unspecified cause       Houston Siren III, MD 04/29/14 [phone]

## 2014-05-21 ENCOUNTER — Emergency Department (HOSPITAL_COMMUNITY)
Admission: EM | Admit: 2014-05-21 | Discharge: 2014-05-21 | Disposition: A | Discharge status: Home / Self Care | Payer: MEDICAID | Attending: Emergency Medicine | Admitting: Emergency Medicine

## 2014-05-21 ENCOUNTER — Encounter (HOSPITAL_COMMUNITY): Payer: Self-pay | Admitting: Emergency Medicine

## 2014-05-21 DIAGNOSIS — Z9889 Other specified postprocedural states: Secondary | ICD-10-CM | POA: Insufficient documentation

## 2014-05-21 DIAGNOSIS — I252 Old myocardial infarction: Secondary | ICD-10-CM | POA: Insufficient documentation

## 2014-05-21 DIAGNOSIS — Z791 Long term (current) use of non-steroidal anti-inflammatories (NSAID): Secondary | ICD-10-CM | POA: Insufficient documentation

## 2014-05-21 DIAGNOSIS — I10 Essential (primary) hypertension: Secondary | ICD-10-CM | POA: Insufficient documentation

## 2014-05-21 DIAGNOSIS — Z79899 Other long term (current) drug therapy: Secondary | ICD-10-CM | POA: Insufficient documentation

## 2014-05-21 DIAGNOSIS — R0789 Other chest pain: Secondary | ICD-10-CM | POA: Insufficient documentation

## 2014-05-21 DIAGNOSIS — E785 Hyperlipidemia, unspecified: Secondary | ICD-10-CM | POA: Insufficient documentation

## 2014-05-21 DIAGNOSIS — M109 Gout, unspecified: Secondary | ICD-10-CM | POA: Insufficient documentation

## 2014-05-21 DIAGNOSIS — Z720 Tobacco use: Secondary | ICD-10-CM | POA: Insufficient documentation

## 2014-05-21 DIAGNOSIS — Z792 Long term (current) use of antibiotics: Secondary | ICD-10-CM | POA: Insufficient documentation

## 2014-05-21 HISTORY — DX: Other chronic pain: G89.29

## 2014-05-21 HISTORY — DX: Dorsalgia, unspecified: M54.9

## 2014-05-21 LAB — BASIC METABOLIC PANEL WITH GFR
Anion gap: 12 (ref 5–15)
BUN: <5 mg/dL — ABNORMAL LOW (ref 6–23)
CO2: 22 mmol/L (ref 19–32)
Calcium: 9.6 mg/dL (ref 8.4–10.5)
Chloride: 102 meq/L (ref 96–112)
Creatinine, Ser: 0.93 mg/dL (ref 0.50–1.35)
GFR calc Af Amer: >90 mL/min
GFR calc non Af Amer: >90 mL/min
Glucose, Bld: 106 mg/dL — ABNORMAL HIGH (ref 70–99)
Potassium: 4.1 mmol/L (ref 3.5–5.1)
Sodium: 136 mmol/L (ref 135–145)

## 2014-05-21 LAB — CBC WITH DIFFERENTIAL/PLATELET
Basophils Absolute: 0 10*3/uL (ref 0.0–0.1)
Basophils Relative: 0 % (ref 0–1)
EOS ABS: 0.1 10*3/uL (ref 0.0–0.7)
EOS PCT: 2 % (ref 0–5)
HCT: 40.1 % (ref 39.0–52.0)
HEMOGLOBIN: 13.5 g/dL (ref 13.0–17.0)
Lymphocytes Relative: 16 % (ref 12–46)
Lymphs Abs: 1.1 10*3/uL (ref 0.7–4.0)
MCH: 32.5 pg (ref 26.0–34.0)
MCHC: 33.7 g/dL (ref 30.0–36.0)
MCV: 96.6 fL (ref 78.0–100.0)
MONOS PCT: 10 % (ref 3–12)
Monocytes Absolute: 0.7 10*3/uL (ref 0.1–1.0)
Neutro Abs: 4.9 10*3/uL (ref 1.7–7.7)
Neutrophils Relative %: 72 % (ref 43–77)
Platelets: 240 10*3/uL (ref 150–400)
RBC: 4.15 MIL/uL — ABNORMAL LOW (ref 4.22–5.81)
RDW: 14.6 % (ref 11.5–15.5)
WBC: 6.8 10*3/uL (ref 4.0–10.5)

## 2014-05-21 LAB — I-STAT TROPONIN, ED: Troponin i, poc: 0 ng/mL (ref 0.00–0.08)

## 2014-05-21 MED ORDER — KETOROLAC TROMETHAMINE 30 MG/ML IJ SOLN
30.0000 mg | Freq: Once | INTRAMUSCULAR | Status: AC
  Administered 2014-05-21: 30 mg via INTRAVENOUS
  Filled 2014-05-21: qty 1

## 2014-05-21 MED ORDER — COLCHICINE 0.6 MG PO TABS: ORAL_TABLET | ORAL | Status: DC

## 2014-05-21 MED ORDER — NAPROXEN 500 MG PO TABS: 500.0000 mg | ORAL_TABLET | Freq: Two times a day (BID) | ORAL | Status: DC | PRN

## 2014-05-21 MED ORDER — MORPHINE SULFATE 4 MG/ML IJ SOLN
4.0000 mg | Freq: Once | INTRAMUSCULAR | Status: AC
  Administered 2014-05-21: 4 mg via INTRAVENOUS
  Filled 2014-05-21: qty 1

## 2014-05-21 MED ORDER — OXYCODONE-ACETAMINOPHEN 5-325 MG PO TABS: 1.0000 | ORAL_TABLET | Freq: Four times a day (QID) | ORAL | Status: DC | PRN

## 2014-05-21 NOTE — ED Notes (Signed)
Rt. Elbow: gout flair up; pt. Out of pain meds.; After arriving here in the ED, c/o rt. Chest pain and the pain is radiating down rt. Arm. Hx. Of htn. Of mi.

## 2014-05-21 NOTE — Discharge Instructions (Signed)
Use colchicine as directed. Use naprosyn and percocet as directed as needed for pain. See your regular doctor for ongoing care of your gout attack. See the list of foods below to avoid in order to avoid any further attacks. Take your usual home medications including your blood pressure medicine. Return to the ER for changes or worsening symptoms.   Gout Gout is when your joints become red, sore, and swell (inflamed). This is caused by the buildup of uric acid crystals in the joints. Uric acid is a chemical that is normally in the blood. If the level of uric acid gets too high in the blood, these crystals form in your joints and tissues. Over time, these crystals can form into masses near the joints and tissues. These masses can destroy bone and cause the bone to look misshapen (deformed). HOME CARE   Do not take aspirin for pain.  Only take medicine as told by your doctor.  Rest the joint as much as you can. When in bed, keep sheets and blankets off painful areas.  Keep the sore joints raised (elevated).  Put warm or cold packs on painful joints. Use of warm or cold packs depends on which works best for you.  Use crutches if the painful joint is in your leg.  Drink enough fluids to keep your pee (urine) clear or pale yellow. Limit alcohol, sugary drinks, and drinks with fructose in them.  Follow your diet instructions. Pay careful attention to how much protein you eat. Include fruits, vegetables, whole grains, and fat-free or low-fat milk products in your daily diet. Talk to your doctor or dietitian about the use of coffee, vitamin C, and cherries. These may help lower uric acid levels.  Keep a healthy body weight. GET HELP RIGHT AWAY IF:   You have watery poop (diarrhea), throw up (vomit), or have any side effects from medicines.  You do not feel better in 24 hours, or you are getting worse.  Your joint becomes suddenly more tender, and you have chills or a fever. MAKE SURE YOU:    Understand these instructions.  Will watch your condition.  Will get help right away if you are not doing well or get worse. Document Released: 01/25/2008 Document Revised: 09/01/2013 Document Reviewed: 11/29/2011 [facility] Patient Information 2015 [place], [place]. This information is not intended to replace advice given to you by your health care provider. Make sure you discuss any questions you have with your health care provider.  Hypertension Hypertension is another name for high blood pressure. High blood pressure forces your heart to work harder to pump blood. A blood pressure reading has two numbers, which includes a higher number over a lower number (example: 110/72). HOME CARE   Have your blood pressure rechecked by your doctor.  Only take medicine as told by your doctor. Follow the directions carefully. The medicine does not work as well if you skip doses. Skipping doses also puts you at risk for problems.  Do not smoke.  Monitor your blood pressure at home as told by your doctor. GET HELP IF:  You think you are having a reaction to the medicine you are taking.  You have repeat headaches or feel dizzy.  You have puffiness (swelling) in your ankles.  You have trouble with your vision. GET HELP RIGHT AWAY IF:   You get a very bad headache and are confused.  You feel weak, numb, or faint.  You get chest or belly (abdominal) pain.  You throw up (  vomit).  You cannot breathe very well. MAKE SURE YOU:   Understand these instructions.  Will watch your condition.  Will get help right away if you are not doing well or get worse. Document Released: 10/04/2007 Document Revised: 04/22/2013 Document Reviewed: 02/07/2013 [facility] Patient Information 2015 [place], [place]. This information is not intended to replace advice given to you by your health care provider. Make sure you discuss any questions you have with your health care provider.  Chest Pain (Nonspecific) It  is often hard to give a diagnosis for the cause of chest pain. There is always a chance that your pain could be related to something serious, such as a heart attack or a blood clot in the lungs. You need to follow up with your doctor. HOME CARE  If antibiotic medicine was given, take it as directed by your doctor. Finish the medicine even if you start to feel better.  For the next few days, avoid activities that bring on chest pain. Continue physical activities as told by your doctor.  Do not use any tobacco products. This includes cigarettes, chewing tobacco, and e-cigarettes.  Avoid drinking alcohol.  Only take medicine as told by your doctor.  Follow your doctor's suggestions for more testing if your chest pain does not go away.  Keep all doctor visits you made. GET HELP IF:  Your chest pain does not go away, even after treatment.  You have a rash with blisters on your chest.  You have a fever. GET HELP RIGHT AWAY IF:   You have more pain or pain that spreads to your arm, neck, jaw, back, or belly (abdomen).  You have shortness of breath.  You cough more than usual or cough up blood.  You have very bad back or belly pain.  You feel sick to your stomach (nauseous) or throw up (vomit).  You have very bad weakness.  You pass out (faint).  You have chills. This is an emergency. Do not wait to see if the problems will go away. Call your local emergency services (911 in [place] Do not drive yourself to the hospital. MAKE SURE YOU:   Understand these instructions.  Will watch your condition.  Will get help right away if you are not doing well or get worse. Document Released: 10/04/2007 Document Revised: 04/22/2013 Document Reviewed: 10/04/2007 [facility] Patient Information 2015 [place], [place]. This information is not intended to replace advice given to you by your health care provider. Make sure you discuss any questions you have with your health care  provider.  Low-Purine Diet Purines are compounds that affect the level of uric acid in your body. A low-purine diet is a diet that is low in purines. Eating a low-purine diet can prevent the level of uric acid in your body from getting too high and causing gout or kidney stones or both. WHAT DO I NEED TO KNOW ABOUT THIS DIET?  Choose low-purine foods. Examples of low-purine foods are listed in the next section.  Drink plenty of fluids, especially water. Fluids can help remove uric acid from your body. Try to drink 8-16 cups (1.9-3.8 L) a day.  Limit foods high in fat, especially saturated fat, as fat makes it harder for the body to get rid of uric acid. Foods high in saturated fat include pizza, cheese, ice cream, whole milk, fried foods, and gravies. Choose foods that are lower in fat and lean sources of protein. Use olive oil when cooking as it contains healthy fats that are  not high in saturated fat.  Limit alcohol. Alcohol interferes with the elimination of uric acid from your body. If you are having a gout attack, avoid all alcohol.  Keep in mind that different people's bodies react differently to different foods. You will probably learn over time which foods do or do not affect you. If you discover that a food tends to cause your gout to flare up, avoid eating that food. You can more freely enjoy foods that do not cause problems. If you have any questions about a food item, talk to your dietitian or health care provider. WHICH FOODS ARE LOW, MODERATE, AND HIGH IN PURINES? The following is a list of foods that are low, moderate, and high in purines. You can eat any amount of the foods that are low in purines. You may be able to have small amounts of foods that are moderate in purines. Ask your health care provider how much of a food moderate in purines you can have. Avoid foods high in purines. Grains  Foods low in purines: Enriched white bread, pasta, rice, cake, cornbread, popcorn.  Foods  moderate in purines: Whole-grain breads and cereals, wheat germ, bran, oatmeal. Uncooked oatmeal. Dry wheat bran or wheat germ.  Foods high in purines: Pancakes, [place] toast, biscuits, muffins. Vegetables  Foods low in purines: All vegetables, except those that are moderate in purines.  Foods moderate in purines: Asparagus, cauliflower, spinach, mushrooms, green peas. Fruits  All fruits are low in purines. Meats and other Protein Foods  Foods low in purines: Eggs, nuts, peanut butter.  Foods moderate in purines: 80-90% lean beef, lamb, veal, pork, poultry, fish, eggs, peanut butter, nuts. Crab, lobster, oysters, and shrimp. Cooked dried beans, peas, and lentils.  Foods high in purines: Anchovies, sardines, herring, mussels, tuna, codfish, scallops, trout, and haddock. Berniece Salines. Organ meats (such as liver or kidney). Tripe. Game meat. Goose. Sweetbreads. Dairy  All dairy foods are low in purines. Low-fat and fat-free dairy products are best because they are low in saturated fat. Beverages  Drinks low in purines: Water, carbonated beverages, tea, coffee, cocoa.  Drinks moderate in purines: Soft drinks and other drinks sweetened with high-fructose corn syrup. Juices. To find whether a food or drink is sweetened with high-fructose corn syrup, look at the ingredients list.  Drinks high in purines: Alcoholic beverages (such as beer). Condiments  Foods low in purines: Salt, herbs, olives, pickles, relishes, vinegar.  Foods moderate in purines: Butter, margarine, oils, mayonnaise. Fats and Oils  Foods low in purines: All types, except gravies and sauces made with meat.  Foods high in purines: Gravies and sauces made with meat. Other Foods  Foods low in purines: Sugars, sweets, gelatin. Cake. Soups made without meat.  Foods moderate in purines: Meat-based or fish-based soups, broths, or bouillons. Foods and drinks sweetened with high-fructose corn syrup.  Foods high in purines:  High-fat desserts (such as ice cream, cookies, cakes, pies, doughnuts, and chocolate). Contact your dietitian for more information on foods that are not listed here. Document Released: 08/12/2010 Document Revised: 04/22/2013 Document Reviewed: 03/24/2013 [facility] Patient Information 2015 [place], [place]. This information is not intended to replace advice given to you by your health care provider. Make sure you discuss any questions you have with your health care provider.

## 2014-05-21 NOTE — Progress Notes (Signed)
[facility]  Appointment made with Family Medicine at [facility] to establish care and obtain the orange card for Tuesday Feb 2 @ 9:30am. Haig Prophet card application and appointment time given, pt verbalized understanding. Patient was referred to the Health Dept at [place], to obtain discharge medications today. Patient states he has received medications from the Health dept before and is familiar with the process. My contact information provided for any future questions or concerns, no other needs identified at this time.
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
## 2014-05-21 NOTE — Discharge Planning (Signed)
Consulted [facility] of [facility] due to pt being MATCHed within last 30 days.

## 2014-07-06 ENCOUNTER — Encounter: Payer: Self-pay | Admitting: Licensed Clinical Social Worker

## 2014-07-06 ENCOUNTER — Telehealth: Payer: Self-pay | Admitting: *Deleted

## 2014-07-06 NOTE — Progress Notes (Signed)
Mr. Usman presents today as a walk-in to obtain refills of his medications.  Pt has not been seen in [facility] for several months and will need to be seen by a provider prior to refills being given.  Pt has scheduled an appointment for tomorrow for his PCP.  Mr. Vaness requesting a bus pass to return home today.  CSW met with Mr. Grand.  Pt states he is current with his [employer] card, but pt had switched to [facility].  Mr. Gowans states he wants to return to [facility] for his care.  CSW made copy of pt's [employer] [place] card.  Pt encouraged to speak with Financial Counselor regarding switching from [facility] to [facility].  CSW informed Mr. Peden, [facility] unable to provide bus pass when he was not seen by a physician today.  However, with his current [employer] [facility] card, Mr. Gangwer can apply for the [facility] transportation.  Pt completed application/signed.  Since pt is scheduled for an appointment within 3 days, CSW utilized [facility] bus pass.  Pt aware he will only obtain 2 [employer] bus pass, 1 provided today.  Mr. Bissette provided with the phone number to contact [employer] to schedule transportation ahead of time. 

## 2014-07-06 NOTE — Telephone Encounter (Signed)
Pt presents today and wants refills, has not been seen for slightly over 1 year, appt is made for tomorrow 3/8 w/ dr Ronnald Ramp, pt states he has not taken meds in at least 3 weeks, wants refills today, he is ask to wait until appt and dr Ronnald Ramp will refill then

## 2014-07-06 NOTE — Telephone Encounter (Signed)
I think its wise to wait for a full exam and reassessment before refilling. Since the visit is tommorrow, I agree that he should get his refills through his PCP.

## 2014-07-07 ENCOUNTER — Encounter: Payer: Self-pay | Admitting: Internal Medicine

## 2014-07-07 ENCOUNTER — Ambulatory Visit (INDEPENDENT_AMBULATORY_CARE_PROVIDER_SITE_OTHER): Payer: Self-pay | Admitting: Internal Medicine

## 2014-07-07 VITALS — BP 133/88 | HR 94 | Temp 98.0°F | Ht 70.0 in | Wt 157.3 lb

## 2014-07-07 DIAGNOSIS — E785 Hyperlipidemia, unspecified: Secondary | ICD-10-CM

## 2014-07-07 DIAGNOSIS — M1A0210 Idiopathic chronic gout, right elbow, without tophus (tophi): Secondary | ICD-10-CM | POA: Insufficient documentation

## 2014-07-07 DIAGNOSIS — Z9119 Patient's noncompliance with other medical treatment and regimen: Secondary | ICD-10-CM

## 2014-07-07 DIAGNOSIS — Z23 Encounter for immunization: Secondary | ICD-10-CM

## 2014-07-07 DIAGNOSIS — I10 Essential (primary) hypertension: Secondary | ICD-10-CM

## 2014-07-07 DIAGNOSIS — Z9114 Patient's other noncompliance with medication regimen: Secondary | ICD-10-CM

## 2014-07-07 DIAGNOSIS — Z5900 Homelessness unspecified: Secondary | ICD-10-CM

## 2014-07-07 DIAGNOSIS — Z0000 Encounter for general adult medical examination without abnormal findings: Secondary | ICD-10-CM

## 2014-07-07 LAB — BASIC METABOLIC PANEL WITH GFR
BUN: 14 mg/dL (ref 6–23)
CO2: 26 meq/L (ref 19–32)
CREATININE: 0.92 mg/dL (ref 0.50–1.35)
Calcium: 9.2 mg/dL (ref 8.4–10.5)
Chloride: 100 mEq/L (ref 96–112)
GFR, Est African American: >89 mL/min
GFR, Est Non African American: >89 mL/min
Glucose, Bld: 85 mg/dL (ref 70–99)
Potassium: 4.6 mEq/L (ref 3.5–5.3)
Sodium: 133 mEq/L — ABNORMAL LOW (ref 135–145)

## 2014-07-07 LAB — URIC ACID: URIC ACID, SERUM: 10.6 mg/dL — AB (ref 4.0–7.8)

## 2014-07-07 MED ORDER — NAPROXEN 500 MG PO TABS: 500.0000 mg | ORAL_TABLET | Freq: Two times a day (BID) | ORAL | Status: DC | PRN

## 2014-07-07 MED ORDER — ATENOLOL 50 MG PO TABS: 50.0000 mg | ORAL_TABLET | Freq: Every day | ORAL | Status: DC

## 2014-07-07 MED ORDER — PRAVASTATIN SODIUM 40 MG PO TABS: 40.0000 mg | ORAL_TABLET | Freq: Every day | ORAL | Status: DC

## 2014-07-07 NOTE — Patient Instructions (Addendum)
General Instructions:  1. Please schedule a follow up appointment for 4 weeks.   2. Please take ONLY the following medications:  Atenolol 50 mg once daily (for blood pressure)  Aspirin 81 mg once daily  Pravachol 40 mg once daily.   Naprosyn 500 mg twice daily ONLY AS NEEDED for back pain or elbow pain   3. If you have worsening of your symptoms or new symptoms arise, please call the clinic [phone]), or go to the ER immediately if symptoms are severe.   Please try to bring all your medicines next time. This will help [place] keep you safe from mistakes.   Progress Toward Treatment Goals:  Treatment Goal 07/07/2014  Blood pressure at goal  Stop smoking smoking the same amount    Self Care Goals & Plans:  Self Care Goal 07/07/2014  Manage my medications take my medicines as prescribed; bring my medications to every visit  Eat healthy foods drink diet soda or water instead of juice or soda; eat more vegetables; eat foods that are low in salt; eat baked foods instead of fried foods; eat fruit for snacks and desserts  Meeting treatment goals maintain the current self-care plan    Care Management & Community Referrals:  Referral 07/07/2014  Referrals made for care management support none needed  Referrals made to community resources none
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
## 2014-07-08 NOTE — Progress Notes (Signed)
Internal Medicine Clinic Attending  Case discussed with Dr. Jones at the time of the visit.  We reviewed the resident's history and exam and pertinent patient test results.  I agree with the assessment, diagnosis, and plan of care documented in the resident's note.  

## 2014-07-09 NOTE — Assessment & Plan Note (Signed)
Flu shot given

## 2014-07-09 NOTE — Assessment & Plan Note (Signed)
Patient w/ recent gout flare involving right elbow and right wrist. Was seen in the ED 05/2014 for this. Sent home w/ NSAIDS and colchicine. Had difficulty obtaining colchicine but states he was able to get 1 month, now is no longer taking this. Only mild elbow pain on exam. No limitations w/ respect to ROM.  -Check Uric acid --> elevated at 10.6.  -Pain control w/ Naprosyn 500 mg bid prn -RTC in 1 month -Consider starting Allopurinol at that time for gout flare management.

## 2014-07-09 NOTE — Assessment & Plan Note (Signed)
Refilled Pravachol.

## 2014-07-09 NOTE — Assessment & Plan Note (Signed)
Patient homeless, does not wish to go to shelter. States he is living in an [place] house [place] the [place], was previously staying with family in [place] but does not wish to be a burden.  -Has discussed w/ CSW. Will help w/ transport

## 2014-07-09 NOTE — Assessment & Plan Note (Signed)
Patient has not taken medications for at least 1 month. Attempted to keep simple for him:  -Atenolol 50 mg daily -Pravachol 40 mg daily -ASA 81 mg -Naprosyn prn for pain  Have discontinued Amlodipine, HCTZ (increased risk of gout flare), and Colchicine. May consider adding Allopurinol at next visit.

## 2014-07-09 NOTE — Assessment & Plan Note (Signed)
BP Readings from Last 3 Encounters:  07/07/14 133/88  05/21/14 157/99  04/28/14 134/87    Lab Results  Component Value Date   NA 133* 07/07/2014   K 4.6 07/07/2014   CREATININE 0.92 07/07/2014    Assessment: Blood pressure control: controlled Progress toward BP goal:  at goal Comments: BP reasonable given the patient has not taken any of his medications for over 1 month.   Plan: Medications: Discontinue HCTZ, Norvasc. Continue Atenolol 50 mg daily.  Educational resources provided: brochure (denies) Self management tools provided:   Other plans: BMP reviewed, Cr stable.  -RTC in 1 month for further assessment of BP

## 2014-08-11 ENCOUNTER — Ambulatory Visit (INDEPENDENT_AMBULATORY_CARE_PROVIDER_SITE_OTHER): Payer: Self-pay | Admitting: Internal Medicine

## 2014-08-11 VITALS — BP 144/97 | HR 94 | Temp 97.9°F | Wt 156.6 lb

## 2014-08-11 DIAGNOSIS — M1A0210 Idiopathic chronic gout, right elbow, without tophus (tophi): Secondary | ICD-10-CM

## 2014-08-11 DIAGNOSIS — I10 Essential (primary) hypertension: Secondary | ICD-10-CM

## 2014-08-11 MED ORDER — ALLOPURINOL 300 MG PO TABS: 300.0000 mg | ORAL_TABLET | Freq: Every day | ORAL | Status: DC

## 2014-08-11 MED ORDER — ATENOLOL 100 MG PO TABS: 100.0000 mg | ORAL_TABLET | Freq: Every day | ORAL | Status: DC

## 2014-08-11 NOTE — Assessment & Plan Note (Signed)
BP Readings from Last 3 Encounters:  08/11/14 144/97  07/07/14 133/88  05/21/14 157/99    Lab Results  Component Value Date   NA 133* 07/07/2014   K 4.6 07/07/2014   CREATININE 0.92 07/07/2014    Assessment: Blood pressure control: mildly elevated Progress toward BP goal:  unchanged Comments: Mild elevation w/ intermittent tachycardia.   Plan: Medications:  Increase Atenolol to 100 mg DAILY. If patient admits to other symptoms in addition to his palpitations, should pursue further workup including EKG, repeat ECHO, etc.  Other plans: RTC in 6-8 weeks.

## 2014-08-11 NOTE — Patient Instructions (Signed)
General Instructions:  1. Please make a follow up appointment for 6-8 weeks.   2. Please take all medications as previously prescribed with the following changes:  INCREASE ATENOLOL to 100 mg daily.   Start taking Allopurinol 300 mg daily.   TAKE Aspirin 81 mg daily INSTEAD of 325 mg.   3. If you have worsening of your symptoms or new symptoms arise, please call the clinic ([phone]), or go to the ER immediately if symptoms are severe.  You have done a great job in taking all your medications. Please continue to do this.  Thank you for bringing your medicines today. This helps [place] keep you safe from mistakes.   Progress Toward Treatment Goals:  Treatment Goal 08/11/2014  Blood pressure unchanged  Stop smoking smoking less    Self Care Goals & Plans:  Self Care Goal 08/11/2014  Manage my medications take my medicines as prescribed; bring my medications to every visit; refill my medications on time  Eat healthy foods -  Meeting treatment goals maintain the current self-care plan    No flowsheet data found.   Care Management & Community Referrals:  Referral 08/11/2014  Referrals made for care management support -  Referrals made to community resources none

## 2014-08-11 NOTE — Assessment & Plan Note (Signed)
Patient w/ mild intermittent left elbow pain. Not having acute flare or pain at this time. Uric acid 10.6 at last clinic visit.  -Start Allopurinol 300 mg daily.  -Recheck uric acid at next visit if patient is able to take medication appropriately.

## 2014-08-11 NOTE — Progress Notes (Signed)
Internal Medicine Clinic Attending  Case discussed with Dr. Jones at the time of the visit.  We reviewed the resident's history and exam and pertinent patient test results.  I agree with the assessment, diagnosis, and plan of care documented in the resident's note.  
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
## 2014-08-13 ENCOUNTER — Ambulatory Visit: Payer: Self-pay

## 2014-09-29 ENCOUNTER — Encounter: Payer: Self-pay | Admitting: Licensed Clinical Social Worker

## 2014-09-29 ENCOUNTER — Ambulatory Visit (INDEPENDENT_AMBULATORY_CARE_PROVIDER_SITE_OTHER): Payer: Self-pay | Admitting: Internal Medicine

## 2014-09-29 ENCOUNTER — Encounter: Payer: Self-pay | Admitting: Internal Medicine

## 2014-09-29 VITALS — BP 139/92 | HR 71 | Temp 97.6°F | Ht 70.0 in | Wt 158.7 lb

## 2014-09-29 DIAGNOSIS — F329 Major depressive disorder, single episode, unspecified: Secondary | ICD-10-CM

## 2014-09-29 DIAGNOSIS — E785 Hyperlipidemia, unspecified: Secondary | ICD-10-CM

## 2014-09-29 DIAGNOSIS — Z590 Homelessness: Secondary | ICD-10-CM

## 2014-09-29 DIAGNOSIS — I10 Essential (primary) hypertension: Secondary | ICD-10-CM

## 2014-09-29 DIAGNOSIS — F1210 Cannabis abuse, uncomplicated: Secondary | ICD-10-CM

## 2014-09-29 DIAGNOSIS — M1A0210 Idiopathic chronic gout, right elbow, without tophus (tophi): Secondary | ICD-10-CM

## 2014-09-29 DIAGNOSIS — F32A Depression, unspecified: Secondary | ICD-10-CM | POA: Insufficient documentation

## 2014-09-29 MED ORDER — PRAVASTATIN SODIUM 40 MG PO TABS: 40.0000 mg | ORAL_TABLET | Freq: Every day | ORAL | Status: DC

## 2014-09-29 MED ORDER — ALLOPURINOL 300 MG PO TABS: 300.0000 mg | ORAL_TABLET | Freq: Every day | ORAL | Status: DC

## 2014-09-29 MED ORDER — ATENOLOL 100 MG PO TABS: 100.0000 mg | ORAL_TABLET | Freq: Every day | ORAL | Status: DC

## 2014-09-29 NOTE — Patient Instructions (Signed)
1. You have a follow up appointment as follows:  Please return in 4 weeks.   2. Please take all medications as previously prescribed with the following changes:  Continue to take Atenolol, Allopurinol, and Provachol as previously prescribed.   3. If you have worsening of your symptoms or new symptoms arise, please call the clinic ([phone]), or go to the ER immediately if symptoms are severe.  You have done a great job in taking all your medications. Please continue to do this.
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
## 2014-09-30 LAB — PRESCRIPTION ABUSE MONITORING 15P, URINE
Amphetamine/Meth: NEGATIVE ng/mL
BARBITURATE SCREEN, URINE: NEGATIVE ng/mL
BENZODIAZEPINE SCREEN, URINE: NEGATIVE ng/mL
BUPRENORPHINE, URINE: NEGATIVE ng/mL
COCAINE METABOLITES: NEGATIVE ng/mL
Cannabinoid Scrn, Ur: NEGATIVE ng/mL
Carisoprodol, Urine: NEGATIVE ng/mL
Creatinine, Urine: 19.11 mg/dL — ABNORMAL LOW (ref >20.0)
Fentanyl, Ur: NEGATIVE ng/mL
Meperidine, Ur: NEGATIVE ng/mL
Methadone Screen, Urine: NEGATIVE ng/mL
OPIATE SCREEN, URINE: NEGATIVE ng/mL
Oxycodone Screen, Ur: NEGATIVE ng/mL
Propoxyphene: NEGATIVE ng/mL
Tramadol Scrn, Ur: NEGATIVE ng/mL
Zolpidem, Urine: NEGATIVE ng/mL

## 2014-09-30 NOTE — Assessment & Plan Note (Signed)
Patient seemed altered today, w/ slurred speech, tangential thinking, and some mild confusion. Patient describes symptoms of depression, denies suicidal or homicidal ideations. I do not feel the patient is potentially harmful to himself or others. He is very difficult to communicate with today as he is quite easily distractible. Patient most certainly has mild to moderate depression, although the reasons for his change in mood currently may be related to alcohol intoxication, substance abuse, or mania related to previously undiagnosed bipolar disorder. The patient does not exhibit findings suggestive of grandiosity, however, he does have somewhat pressured speech and increased talkativeness, flight of ideas, and distractibility. Once again, I do not feel he is a danger to himself or others, therefore I do not feel it is necessary to have him admitted to the hospital at this time.  -Will give patient cab voucher and have him go to [facility] for further mental health management -Urine drug screen

## 2014-09-30 NOTE — Assessment & Plan Note (Signed)
Patient does seem to have been taking Allopurinol since started at his last visit. Denies elbow pain currently. Will need follow up uric acid level, however, do not feel today is a good day to obtain this lab given his overall clinical picture.  -Repeat uric acid at next clinic visit

## 2014-09-30 NOTE — Assessment & Plan Note (Signed)
BP Readings from Last 3 Encounters:  09/29/14 139/92  08/11/14 144/97  07/07/14 133/88    Lab Results  Component Value Date   NA 133* 07/07/2014   K 4.6 07/07/2014   CREATININE 0.92 07/07/2014    Assessment: Comments: BP reasonable today given the patient's overall presentation. States he is taking his Atenolol 100 mg daily as previously prescribed.   Plan: Medications:  continue current medications; Atenolol 100 mg daily. Will check UDS today given risk of this medication in the setting of cocaine abuse, although, patient not recently positive for cocaine use and denies using in the recent past.  Educational resources provided: brochure (denies) Other plans: RTC in 4-6 weeks.

## 2014-09-30 NOTE — Progress Notes (Signed)
Internal Medicine Clinic Attending  I saw and evaluated the patient.  I personally confirmed the key portions of the history and exam documented by Dr. Jones and I reviewed pertinent patient test results.  The assessment, diagnosis, and plan were formulated together and I agree with the documentation in the resident's note.     

## 2014-09-30 NOTE — Progress Notes (Signed)
CSW met with pt briefly in hallway, pt requesting to meet with CSW.  CSW met with [name] prior to physician.  Pt requesting two bus passes and is aware he will need to complete [facility] card transfer from prior clinic to [facility] with financial counselor.  CSW provided [name] with 2 bus passes and information to the [facility] transportation phone number.  During this time, pt voiced multiple issues stemming from: sister in [place] dx with Cancer, homelessness, grief issues, lack of social support and relying less on [employer].  [name] would like to remain in the [place] area to be close to his sister that is ill and lives in [place].  Niece lives in [place] and he does not want to return there.  [name] mention seeing a behavioral health provider in [place] and states "I know I'm crazy but I'm not that crazy."  It was very difficult to redirect [name] to a specific topic of discussion as CSW wanted to offer pt with available counseling services. [name] aware CSW is available to assist as needed. Discussed potential housing program which he may qualify.  Pt signed ROI.  [name] aware the process will be lengthy.   

## 2014-11-23 ENCOUNTER — Other Ambulatory Visit: Payer: Self-pay | Admitting: *Deleted

## 2014-11-23 DIAGNOSIS — M1A0210 Idiopathic chronic gout, right elbow, without tophus (tophi): Secondary | ICD-10-CM

## 2014-11-23 MED ORDER — NAPROXEN 500 MG PO TABS: 500.0000 mg | ORAL_TABLET | Freq: Two times a day (BID) | ORAL | Status: DC | PRN

## 2014-11-25 NOTE — Telephone Encounter (Signed)
Called to pharm 

## 2014-12-30 ENCOUNTER — Ambulatory Visit: Payer: Self-pay

## 2015-01-12 ENCOUNTER — Encounter: Payer: Self-pay | Admitting: Internal Medicine

## 2015-01-12 ENCOUNTER — Ambulatory Visit (INDEPENDENT_AMBULATORY_CARE_PROVIDER_SITE_OTHER): Payer: Self-pay | Admitting: Internal Medicine

## 2015-01-12 VITALS — BP 133/93 | HR 100 | Temp 97.5°F | Ht 70.0 in | Wt 154.3 lb

## 2015-01-12 DIAGNOSIS — Z0000 Encounter for general adult medical examination without abnormal findings: Secondary | ICD-10-CM

## 2015-01-12 DIAGNOSIS — I10 Essential (primary) hypertension: Secondary | ICD-10-CM

## 2015-01-12 DIAGNOSIS — F900 Attention-deficit hyperactivity disorder, predominantly inattentive type: Secondary | ICD-10-CM

## 2015-01-12 DIAGNOSIS — R41840 Attention and concentration deficit: Secondary | ICD-10-CM

## 2015-01-12 DIAGNOSIS — K644 Residual hemorrhoidal skin tags: Secondary | ICD-10-CM | POA: Insufficient documentation

## 2015-01-12 DIAGNOSIS — M1A0210 Idiopathic chronic gout, right elbow, without tophus (tophi): Secondary | ICD-10-CM

## 2015-01-12 MED ORDER — HYDROCORTISONE 2.5 % RE CREA: TOPICAL_CREAM | RECTAL | Status: DC

## 2015-01-12 NOTE — Patient Instructions (Signed)
1. Please schedule a follow up appointment for 6 months.  PLEASE GO TO [facility] TO DISCUSS ANXIETY AND DEPRESSION: [address] [place], [place] [address] [phone]  2. Please take all medications as previously prescribed.  3. If you have worsening of your symptoms or new symptoms arise, please call the clinic ([phone]), or go to the ER immediately if symptoms are severe.

## 2015-01-13 DIAGNOSIS — R41840 Attention and concentration deficit: Secondary | ICD-10-CM | POA: Insufficient documentation

## 2015-01-13 NOTE — Progress Notes (Signed)
Subjective:   Patient ID: Devin [name] male   DOB: July 09, 1960 54 y.o.   MRN: [id number]  HPI: [name] is a 54 y.o. male w/ PMHx of HTN, HLD, remote h/o MI, Chronic Gout, h/o alcohol and substance abuse, and homelessness, presents to the clinic today for a follow-up visit regarding his gout and mental health issues. During his last visit, Devin [name] was quite easily distractable w/ flight of ideas, tangential speech, and emotional lability. There was some question of alcohol intoxication at that time, although the patient denied this as a possibility. UDS negative at that time. He was sent in a cab to [facility] mental health for further evaluation, however, he states he got out of the cab, went inside, and then left without seeing a mental health professional.  Today, the patient is somewhat improved since his last visit, but still has easy distractability, mild flight of ideas, and tangential speech. He denies symptoms of suicidal ideation. He does admit to mild symptoms of depression but seems improved from last visit as well. Patient states that some of his sadness stems from his sister having cancer.  Patient does seem to be compliant w/ his other medications, still states he is homeless but is living in a place that gives him shelter.    Current Outpatient Prescriptions  Medication Sig Dispense Refill  . allopurinol (ZYLOPRIM) 300 MG tablet Take 1 tablet (300 mg total) by mouth daily. 30 tablet 5  . aspirin EC 81 MG tablet Take 1 tablet (81 mg total) by mouth daily. 30 tablet 3  . atenolol (TENORMIN) 100 MG tablet Take 1 tablet (100 mg total) by mouth daily. 30 tablet 5  . hydrocortisone (ANUSOL-HC) 2.5 % rectal cream Apply rectally 2 times daily 30 g 1  . naproxen (NAPROSYN) 500 MG tablet Take 1 tablet (500 mg total) by mouth 2 (two) times daily as needed for mild pain, moderate pain or headache (TAKE WITH MEALS.). 60 tablet 0  . nitroGLYCERIN (NITROSTAT) 0.4 MG SL tablet Place 1  tablet (0.4 mg total) under the tongue every 5 (five) minutes as needed for chest pain. (Patient not taking: Reported on 07/07/2014) 15 tablet 12  . pravastatin (PRAVACHOL) 40 MG tablet Take 1 tablet (40 mg total) by mouth daily. 30 tablet 5   No current facility-administered medications for this visit.    Review of Systems  General: Denies fever, diaphoresis, appetite change, and fatigue.  Respiratory: Denies SOB, cough, and wheezing.   Cardiovascular: Denies chest pain and palpitations.  Gastrointestinal: Denies nausea, vomiting, abdominal pain, and diarrhea Musculoskeletal: Positive for mild right elbow pain. Denies myalgias, back pain, and gait problem.  Neurological: Denies dizziness, syncope, weakness, lightheadedness, and headaches.  Psychiatric/Behavioral: Positive for mood changes Denies sleep disturbance, and agitation.   Objective:   Physical Exam: Filed Vitals:   01/12/15 1548  BP: 133/93  Pulse: 100  Temp: 97.5 F (36.4 C)  TempSrc: Oral  Height: 5\' 10"  (1.778 m)  Weight: 154 lb 4.8 oz (69.99 kg)  SpO2: 100%    General: Thin-appearing AA male, alert, mild slurred speech, tangential thinking. HEENT: PERRL, EOMI. Moist mucus membranes. Poor dentition.  Neck: Full range of motion without pain, supple, no lymphadenopathy or carotid bruits Lungs: Clear to ascultation bilaterally, normal work of respiration, no wheezes, rales, rhonchi Heart: RRR, no murmurs, gallops, or rubs Abdomen: Soft, non-tender, non-distended, BS + Extremities: No cyanosis, clubbing, or edema Neurologic: Alert & oriented, cranial nerves II-XII intact, strength grossly  intact, sensation intact to light touch   Assessment & Plan:   Please see problem based assessment and plan.

## 2015-01-13 NOTE — Assessment & Plan Note (Signed)
Flu shot given today

## 2015-01-13 NOTE — Assessment & Plan Note (Signed)
Patient once again seems to be quite tangential and easily distractable on exam. He is not as labile today as he was during his previous clinic visit. He was placed in a cab and sent to [facility] at his last clinic visit, however, he states he got out of the cab, waited only for a short time at [facility] and then left without seeing a mental health professional. This was in fact confirmed by me following his last visit. Patient continues to deny drug or alcohol abuse (recent UDS negative). I suspect that patient may have underlying Bipolar Disorder which contributes to his depression, anxiety, distractability, flight of ideas, and tangential speech. He denies suicidal or homicidal ideations and I do not feel he is currently a harm to himself or others.  -Once again reinforced the importance of seeing a mental health professional at [facility]. Patient given address and phone number. States he will go.  -RTC in 6 months for follow up

## 2015-01-13 NOTE — Assessment & Plan Note (Signed)
BP Readings from Last 3 Encounters:  01/12/15 133/93  09/29/14 139/92  08/11/14 144/97    Lab Results  Component Value Date   NA 133* 07/07/2014   K 4.6 07/07/2014   CREATININE 0.92 07/07/2014    Assessment: Blood pressure control:   Controlled, mild elevation of DBP.  Comments: Seems to be compliant w/ Atenolol but really difficult to tell.   Plan: Medications:  continue current medications; Atenolol 100 mg daily Other plans: RTC in 6 months

## 2015-01-13 NOTE — Assessment & Plan Note (Signed)
Patient w/ continued pain in his right elbow, although no point tenderness, erythema, or increased warmth. ROM intact, but w/ some pain w/ full extension. Do not feel this is acute gout flare, feel there is likely some underlying OA causing some discomfort. It does seem that patient is taking Allopurinol 300 mg daily, as he brought this Rx with him today and states he is in fact taking it. Has not had a recent gout flare to my knowledge.  -Given poor understanding of medications and current control of gout, do not feel it is necessary to check uric acid level today as I do not feel this will change management of his current condition.  -Continue Allopurinol 300 mg daily

## 2015-01-13 NOTE — Assessment & Plan Note (Signed)
Patient complaining of painful bleeding external hemorrhoids.  -Anusol prn for now -Also discussed Sits baths, etc, however, feel this would be very difficult as patient is homeless.

## 2015-01-13 NOTE — Progress Notes (Signed)
Internal Medicine Clinic Attending  Case discussed with Dr. Jones at the time of the visit.  We reviewed the resident's history and exam and pertinent patient test results.  I agree with the assessment, diagnosis, and plan of care documented in the resident's note.  

## 2015-01-14 ENCOUNTER — Encounter (HOSPITAL_COMMUNITY): Payer: Self-pay

## 2015-01-14 ENCOUNTER — Other Ambulatory Visit: Payer: Self-pay

## 2015-01-14 ENCOUNTER — Emergency Department (HOSPITAL_COMMUNITY): Payer: Self-pay

## 2015-01-14 ENCOUNTER — Observation Stay (HOSPITAL_COMMUNITY)
Admission: EM | Admit: 2015-01-14 | Discharge: 2015-01-15 | Disposition: A | Discharge status: Home / Self Care | Payer: Self-pay | Attending: Internal Medicine | Admitting: Internal Medicine

## 2015-01-14 ENCOUNTER — Other Ambulatory Visit (HOSPITAL_COMMUNITY): Payer: Self-pay

## 2015-01-14 DIAGNOSIS — Z720 Tobacco use: Secondary | ICD-10-CM

## 2015-01-14 DIAGNOSIS — R112 Nausea with vomiting, unspecified: Secondary | ICD-10-CM | POA: Insufficient documentation

## 2015-01-14 DIAGNOSIS — I119 Hypertensive heart disease without heart failure: Secondary | ICD-10-CM | POA: Diagnosis present

## 2015-01-14 DIAGNOSIS — I43 Cardiomyopathy in diseases classified elsewhere: Secondary | ICD-10-CM

## 2015-01-14 DIAGNOSIS — E785 Hyperlipidemia, unspecified: Secondary | ICD-10-CM | POA: Diagnosis present

## 2015-01-14 DIAGNOSIS — M1A9XX0 Chronic gout, unspecified, without tophus (tophi): Secondary | ICD-10-CM

## 2015-01-14 DIAGNOSIS — M549 Dorsalgia, unspecified: Secondary | ICD-10-CM | POA: Insufficient documentation

## 2015-01-14 DIAGNOSIS — R0602 Shortness of breath: Secondary | ICD-10-CM | POA: Insufficient documentation

## 2015-01-14 DIAGNOSIS — G8929 Other chronic pain: Secondary | ICD-10-CM | POA: Insufficient documentation

## 2015-01-14 DIAGNOSIS — I252 Old myocardial infarction: Secondary | ICD-10-CM | POA: Insufficient documentation

## 2015-01-14 DIAGNOSIS — R0789 Other chest pain: Secondary | ICD-10-CM

## 2015-01-14 DIAGNOSIS — F17210 Nicotine dependence, cigarettes, uncomplicated: Secondary | ICD-10-CM | POA: Insufficient documentation

## 2015-01-14 DIAGNOSIS — I25110 Atherosclerotic heart disease of native coronary artery with unstable angina pectoris: Secondary | ICD-10-CM

## 2015-01-14 DIAGNOSIS — M109 Gout, unspecified: Secondary | ICD-10-CM | POA: Insufficient documentation

## 2015-01-14 DIAGNOSIS — R079 Chest pain, unspecified: Principal | ICD-10-CM

## 2015-01-14 DIAGNOSIS — I10 Essential (primary) hypertension: Secondary | ICD-10-CM | POA: Insufficient documentation

## 2015-01-14 DIAGNOSIS — Z7982 Long term (current) use of aspirin: Secondary | ICD-10-CM | POA: Insufficient documentation

## 2015-01-14 DIAGNOSIS — Z590 Homelessness: Secondary | ICD-10-CM | POA: Insufficient documentation

## 2015-01-14 DIAGNOSIS — F1020 Alcohol dependence, uncomplicated: Secondary | ICD-10-CM

## 2015-01-14 DIAGNOSIS — I2510 Atherosclerotic heart disease of native coronary artery without angina pectoris: Secondary | ICD-10-CM | POA: Insufficient documentation

## 2015-01-14 DIAGNOSIS — I259 Chronic ischemic heart disease, unspecified: Secondary | ICD-10-CM | POA: Insufficient documentation

## 2015-01-14 DIAGNOSIS — F1290 Cannabis use, unspecified, uncomplicated: Secondary | ICD-10-CM | POA: Insufficient documentation

## 2015-01-14 LAB — BASIC METABOLIC PANEL
Anion gap: 12 (ref 5–15)
BUN: 15 mg/dL (ref 6–20)
CO2: 23 mmol/L (ref 22–32)
CREATININE: 0.96 mg/dL (ref 0.61–1.24)
Calcium: 8.8 mg/dL — ABNORMAL LOW (ref 8.9–10.3)
Chloride: 97 mmol/L — ABNORMAL LOW (ref 101–111)
Glucose, Bld: 75 mg/dL (ref 65–99)
POTASSIUM: 4 mmol/L (ref 3.5–5.1)
SODIUM: 132 mmol/L — AB (ref 135–145)

## 2015-01-14 LAB — I-STAT TROPONIN, ED
Troponin i, poc: 0.01 ng/mL (ref 0.00–0.08)
Troponin i, poc: 0.01 ng/mL (ref 0.00–0.08)

## 2015-01-14 LAB — CBC
HCT: 37.5 % — ABNORMAL LOW (ref 39.0–52.0)
Hemoglobin: 12.6 g/dL — ABNORMAL LOW (ref 13.0–17.0)
MCH: 32.6 pg (ref 26.0–34.0)
MCHC: 33.6 g/dL (ref 30.0–36.0)
MCV: 96.9 fL (ref 78.0–100.0)
PLATELETS: 255 10*3/uL (ref 150–400)
RBC: 3.87 MIL/uL — ABNORMAL LOW (ref 4.22–5.81)
RDW: 13.8 % (ref 11.5–15.5)
WBC: 4.2 10*3/uL (ref 4.0–10.5)

## 2015-01-14 MED ORDER — NITROGLYCERIN 2 % TD OINT
1.0000 [in_us] | TOPICAL_OINTMENT | Freq: Once | TRANSDERMAL | Status: AC
  Administered 2015-01-14: 1 [in_us] via TOPICAL
  Filled 2015-01-14: qty 1
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
## 2015-01-14 NOTE — ED Notes (Signed)
PER EMS: pt reports sharp, tingling chest pain onset around 3pm when walking to [employer], associated with SOB and pain radiates down left arm; and this morning he reports nausea and vomiting. Pain worse with inspiration and palpation. EKG shows LVH. BP- 160/90, HR-88, 100% RA.
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
## 2015-01-15 ENCOUNTER — Other Ambulatory Visit: Payer: Self-pay | Admitting: Student

## 2015-01-15 ENCOUNTER — Other Ambulatory Visit: Payer: Self-pay | Admitting: Physician Assistant

## 2015-01-15 DIAGNOSIS — R072 Precordial pain: Secondary | ICD-10-CM

## 2015-01-15 DIAGNOSIS — R079 Chest pain, unspecified: Secondary | ICD-10-CM

## 2015-01-15 LAB — CBC
HEMATOCRIT: 42.5 % (ref 39.0–52.0)
Hemoglobin: 13.9 g/dL (ref 13.0–17.0)
MCH: 32.4 pg (ref 26.0–34.0)
MCHC: 32.7 g/dL (ref 30.0–36.0)
MCV: 99.1 fL (ref 78.0–100.0)
PLATELETS: 312 10*3/uL (ref 150–400)
RBC: 4.29 MIL/uL (ref 4.22–5.81)
RDW: 13.9 % (ref 11.5–15.5)
WBC: 4 10*3/uL (ref 4.0–10.5)

## 2015-01-15 LAB — COMPREHENSIVE METABOLIC PANEL
ALBUMIN: 3.8 g/dL (ref 3.5–5.0)
ALT: 58 U/L (ref 17–63)
AST: 165 U/L — AB (ref 15–41)
Alkaline Phosphatase: 99 U/L (ref 38–126)
Anion gap: 10 (ref 5–15)
BUN: 11 mg/dL (ref 6–20)
CHLORIDE: 99 mmol/L — AB (ref 101–111)
CO2: 25 mmol/L (ref 22–32)
CREATININE: 1.1 mg/dL (ref 0.61–1.24)
Calcium: 9.2 mg/dL (ref 8.9–10.3)
GFR calc Af Amer: >60 mL/min (ref >60)
GLUCOSE: 90 mg/dL (ref 65–99)
Potassium: 3.7 mmol/L (ref 3.5–5.1)
Sodium: 134 mmol/L — ABNORMAL LOW (ref 135–145)
Total Bilirubin: 1.2 mg/dL (ref 0.3–1.2)
Total Protein: 8.2 g/dL — ABNORMAL HIGH (ref 6.5–8.1)

## 2015-01-15 LAB — LIPASE, BLOOD: Lipase: 38 U/L (ref 22–51)

## 2015-01-15 LAB — TROPONIN I: Troponin I: <0.03 ng/mL (ref <0.031)

## 2015-01-15 MED ORDER — PANTOPRAZOLE SODIUM 40 MG PO TBEC
40.0000 mg | DELAYED_RELEASE_TABLET | Freq: Every day | ORAL | Status: DC
  Administered 2015-01-15: 40 mg via ORAL
  Filled 2015-01-15: qty 1

## 2015-01-15 MED ORDER — DICLOFENAC SODIUM 1 % TD GEL
4.0000 g | Freq: Four times a day (QID) | TRANSDERMAL | Status: DC
  Filled 2015-01-15: qty 100

## 2015-01-15 MED ORDER — VITAMIN B-1 100 MG PO TABS
100.0000 mg | ORAL_TABLET | Freq: Every day | ORAL | Status: DC
  Administered 2015-01-15: 100 mg via ORAL
  Filled 2015-01-15: qty 1

## 2015-01-15 MED ORDER — ADULT MULTIVITAMIN W/MINERALS CH
1.0000 | ORAL_TABLET | Freq: Every day | ORAL | Status: DC
  Administered 2015-01-15: 1 via ORAL
  Filled 2015-01-15: qty 1

## 2015-01-15 MED ORDER — PANTOPRAZOLE SODIUM 40 MG PO TBEC: 40.0000 mg | DELAYED_RELEASE_TABLET | Freq: Every day | ORAL | Status: DC

## 2015-01-15 MED ORDER — ALLOPURINOL 300 MG PO TABS
300.0000 mg | ORAL_TABLET | Freq: Every day | ORAL | Status: DC
  Administered 2015-01-15: 300 mg via ORAL
  Filled 2015-01-15: qty 1

## 2015-01-15 MED ORDER — SODIUM CHLORIDE 0.9 % IV SOLN
INTRAVENOUS | Status: DC
  Administered 2015-01-15: 02:00:00 via INTRAVENOUS

## 2015-01-15 MED ORDER — ASPIRIN EC 81 MG PO TBEC
81.0000 mg | DELAYED_RELEASE_TABLET | Freq: Every day | ORAL | Status: DC
  Administered 2015-01-15: 81 mg via ORAL
  Filled 2015-01-15: qty 1

## 2015-01-15 MED ORDER — DICLOFENAC SODIUM 1 % TD GEL: 4.0000 g | Freq: Four times a day (QID) | TRANSDERMAL | Status: DC

## 2015-01-15 MED ORDER — LORAZEPAM 1 MG PO TABS
1.0000 mg | ORAL_TABLET | Freq: Four times a day (QID) | ORAL | Status: DC | PRN
Start: 2015-01-15 — End: 2015-01-15

## 2015-01-15 MED ORDER — PRAVASTATIN SODIUM 40 MG PO TABS: 40.0000 mg | ORAL_TABLET | Freq: Every day | ORAL | Status: DC

## 2015-01-15 MED ORDER — FOLIC ACID 1 MG PO TABS
1.0000 mg | ORAL_TABLET | Freq: Every day | ORAL | Status: DC
  Administered 2015-01-15: 1 mg via ORAL
  Filled 2015-01-15: qty 1

## 2015-01-15 MED ORDER — ATENOLOL 25 MG PO TABS
100.0000 mg | ORAL_TABLET | Freq: Every day | ORAL | Status: DC
  Administered 2015-01-15: 100 mg via ORAL
  Filled 2015-01-15: qty 4

## 2015-01-15 MED ORDER — THIAMINE HCL 100 MG/ML IJ SOLN: 100.0000 mg | Freq: Every day | INTRAMUSCULAR | Status: DC

## 2015-01-15 MED ORDER — ONDANSETRON HCL 4 MG/2ML IJ SOLN: 4.0000 mg | Freq: Four times a day (QID) | INTRAMUSCULAR | Status: DC | PRN

## 2015-01-15 MED ORDER — NITROGLYCERIN 0.4 MG SL SUBL: 0.4000 mg | SUBLINGUAL_TABLET | SUBLINGUAL | Status: DC | PRN

## 2015-01-15 MED ORDER — ATORVASTATIN CALCIUM 40 MG PO TABS
40.0000 mg | ORAL_TABLET | Freq: Every day | ORAL | Status: DC
Start: 2015-01-15 — End: 2015-06-21

## 2015-01-15 MED ORDER — ATORVASTATIN CALCIUM 40 MG PO TABS: 40.0000 mg | ORAL_TABLET | Freq: Every day | ORAL | Status: DC

## 2015-01-15 MED ORDER — ACETAMINOPHEN 325 MG PO TABS: 650.0000 mg | ORAL_TABLET | ORAL | Status: DC | PRN

## 2015-01-15 MED ORDER — ENOXAPARIN SODIUM 40 MG/0.4ML ~~LOC~~ SOLN
40.0000 mg | Freq: Every day | SUBCUTANEOUS | Status: DC
  Administered 2015-01-15: 40 mg via SUBCUTANEOUS
  Filled 2015-01-15: qty 0.4

## 2015-01-15 MED ORDER — LORAZEPAM 2 MG/ML IJ SOLN
1.0000 mg | Freq: Four times a day (QID) | INTRAMUSCULAR | Status: DC | PRN
Start: 2015-01-15 — End: 2015-01-15

## 2015-01-15 NOTE — Care Management Note (Signed)
Case Management Note  Patient Details  Name: Devin [name] MRN: [id number] Date of Birth: 08/02/1960  Subjective/Objective:  Pt admitted with chest pain/palpitations                  Action/Plan:  Pt is independent staying with friends (per pt; he is currently homeless, however he has friends that allow him to stay with him).  CSW consult ordered.  Pt stated he goes to the [facility] and sees Dr Ronnald Ramp, pt verified that he gets his medications filled with the orange card, pt denied hardship paying/obtaining medications.  Financial Consuelor at bedside during assessement, informed pt that he had been approved for "hosptial bill only" charity, counselor to follow up within month for possible medicaid application/approval.  CM will continue to monitor for disposition needs.   Expected Discharge Date:  01/16/15               Expected Discharge Plan:  Home/Self Care  In-House Referral:  Clinical Social Work  Discharge planning Services  CM Consult  Post Acute Care Choice:    Choice offered to:     DME Arranged:    DME Agency:     HH Arranged:    HH Agency:     Status of Service:  In process, will continue to follow  Medicare Important Message Given:    Date Medicare IM Given:    Medicare IM give by:    Date Additional Medicare IM Given:    Additional Medicare Important Message give by:     If discussed at [place] of Stay Meetings, dates discussed:    Additional Comments:  [name], RN 01/15/2015, 10:45 AM

## 2015-01-15 NOTE — Progress Notes (Signed)
Patient Name: Devin [name] Date of Encounter: 01/15/2015  Active Problems:   Essential hypertension   Hyperlipidemia   Chest pain     Primary Cardiologist: Dr. Acie Fredrickson Patient Profile: 54 yo male w/ PMH of CAD (s/p MI 2003 - medically managed), HTN, HLD, homelessness, and prior alcohol and substance abuse who presented to [facility] ED on 01/14/2015 for new onset chest pain that lasted for 20 minutes.  SUBJECTIVE: Feeling well. Denies any recurrence of his chest pain or shortness of breath.   OBJECTIVE Filed Vitals:   01/15/15 0000 01/15/15 0030 01/15/15 0100 01/15/15 0130  BP: 136/92 145/93 133/96 152/104  Pulse: 73 73 71 76  Temp:    98.2 F (36.8 C)  TempSrc:    Oral  Resp: 11 15 13 18   Height:    5\' 10"  (1.778 m)  Weight:    149 lb 3.2 oz (67.677 kg)  SpO2: 100% 100% 100% 100%   No intake or output data in the 24 hours ending 01/15/15 1125 Filed Weights   01/14/15 1615 01/15/15 0130  Weight: 153 lb (69.4 kg) 149 lb 3.2 oz (67.677 kg)    PHYSICAL EXAM General: Well developed, well nourished, male in no acute distress. Head: Normocephalic, atraumatic.  Neck: Supple without bruits, JVD not elevated. Lungs:  Resp regular and unlabored, CTA without wheezing or rales. Heart: RRR, S1, S2, no S3, S4, or murmur; no rub. Abdomen: Soft, non-tender, non-distended with normoactive bowel sounds. No hepatomegaly. No rebound/guarding. No obvious abdominal masses. Extremities: No clubbing, cyanosis, or edema. Distal pedal pulses are 2+ bilaterally. Neuro: Alert and oriented X 3. Moves all extremities spontaneously. Psych: Normal affect.   LABS: CBC:  Recent Labs  01/14/15 1625 01/15/15 0745  WBC 4.2 4.0  HGB 12.6* 13.9  HCT 37.5* 42.5  MCV 96.9 99.1  PLT 255 417   Basic Metabolic Panel:  Recent Labs  01/14/15 1625 01/15/15 0745  NA 132* 134*  K 4.0 3.7  CL 97* 99*  CO2 23 25  GLUCOSE 75 90  BUN 15 11  CREATININE 0.96 1.10  CALCIUM 8.8* 9.2   Liver  Function Tests:  Recent Labs  01/15/15 0745  AST 165*  ALT 58  ALKPHOS 99  BILITOT 1.2  PROT 8.2*  ALBUMIN 3.8   Cardiac Enzymes:  Recent Labs  01/15/15 0321 01/15/15 0745  TROPONINI <0.03 <0.03    Recent Labs  01/14/15 1645 01/14/15 2115  TROPIPOC 0.01 0.01    TELE:    NSR with rate in 70's - 80's.     Radiology/Studies: Dg Chest 2 View: 01/14/2015   CLINICAL DATA:  Shortness of breath and chest pain for 1 day  EXAM: CHEST  2 VIEW  COMPARISON:  May 03, 2013  FINDINGS: There are stable nipple shadows bilaterally. There is no edema or consolidation. Heart size and pulmonary vascularity are normal. No adenopathy. There is an old healed fracture of the posterior left sixth rib. There is evidence of old trauma involving the proximal left humeral metaphysis. No pneumothorax. There is mild mid thoracic levoscoliosis.  IMPRESSION: No edema or consolidation. Evidence of prior bony trauma on the left.   Electronically Signed   By: Lowella Grip III M.D.   On: 01/14/2015 16:52    Current Medications:  . allopurinol  300 mg Oral Daily  . aspirin EC  81 mg Oral Daily  . atenolol  100 mg Oral Daily  . atorvastatin  40 mg Oral q1800  .  enoxaparin (LOVENOX) injection  40 mg Subcutaneous Daily  . folic acid  1 mg Oral Daily  . multivitamin with minerals  1 tablet Oral Daily  . pantoprazole  40 mg Oral Daily  . thiamine  100 mg Oral Daily   Or  . thiamine  100 mg Intravenous Daily   . sodium chloride 100 mL/hr at 01/15/15 0206    ASSESSMENT AND PLAN:  1. Chest pain - initially lasted 20 minutes on 01/14/2015, was reproducible with palpation. EKG notable for T-wave inversion in V3, V4, V5. T-wave inversion in V4 and V5 noted in previous EKG's from 37/9432. - cyclic troponins have been negative at < 0.03. - continue ASA, statin and BB. - Outpatient stress test and subsequent follow-up appointment following the test have been scheduled. The patient was informed not to consume  any food and only sips with medications after midnight the day of his stress test. This information has been included in his discharge instructions as well.  2. HTN - BP has been 133/90 - 163/104 in the past 24 hours - continue current medication  3. HLD - continue statin therapy  Signed, Erma Heritage , PA-C 11:25 AM 01/15/2015 Pager: [phone]   The patient was seen, examined and discussed with Bernerd Pho, PA-C and I agree with the above.    54 year old male with atypical chest pain, ruled out for ACS, negative troponin x 3, unchanged ECG, we will schedule him for an outpatient stress testing. Continue ASA, atorvastatin and atenolol.   Dorothy Spark 01/15/2015
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
## 2015-01-15 NOTE — Discharge Instructions (Signed)
Medication changes are as follows:  - Stop taking daily pravachol, start taking Lipitor 40mg  once daily - Start taking Protonix(pantoprazole) 40mg  once daily in the morning, with water only before breakfast when possible - Stop taking naproxen for antiinflammatory treatment. Try using topical treatment with Voltaren gel if possible. Discuss alternatives with your primary doctor.  You have a Stress Test scheduled at [facility] on 01/20/2015. Your doctor has ordered this test to get a better idea of how your heart works.  Please arrive 15 minutes early for paperwork.   Location: [place], [address], [place] [address] [phone]  Instructions:  No food/drink after midnight the night before.   No caffeine/decaf products 24 hours before, including medicines such as Excedrin or Goody Powders. Call if there are any questions.   Wear comfortable clothes and shoes.   It is OK to take your morning meds with a sip of water EXCEPT for those types of medicines listed below or otherwise instructed.  Special Medication Instructions:  Beta blockers such as metoprolol (Lopressor/Toprol XL), atenolol (Tenormin), carvedilol (Coreg), nebivolol (Bystolic), propranolol (Inderal) should not be taken for 24 hours before the test.  Calcium channel blockers such as diltiazem (Cardizem) or verapmil (Calan) should not be taken for 24 hours before the test.  Remove nitroglycerin patches and do not take nitrate preparations such as Imdur/isosorbide the day of your test.  No Persantine/Theophylline or Aggrenox medicines should be used within 24 hours of the test.   What To Expect: The whole test will take several hours. When you arrive in the lab, the technician will inject a small amount of radioactive tracer into your arm through an IV while you are resting quietly. This helps [place] to form pictures of your heart. You will likely only  feel a sting from the IV. After a waiting period, resting pictures will be obtained under a big camera. These are the "before" pictures.  Next, you will be prepped for the stress portion of the test. This may include either walking on a treadmill or receiving a medicine that helps to dilate blood vessels in your heart to simulate the effect of exercise on your heart. If you are walking on a treadmill, you will walk at different paces to try to get your heart rate to a goal number that is based on your age. If your doctor has chosen the pharmacologic test, then you will receive a medicine through your IV that may cause temporary nausea, flushing, shortness of breath and sometimes chest discomfort or vomiting. This is typically short-lived and usually resolves quickly. Your blood pressure and heart rate will be monitored, and we will be watching your EKG on a computer screen for any changes. During this portion of the test, the radiologist will inject another small amount of radioactive tracer into your IV. After a waiting period, you will undergo a second set of pictures. These are the "after" pictures.  The doctor reading the test will compare the before-and-after images to look for evidence of heart blockages or heart weakness. In certain instances, this test is done over 2 days but usually only takes 1 day to complete.

## 2015-01-15 NOTE — Progress Notes (Signed)
Subjective: Patient asymptomatic overnight. In no pain or distress when seen on rounds. NPO at this time for possible stress test but may likely be deferred to outpatient setting. He was up and grooming himself at arrival for morning assessment.  Objective: Vital signs in last 24 hours: Filed Vitals:   01/15/15 0000 01/15/15 0030 01/15/15 0100 01/15/15 0130  BP: 136/92 145/93 133/96 152/104  Pulse: 73 73 71 76  Temp:    98.2 F (36.8 C)  TempSrc:    Oral  Resp: 11 15 13 18   Height:    5\' 10"  (1.778 m)  Weight:    67.677 kg (149 lb 3.2 oz)  SpO2: 100% 100% 100% 100%   Weight change:  No intake or output data in the 24 hours ending 01/15/15 1112   GENERAL- alert, co-operative, NAD HEENT- Poor dentition, moist oral mucosa CARDIAC- RRR, no murmurs, rubs or gallops. RESP- CTAB, no wheezes or crackles. CHEST- No chest wall TTP ABDOMEN- Soft, nontender, no guarding or rebound, normoactive bowel sounds present NEURO- No obvious Cr N abnormality, strength upper and lower extremities- 5/5 Gait- Normal. EXTREMITIES- pulse 2+, symmetric, no pedal edema., well healed surgical scars on lower extremities b/l SKIN- Warm, dry, No rash or lesion. PSYCH- Normal mood and affect, appropriate thought content and speech.  Lab Results: Basic Metabolic Panel:  Recent Labs Lab 01/14/15 1625 01/15/15 0745  NA 132* 134*  K 4.0 3.7  CL 97* 99*  CO2 23 25  GLUCOSE 75 90  BUN 15 11  CREATININE 0.96 1.10  CALCIUM 8.8* 9.2   Liver Function Tests:  Recent Labs Lab 01/15/15 0745  AST 165*  ALT 58  ALKPHOS 99  BILITOT 1.2  PROT 8.2*  ALBUMIN 3.8    Recent Labs Lab 01/15/15 0745  LIPASE 38   No results for input(s): AMMONIA in the last 168 hours. CBC:  Recent Labs Lab 01/14/15 1625 01/15/15 0745  WBC 4.2 4.0  HGB 12.6* 13.9  HCT 37.5* 42.5  MCV 96.9 99.1  PLT 255 312   Cardiac Enzymes:  Recent Labs Lab 01/15/15 0321 01/15/15 0745  TROPONINI <0.03 <0.03   BNP: No  results for input(s): PROBNP in the last 168 hours. D-Dimer: No results for input(s): DDIMER in the last 168 hours. CBG: No results for input(s): GLUCAP in the last 168 hours. Hemoglobin A1C: No results for input(s): HGBA1C in the last 168 hours. Fasting Lipid Panel: No results for input(s): CHOL, HDL, LDLCALC, TRIG, CHOLHDL, LDLDIRECT in the last 168 hours. Thyroid Function Tests: No results for input(s): TSH, T4TOTAL, FREET4, T3FREE, THYROIDAB in the last 168 hours. Coagulation: No results for input(s): LABPROT, INR in the last 168 hours. Anemia Panel: No results for input(s): VITAMINB12, FOLATE, FERRITIN, TIBC, IRON, RETICCTPCT in the last 168 hours. Urine Drug Screen: Drugs of Abuse     Component Value Date/Time   LABOPIA NEG 09/29/2014 1505   LABOPIA NONE DETECTED 05/03/2013 1436   COCAINSCRNUR NEG 09/29/2014 1505   COCAINSCRNUR NONE DETECTED 05/03/2013 1436   LABBENZ NEG 09/29/2014 1505   LABBENZ NONE DETECTED 05/03/2013 1436   LABBENZ NEG 06/08/2010 1600   AMPHETMU NEG 09/29/2014 1505   AMPHETMU NONE DETECTED 05/03/2013 1436   AMPHETMU NEG 06/08/2010 1600   THCU NEG 09/29/2014 1505   THCU POSITIVE* 05/03/2013 1436   LABBARB NEG 09/29/2014 1505   LABBARB NONE DETECTED 05/03/2013 1436    Alcohol Level: No results for input(s): ETH in the last 168 hours. Urinalysis: No results for  input(s): COLORURINE, LABSPEC, PHURINE, GLUCOSEU, HGBUR, BILIRUBINUR, KETONESUR, PROTEINUR, UROBILINOGEN, NITRITE, LEUKOCYTESUR in the last 168 hours.  Invalid input(s): APPERANCEUR   Micro Results: No results found for this or any previous visit (from the past 240 hour(s)). Studies/Results: Dg Chest 2 View  01/14/2015   CLINICAL DATA:  Shortness of breath and chest pain for 1 day  EXAM: CHEST  2 VIEW  COMPARISON:  May 03, 2013  FINDINGS: There are stable nipple shadows bilaterally. There is no edema or consolidation. Heart size and pulmonary vascularity are normal. No adenopathy. There  is an old healed fracture of the posterior left sixth rib. There is evidence of old trauma involving the proximal left humeral metaphysis. No pneumothorax. There is mild mid thoracic levoscoliosis.  IMPRESSION: No edema or consolidation. Evidence of prior bony trauma on the left.   Electronically Signed   By: Lowella Grip III M.D.   On: 01/14/2015 16:52   Medications: I have reviewed the patient's current medications. Scheduled Meds: . allopurinol  300 mg Oral Daily  . aspirin EC  81 mg Oral Daily  . atenolol  100 mg Oral Daily  . enoxaparin (LOVENOX) injection  40 mg Subcutaneous Daily  . folic acid  1 mg Oral Daily  . multivitamin with minerals  1 tablet Oral Daily  . pantoprazole  40 mg Oral Daily  . pravastatin  40 mg Oral Daily  . thiamine  100 mg Oral Daily   Or  . thiamine  100 mg Intravenous Daily   Continuous Infusions: . sodium chloride 100 mL/hr at 01/15/15 0206   PRN Meds:.acetaminophen, LORazepam **OR** LORazepam, nitroGLYCERIN, ondansetron (ZOFRAN) IV Assessment/Plan: Chest Pain: Patient currently asymptomatic, repolarization changes on EKG are resolved on morning repeat, serial troponins are negative x3. Will clear with cardiology service and likely discharge to home today with outpatient cardiology follow up and hospital follow up at [facility]. - ASA 81mg  - F/U Lipid panel - Atenolol 100 mg daily - Nitro prn  Alcohol Use D/o: Lab findings indeterminate for chronic pancreatitis, AST moderately increased. History today also some concern for esophagitis or GI contribution to pain especially given chronic vomiting. Patient would probably benefit from PPI if tolerates this. Possible further work up appropriate in outpatient medicine setting. - Start pantoprazole 40mg  daily  Gout: Allopurinol 300 mg daily HLD: Change pravastatin to atorvastatin for high intensity therapy for ischemic heart disease. HTN: Atenolol 100 mg daily  Diet: HH DVT Ppx: [place] Lovenox FULL  CODE  Dispo:  Most likely will discharge to home today with outpatient cardiac stress planned.  The patient does have a current PCP Corky Sox, MD) and does need an [facility] hospital follow-up appointment after discharge.  The patient does not have transportation limitations that hinder transportation to clinic appointments.    Collier Salina, MD 01/15/2015, 11:12 AM

## 2015-01-15 NOTE — Discharge Summary (Signed)
Name: Devin [name] MRN: [id number] DOB: May 07, 1960 54 y.o. PCP: Corky Sox, MD  Date of Admission: 01/14/2015  4:06 PM Date of Discharge: 01/15/2015 Attending Physician: Aldine Contes, MD  Discharge Diagnosis: Principal Problem:   Chest pain Active Problems:   Essential hypertension   Hyperlipidemia  Discharge Medications:   Medication List    STOP taking these medications        naproxen 500 MG tablet  Commonly known as:  NAPROSYN     pravastatin 40 MG tablet  Commonly known as:  PRAVACHOL      TAKE these medications        allopurinol 300 MG tablet  Commonly known as:  ZYLOPRIM  Take 1 tablet (300 mg total) by mouth daily.     aspirin EC 81 MG tablet  Take 1 tablet (81 mg total) by mouth daily.     atenolol 100 MG tablet  Commonly known as:  TENORMIN  Take 1 tablet (100 mg total) by mouth daily.     atorvastatin 40 MG tablet  Commonly known as:  LIPITOR  Take 1 tablet (40 mg total) by mouth daily at 6 PM.     hydrocortisone 2.5 % rectal cream  Commonly known as:  ANUSOL-HC  Apply rectally 2 times daily     nitroGLYCERIN 0.4 MG SL tablet  Commonly known as:  NITROSTAT  Place 1 tablet (0.4 mg total) under the tongue every 5 (five) minutes as needed for chest pain.     pantoprazole 40 MG tablet  Commonly known as:  PROTONIX  Take 1 tablet (40 mg total) by mouth daily.        Disposition and follow-up:   Mr.Corbin H Bubar was discharged from [facility] in Good condition.  At the hospital follow up visit please address:  1. Chest pain: Moderate risk of cardiac etiology, follow up stress study. Also changed pravastatin to atorvastatin therapy, assess for tolerance.  2. Frequent nausea/vomiting: Endorses intermittent vomiting on awakening. Started on pantoprazole and recommended avoiding naproxen during hospital course for concern this vomiting is contributing to ulcer or esophagitis that would worsen his chest pain.  3.  Labs /  imaging needed at time of follow-up:   4.  Pending labs/ test needing follow-up: NM myocardial perfusion study at 01/20/15  Follow-up Appointments:     Follow-up Information    Follow up with [facility] On 01/20/2015.   Why:  Appointment for your stress test on 01/20/2015 at 8:15 AM. Please read the attached information provided concerning the test. Be sure not to consume any food or drink after midnight on the day of your test, except sips of water with medication.   Contact information:   [address] [place] [address]       Follow up with Melina Copa, PA-C On 02/01/2015.   Specialties:  Cardiology, Radiology   Why:  Cardiology Follow-Up Appointment on 02/01/2015 at 9:30 AM.   Contact information:   [address] [place] [address] [phone]       Follow up with Zada Finders, MD. Go on 01/28/2015.   Specialty:  Internal Medicine   Why:  @ 9:15 for hospital follow up, medication management   Contact information:   [address] [place] [address] [phone]       Discharge Instructions:   Consultations: Treatment Team:  Rounding Lbcardiology, MD  Procedures Performed:  Dg Chest 2 View  01/14/2015   CLINICAL DATA:  Shortness of breath and chest pain for 1 day  EXAM: CHEST  2 VIEW  COMPARISON:  May 03, 2013  FINDINGS: There are stable nipple shadows bilaterally. There is no edema or consolidation. Heart size and pulmonary vascularity are normal. No adenopathy. There is an old healed fracture of the posterior left sixth rib. There is evidence of old trauma involving the proximal left humeral metaphysis. No pneumothorax. There is mild mid thoracic levoscoliosis.  IMPRESSION: No edema or consolidation. Evidence of prior bony trauma on the left.   Electronically Signed   By: Lowella Grip III M.D.   On: 01/14/2015 16:52     Admission HPI: Mr. Aro is a 54 yo male with HTN, HLD, gout, and reported  h/o MI (2003), presenting with 1 day h/o chest pain. He reports the sudden onset of sharp, left sided chest pain, rated 8/10. The pain lasted 20-25 minutes, and was associated with left arm tingling. He denies pain radiating to the back. The pain was unrelieved with an extra Atenolol, but subsided while waiting for EMS. He denies changes in pain level a/w nitroglycerin by EMS. During this episode, he endorses diaphoresis, feeling lightheaded, and SOB. He denies nausea or vomiting associated with the acute episode. The pain was similar to his MI in 2003. He often has chest pain with exertion, but the pain always goes away, sometimes while still exerting himself. He also endorses feeling similar pains at rest. Usually these pains subside in minutes. Per cardiology notes, patient states he had an MI at [facility], but cardiac cath at that time was normal. Cardiac cath in 2007 was also normal. His last echo was 2013, showing "vigorous" LV systolic function, with EF 65-70%. Mild dilation of the ascending aorta was also noted.   Although he denies N/V associated with the chest pain, the patient reports often waking up in the morning to vomit. He reports the last time it happened was 2-3 weeks ago, but is not able to say how often it occurs. He denies abdominal pain, unless he has been rigorously vomiting. He reports he drinks 1/2 pint of liquor twice a week, which he has done since he was 54 yo. He thinks he has lost about 12 lbs over the last 4 months. He says his appetite comes and goes, but if the food is not appetizing, he won't eat it. He denies night sweats.  He denies fever, chills, constipation, diarrhea, or dysuria.   He smokes about 10 cigarettes per month, and occasionally marijuana, but denies other illicit drugs.  In the ED, first two Troponins were negative. BP in UE were 162/100 (left) and 148/91 (right). At time of evaluation, he is chest pain free without  dyspnea.  Hospital Course by problem list: Chest pain At ED patient chest pain was controlled and resolved by time of admission. SBP was 140-160s, no acute EKG changes detected. Troponins were trended and negative x3. He was observed overnight and asymptomatic by the next morning. On consultation cardiology service scheduled patient for outpatient NM stress test and he was discharged to home on previous medical regimen for CAD. Due to his history of intermittent awakening to vomit, and moderate level of alcohol use, pantoprazole was started and naproxen discontoinued for concern of GI contribution to his symptoms.  Discharge Vitals:   BP 152/104 mmHg  Pulse 76  Temp(Src) 98.2 F (36.8 C) (Oral)  Resp 18  Ht 5\' 10"  (1.778 m)  Wt 67.677  kg (149 lb 3.2 oz)  BMI 21.41 kg/m2  SpO2 100%  Discharge Labs:  Results for orders placed or performed during the hospital encounter of 01/14/15 (from the past 24 hour(s))  Basic metabolic panel     Status: Abnormal   Collection Time: 01/14/15  4:25 PM  Result Value Ref Range   Sodium 132 (L) 135 - 145 mmol/L   Potassium 4.0 3.5 - 5.1 mmol/L   Chloride 97 (L) 101 - 111 mmol/L   CO2 23 22 - 32 mmol/L   Glucose, Bld 75 65 - 99 mg/dL   BUN 15 6 - 20 mg/dL   Creatinine, Ser 0.96 0.61 - 1.24 mg/dL   Calcium 8.8 (L) 8.9 - 10.3 mg/dL   GFR calc non Af Amer >60 >60 mL/min   GFR calc Af Amer >60 >60 mL/min   Anion gap 12 5 - 15  CBC     Status: Abnormal   Collection Time: 01/14/15  4:25 PM  Result Value Ref Range   WBC 4.2 4.0 - 10.5 K/uL   RBC 3.87 (L) 4.22 - 5.81 MIL/uL   Hemoglobin 12.6 (L) 13.0 - 17.0 g/dL   HCT 37.5 (L) 39.0 - 52.0 %   MCV 96.9 78.0 - 100.0 fL   MCH 32.6 26.0 - 34.0 pg   MCHC 33.6 30.0 - 36.0 g/dL   RDW 13.8 11.5 - 15.5 %   Platelets 255 150 - 400 K/uL  I-stat troponin, ED     Status: None   Collection Time: 01/14/15  4:45 PM  Result Value Ref Range   Troponin i, poc 0.01 0.00 - 0.08 ng/mL   Comment 3          I-stat  troponin, ED     Status: None   Collection Time: 01/14/15  9:15 PM  Result Value Ref Range   Troponin i, poc 0.01 0.00 - 0.08 ng/mL   Comment 3          Troponin I-serum (0, 3, 6 hours)     Status: None   Collection Time: 01/15/15  3:21 AM  Result Value Ref Range   Troponin I <0.03 <0.031 ng/mL  Troponin I-serum (0, 3, 6 hours)     Status: None   Collection Time: 01/15/15  7:45 AM  Result Value Ref Range   Troponin I <0.03 <0.031 ng/mL  Comprehensive metabolic panel     Status: Abnormal   Collection Time: 01/15/15  7:45 AM  Result Value Ref Range   Sodium 134 (L) 135 - 145 mmol/L   Potassium 3.7 3.5 - 5.1 mmol/L   Chloride 99 (L) 101 - 111 mmol/L   CO2 25 22 - 32 mmol/L   Glucose, Bld 90 65 - 99 mg/dL   BUN 11 6 - 20 mg/dL   Creatinine, Ser 1.10 0.61 - 1.24 mg/dL   Calcium 9.2 8.9 - 10.3 mg/dL   Total Protein 8.2 (H) 6.5 - 8.1 g/dL   Albumin 3.8 3.5 - 5.0 g/dL   AST 165 (H) 15 - 41 U/L   ALT 58 17 - 63 U/L   Alkaline Phosphatase 99 38 - 126 U/L   Total Bilirubin 1.2 0.3 - 1.2 mg/dL   GFR calc non Af Amer >60 >60 mL/min   GFR calc Af Amer >60 >60 mL/min   Anion gap 10 5 - 15  CBC     Status: None   Collection Time: 01/15/15  7:45 AM  Result Value Ref Range   WBC  4.0 4.0 - 10.5 K/uL   RBC 4.29 4.22 - 5.81 MIL/uL   Hemoglobin 13.9 13.0 - 17.0 g/dL   HCT 42.5 39.0 - 52.0 %   MCV 99.1 78.0 - 100.0 fL   MCH 32.4 26.0 - 34.0 pg   MCHC 32.7 30.0 - 36.0 g/dL   RDW 13.9 11.5 - 15.5 %   Platelets 312 150 - 400 K/uL  Lipase, blood     Status: None   Collection Time: 01/15/15  7:45 AM  Result Value Ref Range   Lipase 38 22 - 51 U/L    Signed: Collier Salina, MD 01/15/2015, 12:15 PM

## 2015-01-18 ENCOUNTER — Telehealth (HOSPITAL_COMMUNITY): Payer: Self-pay | Admitting: *Deleted

## 2015-01-18 NOTE — Telephone Encounter (Signed)
Patient given detailed instructions per Myocardial Perfusion Study Information Sheet for test on 01/20/15 at 0815. Patient notified to arrive 15 minutes early and that it is imperative to arrive on time for appointment to keep from having the test rescheduled.  If you need to cancel or reschedule your appointment, please call the office within 24 hours of your appointment. Failure to do so may result in a cancellation of your appointment, and a $50 no show fee. Patient verbalized understanding. Hasspacher, Ranae Palms

## 2015-01-20 ENCOUNTER — Ambulatory Visit (HOSPITAL_COMMUNITY): Payer: Self-pay | Attending: Student

## 2015-01-20 DIAGNOSIS — R079 Chest pain, unspecified: Secondary | ICD-10-CM

## 2015-01-20 DIAGNOSIS — I10 Essential (primary) hypertension: Secondary | ICD-10-CM | POA: Insufficient documentation

## 2015-01-20 DIAGNOSIS — R0609 Other forms of dyspnea: Secondary | ICD-10-CM | POA: Insufficient documentation

## 2015-01-20 LAB — MYOCARDIAL PERFUSION IMAGING
CHL CUP NUCLEAR SRS: 3
CHL CUP RESTING HR STRESS: 73 {beats}/min
CSEPPHR: 100 {beats}/min
LVDIAVOL: 86 mL
LVSYSVOL: 34 mL
NUC STRESS TID: 1
RATE: 0.24
SDS: 3
SSS: 6

## 2015-01-20 MED ORDER — TECHNETIUM TC 99M SESTAMIBI GENERIC - CARDIOLITE
33.0000 | Freq: Once | INTRAVENOUS | Status: AC | PRN
  Administered 2015-01-20: 33 via INTRAVENOUS

## 2015-01-20 MED ORDER — TECHNETIUM TC 99M SESTAMIBI GENERIC - CARDIOLITE
10.8000 | Freq: Once | INTRAVENOUS | Status: AC | PRN
  Administered 2015-01-20: 11 via INTRAVENOUS

## 2015-01-20 MED ORDER — REGADENOSON 0.4 MG/5ML IV SOLN
0.4000 mg | Freq: Once | INTRAVENOUS | Status: AC
  Administered 2015-01-20: 0.4 mg via INTRAVENOUS

## 2015-01-28 ENCOUNTER — Encounter: Payer: Self-pay | Admitting: Internal Medicine

## 2015-01-28 ENCOUNTER — Ambulatory Visit: Payer: Self-pay | Admitting: Internal Medicine

## 2015-01-31 ENCOUNTER — Encounter: Payer: Self-pay | Admitting: Physician Assistant
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
## 2015-02-01 ENCOUNTER — Encounter: Payer: Self-pay | Admitting: Physician Assistant

## 2015-02-01 NOTE — Progress Notes (Signed)
This encounter was created in error - please disregard.
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
## 2015-02-17 ENCOUNTER — Encounter: Payer: Self-pay | Admitting: Physician Assistant

## 2015-02-17 NOTE — Progress Notes (Signed)
This encounter was created in error - please disregard.

## 2015-03-02 ENCOUNTER — Encounter: Payer: Self-pay | Admitting: Physician Assistant

## 2015-06-21 ENCOUNTER — Encounter: Payer: Self-pay | Admitting: Internal Medicine

## 2015-06-21 ENCOUNTER — Ambulatory Visit (INDEPENDENT_AMBULATORY_CARE_PROVIDER_SITE_OTHER): Payer: Self-pay | Admitting: Internal Medicine

## 2015-06-21 ENCOUNTER — Other Ambulatory Visit: Payer: Self-pay | Admitting: *Deleted

## 2015-06-21 VITALS — BP 123/84 | HR 103 | Temp 98.0°F | Ht 70.0 in | Wt 150.9 lb

## 2015-06-21 DIAGNOSIS — K219 Gastro-esophageal reflux disease without esophagitis: Secondary | ICD-10-CM

## 2015-06-21 DIAGNOSIS — I10 Essential (primary) hypertension: Secondary | ICD-10-CM

## 2015-06-21 DIAGNOSIS — K2100 Gastro-esophageal reflux disease with esophagitis, without bleeding: Secondary | ICD-10-CM | POA: Insufficient documentation

## 2015-06-21 DIAGNOSIS — M25531 Pain in right wrist: Secondary | ICD-10-CM

## 2015-06-21 DIAGNOSIS — E785 Hyperlipidemia, unspecified: Secondary | ICD-10-CM

## 2015-06-21 DIAGNOSIS — M1A0210 Idiopathic chronic gout, right elbow, without tophus (tophi): Secondary | ICD-10-CM

## 2015-06-21 DIAGNOSIS — R0789 Other chest pain: Secondary | ICD-10-CM

## 2015-06-21 DIAGNOSIS — M25532 Pain in left wrist: Secondary | ICD-10-CM

## 2015-06-21 DIAGNOSIS — R740 Nonspecific elevation of levels of transaminase and lactic acid dehydrogenase [LDH]: Secondary | ICD-10-CM

## 2015-06-21 DIAGNOSIS — K644 Residual hemorrhoidal skin tags: Secondary | ICD-10-CM

## 2015-06-21 DIAGNOSIS — Z0000 Encounter for general adult medical examination without abnormal findings: Secondary | ICD-10-CM

## 2015-06-21 DIAGNOSIS — R7401 Elevation of levels of liver transaminase levels: Secondary | ICD-10-CM

## 2015-06-21 MED ORDER — PANTOPRAZOLE SODIUM 40 MG PO TBEC: 40.0000 mg | DELAYED_RELEASE_TABLET | Freq: Every day | ORAL | Status: DC

## 2015-06-21 MED ORDER — ATENOLOL 100 MG PO TABS: 100.0000 mg | ORAL_TABLET | Freq: Every day | ORAL | Status: DC

## 2015-06-21 MED ORDER — ALLOPURINOL 300 MG PO TABS: 300.0000 mg | ORAL_TABLET | Freq: Every day | ORAL | Status: DC

## 2015-06-21 MED ORDER — ATORVASTATIN CALCIUM 40 MG PO TABS: 40.0000 mg | ORAL_TABLET | Freq: Every day | ORAL | Status: DC

## 2015-06-21 MED ORDER — IBUPROFEN 200 MG PO TABS: 200.0000 mg | ORAL_TABLET | Freq: Four times a day (QID) | ORAL | Status: DC | PRN

## 2015-06-21 MED ORDER — ASPIRIN EC 81 MG PO TBEC: 81.0000 mg | DELAYED_RELEASE_TABLET | Freq: Every day | ORAL | Status: DC

## 2015-06-21 MED ORDER — HYDROCORTISONE 2.5 % RE CREA: 1.0000 "application " | TOPICAL_CREAM | Freq: Two times a day (BID) | RECTAL | Status: DC

## 2015-06-21 NOTE — Telephone Encounter (Signed)
Pt skips from one subject to another, states he has chest funniness for 1 week because he ran out of medicine, appt today w/ dr Heber [place]

## 2015-06-21 NOTE — Patient Instructions (Signed)
1. Please make a follow up appointment for 3 months.   2. Please take all medications as previously prescribed with the following changes:  Restart Atenolol 100 mg daily, Aspirin 81 mg daily, Lipitor, and Protonix.  Take over the counter Tylenol or ibuprofen for left wrist pain.   3. If you have worsening of your symptoms or new symptoms arise, please call the clinic [phone] or go to the ER immediately if symptoms are severe.
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
## 2015-06-21 NOTE — Telephone Encounter (Signed)
I will let Dr. Heber [place]-Sanders and/or Dr. Ronnald Ramp' assess after appointment as they are both here.  Thank you!

## 2015-06-22 ENCOUNTER — Encounter: Payer: Self-pay | Admitting: Licensed Clinical Social Worker

## 2015-06-22 DIAGNOSIS — M25532 Pain in left wrist: Secondary | ICD-10-CM | POA: Insufficient documentation

## 2015-06-22 LAB — HIV ANTIBODY (ROUTINE TESTING W REFLEX): HIV Screen 4th Generation wRfx: NONREACTIVE

## 2015-06-22 LAB — CMP14 + ANION GAP
ALBUMIN: 4.4 g/dL (ref 3.5–5.5)
ALT: 40 IU/L (ref 0–44)
AST: 90 IU/L — ABNORMAL HIGH (ref 0–40)
Albumin/Globulin Ratio: 1.3 (ref 1.1–2.5)
Alkaline Phosphatase: 105 IU/L (ref 39–117)
Anion Gap: 22 mmol/L — ABNORMAL HIGH (ref 10.0–18.0)
BILIRUBIN TOTAL: 0.6 mg/dL (ref 0.0–1.2)
BUN / CREAT RATIO: 12 (ref 9–20)
BUN: 12 mg/dL (ref 6–24)
CALCIUM: 9.4 mg/dL (ref 8.7–10.2)
CHLORIDE: 101 mmol/L (ref 96–106)
CO2: 17 mmol/L — AB (ref 18–29)
CREATININE: 1 mg/dL (ref 0.76–1.27)
GFR, EST AFRICAN AMERICAN: 98 mL/min/{1.73_m2} (ref >59)
GFR, EST NON AFRICAN AMERICAN: 85 mL/min/{1.73_m2} (ref >59)
Globulin, Total: 3.4 g/dL (ref 1.5–4.5)
Glucose: 66 mg/dL (ref 65–99)
Potassium: 4.3 mmol/L (ref 3.5–5.2)
Sodium: 140 mmol/L (ref 134–144)
TOTAL PROTEIN: 7.8 g/dL (ref 6.0–8.5)

## 2015-06-22 LAB — HEPATITIS C ANTIBODY: Hep C Virus Ab: <0.1 s/co ratio (ref 0.0–0.9)

## 2015-06-22 MED ORDER — NITROGLYCERIN 0.4 MG SL SUBL: 0.4000 mg | SUBLINGUAL_TABLET | SUBLINGUAL | Status: DC | PRN

## 2015-06-22 NOTE — Assessment & Plan Note (Signed)
BP Readings from Last 3 Encounters:  06/21/15 123/84  01/15/15 152/104  01/12/15 133/93    Lab Results  Component Value Date   NA 140 06/21/2015   K 4.3 06/21/2015   CREATININE 1.00 06/21/2015    Assessment: Blood pressure control:  Controlled currently Progress toward BP goal:  at goal Comments: Says he "thinks" he took his Atenolol today. Has been spreading his meds out for a few months, hasn't been taking them every day.   Plan: Medications:  continue current medications; Refill atenolol 100 mg daily Other plans: RTC in 3 months

## 2015-06-22 NOTE — Progress Notes (Signed)
Internal Medicine Clinic Attending  Case discussed with Dr. Jones at the time of the visit.  We reviewed the resident's history and exam and pertinent patient test results.  I agree with the assessment, diagnosis, and plan of care documented in the resident's note.  

## 2015-06-22 NOTE — Assessment & Plan Note (Signed)
Intermittently takes Allopurinol. Refilled this today. No active flare.

## 2015-06-22 NOTE — Assessment & Plan Note (Signed)
Small swelling overlying dorsum of the wrist. Mildly tender on exam. No increased warmth, no diffuse swelling. Do not suspect gout or septic joint. No Systemic symptoms. 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 Do not feel it is large enough to drain at this time. Explained this to the patient. May also have component of arthritis in the wrist as well.  -Rest, ice for pain, NSAIDS for mild pain.  -If worsening discomfort, can return for re-evaluation and possible drainage if appears to be larger ganglionic cyst.

## 2015-06-22 NOTE — Assessment & Plan Note (Signed)
Patient has chronic mild elevation in his AST. Previous RUQ [place] was performed in 2007 for this (elevated even then), read as a normal [place]. Repeat LFT's still show AST of 90. Hep C Ab pending. If negative, could consider repeat US vs further Chronic Hepatitis screening (ie: Hep B).

## 2015-06-22 NOTE — Assessment & Plan Note (Signed)
Flu shot given today. Patient has had long standing mild elevation in LFT's, will check Hep C Ab today, don't see that this has been checked in the past. HIV negative.

## 2015-06-22 NOTE — Assessment & Plan Note (Signed)
Refill protonix. Still describes some chest discomfort and burning type pain, suspect GERD is a contributor to this.  -Continue PPI

## 2015-06-22 NOTE — Assessment & Plan Note (Signed)
Unclear etiology, still complains of some mild chest discomfort. Some burning type pain, some sharp, has a difficult time describing it. Stress test in 12/2014 was interpreted as a low risk study. No longer uses cocaine, says he hasn't for years. Non-tender on exam.  -Continue ASA, NTG prn -Continue PPI -Continue Atenolol

## 2015-06-22 NOTE — Assessment & Plan Note (Signed)
Refilled Lipitor

## 2015-06-22 NOTE — Assessment & Plan Note (Signed)
Refill Anusol

## 2015-06-28 ENCOUNTER — Ambulatory Visit: Payer: Self-pay

## 2015-06-28 ENCOUNTER — Ambulatory Visit: Payer: Self-pay | Admitting: Internal Medicine

## 2015-07-01 ENCOUNTER — Emergency Department (HOSPITAL_COMMUNITY): Payer: MEDICAID

## 2015-07-01 ENCOUNTER — Emergency Department (HOSPITAL_COMMUNITY): Admit: 2015-07-01 | Discharge: 2015-07-01 | Disposition: A | Discharge status: Home / Self Care | Payer: MEDICAID

## 2015-07-01 ENCOUNTER — Emergency Department (HOSPITAL_COMMUNITY)
Admission: EM | Admit: 2015-07-01 | Discharge: 2015-07-01 | Disposition: A | Discharge status: Home / Self Care | Payer: Self-pay | Attending: Emergency Medicine | Admitting: Emergency Medicine

## 2015-07-01 ENCOUNTER — Encounter (HOSPITAL_COMMUNITY): Payer: Self-pay

## 2015-07-01 DIAGNOSIS — F17210 Nicotine dependence, cigarettes, uncomplicated: Secondary | ICD-10-CM | POA: Insufficient documentation

## 2015-07-01 DIAGNOSIS — Z79899 Other long term (current) drug therapy: Secondary | ICD-10-CM | POA: Insufficient documentation

## 2015-07-01 DIAGNOSIS — Z7982 Long term (current) use of aspirin: Secondary | ICD-10-CM | POA: Insufficient documentation

## 2015-07-01 DIAGNOSIS — E785 Hyperlipidemia, unspecified: Secondary | ICD-10-CM | POA: Insufficient documentation

## 2015-07-01 DIAGNOSIS — M109 Gout, unspecified: Secondary | ICD-10-CM

## 2015-07-01 DIAGNOSIS — G8929 Other chronic pain: Secondary | ICD-10-CM | POA: Insufficient documentation

## 2015-07-01 DIAGNOSIS — M10032 Idiopathic gout, left wrist: Secondary | ICD-10-CM | POA: Insufficient documentation

## 2015-07-01 DIAGNOSIS — Z590 Homelessness: Secondary | ICD-10-CM | POA: Insufficient documentation

## 2015-07-01 DIAGNOSIS — R079 Chest pain, unspecified: Secondary | ICD-10-CM | POA: Insufficient documentation

## 2015-07-01 DIAGNOSIS — I10 Essential (primary) hypertension: Secondary | ICD-10-CM | POA: Insufficient documentation

## 2015-07-01 DIAGNOSIS — I252 Old myocardial infarction: Secondary | ICD-10-CM | POA: Insufficient documentation

## 2015-07-01 LAB — CBC
HCT: 38.1 % — ABNORMAL LOW (ref 39.0–52.0)
Hemoglobin: 12.8 g/dL — ABNORMAL LOW (ref 13.0–17.0)
MCH: 33.1 pg (ref 26.0–34.0)
MCHC: 33.6 g/dL (ref 30.0–36.0)
MCV: 98.4 fL (ref 78.0–100.0)
Platelets: 258 10*3/uL (ref 150–400)
RBC: 3.87 MIL/uL — ABNORMAL LOW (ref 4.22–5.81)
RDW: 14.6 % (ref 11.5–15.5)
WBC: 9 10*3/uL (ref 4.0–10.5)

## 2015-07-01 LAB — COMPREHENSIVE METABOLIC PANEL
ALT: 45 U/L (ref 17–63)
ANION GAP: 13 (ref 5–15)
AST: 93 U/L — ABNORMAL HIGH (ref 15–41)
Albumin: 3.7 g/dL (ref 3.5–5.0)
Alkaline Phosphatase: 128 U/L — ABNORMAL HIGH (ref 38–126)
BUN: 15 mg/dL (ref 6–20)
CHLORIDE: 103 mmol/L (ref 101–111)
CO2: 23 mmol/L (ref 22–32)
Calcium: 9.9 mg/dL (ref 8.9–10.3)
Creatinine, Ser: 0.89 mg/dL (ref 0.61–1.24)
Glucose, Bld: 125 mg/dL — ABNORMAL HIGH (ref 65–99)
POTASSIUM: 3.6 mmol/L (ref 3.5–5.1)
Sodium: 139 mmol/L (ref 135–145)
Total Bilirubin: 0.3 mg/dL (ref 0.3–1.2)
Total Protein: 8 g/dL (ref 6.5–8.1)

## 2015-07-01 LAB — BRAIN NATRIURETIC PEPTIDE: B Natriuretic Peptide: 56.2 pg/mL (ref 0.0–100.0)

## 2015-07-01 LAB — LIPASE, BLOOD: LIPASE: 39 U/L (ref 11–51)

## 2015-07-01 LAB — I-STAT TROPONIN, ED: TROPONIN I, POC: 0.02 ng/mL (ref 0.00–0.08)

## 2015-07-01 MED ORDER — OXYCODONE-ACETAMINOPHEN 5-325 MG PO TABS
2.0000 | ORAL_TABLET | Freq: Once | ORAL | Status: AC
Start: 2015-07-01 — End: 2015-07-01
  Administered 2015-07-01: 2 via ORAL
  Filled 2015-07-01: qty 2

## 2015-07-01 MED ORDER — ONDANSETRON 4 MG PO TBDP
4.0000 mg | ORAL_TABLET | Freq: Once | ORAL | Status: AC
  Administered 2015-07-01: 4 mg via ORAL
  Filled 2015-07-01: qty 1

## 2015-07-01 MED ORDER — ASPIRIN 81 MG PO CHEW
324.0000 mg | CHEWABLE_TABLET | Freq: Once | ORAL | Status: AC
  Administered 2015-07-01: 324 mg via ORAL
  Filled 2015-07-01: qty 4

## 2015-07-01 MED ORDER — IBUPROFEN 400 MG PO TABS
600.0000 mg | ORAL_TABLET | Freq: Once | ORAL | Status: AC
  Administered 2015-07-01: 600 mg via ORAL
  Filled 2015-07-01: qty 1

## 2015-07-01 MED ORDER — PREDNISONE 10 MG PO TABS: 60.0000 mg | ORAL_TABLET | Freq: Every day | ORAL | Status: DC

## 2015-07-01 MED ORDER — IBUPROFEN 600 MG PO TABS: 600.0000 mg | ORAL_TABLET | Freq: Three times a day (TID) | ORAL | Status: DC | PRN

## 2015-07-01 NOTE — ED Notes (Signed)
Patient here with left wrist pain and mild swelling x 2 days, denies injury. Hx of gout

## 2015-07-01 NOTE — ED Notes (Signed)
Pt ambulating independently w/ steady gait on d/c in no acute distress, A&Ox4. D/c instructions reviewed w/ pt - pt denies any further questions or concerns at present. Rx given x1  

## 2015-07-01 NOTE — Discharge Instructions (Signed)

## 2015-07-01 NOTE — ED Notes (Signed)
PT placed in room T6 on assessment PT continued to reports CP with [facility] . Vital signs repeated and EKG located in Triage . Labs ordered.

## 2015-07-01 NOTE — ED Notes (Addendum)
Pt presents to nures first stating he has chest pain. EKG preformed.

## 2015-07-01 NOTE — ED Provider Notes (Signed)
Patient is a 55 year old male, presented via EMS to the emergency department for evaluation of left wrist pain and swelling, with history of gout. He was placed in the lobby and at 857 presented to the nursing station complaining of chest pain where an EKG was performed. This was reviewed by myself and Dr. Maryan Rued, no EKG changes from prior, no ST elevation or depression.  The patient was placed in fast track where he continues to complain of chest pain, dyspnea and wrist pain.  He is hypertensive with blood pressure 182/114. He does have a history of gout, hypertension, hyperlipidemia, current smoker and polysubstance abuse. He has a history of chest pain and reported history of "old MI."   He is ambulatory, alert, mildly diaphoretic, BS CTA A&P, Heart RRR with palpable pulses, no LE edema, abdomen diffusely tender.   Feel patient necessitates greater workup  For CP and SOB complaints, Charge RN notified, pt will be transferred to acute bed elsewhere in ER when available.  MSE was initiated and I personally evaluated the patient and placed orders (if any) at  9:21 AM on July 01, 2015.  The patient appears stable so that the remainder of the MSE may be completed by another provider.  Delsa Grana, PA-C 07/10/15 2308  Blanchie Dessert, MD 07/11/15 [phone]

## 2015-07-01 NOTE — ED Notes (Signed)
Patient came to nurse first and now complaining of chest pain.   Taking patient back to triage for EKG and reevaluation.
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
## 2015-07-06 ENCOUNTER — Telehealth: Payer: Self-pay | Admitting: Internal Medicine

## 2015-07-06 NOTE — Telephone Encounter (Signed)
APPT. REMINDER CALL, LMTCB °

## 2015-07-07 ENCOUNTER — Ambulatory Visit: Payer: Self-pay

## 2015-10-11 ENCOUNTER — Encounter: Payer: Self-pay | Admitting: *Deleted

## 2016-05-09 ENCOUNTER — Encounter (HOSPITAL_COMMUNITY): Payer: Self-pay

## 2016-05-09 DIAGNOSIS — M109 Gout, unspecified: Secondary | ICD-10-CM | POA: Diagnosis present

## 2016-05-09 DIAGNOSIS — Z8042 Family history of malignant neoplasm of prostate: Secondary | ICD-10-CM

## 2016-05-09 DIAGNOSIS — I10 Essential (primary) hypertension: Secondary | ICD-10-CM | POA: Diagnosis present

## 2016-05-09 DIAGNOSIS — R740 Nonspecific elevation of levels of transaminase and lactic acid dehydrogenase [LDH]: Secondary | ICD-10-CM | POA: Diagnosis present

## 2016-05-09 DIAGNOSIS — F1010 Alcohol abuse, uncomplicated: Secondary | ICD-10-CM | POA: Diagnosis present

## 2016-05-09 DIAGNOSIS — K449 Diaphragmatic hernia without obstruction or gangrene: Secondary | ICD-10-CM | POA: Diagnosis present

## 2016-05-09 DIAGNOSIS — Z803 Family history of malignant neoplasm of breast: Secondary | ICD-10-CM

## 2016-05-09 DIAGNOSIS — F17210 Nicotine dependence, cigarettes, uncomplicated: Secondary | ICD-10-CM | POA: Diagnosis present

## 2016-05-09 DIAGNOSIS — Z833 Family history of diabetes mellitus: Secondary | ICD-10-CM

## 2016-05-09 DIAGNOSIS — K7010 Alcoholic hepatitis without ascites: Secondary | ICD-10-CM | POA: Diagnosis present

## 2016-05-09 DIAGNOSIS — M549 Dorsalgia, unspecified: Secondary | ICD-10-CM | POA: Diagnosis present

## 2016-05-09 DIAGNOSIS — R1310 Dysphagia, unspecified: Secondary | ICD-10-CM | POA: Diagnosis present

## 2016-05-09 DIAGNOSIS — E785 Hyperlipidemia, unspecified: Secondary | ICD-10-CM | POA: Diagnosis present

## 2016-05-09 DIAGNOSIS — E871 Hypo-osmolality and hyponatremia: Secondary | ICD-10-CM | POA: Diagnosis present

## 2016-05-09 DIAGNOSIS — Z590 Homelessness: Secondary | ICD-10-CM

## 2016-05-09 DIAGNOSIS — I252 Old myocardial infarction: Secondary | ICD-10-CM

## 2016-05-09 DIAGNOSIS — Z7982 Long term (current) use of aspirin: Secondary | ICD-10-CM

## 2016-05-09 DIAGNOSIS — E872 Acidosis: Secondary | ICD-10-CM | POA: Diagnosis present

## 2016-05-09 DIAGNOSIS — G894 Chronic pain syndrome: Secondary | ICD-10-CM | POA: Diagnosis present

## 2016-05-09 DIAGNOSIS — I2510 Atherosclerotic heart disease of native coronary artery without angina pectoris: Secondary | ICD-10-CM | POA: Diagnosis present

## 2016-05-09 DIAGNOSIS — K210 Gastro-esophageal reflux disease with esophagitis: Principal | ICD-10-CM | POA: Diagnosis present

## 2016-05-09 LAB — BASIC METABOLIC PANEL
ANION GAP: 16 — AB (ref 5–15)
BUN: 14 mg/dL (ref 6–20)
CALCIUM: 9.5 mg/dL (ref 8.9–10.3)
CO2: 21 mmol/L — AB (ref 22–32)
CREATININE: 1.04 mg/dL (ref 0.61–1.24)
Chloride: 90 mmol/L — ABNORMAL LOW (ref 101–111)
GFR calc Af Amer: >60 mL/min (ref >60)
GFR calc non Af Amer: >60 mL/min (ref >60)
GLUCOSE: 109 mg/dL — AB (ref 65–99)
Potassium: 3.9 mmol/L (ref 3.5–5.1)
Sodium: 127 mmol/L — ABNORMAL LOW (ref 135–145)

## 2016-05-09 LAB — CBC
HCT: 37.3 % — ABNORMAL LOW (ref 39.0–52.0)
HEMOGLOBIN: 13.1 g/dL (ref 13.0–17.0)
MCH: 33.2 pg (ref 26.0–34.0)
MCHC: 35.1 g/dL (ref 30.0–36.0)
MCV: 94.7 fL (ref 78.0–100.0)
Platelets: 343 10*3/uL (ref 150–400)
RBC: 3.94 MIL/uL — ABNORMAL LOW (ref 4.22–5.81)
RDW: 13.1 % (ref 11.5–15.5)
WBC: 7.8 10*3/uL (ref 4.0–10.5)

## 2016-05-09 LAB — I-STAT TROPONIN, ED: TROPONIN I, POC: 0 ng/mL (ref 0.00–0.08)

## 2016-05-09 NOTE — ED Triage Notes (Signed)
Pt endorses chest pain, heart burn and abdominal pain x 3 days. Pt has hx of MI in 2003. Pt tachycardic in triage in 120s.

## 2016-05-10 ENCOUNTER — Emergency Department (HOSPITAL_COMMUNITY): Payer: Self-pay

## 2016-05-10 ENCOUNTER — Encounter (HOSPITAL_COMMUNITY): Payer: Self-pay

## 2016-05-10 ENCOUNTER — Inpatient Hospital Stay (HOSPITAL_COMMUNITY)
Admission: EM | Admit: 2016-05-10 | Discharge: 2016-05-12 | DRG: 392 | Disposition: A | Discharge status: Home / Self Care | Payer: Self-pay | Attending: Oncology | Admitting: Oncology

## 2016-05-10 DIAGNOSIS — K2100 Gastro-esophageal reflux disease with esophagitis, without bleeding: Secondary | ICD-10-CM | POA: Diagnosis present

## 2016-05-10 DIAGNOSIS — R000 Tachycardia, unspecified: Secondary | ICD-10-CM

## 2016-05-10 DIAGNOSIS — R195 Other fecal abnormalities: Secondary | ICD-10-CM

## 2016-05-10 DIAGNOSIS — I517 Cardiomegaly: Secondary | ICD-10-CM

## 2016-05-10 DIAGNOSIS — I10 Essential (primary) hypertension: Secondary | ICD-10-CM

## 2016-05-10 DIAGNOSIS — M549 Dorsalgia, unspecified: Secondary | ICD-10-CM

## 2016-05-10 DIAGNOSIS — Z590 Homelessness: Secondary | ICD-10-CM

## 2016-05-10 DIAGNOSIS — K219 Gastro-esophageal reflux disease without esophagitis: Secondary | ICD-10-CM | POA: Diagnosis present

## 2016-05-10 DIAGNOSIS — G8921 Chronic pain due to trauma: Secondary | ICD-10-CM

## 2016-05-10 DIAGNOSIS — Z8042 Family history of malignant neoplasm of prostate: Secondary | ICD-10-CM

## 2016-05-10 DIAGNOSIS — F1010 Alcohol abuse, uncomplicated: Secondary | ICD-10-CM

## 2016-05-10 DIAGNOSIS — R740 Nonspecific elevation of levels of transaminase and lactic acid dehydrogenase [LDH]: Secondary | ICD-10-CM

## 2016-05-10 DIAGNOSIS — Z7982 Long term (current) use of aspirin: Secondary | ICD-10-CM

## 2016-05-10 DIAGNOSIS — E872 Acidosis: Secondary | ICD-10-CM

## 2016-05-10 DIAGNOSIS — K229 Disease of esophagus, unspecified: Secondary | ICD-10-CM

## 2016-05-10 DIAGNOSIS — Z8781 Personal history of (healed) traumatic fracture: Secondary | ICD-10-CM

## 2016-05-10 DIAGNOSIS — Z720 Tobacco use: Secondary | ICD-10-CM | POA: Diagnosis present

## 2016-05-10 DIAGNOSIS — K649 Unspecified hemorrhoids: Secondary | ICD-10-CM

## 2016-05-10 DIAGNOSIS — I119 Hypertensive heart disease without heart failure: Secondary | ICD-10-CM | POA: Diagnosis present

## 2016-05-10 DIAGNOSIS — F1020 Alcohol dependence, uncomplicated: Secondary | ICD-10-CM | POA: Diagnosis present

## 2016-05-10 DIAGNOSIS — Z803 Family history of malignant neoplasm of breast: Secondary | ICD-10-CM

## 2016-05-10 DIAGNOSIS — I252 Old myocardial infarction: Secondary | ICD-10-CM | POA: Diagnosis present

## 2016-05-10 DIAGNOSIS — R7401 Elevation of levels of liver transaminase levels: Secondary | ICD-10-CM | POA: Diagnosis present

## 2016-05-10 DIAGNOSIS — K209 Esophagitis, unspecified without bleeding: Secondary | ICD-10-CM | POA: Diagnosis present

## 2016-05-10 DIAGNOSIS — F17210 Nicotine dependence, cigarettes, uncomplicated: Secondary | ICD-10-CM

## 2016-05-10 DIAGNOSIS — I43 Cardiomyopathy in diseases classified elsewhere: Secondary | ICD-10-CM

## 2016-05-10 HISTORY — DX: Gastro-esophageal reflux disease without esophagitis: K21.9

## 2016-05-10 HISTORY — DX: Esophagitis, unspecified without bleeding: K20.90

## 2016-05-10 HISTORY — DX: Esophagitis, unspecified: K20.9

## 2016-05-10 LAB — LIPASE, BLOOD: Lipase: 32 U/L (ref 11–51)

## 2016-05-10 LAB — COMPREHENSIVE METABOLIC PANEL
ALBUMIN: 3.2 g/dL — AB (ref 3.5–5.0)
ALK PHOS: 86 U/L (ref 38–126)
ALT: 90 U/L — AB (ref 17–63)
ANION GAP: 9 (ref 5–15)
AST: 133 U/L — AB (ref 15–41)
BUN: 8 mg/dL (ref 6–20)
CALCIUM: 9 mg/dL (ref 8.9–10.3)
CO2: 26 mmol/L (ref 22–32)
CREATININE: 0.87 mg/dL (ref 0.61–1.24)
Chloride: 98 mmol/L — ABNORMAL LOW (ref 101–111)
GFR calc Af Amer: >60 mL/min (ref >60)
GFR calc non Af Amer: >60 mL/min (ref >60)
GLUCOSE: 153 mg/dL — AB (ref 65–99)
Potassium: 4 mmol/L (ref 3.5–5.1)
Sodium: 133 mmol/L — ABNORMAL LOW (ref 135–145)
Total Bilirubin: 1.4 mg/dL — ABNORMAL HIGH (ref 0.3–1.2)
Total Protein: 7 g/dL (ref 6.5–8.1)

## 2016-05-10 LAB — HEPATIC FUNCTION PANEL
ALBUMIN: 3.9 g/dL (ref 3.5–5.0)
ALK PHOS: 95 U/L (ref 38–126)
ALT: 99 U/L — AB (ref 17–63)
AST: 176 U/L — AB (ref 15–41)
BILIRUBIN TOTAL: 0.6 mg/dL (ref 0.3–1.2)
Bilirubin, Direct: <0.1 mg/dL — ABNORMAL LOW (ref 0.1–0.5)
TOTAL PROTEIN: 8.4 g/dL — AB (ref 6.5–8.1)

## 2016-05-10 LAB — I-STAT TROPONIN, ED: TROPONIN I, POC: 0.01 ng/mL (ref 0.00–0.08)

## 2016-05-10 LAB — PROTIME-INR
INR: 1.13
Prothrombin Time: 14.6 seconds (ref 11.4–15.2)

## 2016-05-10 LAB — RAPID URINE DRUG SCREEN, HOSP PERFORMED
AMPHETAMINES: NOT DETECTED
BARBITURATES: NOT DETECTED
BENZODIAZEPINES: NOT DETECTED
COCAINE: NOT DETECTED
Opiates: POSITIVE — AB
Tetrahydrocannabinol: POSITIVE — AB

## 2016-05-10 LAB — MAGNESIUM: Magnesium: 2.2 mg/dL (ref 1.7–2.4)

## 2016-05-10 MED ORDER — ENOXAPARIN SODIUM 40 MG/0.4ML ~~LOC~~ SOLN
40.0000 mg | SUBCUTANEOUS | Status: DC
  Administered 2016-05-10 – 2016-05-11 (×2): 40 mg via SUBCUTANEOUS
  Filled 2016-05-10 (×2): qty 0.4

## 2016-05-10 MED ORDER — ATENOLOL 50 MG PO TABS
100.0000 mg | ORAL_TABLET | Freq: Every day | ORAL | Status: DC
  Administered 2016-05-10 – 2016-05-12 (×3): 100 mg via ORAL
  Filled 2016-05-10 (×2): qty 2
  Filled 2016-05-10: qty 4

## 2016-05-10 MED ORDER — SODIUM CHLORIDE 0.9 % IV BOLUS (SEPSIS)
1000.0000 mL | Freq: Once | INTRAVENOUS | Status: AC
  Administered 2016-05-10: 1000 mL via INTRAVENOUS

## 2016-05-10 MED ORDER — IOPAMIDOL (ISOVUE-300) INJECTION 61%
INTRAVENOUS | Status: AC
  Administered 2016-05-10: 100 mL
  Filled 2016-05-10: qty 100

## 2016-05-10 MED ORDER — PNEUMOCOCCAL VAC POLYVALENT 25 MCG/0.5ML IJ INJ
0.5000 mL | INJECTION | INTRAMUSCULAR | Status: AC
  Administered 2016-05-12: 0.5 mL via INTRAMUSCULAR
  Filled 2016-05-10: qty 0.5

## 2016-05-10 MED ORDER — KCL IN DEXTROSE-NACL 40-5-0.9 MEQ/L-%-% IV SOLN
INTRAVENOUS | Status: AC
  Administered 2016-05-10 – 2016-05-11 (×3): via INTRAVENOUS
  Filled 2016-05-10 (×4): qty 1000

## 2016-05-10 MED ORDER — PANTOPRAZOLE SODIUM 40 MG IV SOLR
40.0000 mg | Freq: Once | INTRAVENOUS | Status: AC
  Administered 2016-05-10: 40 mg via INTRAVENOUS
  Filled 2016-05-10: qty 40

## 2016-05-10 MED ORDER — MORPHINE SULFATE (PF) 4 MG/ML IV SOLN
4.0000 mg | Freq: Once | INTRAVENOUS | Status: AC
  Administered 2016-05-10: 4 mg via INTRAVENOUS
  Filled 2016-05-10: qty 1

## 2016-05-10 MED ORDER — INFLUENZA VAC SPLIT QUAD 0.5 ML IM SUSY
0.5000 mL | PREFILLED_SYRINGE | INTRAMUSCULAR | Status: AC
  Administered 2016-05-12: 0.5 mL via INTRAMUSCULAR
  Filled 2016-05-10: qty 0.5

## 2016-05-10 MED ORDER — ONDANSETRON HCL 4 MG/2ML IJ SOLN
4.0000 mg | Freq: Once | INTRAMUSCULAR | Status: AC
  Administered 2016-05-10: 4 mg via INTRAVENOUS
  Filled 2016-05-10: qty 2

## 2016-05-10 MED ORDER — HYDRALAZINE HCL 20 MG/ML IJ SOLN
5.0000 mg | Freq: Once | INTRAMUSCULAR | Status: AC
  Administered 2016-05-10: 5 mg via INTRAVENOUS
  Filled 2016-05-10: qty 1

## 2016-05-10 MED ORDER — MORPHINE SULFATE (PF) 2 MG/ML IV SOLN
2.0000 mg | INTRAVENOUS | Status: DC | PRN
  Administered 2016-05-10 – 2016-05-12 (×8): 2 mg via INTRAVENOUS
  Filled 2016-05-10 (×8): qty 1

## 2016-05-10 MED ORDER — GI COCKTAIL ~~LOC~~
30.0000 mL | Freq: Once | ORAL | Status: AC
  Administered 2016-05-10: 30 mL via ORAL
  Filled 2016-05-10: qty 30

## 2016-05-10 NOTE — Care Management Note (Signed)
Case Management Note  Patient Details  Name: [name] MRN: [id number] Date of Birth: 1961-01-16  Subjective/Objective:                    Action/Plan:  Received consult for homeless, SNF placement , home health needs, and medication assistance.  Can provide [place] letter for medication assistance at discharge and [facility] information if patient not to be followed at [employer] [facility]. SW consult for homeless  Expected Discharge Date:  05/12/16               Expected Discharge Plan:  Home/Self Care  In-House Referral:  NA, Clinical Social Work  Discharge planning Services  CM Consult, [facility], Medication Assistance  Post Acute Care Choice:    Choice offered to:     DME Arranged:    DME Agency:     HH Arranged:    [place] Agency:     Status of Service:  In process, will continue to follow  If discussed at Long Length of Stay Meetings, dates discussed:    Additional Comments:  Marilu Favre, RN 05/10/2016, 2:58 PM

## 2016-05-10 NOTE — ED Notes (Signed)
Patient transported to CT 

## 2016-05-10 NOTE — ED Notes (Addendum)
After drinking juice, pt reports pain to throat and burning in stomach.

## 2016-05-10 NOTE — Care Management Note (Signed)
Case Management Note  Patient Details  Name: Devin [name] MRN: [id number] Date of Birth: 02-08-1961  Subjective/Objective:                  From home alone. /55 y.o. male with a h/o prior MI in 2003, GERD, substance abuse, HLD, and HTN, who presents to the Emergency Department complaining of constant, centralized chest pain onset three days ago.   Action/Plan: Follow for disposition needs. / Admit to OBSERVATION; anticipate discharge [place].  Expected Discharge Date:  05/12/16               Expected Discharge Plan:  Home/Self Care  In-House Referral:  NA  Discharge planning Services  CM Consult, [facility], Medication Assistance   Status of Service:  In process, will continue to follow  If discussed at Long Length of Stay Meetings, dates discussed:    Additional Comments:  Fuller Mandril, RN 05/10/2016, 9:25 AM
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
## 2016-05-11 ENCOUNTER — Encounter (HOSPITAL_COMMUNITY)
Admission: EM | Disposition: A | Discharge status: Home / Self Care | Payer: Self-pay | Source: Home / Self Care | Attending: Oncology

## 2016-05-11 ENCOUNTER — Encounter (HOSPITAL_COMMUNITY): Payer: Self-pay | Admitting: *Deleted

## 2016-05-11 DIAGNOSIS — K209 Esophagitis, unspecified without bleeding: Secondary | ICD-10-CM | POA: Diagnosis present

## 2016-05-11 HISTORY — PX: ESOPHAGOGASTRODUODENOSCOPY: SHX5428

## 2016-05-11 LAB — COMPREHENSIVE METABOLIC PANEL
ALBUMIN: 3 g/dL — AB (ref 3.5–5.0)
ALT: 86 U/L — ABNORMAL HIGH (ref 17–63)
AST: 121 U/L — AB (ref 15–41)
Alkaline Phosphatase: 78 U/L (ref 38–126)
Anion gap: 8 (ref 5–15)
BUN: 5 mg/dL — AB (ref 6–20)
CO2: 26 mmol/L (ref 22–32)
Calcium: 9.2 mg/dL (ref 8.9–10.3)
Chloride: 102 mmol/L (ref 101–111)
Creatinine, Ser: 0.85 mg/dL (ref 0.61–1.24)
GFR calc Af Amer: >60 mL/min (ref >60)
GFR calc non Af Amer: >60 mL/min (ref >60)
GLUCOSE: 120 mg/dL — AB (ref 65–99)
POTASSIUM: 4.6 mmol/L (ref 3.5–5.1)
SODIUM: 136 mmol/L (ref 135–145)
Total Bilirubin: 1.2 mg/dL (ref 0.3–1.2)
Total Protein: 6.7 g/dL (ref 6.5–8.1)

## 2016-05-11 LAB — HEPATITIS PANEL, ACUTE
HCV Ab: <0.1 s/co ratio (ref 0.0–0.9)
HEP B S AG: NEGATIVE
Hep A IgM: NEGATIVE
Hep B C IgM: NEGATIVE

## 2016-05-11 LAB — HIV ANTIBODY (ROUTINE TESTING W REFLEX): HIV SCREEN 4TH GENERATION: NONREACTIVE

## 2016-05-11 SURGERY — EGD (ESOPHAGOGASTRODUODENOSCOPY)
Anesthesia: Moderate Sedation

## 2016-05-11 MED ORDER — MIDAZOLAM HCL 10 MG/2ML IJ SOLN
INTRAMUSCULAR | Status: DC | PRN
  Administered 2016-05-11 (×2): 2 mg via INTRAVENOUS

## 2016-05-11 MED ORDER — PANTOPRAZOLE SODIUM 40 MG IV SOLR
40.0000 mg | Freq: Two times a day (BID) | INTRAVENOUS | Status: DC
  Administered 2016-05-11 – 2016-05-12 (×3): 40 mg via INTRAVENOUS
  Filled 2016-05-11 (×4): qty 40

## 2016-05-11 MED ORDER — FENTANYL CITRATE (PF) 100 MCG/2ML IJ SOLN
INTRAMUSCULAR | Status: AC
  Filled 2016-05-11: qty 2

## 2016-05-11 MED ORDER — DEXTROSE-NACL 5-0.9 % IV SOLN: INTRAVENOUS | Status: DC

## 2016-05-11 MED ORDER — DIPHENHYDRAMINE HCL 50 MG/ML IJ SOLN
INTRAMUSCULAR | Status: AC
  Filled 2016-05-11: qty 1

## 2016-05-11 MED ORDER — DEXTROSE-NACL 5-0.9 % IV SOLN
INTRAVENOUS | Status: AC
  Administered 2016-05-11 – 2016-05-12 (×2): via INTRAVENOUS

## 2016-05-11 MED ORDER — FENTANYL CITRATE (PF) 100 MCG/2ML IJ SOLN
INTRAMUSCULAR | Status: DC | PRN
  Administered 2016-05-11 (×2): 25 ug via INTRAVENOUS

## 2016-05-11 MED ORDER — SODIUM CHLORIDE 0.9 % IV SOLN
INTRAVENOUS | Status: DC
  Administered 2016-05-11: 500 mL via INTRAVENOUS

## 2016-05-11 MED ORDER — MIDAZOLAM HCL 5 MG/ML IJ SOLN
INTRAMUSCULAR | Status: AC
  Filled 2016-05-11: qty 2

## 2016-05-11 NOTE — Op Note (Addendum)
[facility] Patient Name: Devin [name] Procedure Date : 05/11/2016 MRN: [id number] Attending MD: Carol Ada , MD Date of Birth: 30-May-1960 CSN: [id number] Age: 56 Admit Type: Inpatient Procedure:                Upper GI endoscopy Indications:              Abnormal CT of the GI tract Providers:                Carol Ada, MD, Elna Breslow, RN, William Dalton, Technician Referring MD:              Medicines:                Midazolam 4 mg IV, Fentanyl 50 micrograms IV Complications:            No immediate complications. Estimated Blood Loss:     Estimated blood loss: none. Procedure:                Pre-Anesthesia Assessment:                           - Prior to the procedure, a History and Physical                            was performed, and patient medications and                            allergies were reviewed. The patient's tolerance of                            previous anesthesia was also reviewed. The risks                            and benefits of the procedure and the sedation                            options and risks were discussed with the patient.                            All questions were answered, and informed consent                            was obtained. Prior Anticoagulants: The patient has                            taken no previous anticoagulant or antiplatelet                            agents. ASA Grade Assessment: II - A patient with                            mild systemic disease. After reviewing the risks  and benefits, the patient was deemed in                            satisfactory condition to undergo the procedure.                           - Sedation was administered by an endoscopy nurse.                            The sedation level attained was moderate.                           After obtaining informed consent, the endoscope was                            passed  under direct vision. Throughout the                            procedure, the patient's blood pressure, pulse, and                            oxygen saturations were monitored continuously. The                            Endoscope was introduced through the mouth, and                            advanced to the second part of duodenum. The upper                            GI endoscopy was accomplished without difficulty.                            The patient tolerated the procedure well. Scope In: Scope Out: Findings:      LA Grade D (one or more mucosal breaks involving at least 75% of       esophageal circumference) esophagitis with no bleeding was found.      A 3 cm hiatal hernia was present.      The stomach was normal.      The examined duodenum was normal. Impression:               - LA Grade D reflux esophagitis.                           - 3 cm hiatal hernia.                           - Normal stomach.                           - Normal examined duodenum.                           - No specimens collected. Moderate Sedation:      Moderate (conscious) sedation was administered by the  endoscopy nurse       and supervised by the endoscopist. The following parameters were       monitored: oxygen saturation, heart rate, blood pressure, and response       to care. Recommendation:           - Return patient to hospital ward for ongoing care.                           - Resume regular diet.                           - Continue present medications.                           - PPI BID x 1 month and then QD indefinitely.                           - Signing off. Patient can follow up with PCP. Procedure Code(s):        --- Professional ---                           [phone], Esophagogastroduodenoscopy, flexible,                            transoral; diagnostic, including collection of                            specimen(s) by brushing or washing, when performed                             (separate procedure) Diagnosis Code(s):        --- Professional ---                           K21.0, Gastro-esophageal reflux disease with                            esophagitis                           K44.9, Diaphragmatic hernia without obstruction or                            gangrene                           R93.3, Abnormal findings on diagnostic imaging of                            other parts of digestive tract CPT copyright 2016 [employer]. All rights reserved. The codes documented in this report are preliminary and upon coder review may  be revised to meet current compliance requirements. Carol Ada, MD Carol Ada, MD 05/11/2016 4:29:35 PM This report has been signed electronically. Number of Addenda: 0

## 2016-05-11 NOTE — Progress Notes (Signed)
   Subjective: Complaining of continuing burning pain in his chest and throat.  Unable to tolerate clear liquid diet this morning.  Objective:  Vital signs in last 24 hours: Vitals:   05/10/16 1135 05/10/16 1229 05/10/16 2234 05/11/16 0505  BP: (!) 158/117 (!) 162/100 133/61 (!) 135/93  Pulse: (!) 105 (!) 102 81 75  Resp: 16  18 18   Temp: 98.2 F (36.8 C)  98.7 F (37.1 C) 98.6 F (37 C)  TempSrc: Oral  Oral Oral  SpO2: 100%  99% 99%  Weight:      Height:       Physical Exam  Constitutional: He is oriented to person, place, and time. He appears well-developed and well-nourished. No distress.  Cardiovascular: Normal rate and regular rhythm.   Pulmonary/Chest: Effort normal and breath sounds normal.  Abdominal:  Tender, epigastrum > RUQ Non distended  Neurological: He is alert and oriented to person, place, and time.  Psychiatric: He has a normal mood and affect. His behavior is normal.   CBC Latest Ref Rng & Units 05/09/2016 07/01/2015 01/15/2015  WBC 4.0 - 10.5 K/uL 7.8 9.0 4.0  Hemoglobin 13.0 - 17.0 g/dL 13.1 12.8(L) 13.9  Hematocrit 39.0 - 52.0 % 37.3(L) 38.1(L) 42.5  Platelets 150 - 400 K/uL 343 258 312   CMP Latest Ref Rng & Units 05/11/2016 05/10/2016 05/10/2016  Glucose 65 - 99 mg/dL 120(H) 153(H) -  BUN 6 - 20 mg/dL 5(L) 8 -  Creatinine 0.61 - 1.24 mg/dL 0.85 0.87 -  Sodium 135 - 145 mmol/L 136 133(L) -  Potassium 3.5 - 5.1 mmol/L 4.6 4.0 -  Chloride 101 - 111 mmol/L 102 98(L) -  CO2 22 - 32 mmol/L 26 26 -  Calcium 8.9 - 10.3 mg/dL 9.2 9.0 -  Total Protein 6.5 - 8.1 g/dL 6.7 7.0 8.4(H)  Total Bilirubin 0.3 - 1.2 mg/dL 1.2 1.4(H) 0.6  Alkaline Phos 38 - 126 U/L 78 86 95  AST 15 - 41 U/L 121(H) 133(H) 176(H)  ALT 17 - 63 U/L 86(H) 90(H) 99(H)     Assessment/Plan:  Principal Problem:   GERD with esophagitis Active Problems:   Alcohol abuse   Essential hypertension   Coronary artery disease   Tobacco abuse   Transaminitis   GERD (gastroesophageal reflux  disease)   56 y.o. male with multiple medical problems hospitalized for esophagitis found to have transaminitis and anion gap metabolic acidosis.  He is hemodynamically stable but having difficulty tolerating PO due to burning dysphagia.  #Esophagitis -EGD today -IV pantoprazole 40 mg BID -Zofran, morphine PRN -F/u GI recs, EGD results  #HTN -Resumed atenolol 100 mg daily  #Transaminitis Likely alcoholic hepatitis with AST/ALT >2.  Fluids: D5 NS 100 mL/hr Diet: NPO before PM EGD DVT Prophylaxis: lovenox Code Status: full  Dispo: Anticipated discharge in approximately 1-2 day(s).   Minus Liberty, MD 05/11/2016, 9:00 AM Pager: [phone]

## 2016-05-11 NOTE — Progress Notes (Signed)
Subjective: Patient is still having chest pain and burning sensation. Has been unable to eat solids. He has been able to consume some liquids but still with much pain. He doesn't quite feel as dizzy or weak as yesterday.  Objective: Vital signs in last 24 hours: Vitals:   05/10/16 1135 05/10/16 2234 05/11/16 0505  BP: (!) 158/117 133/61 (!) 135/93  Pulse: (!) 105 81 75  Resp: 16 18 18   Temp: 98.2 F (36.8 C) 98.7 F (37.1 C) 98.6 F (37 C)  TempSrc: Oral Oral Oral  SpO2: 100% 99% 99%    Intake/Output Summary (Last 24 hours) at 05/11/16 1308 Last data filed at 05/11/16 0955  Gross per 24 hour  Intake              540 ml  Output             2300 ml  Net            -1760 ml   General:  Cooperative lying in bed comfortably, NAD Head:  Normocephalic, atraumatic, no obvious lesions Eyes:  PERRL, conjunctiva clear, EOM intact ENT:  Normal external ear canals, no drainage or sinus tenderness, has 3 teeth only, dry mucous membranes, posterior oropharynx without erythema or exudate Neck:  Supple, symmetrical, no thyroid enlargement, tenderness or nodules Vascular:  No carotid or aortic bruits. Bilateral pulses 2+ all extremities CV:  Normal rate and rhythm, no murmur, rub, or gallop Pulmonary:  Clear to auscultation bilaterally, normal effort Chest wall:  Tender to palpation over sternal area Abdomen:  Soft, diffuse mild tenderness with increased tenderness over epigastric and LUQ. No masses or organomegaly, no distention Extremities:  No edema or swelling Lymph Nodes:  No cervical or supraclavicular lymphadenopathy Neuro:  Alert and Oriented X3, CNII-XII intact, normal strength, sensation, and reflexes Psych:  Mood and mannerisms appropriate  Lab Results:  CMP Latest Ref Rng & Units 05/11/2016 05/10/2016 05/10/2016  Glucose 65 - 99 mg/dL 120(H) 153(H) -  BUN 6 - 20 mg/dL 5(L) 8 -  Creatinine 0.61 - 1.24 mg/dL 0.85 0.87 -  Sodium 135 - 145 mmol/L 136 133(L) -  Potassium 3.5 - 5.1  mmol/L 4.6 4.0 -  Chloride 101 - 111 mmol/L 102 98(L) -  CO2 22 - 32 mmol/L 26 26 -  Calcium 8.9 - 10.3 mg/dL 9.2 9.0 -  Albumin 3.5 - 5.0 g/dL 3.0 (L) 3.2 (L) 3.9  Total Protein 6.5 - 8.1 g/dL 6.7 7.0 8.4(H)  Total Bilirubin 0.3 - 1.2 mg/dL 1.2 1.4(H) 0.6  Alkaline Phos 38 - 126 U/L 78 86 95  AST 15 - 41 U/L 121(H) 133(H) 176(H)  ALT 17 - 63 U/L 86(H) 90(H) 99(H)     Lipase: 32   Medications:  Scheduled Meds: . atenolol  100 mg Oral Daily  . enoxaparin (LOVENOX) injection  40 mg Subcutaneous Q24H  . Influenza vac split quadrivalent PF  0.5 mL Intramuscular Tomorrow-1000  . pantoprazole (PROTONIX) IV  40 mg Intravenous Q12H  . pneumococcal 23 valent vaccine  0.5 mL Intramuscular Tomorrow-1000   Continuous Infusions:   PRN Meds:.morphine injection   Assessment/Plan: Principal Problem:   GERD with esophagitis Active Problems:   Alcohol abuse   Essential hypertension   Coronary artery disease   Tobacco abuse   Transaminitis   GERD (gastroesophageal reflux disease)  Devin [name] is a 56 year-old male with history of GERD, HTN, CAD, and alcohol abuse that is on hospital day #2 for an acute exacerbation of  GERD with an accompanying esophagitis.  GERD/Esophagitis: The etiology is most likely to be multifactorial. His history of using alcohol, NSAIDs, Tobacco, and eating spicy foods are all risk factors for GERD. He has no signs of active bleeding. His Hb was within normal limits upon being admitted.  Continue Pantoprazole IV  Continue Ondansetron PRN for nausea  Continue Morphine PRN for pain control  Scheduled for EGD procedure today, will fullow-up on results and GI recommendations  Currently on NPO status until EGD is completed  HTN: He has not been taking his medication for several months. After treating with Hydralazine and atenolol, his BP is much improved today.  Continue atenolol  Transaminitis:  His initial AST/ALT (176/99) is suggestive of alcoholic  hepatitis. His lab results show improvement with AST 121 and ALT 86. HIV & Hepatitis tests were all negative. Lipase is within normal limits suggesting that there is no current pancreatitis  No intervention, will continue to monitor  CAD: No records available showing details from his MI in 2003. Most recent echo from 2013 shows an EF 65-70%. Additionally, his ECG from yesterday shows not changes from his ECG 06/2014.  Consider starting 81 mg Aspirin  Resume statin (Atorvastatin) upon discharge  Alcohol/substance abuse:  Recommend substance abuse counseling upon discharge  IVF: D5 NS 100 mL/hr  DVT Prophylaxis:  Lovenox  Disposition: Patient is expected to remain in hospital for additional 1-2 midnights.  This is a Careers information officer Note.  The care of the patient was discussed with Dr. Inda Castle and the assessment and plan formulated with their assistance.  Please see their attached note for official documentation of the daily encounter.    Twanna Hy, Medical Student 05/11/2016, 1:08 PM
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
## 2016-05-11 NOTE — Interval H&P Note (Signed)
History and Physical Interval Note:  05/11/2016 4:07 PM  Devin [name]  has presented today for surgery, with the diagnosis of chest pain/GERD  The various methods of treatment have been discussed with the patient and family. After consideration of risks, benefits and other options for treatment, the patient has consented to  Procedure(s): ESOPHAGOGASTRODUODENOSCOPY (EGD) (N/A) as a surgical intervention .  The patient's history has been reviewed, patient examined, no change in status, stable for surgery.  I have reviewed the patient's chart and labs.  Questions were answered to the patient's satisfaction.     [name] D

## 2016-05-12 ENCOUNTER — Other Ambulatory Visit: Payer: Self-pay

## 2016-05-12 ENCOUNTER — Encounter (HOSPITAL_COMMUNITY): Payer: Self-pay | Admitting: Gastroenterology

## 2016-05-12 DIAGNOSIS — K210 Gastro-esophageal reflux disease with esophagitis: Principal | ICD-10-CM

## 2016-05-12 DIAGNOSIS — I2510 Atherosclerotic heart disease of native coronary artery without angina pectoris: Secondary | ICD-10-CM

## 2016-05-12 LAB — BASIC METABOLIC PANEL
Anion gap: 7 (ref 5–15)
CHLORIDE: 104 mmol/L (ref 101–111)
CO2: 24 mmol/L (ref 22–32)
CREATININE: 0.94 mg/dL (ref 0.61–1.24)
Calcium: 9.7 mg/dL (ref 8.9–10.3)
GFR calc Af Amer: >60 mL/min (ref >60)
GFR calc non Af Amer: >60 mL/min (ref >60)
Glucose, Bld: 131 mg/dL — ABNORMAL HIGH (ref 65–99)
POTASSIUM: 4 mmol/L (ref 3.5–5.1)
SODIUM: 135 mmol/L (ref 135–145)

## 2016-05-12 LAB — CBC
HEMATOCRIT: 36.2 % — AB (ref 39.0–52.0)
HEMOGLOBIN: 11.8 g/dL — AB (ref 13.0–17.0)
MCH: 32.2 pg (ref 26.0–34.0)
MCHC: 32.6 g/dL (ref 30.0–36.0)
MCV: 98.6 fL (ref 78.0–100.0)
Platelets: 277 10*3/uL (ref 150–400)
RBC: 3.67 MIL/uL — AB (ref 4.22–5.81)
RDW: 13.3 % (ref 11.5–15.5)
WBC: 5.8 10*3/uL (ref 4.0–10.5)

## 2016-05-12 MED ORDER — PANTOPRAZOLE SODIUM 40 MG PO TBEC: 40.0000 mg | DELAYED_RELEASE_TABLET | Freq: Two times a day (BID) | ORAL | 0 refills | Status: DC

## 2016-05-12 MED ORDER — ATENOLOL 100 MG PO TABS: 100.0000 mg | ORAL_TABLET | Freq: Every day | ORAL | 3 refills | Status: DC

## 2016-05-12 MED ORDER — PANTOPRAZOLE SODIUM 40 MG PO TBEC: 40.0000 mg | DELAYED_RELEASE_TABLET | Freq: Every day | ORAL | 3 refills | Status: DC

## 2016-05-12 MED ORDER — ATORVASTATIN CALCIUM 40 MG PO TABS: 40.0000 mg | ORAL_TABLET | Freq: Every day | ORAL | 3 refills | Status: DC

## 2016-05-12 NOTE — Discharge Planning (Signed)
CSW Judson Roch notified of need for bus pass for patient.

## 2016-05-12 NOTE — Discharge Instructions (Addendum)
You were admitted to the hospital due to burning pain in your belly and throat.  We found that you have inflammation in your throat (esophagitis) from heartburn, but that there is not anything else dangerous going on (no heart attack).    To treat this inflammation, I have prescribed you an acid reducing medicine (Protonix) to take twice per day for 1 month, then once a day every day after that.  I have also given you a new prescription for atorvastatin to take every day for your heart.  It is also important that you don't drink alcohol and avoid NSAID medicines like ibuprofen, naproxen, Motrin, Aleve, Mobic, etc.  It is OK to take Tylenol for pain, just don't take more than 3000 mg in one day.  Please [facility].  If you have new chest pain or shortness of breath, please come back to the ED as this could still be signs of something dangerous.    Esophagitis Introduction Esophagitis is inflammation of the esophagus. The esophagus is the tube that carries food and liquids from your mouth to your stomach. Esophagitis can cause soreness or pain in the esophagus. This condition can make it difficult and painful to swallow. What are the causes? Most causes of esophagitis are not serious. Common causes of this condition include:  Gastroesophageal reflux disease (GERD). This is when stomach contents move back up into the esophagus (reflux).  Repeated vomiting.  An allergic-type reaction, especially caused by food allergies (eosinophilic esophagitis).  Injury to the esophagus by swallowing large pills with or without water, or swallowing certain types of medicines.  Swallowing (ingesting) harmful chemicals, such as household cleaning products.  Heavy alcohol use.  An infection of the esophagus.This most often occurs in people who have a weakened immune system.  Radiation or chemotherapy treatment for cancer.  Certain diseases such as sarcoidosis, Crohn  disease, and scleroderma. What are the signs or symptoms? Symptoms of this condition include:  Difficult or painful swallowing.  Pain with swallowing acidic liquids, such as citrus juices.  Pain with burping.  Chest pain.  Difficulty breathing.  Nausea.  Vomiting.  Pain in the abdomen.  Weight loss.  Ulcers in the mouth.  Patches of white material in the mouth (candidiasis).  Fever.  Coughing up blood or vomiting blood.  Stool that is black, tarry, or bright red. How is this diagnosed? Your health care provider will take a medical history and perform a physical exam. You may also have other tests, including:  An endoscopy to examine your stomach and esophagus with a small camera.  A test that measures the acidity level in your esophagus.  A test that measures how much pressure is on your esophagus.  A barium swallow or modified barium swallow to show the shape, size, and functioning of your esophagus.  Allergy tests. How is this treated? Treatment for this condition depends on the cause of your esophagitis. In some cases, steroids or other medicines may be given to help relieve your symptoms or to treat the underlying cause of your condition. You may have to make some lifestyle changes, such as:  Avoiding alcohol.  Quitting smoking.  Changing your diet.  Exercising.  Changing your sleep habits and your sleep environment. Follow these instructions at home: Take these actions to decrease your discomfort and to help avoid complications. Diet  Follow a diet as recommended by your health care provider. This may involve avoiding foods and drinks such as:  Coffee and  tea (with or without caffeine).  Drinks that contain alcohol.  Energy drinks and sports drinks.  Carbonated drinks or sodas.  Chocolate and cocoa.  Peppermint and mint flavorings.  Garlic and onions.  Horseradish.  Spicy and acidic foods, including peppers, chili powder, curry powder,  vinegar, hot sauces, and barbecue sauce.  Citrus fruit juices and citrus fruits, such as oranges, lemons, and limes.  Tomato-based foods, such as red sauce, chili, salsa, and pizza with red sauce.  Fried and fatty foods, such as donuts, french fries, potato chips, and high-fat dressings.  High-fat meats, such as hot dogs and fatty cuts of red and white meats, such as rib eye steak, sausage, ham, and bacon.  High-fat dairy items, such as whole milk, butter, and cream cheese.  Eat small, frequent meals instead of large meals.  Avoid drinking large amounts of liquid with your meals.  Avoid eating meals during the 2-3 hours before bedtime.  Avoid lying down right after you eat.  Do not exercise right after you eat.  Avoid foods and drinks that seem to make your symptoms worse. General instructions  Pay attention to any changes in your symptoms.  Take over-the-counter and prescription medicines only as told by your health care provider. Do not take aspirin, ibuprofen, or other NSAIDs unless your health care provider told you to do so.  If you have trouble taking pills, use a pill splitter to decrease the size of the pill. This will decrease the chance of the pill getting stuck or injuring your esophagus on the way down. Also, drink water after you take a pill.  Do not use any tobacco products, including cigarettes, chewing tobacco, and e-cigarettes. If you need help quitting, ask your health care provider.  Wear loose-fitting clothing. Do not wear anything tight around your waist that causes pressure on your abdomen.  Raise (elevate) the head of your bed about 6 inches (15 cm).  Try to reduce your stress, such as with yoga or meditation. If you need help reducing stress, ask your health care provider.  If you are overweight, reduce your weight to an amount that is healthy for you. Ask your health care provider for guidance about a safe weight loss goal.  Keep all follow-up visits  as told by your health care provider. This is important. Contact a health care provider if:  You have new symptoms.  You have unexplained weight loss.  You have difficulty swallowing, or it hurts to swallow.  You have wheezing or a persistent cough.  Your symptoms do not improve with treatment.  You have frequent heartburn for more than two weeks. Get help right away if:  You have severe pain in your arms, neck, jaw, teeth, or back.  You feel sweaty, dizzy, or light-headed.  You have chest pain or shortness of breath.  You vomit and your vomit looks like blood or coffee grounds.  Your stool is bloody or black.  You have a fever.  You cannot swallow, drink, or eat. This information is not intended to replace advice given to you by your health care provider. Make sure you discuss any questions you have with your health care provider. Document Released: 05/25/2004 Document Revised: 09/23/2015 Document Reviewed: 08/12/2014  2017 [employer]

## 2016-05-12 NOTE — Clinical Social Work Note (Signed)
Clinical Social Work Assessment  Patient Details  Name: Devin [name] MRN: [id number] Date of Birth: 03/15/1961  Date of referral:  05/12/16               Reason for consult:  Housing Concerns/Homelessness                Permission sought to share information with:  Family Supports Permission granted to share information::  No  Name::        Agency::     Relationship::     Contact Information:     Housing/Transportation Living arrangements for the past 2 months:  Homeless Shelter, No permanent address, Hotel/Motel, Homeless Source of Information:  Patient Patient Interpreter Needed:  None Criminal Activity/Legal Involvement Pertinent to Current Situation/Hospitalization:   No Significant Relationships:  Other Family Members, Church Lives with:  Other (Comment) Do you feel safe going back to the place where you live?  Yes Need for family participation in patient care:  No (Coment)  Care giving concerns:  Homeless  Social Worker assessment / plan:  CSW met with pt to address consult for homelessness. CSW introduced herself and explained role of social work. Pt reports that he has staying with his niece in [place] and a hotel. Pt is unable to return to the hotel because of the lack of money. Pt is also resistant to reaching out to niece because she "has her own life." Pt reports that he has been to the shelter in the past as they opened space due to weather, however is unable to return due to being full. CSW recommended to reach out to family as well as attempt to obtain shelter housing. CSW also recommended that pt go to the [facility] for further assistance. Pt shared that he has worked with clinical social worker in the clinical in the past, and CSW encouraged pt reach out as well. CSW provided bus passes to get him through weekend. Resources have been provided, and pt reports that he reached out to his pastor. CSW is signing off as no further needs identified.   Employment status:   Unemployed Insurance information:  Self Pay (Medicaid Pending) PT Recommendations:  Not assessed at this time Information / Referral to community resources:  Shelter, Other (Comment Required) ([facility] and [place] Assistance Programs)  Patient/Family's Response to care:  Pt was appreciative of CSW support.   Patient/Family's Understanding of and Emotional Response to Diagnosis, Current Treatment, and Prognosis:  Pt understands that resources are limited in the community.   Emotional Assessment Appearance:  Appears stated age Attitude/Demeanor/Rapport:   (Appropriate) Affect (typically observed):  Accepting, Adaptable, Pleasant Orientation:  Oriented to Self, Oriented to Place, Oriented to  Time, Oriented to Situation Alcohol / Substance use:  Not Applicable Psych involvement (Current and /or in the community):  No (Comment)  Discharge Needs  Concerns to be addressed:  Homelessness Readmission within the last 30 days:  No Current discharge risk:  Homeless Barriers to Discharge:  Other    , LCSW 05/12/2016, 2:57 PM  

## 2016-05-12 NOTE — Progress Notes (Signed)
   Subjective: Underwent EGD yesterday which showed severe esophagitis.  Able to tolerate normal diet last night, though with persistent burning chest pain.  Objective:  Vital signs in last 24 hours: Vitals:   05/11/16 1630 05/11/16 1648 05/11/16 2145 05/12/16 0418  BP: (!) 99/53 (!) 142/91 127/89 (!) 143/96  Pulse: 81 84 87 88  Resp: 12 13 16 18   Temp:  97.7 F (36.5 C) 98.5 F (36.9 C) 98.3 F (36.8 C)  TempSrc:  Oral Oral Oral  SpO2: 97% 99% 100% 100%  Weight:      Height:       Physical Exam  Constitutional: He is oriented to person, place, and time. He appears well-developed and well-nourished. No distress.  Cardiovascular: Normal rate and regular rhythm.   Pulmonary/Chest: Effort normal and breath sounds normal.  Abdominal:  Tender, epigastrum > RUQ Non distended  Neurological: He is alert and oriented to person, place, and time.  Psychiatric: He has a normal mood and affect. His behavior is normal.   CBC Latest Ref Rng & Units 05/12/2016 05/09/2016 07/01/2015  WBC 4.0 - 10.5 K/uL 5.8 7.8 9.0  Hemoglobin 13.0 - 17.0 g/dL 11.8(L) 13.1 12.8(L)  Hematocrit 39.0 - 52.0 % 36.2(L) 37.3(L) 38.1(L)  Platelets 150 - 400 K/uL 277 343 258   CMP Latest Ref Rng & Units 05/12/2016 05/11/2016 05/10/2016  Glucose 65 - 99 mg/dL 131(H) 120(H) 153(H)  BUN 6 - 20 mg/dL <5(L) 5(L) 8  Creatinine 0.61 - 1.24 mg/dL 0.94 0.85 0.87  Sodium 135 - 145 mmol/L 135 136 133(L)  Potassium 3.5 - 5.1 mmol/L 4.0 4.6 4.0  Chloride 101 - 111 mmol/L 104 102 98(L)  CO2 22 - 32 mmol/L 24 26 26   Calcium 8.9 - 10.3 mg/dL 9.7 9.2 9.0  Total Protein 6.5 - 8.1 g/dL - 6.7 7.0  Total Bilirubin 0.3 - 1.2 mg/dL - 1.2 1.4(H)  Alkaline Phos 38 - 126 U/L - 78 86  AST 15 - 41 U/L - 121(H) 133(H)  ALT 17 - 63 U/L - 86(H) 90(H)     Assessment/Plan:  Principal Problem:   GERD with esophagitis Active Problems:   Alcohol abuse   Essential hypertension   Coronary artery disease   Tobacco abuse   Transaminitis  GERD (gastroesophageal reflux disease)   Esophagitis   56 y.o. male with burning chest pain and severe esophagitis on EGD.  He is tolerating PO and ready for discharge with PPI.  #Esophagitis -BID PPI for 1 month, than daily PPI -Zofran, morphine PRN  #HTN -atenolol 100 mg daily  #Transaminitis Likely alcoholic hepatitis with AST/ALT >2.  Fluids: none Diet: heart DVT Prophylaxis: lovenox Code Status: full  Dispo: Anticipated discharge today.  Minus Liberty, MD 05/12/2016, 7:00 AM Pager: [phone]

## 2016-05-12 NOTE — Discharge Planning (Signed)
AVS and rx given to pt who verbalizes understanding. Discharged to bus stop ambulatory with all personal belongings by self at 1600.

## 2016-05-12 NOTE — Telephone Encounter (Signed)
Hospital TOC. 

## 2016-05-12 NOTE — Care Management Note (Signed)
Case Management Note  Patient Details  Name: Devin [name] MRN: [id number] Date of Birth: 27-Mar-1961  Subjective/Objective:                    Action/Plan:  Provided patient with Welcome letter ( medication assistance ) dated 05-12-16 to 05-19-16 good for one time use.   Patient was staying with his niece in [place] prior to admission , however, states he cannot go back there at discharge. He wants to stay in [place] at discharge.   He has been at [facility] before and is assigned to MD there at discharge. He has had an orange in past but it is expired. He plans on seeing Clarice Pole to apply for a new card.  Consulted SW for homeless.  Expected Discharge Date:  05/12/16               Expected Discharge Plan:  Home/Self Care  In-House Referral:  NA, Clinical Social Work  Discharge planning Services  CM Consult, [facility], Medication Assistance  Post Acute Care Choice:    Choice offered to:  Patient  DME Arranged:    DME Agency:     HH Arranged:    [place] Agency:     Status of Service:  Completed, signed off  If discussed at [employer], dates discussed:    Additional Comments:  Marilu Favre, RN 05/12/2016, 10:02 AM

## 2016-05-12 NOTE — Discharge Summary (Signed)
Name: SHAWNDEL [name] MRN: [id number] DOB: March 26, 1961 56 y.o. PCP: Minus Liberty, MD  Date of Admission: 05/10/2016 12:29 AM Date of Discharge: 05/12/2016 Attending Physician: Annia Belt, MD  Discharge Diagnosis:  Principal Problem:   GERD with esophagitis Active Problems:   Alcohol abuse   Essential hypertension   Coronary artery disease   Tobacco abuse   Transaminitis   GERD (gastroesophageal reflux disease)   Esophagitis   Discharge Medications: Allergies as of 05/12/2016   No Known Allergies     Medication List    TAKE these medications   aspirin EC 81 MG tablet Take 1 tablet (81 mg total) by mouth daily. What changed:  when to take this   atenolol 100 MG tablet Commonly known as:  TENORMIN Take 1 tablet (100 mg total) by mouth daily. Start taking on:  05/13/2016   atorvastatin 40 MG tablet Commonly known as:  LIPITOR Take 1 tablet (40 mg total) by mouth daily.   nitroGLYCERIN 0.4 MG SL tablet Commonly known as:  NITROSTAT Place 1 tablet (0.4 mg total) under the tongue every 5 (five) minutes as needed for chest pain (x 3 doses daily).   pantoprazole 40 MG tablet Commonly known as:  PROTONIX Take 1 tablet (40 mg total) by mouth 2 (two) times daily.   pantoprazole 40 MG tablet Commonly known as:  PROTONIX Take 1 tablet (40 mg total) by mouth daily. Start taking on:  06/11/2016       Disposition and follow-up:   Devin [name] was discharged from [facility] in Stable condition.  At the hospital follow up visit please address:  1.  GERD.  Ask about reflux symptoms, oral intake.  Remind to avoid NSAIDs and alcohol.  2.  CAD.  Follow-up compliance and access to statin and aspirin.  3.  HTN.  Discharged on prior dose of atenolol.  Continue optimizing antihypertensive regimen.  4.  Alcohol Abuse.  Ask about drinking.  Remind about importance of abstinence for his esophagitis and hepatitis.  5.  Labs / imaging needed at  time of follow-up: none  6.  Pending labs/ test needing follow-up: none  Follow-up Appointments: Follow-up Information    [place]. Go in 7 day(s).   Why:  at 2:45 pm Contact information: [address] [place] Hospital Course by problem list: Principal Problem:   GERD with esophagitis Active Problems:   Alcohol abuse   Essential hypertension   Coronary artery disease   Tobacco abuse   Transaminitis   GERD (gastroesophageal reflux disease)   Esophagitis   1. GERD/Esophagitis Devin [name] is a 56 year old man with history of GERD, CAD, and alcohol abuse who presented with several days of worsening burning substernal chest pain.  His atypical chest pain was ruled out for ACS with nonischemic EKG and negative troponins.  He was given IVF and pain and nausea treated.  With most likely etiology deemed to be GERD, EGD was performed which showed severe esophagitis in distal esophagus.  He was started on BID PPI for 1 month to be followed by daily PPI thereafter.  Though he continued to have chest/abdominal pain, he was able to tolerate a regular diet before discharge.  He was advised not to drink alcohol and to avoid NSAIDs.  2. HTN Resumed previously prescribed atenolol 100 mg daily, and became normotensive to slightly hypertensive with HRs 70s-80s.  3. Transaminitis Mildly elevated transaminitis with AST great than ALT and RUQ tenderness consistent with alcoholic hepatitis.  Normal bilirubin, no stigmata of cirrhosis.  Advised to stop drinking alcohol.   Discharge Vitals:   BP (!) 143/96 (BP Location: Left Arm)   Pulse 88   Temp 98.3 F (36.8 C) (Oral)   Resp 18   Ht 5\' 10"  (1.778 m)   Wt 150 lb (68 kg)   SpO2 100%   BMI 21.52 kg/m   Pertinent Labs, Studies, and Procedures:   CBC Latest Ref Rng & Units 05/12/2016 05/09/2016 07/01/2015  WBC 4.0 - 10.5 K/uL 5.8 7.8 9.0  Hemoglobin 13.0 - 17.0 g/dL  11.8(L) 13.1 12.8(L)  Hematocrit 39.0 - 52.0 % 36.2(L) 37.3(L) 38.1(L)  Platelets 150 - 400 K/uL 277 343 258   CMP Latest Ref Rng & Units 05/12/2016 05/11/2016 05/10/2016  Glucose 65 - 99 mg/dL 131(H) 120(H) 153(H)  BUN 6 - 20 mg/dL <5(L) 5(L) 8  Creatinine 0.61 - 1.24 mg/dL 0.94 0.85 0.87  Sodium 135 - 145 mmol/L 135 136 133(L)  Potassium 3.5 - 5.1 mmol/L 4.0 4.6 4.0  Chloride 101 - 111 mmol/L 104 102 98(L)  CO2 22 - 32 mmol/L 24 26 26   Calcium 8.9 - 10.3 mg/dL 9.7 9.2 9.0  Total Protein 6.5 - 8.1 g/dL - 6.7 7.0  Total Bilirubin 0.3 - 1.2 mg/dL - 1.2 1.4(H)  Alkaline Phos 38 - 126 U/L - 78 86  AST 15 - 41 U/L - 121(H) 133(H)  ALT 17 - 63 U/L - 86(H) 90(H)   Troponin (Point of Care Test)  Recent Labs  05/10/16 0356  TROPIPOC 0.01   Lipase     Component Value Date/Time   LIPASE 32 05/10/2016 1641   Component     Latest Ref Rng & Units 05/10/2016  Hepatitis B Surface Ag     Negative Negative  HCV Ab     0.0 - 0.9 s/co ratio <0.1  Hep A Ab, IgM     Negative Negative  Hep B Core Ab, IgM     Negative Negative   Component     Latest Ref Rng & Units 05/10/2016  HIV     Non Reactive Non Reactive   EGD 05/11/2016 - LA Grade D reflux esophagitis. - 3 cm hiatal hernia. - Normal stomach. - Normal examined duodenum. - No specimens collected.  Discharge Instructions: Discharge Instructions    Diet - low sodium heart healthy    Complete by:  As directed    Increase activity slowly    Complete by:  As directed      You were admitted to the hospital due to burning pain in your belly and throat.  We found that you have inflammation in your throat (esophagitis) from heartburn, but that there is not anything else dangerous going on (no heart attack).    To treat this inflammation, I have prescribed you an acid reducing medicine (Protonix) to take twice per day for 1 month, then once a day every day after that.  I have also given you a new prescription for atorvastatin to take  every day for your heart.  It is also important that you don't drink alcohol and avoid NSAID medicines like ibuprofen, naproxen, Motrin, Aleve, Mobic, etc.  It is OK to take Tylenol for pain, just don't take more than 3000 mg in one day.  Please follow-up in the [facility].  If you have new chest pain or shortness of  breath, please come back to the ED as this could still be signs of something dangerous.  Signed: Minus Liberty, MD 05/12/2016, 12:55 PM   Pager: [phone]

## 2016-05-12 NOTE — Progress Notes (Signed)
Subjective: He has been able to eat a regular diet but still has a persistent burning chest pain.  Objective: Vital signs in last 24 hours: Vitals:   05/11/16 1648 05/11/16 2145 05/12/16 0418  BP: (!) 142/91 127/89 (!) 143/96  Pulse: 84 87 88  Resp: 13 16 18   Temp: 97.7 F (36.5 C) 98.5 F (36.9 C) 98.3 F (36.8 C)  TempSrc: Oral Oral Oral  SpO2: 99% 100% 100%    Intake/Output Summary (Last 24 hours) at 05/12/16 1159 Last data filed at 05/12/16 0900  Gross per 24 hour  Intake          2221.67 ml  Output             1375 ml  Net           846.67 ml   General: Cooperative lying in bed comfortably, NAD Head: Normocephalic, atraumatic, no obvious lesions Eyes: PERRL, conjunctiva clear, EOM intact ENT: Normal external ear canals, no drainage or sinus tenderness, has 3 teeth only, posterior oropharynx without erythema or exudate Neck: Supple, symmetrical, no thyroid enlargement, tenderness or nodules Vascular: No carotid or aortic bruits. Bilateral pulses 2+ all extremities CV: Normal rate and rhythm, no murmur, rub, or gallop Pulmonary: Clear to auscultation bilaterally, normal effort Chest wall: tender to palpationover sternal area Abdomen: Soft, diffuse mild tendernesswith increased tenderness over epigastric and LUQ. No masses or organomegaly, no distention Extremities: No edema or swelling Lymph Nodes: No cervical or supraclavicular lymphadenopathy Neuro: Alert and Oriented X3, CNII-XII intact, normal strength, sensation, and reflexes Psych: Mood and mannerisms appropriate  Lab Results:  BMET    Component Value Date/Time   NA 135 05/12/2016 0512   K 4.0 05/12/2016 0512   CL 104 05/12/2016 0512   CO2 24 05/12/2016 0512   GLUCOSE 131 (H) 05/12/2016 0512   BUN <5 (L) 05/12/2016 0512   CREATININE 0.94 05/12/2016 0512   CALCIUM 9.7 05/12/2016 0512   GFRNONAA >60 05/12/2016 0512   GFRAA >60 05/12/2016 0512   CBC    Component Value Date/Time   WBC  5.8 05/12/2016 0512   RBC 3.67 (L) 05/12/2016 0512   HGB 11.8 (L) 05/12/2016 0512   HCT 36.2 (L) 05/12/2016 0512   PLT 277 05/12/2016 0512   MCV 98.6 05/12/2016 0512   MCH 32.2 05/12/2016 0512   MCHC 32.6 05/12/2016 0512   RDW 13.3 05/12/2016 0512    EGD:   - LA Grade D reflux esophagitis.  - 3 cm hiatal hernia.  - Normal stomach & duodenum.  Medications:  Scheduled Meds: . atenolol  100 mg Oral Daily  . enoxaparin (LOVENOX) injection  40 mg Subcutaneous Q24H  . pantoprazole (PROTONIX) IV  40 mg Intravenous Q12H   Continuous Infusions: . dextrose 5 % and 0.9% NaCl 100 mL/hr at 05/12/16 0058   PRN Meds:.morphine injection    Assessment/Plan: Principal Problem:   GERD with esophagitis Active Problems:   Alcohol abuse   Essential hypertension   Coronary artery disease   Tobacco abuse   Transaminitis   GERD (gastroesophageal reflux disease)   Esophagitis  Devin [name] is a 56 year-old male with history of GERD, HTN, CAD, and alcohol abuse that is on hospital day #2 for an acute exacerbation of GERD with an accompanying esophagitis.  GERD/Esophagitis: The etiology is most likely to be multifactorial. His history of using alcohol, NSAIDs, Tobacco, and eating spicy foods are all risk factors for GERD. He has no signs of active bleeding. His Hb  was within normal limits upon being admitted.  Convert Pantoprazole IV to oral tablets  Continue Ondansetron PRN for nausea  Continue Morphine PRN for pain control  EGD testing completed by GI showed extensive reflux disease with an accompanying 3cm Hiatal hernia  GI recommendations: PPI BID x 30mth and then qdaily indefinitely  HTN: He has not been taking his medication for several months. His blood pressure has been more controlled and stable while in hospital.  Continue atenolol  Transaminitis: His initial AST/ALT (176/99) is suggestive of alcoholic hepatitis. His lab results show improvement with AST 121 and ALT 86. HIV &  Hepatitis tests were all negative. Lipase was within normal limits suggesting that there is no current pancreatitis  No intervention  CAD: No records available showing details from his MI in 2003. Most recent echo from 2013 shows an EF 65-70%. Current admission ECG shows no changes from his ECG 06/2014.  Consider starting 81 mg Aspirin  Resume statin (Atorvastatin) upon discharge  Alcohol/substance abuse:  Recommend alcohol and tobacco cess  IVF: D5 NS 100 mL/hr  DVT Prophylaxis: Lovenox  Disposition: Patient is expected to be discharged today . This is a Careers information officer Note.  The care of the patient was discussed with Dr. Inda Castle and the assessment and plan formulated with their assistance.  Please see their attached note for official documentation of the daily encounter.   LOS: 1 day   Twanna Hy, Medical Student 05/12/2016, 11:59 AM

## 2016-05-16 ENCOUNTER — Other Ambulatory Visit: Payer: Self-pay | Admitting: Pharmacist

## 2016-05-16 DIAGNOSIS — I10 Essential (primary) hypertension: Secondary | ICD-10-CM

## 2016-05-16 DIAGNOSIS — I2510 Atherosclerotic heart disease of native coronary artery without angina pectoris: Secondary | ICD-10-CM

## 2016-05-16 DIAGNOSIS — K2100 Gastro-esophageal reflux disease with esophagitis, without bleeding: Secondary | ICD-10-CM

## 2016-05-16 MED ORDER — PANTOPRAZOLE SODIUM 40 MG PO TBEC: 40.0000 mg | DELAYED_RELEASE_TABLET | Freq: Two times a day (BID) | ORAL | 0 refills | Status: DC

## 2016-05-16 MED ORDER — ATENOLOL 100 MG PO TABS: 100.0000 mg | ORAL_TABLET | Freq: Every day | ORAL | 0 refills | Status: DC

## 2016-05-16 MED ORDER — ATORVASTATIN CALCIUM 40 MG PO TABS: 40.0000 mg | ORAL_TABLET | Freq: Every day | ORAL | 0 refills | Status: DC

## 2016-05-16 MED ORDER — NITROGLYCERIN 0.4 MG SL SUBL: 0.4000 mg | SUBLINGUAL_TABLET | SUBLINGUAL | 0 refills | Status: DC | PRN

## 2016-05-16 MED FILL — NITROGLYCERIN 0.4 MG TAB SL: 0.4 | 13 days supply | Qty: 25 | Fill #0

## 2016-05-16 MED FILL — ATORVASTATIN 40 MG TABLET: 40 | 30 days supply | Qty: 30 | Fill #0

## 2016-05-16 MED FILL — PANTOPRAZOLE SOD DR 40 MG T: 40 | 30 days supply | Qty: 60 | Fill #0

## 2016-05-16 MED FILL — ATENOLOL 100 MG TABLET: 100 | 30 days supply | Qty: 30 | Fill #0

## 2016-05-16 NOTE — Progress Notes (Signed)
Patient referred to [place] for medications while Medicaid pending. Prescriptions sent.

## 2016-05-19 ENCOUNTER — Ambulatory Visit: Payer: Self-pay

## 2016-05-21 ENCOUNTER — Encounter (HOSPITAL_COMMUNITY): Payer: Self-pay

## 2016-05-21 ENCOUNTER — Emergency Department (HOSPITAL_COMMUNITY)
Admission: EM | Admit: 2016-05-21 | Discharge: 2016-05-22 | Disposition: A | Discharge status: Home / Self Care | Payer: Self-pay | Attending: Emergency Medicine | Admitting: Emergency Medicine

## 2016-05-21 ENCOUNTER — Emergency Department (HOSPITAL_COMMUNITY): Payer: Self-pay

## 2016-05-21 DIAGNOSIS — R079 Chest pain, unspecified: Secondary | ICD-10-CM | POA: Insufficient documentation

## 2016-05-21 DIAGNOSIS — F17210 Nicotine dependence, cigarettes, uncomplicated: Secondary | ICD-10-CM | POA: Insufficient documentation

## 2016-05-21 DIAGNOSIS — Z79899 Other long term (current) drug therapy: Secondary | ICD-10-CM | POA: Insufficient documentation

## 2016-05-21 DIAGNOSIS — I10 Essential (primary) hypertension: Secondary | ICD-10-CM | POA: Insufficient documentation

## 2016-05-21 DIAGNOSIS — Z7982 Long term (current) use of aspirin: Secondary | ICD-10-CM | POA: Insufficient documentation

## 2016-05-21 DIAGNOSIS — I252 Old myocardial infarction: Secondary | ICD-10-CM | POA: Insufficient documentation

## 2016-05-21 LAB — BASIC METABOLIC PANEL
ANION GAP: 13 (ref 5–15)
BUN: 8 mg/dL (ref 6–20)
CHLORIDE: 103 mmol/L (ref 101–111)
CO2: 23 mmol/L (ref 22–32)
Calcium: 8.8 mg/dL — ABNORMAL LOW (ref 8.9–10.3)
Creatinine, Ser: 0.9 mg/dL (ref 0.61–1.24)
GFR calc non Af Amer: >60 mL/min (ref >60)
GLUCOSE: 117 mg/dL — AB (ref 65–99)
POTASSIUM: 3.5 mmol/L (ref 3.5–5.1)
Sodium: 139 mmol/L (ref 135–145)

## 2016-05-21 LAB — CBC
HEMATOCRIT: 35.4 % — AB (ref 39.0–52.0)
HEMOGLOBIN: 11.8 g/dL — AB (ref 13.0–17.0)
MCH: 32.8 pg (ref 26.0–34.0)
MCHC: 33.3 g/dL (ref 30.0–36.0)
MCV: 98.3 fL (ref 78.0–100.0)
Platelets: 382 10*3/uL (ref 150–400)
RBC: 3.6 MIL/uL — ABNORMAL LOW (ref 4.22–5.81)
RDW: 14.2 % (ref 11.5–15.5)
WBC: 6.5 10*3/uL (ref 4.0–10.5)

## 2016-05-21 LAB — RAPID URINE DRUG SCREEN, HOSP PERFORMED
AMPHETAMINES: NOT DETECTED
BARBITURATES: NOT DETECTED
BENZODIAZEPINES: NOT DETECTED
COCAINE: NOT DETECTED
Opiates: NOT DETECTED
TETRAHYDROCANNABINOL: NOT DETECTED

## 2016-05-21 LAB — URINALYSIS, ROUTINE W REFLEX MICROSCOPIC
BILIRUBIN URINE: NEGATIVE
GLUCOSE, UA: NEGATIVE mg/dL
HGB URINE DIPSTICK: NEGATIVE
Ketones, ur: NEGATIVE mg/dL
Leukocytes, UA: NEGATIVE
Nitrite: NEGATIVE
PROTEIN: NEGATIVE mg/dL
Specific Gravity, Urine: 1.005 (ref 1.005–1.030)
pH: 5 (ref 5.0–8.0)

## 2016-05-21 LAB — I-STAT TROPONIN, ED: TROPONIN I, POC: 0 ng/mL (ref 0.00–0.08)

## 2016-05-21 MED ORDER — MORPHINE SULFATE (PF) 4 MG/ML IV SOLN
4.0000 mg | Freq: Once | INTRAVENOUS | Status: AC
  Administered 2016-05-21: 4 mg via INTRAVENOUS
  Filled 2016-05-21: qty 1

## 2016-05-21 NOTE — ED Triage Notes (Signed)
Chest pain x 1 hr. Central chest increased with palpation- radiates to right arm and back. Pt endorses sob. VSS.
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
## 2016-05-22 ENCOUNTER — Ambulatory Visit: Payer: Self-pay

## 2016-05-22 ENCOUNTER — Encounter (HOSPITAL_COMMUNITY): Payer: Self-pay

## 2016-05-22 DIAGNOSIS — I10 Essential (primary) hypertension: Secondary | ICD-10-CM | POA: Insufficient documentation

## 2016-05-22 DIAGNOSIS — Z79899 Other long term (current) drug therapy: Secondary | ICD-10-CM | POA: Insufficient documentation

## 2016-05-22 DIAGNOSIS — F17210 Nicotine dependence, cigarettes, uncomplicated: Secondary | ICD-10-CM | POA: Insufficient documentation

## 2016-05-22 DIAGNOSIS — I2510 Atherosclerotic heart disease of native coronary artery without angina pectoris: Secondary | ICD-10-CM | POA: Insufficient documentation

## 2016-05-22 DIAGNOSIS — R1013 Epigastric pain: Secondary | ICD-10-CM | POA: Insufficient documentation

## 2016-05-22 DIAGNOSIS — R112 Nausea with vomiting, unspecified: Secondary | ICD-10-CM | POA: Insufficient documentation

## 2016-05-22 DIAGNOSIS — Z7982 Long term (current) use of aspirin: Secondary | ICD-10-CM | POA: Insufficient documentation

## 2016-05-22 DIAGNOSIS — I252 Old myocardial infarction: Secondary | ICD-10-CM | POA: Insufficient documentation

## 2016-05-22 LAB — I-STAT TROPONIN, ED: TROPONIN I, POC: 0.01 ng/mL (ref 0.00–0.08)

## 2016-05-22 LAB — CBC
HEMATOCRIT: 35.9 % — AB (ref 39.0–52.0)
HEMOGLOBIN: 11.9 g/dL — AB (ref 13.0–17.0)
MCH: 32.8 pg (ref 26.0–34.0)
MCHC: 33.1 g/dL (ref 30.0–36.0)
MCV: 98.9 fL (ref 78.0–100.0)
Platelets: 393 10*3/uL (ref 150–400)
RBC: 3.63 MIL/uL — ABNORMAL LOW (ref 4.22–5.81)
RDW: 14.4 % (ref 11.5–15.5)
WBC: 7.5 10*3/uL (ref 4.0–10.5)

## 2016-05-22 LAB — URINALYSIS, ROUTINE W REFLEX MICROSCOPIC
Bilirubin Urine: NEGATIVE
Glucose, UA: NEGATIVE mg/dL
Hgb urine dipstick: NEGATIVE
Ketones, ur: NEGATIVE mg/dL
LEUKOCYTES UA: NEGATIVE
NITRITE: NEGATIVE
PH: 5 (ref 5.0–8.0)
Protein, ur: NEGATIVE mg/dL
Specific Gravity, Urine: 1.016 (ref 1.005–1.030)

## 2016-05-22 MED ORDER — ONDANSETRON 4 MG PO TBDP
ORAL_TABLET | ORAL | Status: AC
  Filled 2016-05-22: qty 1

## 2016-05-22 MED ORDER — ONDANSETRON 4 MG PO TBDP
4.0000 mg | ORAL_TABLET | Freq: Once | ORAL | Status: AC | PRN
  Administered 2016-05-22: 4 mg via ORAL

## 2016-05-22 MED ORDER — SODIUM CHLORIDE 0.9 % IV BOLUS (SEPSIS)
1000.0000 mL | Freq: Once | INTRAVENOUS | Status: AC
  Administered 2016-05-22: 1000 mL via INTRAVENOUS

## 2016-05-22 NOTE — Discharge Instructions (Signed)
There were no acute abnormalities on labs or x-ray today. Please follow up with your primary care provider, as planned, later today. May take ibuprofen, naproxen, or Tylenol for further discomfort. Return to the ED should symptoms recur or other concerning symptoms arise.

## 2016-05-22 NOTE — ED Triage Notes (Signed)
Pt states he started having n/v today with abdominal pains; pt think it may be his meds but could not state med he was taking; Pt states he unable to keep anything down; Pt states abdominal pain 8/10 on arrival. Pt a&ox 4 on arrival.

## 2016-05-23 ENCOUNTER — Emergency Department (HOSPITAL_COMMUNITY)
Admission: EM | Admit: 2016-05-23 | Discharge: 2016-05-23 | Disposition: A | Discharge status: Home / Self Care | Payer: Self-pay | Attending: Emergency Medicine | Admitting: Emergency Medicine

## 2016-05-23 ENCOUNTER — Ambulatory Visit (INDEPENDENT_AMBULATORY_CARE_PROVIDER_SITE_OTHER): Payer: Self-pay | Admitting: Internal Medicine

## 2016-05-23 ENCOUNTER — Emergency Department (HOSPITAL_COMMUNITY): Payer: Self-pay

## 2016-05-23 VITALS — BP 162/97 | HR 81 | Temp 98.7°F | Ht 70.0 in | Wt 156.4 lb

## 2016-05-23 DIAGNOSIS — K449 Diaphragmatic hernia without obstruction or gangrene: Secondary | ICD-10-CM

## 2016-05-23 DIAGNOSIS — Z597 Insufficient social insurance and welfare support: Secondary | ICD-10-CM

## 2016-05-23 DIAGNOSIS — F1010 Alcohol abuse, uncomplicated: Secondary | ICD-10-CM

## 2016-05-23 DIAGNOSIS — Z79899 Other long term (current) drug therapy: Secondary | ICD-10-CM

## 2016-05-23 DIAGNOSIS — Z7982 Long term (current) use of aspirin: Secondary | ICD-10-CM

## 2016-05-23 DIAGNOSIS — E785 Hyperlipidemia, unspecified: Secondary | ICD-10-CM

## 2016-05-23 DIAGNOSIS — R1013 Epigastric pain: Secondary | ICD-10-CM

## 2016-05-23 DIAGNOSIS — Z590 Homelessness: Secondary | ICD-10-CM

## 2016-05-23 DIAGNOSIS — I10 Essential (primary) hypertension: Secondary | ICD-10-CM

## 2016-05-23 DIAGNOSIS — R112 Nausea with vomiting, unspecified: Secondary | ICD-10-CM

## 2016-05-23 DIAGNOSIS — K209 Esophagitis, unspecified without bleeding: Secondary | ICD-10-CM

## 2016-05-23 LAB — LIPASE, BLOOD: Lipase: 36 U/L (ref 11–51)

## 2016-05-23 LAB — COMPREHENSIVE METABOLIC PANEL
ALBUMIN: 3.4 g/dL — AB (ref 3.5–5.0)
ALK PHOS: 74 U/L (ref 38–126)
ALT: 32 U/L (ref 17–63)
ANION GAP: 11 (ref 5–15)
AST: 48 U/L — ABNORMAL HIGH (ref 15–41)
BILIRUBIN TOTAL: 0.6 mg/dL (ref 0.3–1.2)
BUN: 13 mg/dL (ref 6–20)
CALCIUM: 8.7 mg/dL — AB (ref 8.9–10.3)
CO2: 22 mmol/L (ref 22–32)
Chloride: 108 mmol/L (ref 101–111)
Creatinine, Ser: 1.33 mg/dL — ABNORMAL HIGH (ref 0.61–1.24)
GFR calc non Af Amer: 59 mL/min — ABNORMAL LOW (ref >60)
GLUCOSE: 138 mg/dL — AB (ref 65–99)
POTASSIUM: 3.6 mmol/L (ref 3.5–5.1)
SODIUM: 141 mmol/L (ref 135–145)
TOTAL PROTEIN: 6.8 g/dL (ref 6.5–8.1)

## 2016-05-23 LAB — I-STAT TROPONIN, ED: TROPONIN I, POC: 0 ng/mL (ref 0.00–0.08)

## 2016-05-23 MED ORDER — GI COCKTAIL ~~LOC~~
30.0000 mL | Freq: Once | ORAL | Status: AC
  Administered 2016-05-23: 30 mL via ORAL
  Filled 2016-05-23: qty 30

## 2016-05-23 MED ORDER — THIAMINE HCL 100 MG/ML IJ SOLN
100.0000 mg | Freq: Once | INTRAMUSCULAR | Status: AC
  Administered 2016-05-23: 100 mg via INTRAVENOUS
  Filled 2016-05-23: qty 2

## 2016-05-23 MED ORDER — SODIUM CHLORIDE 0.9 % IV BOLUS (SEPSIS)
1000.0000 mL | Freq: Once | INTRAVENOUS | Status: AC
  Administered 2016-05-23: 1000 mL via INTRAVENOUS

## 2016-05-23 MED ORDER — LORAZEPAM 2 MG/ML IJ SOLN
0.5000 mg | Freq: Once | INTRAMUSCULAR | Status: AC
  Administered 2016-05-23: 0.5 mg via INTRAMUSCULAR
  Filled 2016-05-23: qty 1

## 2016-05-23 MED ORDER — ATORVASTATIN CALCIUM 40 MG PO TABS
40.0000 mg | ORAL_TABLET | Freq: Every day | ORAL | Status: DC
  Filled 2016-05-23: qty 1

## 2016-05-23 MED ORDER — ATENOLOL 50 MG PO TABS
100.0000 mg | ORAL_TABLET | Freq: Every day | ORAL | Status: DC
  Administered 2016-05-23: 100 mg via ORAL
  Filled 2016-05-23: qty 2

## 2016-05-23 MED ORDER — PANTOPRAZOLE SODIUM 40 MG PO TBEC
40.0000 mg | DELAYED_RELEASE_TABLET | Freq: Every day | ORAL | Status: DC
  Administered 2016-05-23: 40 mg via ORAL
  Filled 2016-05-23: qty 1

## 2016-05-23 MED ORDER — ATORVASTATIN CALCIUM 40 MG PO TABS: 40.0000 mg | ORAL_TABLET | Freq: Every day | ORAL | 3 refills | Status: DC

## 2016-05-23 MED ORDER — SUCRALFATE 1 G PO TABS: 1.0000 g | ORAL_TABLET | Freq: Three times a day (TID) | ORAL | 0 refills | Status: DC

## 2016-05-23 MED ORDER — ACETAMINOPHEN 500 MG PO TABS: 1000.0000 mg | ORAL_TABLET | Freq: Once | ORAL | Status: DC

## 2016-05-23 MED ORDER — MECLIZINE HCL 25 MG PO TABS: 25.0000 mg | ORAL_TABLET | Freq: Three times a day (TID) | ORAL | 0 refills | Status: DC | PRN
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
## 2016-05-23 NOTE — ED Notes (Signed)
Pt given bus pass, discharge paperwork, and prescriptions. Pt verbalized understanding.

## 2016-05-23 NOTE — Patient Instructions (Addendum)
[name],   I would like you to continue the Protonix 40 mg twice a day.  Please avoid any further alcohol use or any NSAID use. This includes Ibuprofen, Advil, Aleve, Naproxen, goody power.   We are working with our pharmacist to try and get you set up to get your medications.

## 2016-05-23 NOTE — Discharge Instructions (Signed)
HOME CARE INSTRUCTIONS  · Only take over-the-counter or prescription medicines as directed by your caregiver.  · If you were given antibiotic medicines, take them as directed. Finish them even if you start to feel better.  · Drink enough fluids to keep your urine clear or pale yellow.  · Avoid foods and drinks that make your symptoms worse, such as:  ¨ Caffeine or alcoholic drinks.  ¨ Chocolate.  ¨ Peppermint or mint flavorings.  ¨ Garlic and onions.  ¨ Spicy foods.  ¨ Citrus fruits, such as oranges, lemons, or limes.  ¨ Tomato-based foods such as sauce, chili, salsa, and pizza.  ¨ Fried and fatty foods.  · Eat small, frequent meals instead of large meals.  SEEK IMMEDIATE MEDICAL CARE IF:   · You have black or dark red stools.  · You vomit blood or material that looks like coffee grounds.  · You are unable to keep fluids down.  · Your abdominal pain gets worse.  · You have a fever.  · You do not feel better after 1 week.  · You have any other questions or concerns.
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
## 2016-05-23 NOTE — ED Notes (Signed)
Patient transported to X-ray 

## 2016-05-23 NOTE — ED Provider Notes (Signed)
Complains of diffuse abdominal pain onset yesterday. He reports she's vomited 5-6 times since yesterday no fever. Pain is worse with vomiting. Last bowel movement was yesterday. He did eat hamburger and fries yesterday. Last alcohol intake was 3 days ago. He drinks alcohol every 2 weeks. Denies chest pain denies shortness of breath. Nothing makes pain better. No treatment prior to coming here. On exam alert Glasgow Coma Score 15 lungs clear auscultation heart regular rate and rhythm abdomen nondistended normoactive bowel sounds tender left upper quadrant and left lower quadrant no guarding rigidity or rebound. Genitalia normal male extremities without edema   Orlie Dakin, MD 05/23/16 1659

## 2016-05-24 ENCOUNTER — Emergency Department (HOSPITAL_COMMUNITY)
Admission: EM | Admit: 2016-05-24 | Discharge: 2016-05-24 | Disposition: A | Discharge status: Home / Self Care | Payer: Self-pay | Attending: Emergency Medicine | Admitting: Emergency Medicine

## 2016-05-24 ENCOUNTER — Encounter (HOSPITAL_COMMUNITY): Payer: Self-pay | Admitting: Emergency Medicine

## 2016-05-24 ENCOUNTER — Emergency Department (HOSPITAL_COMMUNITY): Payer: Self-pay

## 2016-05-24 DIAGNOSIS — M25432 Effusion, left wrist: Secondary | ICD-10-CM

## 2016-05-24 DIAGNOSIS — Z7982 Long term (current) use of aspirin: Secondary | ICD-10-CM | POA: Insufficient documentation

## 2016-05-24 DIAGNOSIS — Z5900 Homelessness unspecified: Secondary | ICD-10-CM

## 2016-05-24 DIAGNOSIS — I10 Essential (primary) hypertension: Secondary | ICD-10-CM | POA: Insufficient documentation

## 2016-05-24 DIAGNOSIS — M25532 Pain in left wrist: Secondary | ICD-10-CM

## 2016-05-24 DIAGNOSIS — I2510 Atherosclerotic heart disease of native coronary artery without angina pectoris: Secondary | ICD-10-CM | POA: Insufficient documentation

## 2016-05-24 DIAGNOSIS — Z79899 Other long term (current) drug therapy: Secondary | ICD-10-CM | POA: Insufficient documentation

## 2016-05-24 DIAGNOSIS — F17210 Nicotine dependence, cigarettes, uncomplicated: Secondary | ICD-10-CM | POA: Insufficient documentation

## 2016-05-24 DIAGNOSIS — M7989 Other specified soft tissue disorders: Secondary | ICD-10-CM | POA: Insufficient documentation

## 2016-05-24 MED ORDER — PREDNISONE 20 MG PO TABS: ORAL_TABLET | ORAL | 0 refills | Status: DC

## 2016-05-24 MED ORDER — PREDNISONE 20 MG PO TABS
60.0000 mg | ORAL_TABLET | Freq: Once | ORAL | Status: AC
  Administered 2016-05-24: 60 mg via ORAL
  Filled 2016-05-24: qty 3

## 2016-05-24 NOTE — Assessment & Plan Note (Signed)
BP Readings from Last 3 Encounters:  05/24/16 142/98  05/23/16 (!) 162/97  05/23/16 143/91    Lab Results  Component Value Date   NA 141 05/22/2016   K 3.6 05/22/2016   CREATININE 1.33 (H) 05/22/2016   BP uncontrolled today. Currently only on atenolol 100 mg daily. Denies any headaches, vision changes, dizziness/lightheadedness.  Assessment: Uncontrolled HTN  Plan: Will continue atenolol 100 mg daily.  Would avoid HCTZ given gout history.  Patient reports unable to afford any medications currently. Discussed with Dr. Maudie Mercury and enrolling in [facility] program. Will not start any further medications today until able to ensure patient will be able to be compliant.  RTC in 2-4 weeks.

## 2016-05-24 NOTE — Assessment & Plan Note (Signed)
Devin [name] was recently admitted from 1/10-1/12 with substernal chest pain. He was ruled out for ACS with unremarkable EKG and negative troponins. Had a low risk stress test in 12/2014. He underwent EGD this hospitalization which reveals severe esophagitis in his distal esophagus as well as a small hiatal hernia. He was started on Protonix 40 mg bid at this time.   He has been seen in the ED the past 2 days with complaints of chest pain and nausea/vomiting.   Today he reports vomiting with meals that began last night. Says that every time he eats he will vomiting. Having burning pain in his abdomen and nausea but no emesis between meals. Says that his vomit is mostly undigested food but has had episodes of yellowish vomit. Does report dysphagia and odnynopahgia to solids only. Denies any symptoms with liquids. Symptoms are unchanged from hospitalization. Reports compliance with protonix 40 mg bid, atenolol 100 mg daily, Lipitor 40 mg daily and asa 81 mg daily. Reports taking ibuprofen occasionally.   Admits to a can of beer and a couple of shots of liquid with his friends 3 days ago. Had burning and pain with this and says he has not had any since. Denies any withdrawal symptoms.   Assessment: Severe esophagitis in the setting of alcohol abuse and NSAID use  Plan Will continue the Protonix 40 mg bid Explained NSAIDs and advised avoidance Encouraged ETOH cessation Patient has no insurance and is homeless. Obtaining medications is difficult. Spoke with Dr. Maudie Mercury and working on enrolling in [employer].

## 2016-05-24 NOTE — Discharge Instructions (Signed)
Wear the splint as needed.
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
## 2016-05-24 NOTE — Assessment & Plan Note (Signed)
Reports compliance with Lipitor 40 mg daily. Will continue today.

## 2016-05-24 NOTE — ED Triage Notes (Signed)
Pt. reports multiple complaints : left hand pain / low back pain , cough , chest congestion and homelessness . Denies fever or chills .

## 2016-05-25 NOTE — Progress Notes (Signed)
Case discussed with Dr. Boswell at the time of the visit.  We reviewed the resident's history and exam and pertinent patient test results.  I agree with the assessment, diagnosis and plan of care documented in the resident's note. 

## 2016-05-26 NOTE — Telephone Encounter (Signed)
Transition Care Management Follow-up Telephone Call   Date discharged? 1.12.18   How have you been since you were released from the hospital? Yes and also been to ED.   Do you understand why you were in the hospital? Yes  Do you understand the discharge instructions? Yes  Where were you discharged to? Shelters are full today.   Items Reviewed:  Medications reviewed: yes  Allergies reviewed: nka  Dietary changes review:   Referrals reviewed:    Functional Questionnaire:   Activities of Daily Living (ADLs):   He states they are independent in the following: yes States they require assistance with the following:   Any transportation issues/concerns?: yes   Any patient concerns? Waiting on medicaid card.   Confirmed importance and date/time of follow-up visits scheduled  Provider Appointment booked with - Saw Dr Charlynn Grimes on 1.23.18. Confirmed with patient if condition begins to worsen call PCP or go to the ER.  Patient was given the office number and encouraged to call back with question or concerns.  : yes

## 2016-05-31 ENCOUNTER — Emergency Department (HOSPITAL_COMMUNITY)
Admission: EM | Admit: 2016-05-31 | Discharge: 2016-06-01 | Disposition: A | Discharge status: Home / Self Care | Payer: Self-pay | Attending: Emergency Medicine | Admitting: Emergency Medicine

## 2016-05-31 ENCOUNTER — Encounter (HOSPITAL_COMMUNITY): Payer: Self-pay

## 2016-05-31 DIAGNOSIS — M549 Dorsalgia, unspecified: Secondary | ICD-10-CM

## 2016-05-31 DIAGNOSIS — M546 Pain in thoracic spine: Secondary | ICD-10-CM | POA: Insufficient documentation

## 2016-05-31 DIAGNOSIS — F17210 Nicotine dependence, cigarettes, uncomplicated: Secondary | ICD-10-CM | POA: Insufficient documentation

## 2016-05-31 DIAGNOSIS — R059 Cough, unspecified: Secondary | ICD-10-CM

## 2016-05-31 DIAGNOSIS — I10 Essential (primary) hypertension: Secondary | ICD-10-CM | POA: Insufficient documentation

## 2016-05-31 DIAGNOSIS — R05 Cough: Secondary | ICD-10-CM | POA: Insufficient documentation

## 2016-05-31 DIAGNOSIS — R0789 Other chest pain: Secondary | ICD-10-CM | POA: Insufficient documentation

## 2016-05-31 DIAGNOSIS — G8929 Other chronic pain: Secondary | ICD-10-CM | POA: Insufficient documentation

## 2016-05-31 DIAGNOSIS — Z7982 Long term (current) use of aspirin: Secondary | ICD-10-CM | POA: Insufficient documentation

## 2016-05-31 DIAGNOSIS — I252 Old myocardial infarction: Secondary | ICD-10-CM | POA: Insufficient documentation

## 2016-05-31 NOTE — ED Triage Notes (Signed)
Pt reports productive cough of yellow phlegm, onset a couple of weeks ago. He also reports back pain worse when he coughs.

## 2016-05-31 NOTE — ED Notes (Signed)
Rounded on lobby.  Offered warm blankets and updates.   

## 2016-06-01 ENCOUNTER — Emergency Department (HOSPITAL_COMMUNITY): Payer: Self-pay

## 2016-06-01 ENCOUNTER — Other Ambulatory Visit: Payer: Self-pay

## 2016-06-01 LAB — BASIC METABOLIC PANEL
Anion gap: 13 (ref 5–15)
BUN: 15 mg/dL (ref 6–20)
CALCIUM: 8.7 mg/dL — AB (ref 8.9–10.3)
CHLORIDE: 108 mmol/L (ref 101–111)
CO2: 21 mmol/L — ABNORMAL LOW (ref 22–32)
CREATININE: 0.95 mg/dL (ref 0.61–1.24)
GFR calc non Af Amer: >60 mL/min (ref >60)
Glucose, Bld: 77 mg/dL (ref 65–99)
Potassium: 3.8 mmol/L (ref 3.5–5.1)
SODIUM: 142 mmol/L (ref 135–145)

## 2016-06-01 LAB — CBC WITH DIFFERENTIAL/PLATELET
BASOS PCT: 0 %
Basophils Absolute: 0 10*3/uL (ref 0.0–0.1)
EOS ABS: 0.1 10*3/uL (ref 0.0–0.7)
Eosinophils Relative: 3 %
HCT: 31.2 % — ABNORMAL LOW (ref 39.0–52.0)
HEMOGLOBIN: 10.2 g/dL — AB (ref 13.0–17.0)
LYMPHS ABS: 1.6 10*3/uL (ref 0.7–4.0)
Lymphocytes Relative: 44 %
MCH: 32.7 pg (ref 26.0–34.0)
MCHC: 32.7 g/dL (ref 30.0–36.0)
MCV: 100 fL (ref 78.0–100.0)
MONOS PCT: 10 %
Monocytes Absolute: 0.4 10*3/uL (ref 0.1–1.0)
NEUTROS PCT: 43 %
Neutro Abs: 1.6 10*3/uL — ABNORMAL LOW (ref 1.7–7.7)
Platelets: 222 10*3/uL (ref 150–400)
RBC: 3.12 MIL/uL — ABNORMAL LOW (ref 4.22–5.81)
RDW: 15 % (ref 11.5–15.5)
WBC: 3.6 10*3/uL — ABNORMAL LOW (ref 4.0–10.5)

## 2016-06-01 LAB — I-STAT TROPONIN, ED: TROPONIN I, POC: 0 ng/mL (ref 0.00–0.08)

## 2016-06-01 LAB — HIV ANTIBODY (ROUTINE TESTING W REFLEX): HIV Screen 4th Generation wRfx: NONREACTIVE

## 2016-06-01 LAB — RPR: RPR Ser Ql: NONREACTIVE

## 2016-06-01 MED ORDER — IPRATROPIUM-ALBUTEROL 0.5-2.5 (3) MG/3ML IN SOLN
3.0000 mL | Freq: Once | RESPIRATORY_TRACT | Status: AC
Start: 2016-06-01 — End: 2016-06-01
  Administered 2016-06-01: 3 mL via RESPIRATORY_TRACT
  Filled 2016-06-01: qty 3

## 2016-06-01 MED ORDER — IBUPROFEN 800 MG PO TABS
800.0000 mg | ORAL_TABLET | Freq: Once | ORAL | Status: AC
Start: 2016-06-01 — End: 2016-06-01
  Administered 2016-06-01: 800 mg via ORAL
  Filled 2016-06-01: qty 1

## 2016-06-01 MED ORDER — GUAIFENESIN ER 600 MG PO TB12
600.0000 mg | ORAL_TABLET | Freq: Once | ORAL | Status: AC
  Administered 2016-06-01: 600 mg via ORAL
  Filled 2016-06-01: qty 1

## 2016-06-01 MED ORDER — BENZONATATE 100 MG PO CAPS: 100.0000 mg | ORAL_CAPSULE | Freq: Three times a day (TID) | ORAL | 0 refills | Status: DC | PRN

## 2016-06-01 MED ORDER — BENZONATATE 100 MG PO CAPS
100.0000 mg | ORAL_CAPSULE | Freq: Once | ORAL | Status: AC
  Administered 2016-06-01: 100 mg via ORAL
  Filled 2016-06-01: qty 1

## 2016-06-01 MED ORDER — GUAIFENESIN 100 MG/5ML PO LIQD: 100.0000 mg | ORAL | 0 refills | Status: DC | PRN

## 2016-06-01 NOTE — Discharge Instructions (Signed)
You may alternate between Tylenol 1000 mg every 6 hours as needed for pain and ibuprofen 800 mg every 8 hours as needed for pain. Both of these medications are found over-the-counter.   To find a primary care or specialty doctor please call [phone] or [phone] to access "[facility] Doctor Service."  You may also go on the [facility] website at [email or web address]  There are also multiple [facility], [facility], [facility] and [facility] practices throughout the [place] that are frequently accepting new patients. You may find a clinic that is close to your home and contact them.  [facility] [phone] [place]  [place] [address] [address] [phone]

## 2016-06-01 NOTE — ED Notes (Signed)
Pt here for a cough that has been going on for 2 weeks.

## 2016-06-01 NOTE — ED Notes (Signed)
Connected pt back to monitor.
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
## 2016-06-02 ENCOUNTER — Encounter (HOSPITAL_COMMUNITY): Payer: Self-pay | Admitting: Emergency Medicine

## 2016-06-02 ENCOUNTER — Emergency Department (HOSPITAL_COMMUNITY)
Admission: EM | Admit: 2016-06-02 | Discharge: 2016-06-03 | Disposition: A | Discharge status: Home / Self Care | Payer: Self-pay | Attending: Emergency Medicine | Admitting: Emergency Medicine

## 2016-06-02 DIAGNOSIS — I10 Essential (primary) hypertension: Secondary | ICD-10-CM | POA: Insufficient documentation

## 2016-06-02 DIAGNOSIS — J209 Acute bronchitis, unspecified: Secondary | ICD-10-CM | POA: Insufficient documentation

## 2016-06-02 DIAGNOSIS — Z7982 Long term (current) use of aspirin: Secondary | ICD-10-CM | POA: Insufficient documentation

## 2016-06-02 DIAGNOSIS — F17210 Nicotine dependence, cigarettes, uncomplicated: Secondary | ICD-10-CM | POA: Insufficient documentation

## 2016-06-02 DIAGNOSIS — J40 Bronchitis, not specified as acute or chronic: Secondary | ICD-10-CM

## 2016-06-02 DIAGNOSIS — I252 Old myocardial infarction: Secondary | ICD-10-CM | POA: Insufficient documentation

## 2016-06-02 MED ORDER — AEROCHAMBER PLUS W/MASK MISC
1.0000 | Freq: Once | Status: AC
  Administered 2016-06-03: 1
  Filled 2016-06-02: qty 1

## 2016-06-02 MED ORDER — ALBUTEROL SULFATE HFA 108 (90 BASE) MCG/ACT IN AERS
2.0000 | INHALATION_SPRAY | RESPIRATORY_TRACT | Status: DC | PRN
  Administered 2016-06-03: 2 via RESPIRATORY_TRACT
  Filled 2016-06-02: qty 6.7

## 2016-06-02 NOTE — ED Triage Notes (Signed)
Pt presents with dizziness and lightheadedness x 2 months; pt states "my family has cancer and I just need to be checked"; pt also here for productive cough with yellow phlem

## 2016-06-03 ENCOUNTER — Encounter (HOSPITAL_COMMUNITY): Payer: Self-pay

## 2016-06-03 ENCOUNTER — Emergency Department (HOSPITAL_COMMUNITY)
Admission: EM | Admit: 2016-06-03 | Discharge: 2016-06-03 | Disposition: A | Discharge status: Home / Self Care | Payer: Self-pay | Attending: Emergency Medicine | Admitting: Emergency Medicine

## 2016-06-03 DIAGNOSIS — Z79899 Other long term (current) drug therapy: Secondary | ICD-10-CM | POA: Insufficient documentation

## 2016-06-03 DIAGNOSIS — M545 Low back pain: Secondary | ICD-10-CM | POA: Insufficient documentation

## 2016-06-03 DIAGNOSIS — I10 Essential (primary) hypertension: Secondary | ICD-10-CM | POA: Insufficient documentation

## 2016-06-03 DIAGNOSIS — G8929 Other chronic pain: Secondary | ICD-10-CM | POA: Insufficient documentation

## 2016-06-03 DIAGNOSIS — Z7982 Long term (current) use of aspirin: Secondary | ICD-10-CM | POA: Insufficient documentation

## 2016-06-03 DIAGNOSIS — F17210 Nicotine dependence, cigarettes, uncomplicated: Secondary | ICD-10-CM | POA: Insufficient documentation

## 2016-06-03 DIAGNOSIS — I252 Old myocardial infarction: Secondary | ICD-10-CM | POA: Insufficient documentation

## 2016-06-03 DIAGNOSIS — I2510 Atherosclerotic heart disease of native coronary artery without angina pectoris: Secondary | ICD-10-CM | POA: Insufficient documentation

## 2016-06-03 MED ORDER — CYCLOBENZAPRINE HCL 10 MG PO TABS
5.0000 mg | ORAL_TABLET | Freq: Once | ORAL | Status: AC
  Administered 2016-06-03: 5 mg via ORAL
  Filled 2016-06-03: qty 1

## 2016-06-03 MED ORDER — CYCLOBENZAPRINE HCL 10 MG PO TABS: 10.0000 mg | ORAL_TABLET | Freq: Two times a day (BID) | ORAL | 0 refills | Status: DC | PRN

## 2016-06-03 NOTE — Discharge Instructions (Signed)
Use your albuterol inhaler 2 puffs every 4 hours as needed for cough or shortness of breath. Take Tylenol as directed as needed for aches. Call the [facility] at [phone] if not feeling better by next week

## 2016-06-03 NOTE — ED Triage Notes (Signed)
Pt endorses lower back pain that began this morning. VSS. Pt ambulatory. Pt is homeless.
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
## 2016-06-03 NOTE — ED Notes (Signed)
Pt verbalized understanding discharge instructions and denies any further needs or questions at this time. VS stable, ambulatory and steady gait.   

## 2016-06-03 NOTE — Discharge Instructions (Signed)
Call your doctor for follow up. Return here as needed.

## 2016-06-04 ENCOUNTER — Emergency Department (HOSPITAL_COMMUNITY): Payer: Self-pay

## 2016-06-04 ENCOUNTER — Encounter (HOSPITAL_COMMUNITY): Payer: Self-pay

## 2016-06-04 ENCOUNTER — Emergency Department (HOSPITAL_COMMUNITY)
Admission: EM | Admit: 2016-06-04 | Discharge: 2016-06-05 | Disposition: A | Discharge status: Home / Self Care | Payer: Self-pay | Attending: Emergency Medicine | Admitting: Emergency Medicine

## 2016-06-04 DIAGNOSIS — Z7982 Long term (current) use of aspirin: Secondary | ICD-10-CM | POA: Insufficient documentation

## 2016-06-04 DIAGNOSIS — Z79899 Other long term (current) drug therapy: Secondary | ICD-10-CM | POA: Insufficient documentation

## 2016-06-04 DIAGNOSIS — I10 Essential (primary) hypertension: Secondary | ICD-10-CM | POA: Insufficient documentation

## 2016-06-04 DIAGNOSIS — R740 Nonspecific elevation of levels of transaminase and lactic acid dehydrogenase [LDH]: Secondary | ICD-10-CM | POA: Insufficient documentation

## 2016-06-04 DIAGNOSIS — K648 Other hemorrhoids: Secondary | ICD-10-CM | POA: Insufficient documentation

## 2016-06-04 DIAGNOSIS — E876 Hypokalemia: Secondary | ICD-10-CM | POA: Insufficient documentation

## 2016-06-04 DIAGNOSIS — R059 Cough, unspecified: Secondary | ICD-10-CM

## 2016-06-04 DIAGNOSIS — I252 Old myocardial infarction: Secondary | ICD-10-CM | POA: Insufficient documentation

## 2016-06-04 DIAGNOSIS — R7401 Elevation of levels of liver transaminase levels: Secondary | ICD-10-CM

## 2016-06-04 DIAGNOSIS — K644 Residual hemorrhoidal skin tags: Secondary | ICD-10-CM | POA: Insufficient documentation

## 2016-06-04 DIAGNOSIS — F17210 Nicotine dependence, cigarettes, uncomplicated: Secondary | ICD-10-CM | POA: Insufficient documentation

## 2016-06-04 DIAGNOSIS — D649 Anemia, unspecified: Secondary | ICD-10-CM | POA: Insufficient documentation

## 2016-06-04 DIAGNOSIS — R05 Cough: Secondary | ICD-10-CM

## 2016-06-04 DIAGNOSIS — I2510 Atherosclerotic heart disease of native coronary artery without angina pectoris: Secondary | ICD-10-CM | POA: Insufficient documentation

## 2016-06-04 LAB — COMPREHENSIVE METABOLIC PANEL
ALK PHOS: 104 U/L (ref 38–126)
ALT: 36 U/L (ref 17–63)
ANION GAP: 16 — AB (ref 5–15)
AST: 89 U/L — ABNORMAL HIGH (ref 15–41)
Albumin: 3.6 g/dL (ref 3.5–5.0)
BILIRUBIN TOTAL: 0.5 mg/dL (ref 0.3–1.2)
BUN: 10 mg/dL (ref 6–20)
CALCIUM: 9 mg/dL (ref 8.9–10.3)
CO2: 18 mmol/L — ABNORMAL LOW (ref 22–32)
CREATININE: 1.04 mg/dL (ref 0.61–1.24)
Chloride: 106 mmol/L (ref 101–111)
GFR calc non Af Amer: >60 mL/min (ref >60)
Glucose, Bld: 97 mg/dL (ref 65–99)
Potassium: 3.1 mmol/L — ABNORMAL LOW (ref 3.5–5.1)
Sodium: 140 mmol/L (ref 135–145)
TOTAL PROTEIN: 7.3 g/dL (ref 6.5–8.1)

## 2016-06-04 LAB — CBC
HCT: 34.3 % — ABNORMAL LOW (ref 39.0–52.0)
HEMOGLOBIN: 11.3 g/dL — AB (ref 13.0–17.0)
MCH: 32.4 pg (ref 26.0–34.0)
MCHC: 32.9 g/dL (ref 30.0–36.0)
MCV: 98.3 fL (ref 78.0–100.0)
PLATELETS: 205 10*3/uL (ref 150–400)
RBC: 3.49 MIL/uL — AB (ref 4.22–5.81)
RDW: 14.2 % (ref 11.5–15.5)
WBC: 4.8 10*3/uL (ref 4.0–10.5)

## 2016-06-04 LAB — ABO/RH: ABO/RH(D): O POS

## 2016-06-04 MED ORDER — POTASSIUM CHLORIDE CRYS ER 20 MEQ PO TBCR
40.0000 meq | EXTENDED_RELEASE_TABLET | Freq: Once | ORAL | Status: AC
  Administered 2016-06-05: 40 meq via ORAL
  Filled 2016-06-04: qty 2

## 2016-06-04 NOTE — ED Triage Notes (Signed)
Pt states that he has been SOB for a long time, pt states that he has productive cough, yellow phlegm, states albuterol is not helping, pt also c/o of hemorrhoids and rectal bleeding every time he has a BM
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
## 2016-06-05 ENCOUNTER — Ambulatory Visit: Payer: Self-pay | Admitting: Pharmacist

## 2016-06-05 LAB — POC OCCULT BLOOD, ED: Fecal Occult Bld: POSITIVE — AB

## 2016-06-05 LAB — TYPE AND SCREEN
ABO/RH(D): O POS
ANTIBODY SCREEN: NEGATIVE

## 2016-06-05 MED ORDER — ATENOLOL 100 MG PO TABS: 100.0000 mg | ORAL_TABLET | Freq: Every day | ORAL | 3 refills | Status: DC

## 2016-06-05 MED ORDER — ASPIRIN EC 81 MG PO TBEC: 81.0000 mg | DELAYED_RELEASE_TABLET | Freq: Every day | ORAL | 5 refills | Status: DC

## 2016-06-05 MED ORDER — HYDROCORTISONE ACETATE 25 MG RE SUPP: 25.0000 mg | Freq: Two times a day (BID) | RECTAL | 0 refills | Status: DC

## 2016-06-05 MED ORDER — AMOXICILLIN 500 MG PO CAPS
500.0000 mg | ORAL_CAPSULE | Freq: Once | ORAL | Status: AC
  Administered 2016-06-05: 500 mg via ORAL
  Filled 2016-06-05: qty 1

## 2016-06-05 MED ORDER — CYCLOBENZAPRINE HCL 10 MG PO TABS: 10.0000 mg | ORAL_TABLET | Freq: Two times a day (BID) | ORAL | 0 refills | Status: DC | PRN

## 2016-06-05 MED ORDER — AMOXICILLIN 500 MG PO CAPS: 500.0000 mg | ORAL_CAPSULE | Freq: Three times a day (TID) | ORAL | 0 refills | Status: DC

## 2016-06-05 MED ORDER — OMEPRAZOLE 20 MG PO CPDR: 20.0000 mg | DELAYED_RELEASE_CAPSULE | Freq: Two times a day (BID) | ORAL | 3 refills | Status: DC

## 2016-06-05 MED ORDER — ATORVASTATIN CALCIUM 40 MG PO TABS: 40.0000 mg | ORAL_TABLET | Freq: Every day | ORAL | 3 refills | Status: DC
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
## 2016-06-17 ENCOUNTER — Emergency Department (HOSPITAL_COMMUNITY)
Admission: EM | Admit: 2016-06-17 | Discharge: 2016-06-18 | Disposition: A | Discharge status: Home / Self Care | Payer: Self-pay | Attending: Emergency Medicine | Admitting: Emergency Medicine

## 2016-06-17 ENCOUNTER — Encounter (HOSPITAL_COMMUNITY): Payer: Self-pay | Admitting: *Deleted

## 2016-06-17 DIAGNOSIS — I252 Old myocardial infarction: Secondary | ICD-10-CM | POA: Insufficient documentation

## 2016-06-17 DIAGNOSIS — F17210 Nicotine dependence, cigarettes, uncomplicated: Secondary | ICD-10-CM | POA: Insufficient documentation

## 2016-06-17 DIAGNOSIS — I2510 Atherosclerotic heart disease of native coronary artery without angina pectoris: Secondary | ICD-10-CM | POA: Insufficient documentation

## 2016-06-17 DIAGNOSIS — F10120 Alcohol abuse with intoxication, uncomplicated: Secondary | ICD-10-CM | POA: Insufficient documentation

## 2016-06-17 DIAGNOSIS — Z7982 Long term (current) use of aspirin: Secondary | ICD-10-CM | POA: Insufficient documentation

## 2016-06-17 DIAGNOSIS — F10920 Alcohol use, unspecified with intoxication, uncomplicated: Secondary | ICD-10-CM

## 2016-06-17 DIAGNOSIS — I10 Essential (primary) hypertension: Secondary | ICD-10-CM | POA: Insufficient documentation

## 2016-06-17 LAB — COMPREHENSIVE METABOLIC PANEL
ALBUMIN: 3.6 g/dL (ref 3.5–5.0)
ALK PHOS: 108 U/L (ref 38–126)
ALT: 39 U/L (ref 17–63)
ANION GAP: 9 (ref 5–15)
AST: 70 U/L — ABNORMAL HIGH (ref 15–41)
BILIRUBIN TOTAL: 0.7 mg/dL (ref 0.3–1.2)
BUN: 13 mg/dL (ref 6–20)
CALCIUM: 8.8 mg/dL — AB (ref 8.9–10.3)
CO2: 22 mmol/L (ref 22–32)
CREATININE: 0.96 mg/dL (ref 0.61–1.24)
Chloride: 115 mmol/L — ABNORMAL HIGH (ref 101–111)
GFR calc Af Amer: >60 mL/min (ref >60)
GFR calc non Af Amer: >60 mL/min (ref >60)
Glucose, Bld: 79 mg/dL (ref 65–99)
Potassium: 3.3 mmol/L — ABNORMAL LOW (ref 3.5–5.1)
Sodium: 146 mmol/L — ABNORMAL HIGH (ref 135–145)
TOTAL PROTEIN: 7.4 g/dL (ref 6.5–8.1)

## 2016-06-17 LAB — CBC WITH DIFFERENTIAL/PLATELET
Basophils Absolute: 0 10*3/uL (ref 0.0–0.1)
Basophils Relative: 0 %
EOS PCT: 2 %
Eosinophils Absolute: 0.1 10*3/uL (ref 0.0–0.7)
HCT: 34.5 % — ABNORMAL LOW (ref 39.0–52.0)
Hemoglobin: 11.5 g/dL — ABNORMAL LOW (ref 13.0–17.0)
LYMPHS ABS: 1.5 10*3/uL (ref 0.7–4.0)
Lymphocytes Relative: 32 %
MCH: 33 pg (ref 26.0–34.0)
MCHC: 33.3 g/dL (ref 30.0–36.0)
MCV: 99.1 fL (ref 78.0–100.0)
MONO ABS: 0.4 10*3/uL (ref 0.1–1.0)
Monocytes Relative: 8 %
Neutro Abs: 2.8 10*3/uL (ref 1.7–7.7)
Neutrophils Relative %: 58 %
PLATELETS: 311 10*3/uL (ref 150–400)
RBC: 3.48 MIL/uL — ABNORMAL LOW (ref 4.22–5.81)
RDW: 14.8 % (ref 11.5–15.5)
WBC: 4.8 10*3/uL (ref 4.0–10.5)

## 2016-06-17 LAB — CBG MONITORING, ED: Glucose-Capillary: 98 mg/dL (ref 65–99)

## 2016-06-17 LAB — ETHANOL: Alcohol, Ethyl (B): 448 mg/dL (ref <5)

## 2016-06-17 NOTE — ED Notes (Signed)
Pt was given 2% milk and [place] sandwich for complaint of "being hungry"---- pt tolerating well.

## 2016-06-17 NOTE — ED Notes (Signed)
Bed: [id number] Expected date:  Expected time:  Means of arrival:  Comments: 56 y/o "heavy" ETOH
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
## 2016-07-08 ENCOUNTER — Emergency Department (HOSPITAL_COMMUNITY)
Admission: EM | Admit: 2016-07-08 | Discharge: 2016-07-08 | Disposition: A | Discharge status: Home / Self Care | Payer: Self-pay | Attending: Emergency Medicine | Admitting: Emergency Medicine

## 2016-07-08 ENCOUNTER — Encounter (HOSPITAL_COMMUNITY): Payer: Self-pay | Admitting: *Deleted

## 2016-07-08 ENCOUNTER — Emergency Department (HOSPITAL_COMMUNITY): Payer: Self-pay

## 2016-07-08 DIAGNOSIS — I2510 Atherosclerotic heart disease of native coronary artery without angina pectoris: Secondary | ICD-10-CM | POA: Insufficient documentation

## 2016-07-08 DIAGNOSIS — Z79899 Other long term (current) drug therapy: Secondary | ICD-10-CM | POA: Insufficient documentation

## 2016-07-08 DIAGNOSIS — R0789 Other chest pain: Secondary | ICD-10-CM | POA: Insufficient documentation

## 2016-07-08 DIAGNOSIS — F17210 Nicotine dependence, cigarettes, uncomplicated: Secondary | ICD-10-CM | POA: Insufficient documentation

## 2016-07-08 DIAGNOSIS — I10 Essential (primary) hypertension: Secondary | ICD-10-CM | POA: Insufficient documentation

## 2016-07-08 DIAGNOSIS — Z7982 Long term (current) use of aspirin: Secondary | ICD-10-CM | POA: Insufficient documentation

## 2016-07-08 DIAGNOSIS — I252 Old myocardial infarction: Secondary | ICD-10-CM | POA: Insufficient documentation

## 2016-07-08 LAB — COMPREHENSIVE METABOLIC PANEL
ALBUMIN: 3.4 g/dL — AB (ref 3.5–5.0)
ALK PHOS: 141 U/L — AB (ref 38–126)
ALT: 39 U/L (ref 17–63)
ANION GAP: 14 (ref 5–15)
AST: 86 U/L — AB (ref 15–41)
BUN: 14 mg/dL (ref 6–20)
CALCIUM: 8.8 mg/dL — AB (ref 8.9–10.3)
CO2: 21 mmol/L — AB (ref 22–32)
Chloride: 105 mmol/L (ref 101–111)
Creatinine, Ser: 0.76 mg/dL (ref 0.61–1.24)
GFR calc Af Amer: >60 mL/min (ref >60)
GFR calc non Af Amer: >60 mL/min (ref >60)
GLUCOSE: 96 mg/dL (ref 65–99)
Potassium: 4 mmol/L (ref 3.5–5.1)
SODIUM: 140 mmol/L (ref 135–145)
Total Bilirubin: 0.7 mg/dL (ref 0.3–1.2)
Total Protein: 6.9 g/dL (ref 6.5–8.1)

## 2016-07-08 LAB — CBC WITH DIFFERENTIAL/PLATELET
BASOS ABS: 0 10*3/uL (ref 0.0–0.1)
BASOS PCT: 1 %
EOS ABS: 0.1 10*3/uL (ref 0.0–0.7)
Eosinophils Relative: 3 %
HCT: 33 % — ABNORMAL LOW (ref 39.0–52.0)
HEMOGLOBIN: 10.7 g/dL — AB (ref 13.0–17.0)
Lymphocytes Relative: 44 %
Lymphs Abs: 1.7 10*3/uL (ref 0.7–4.0)
MCH: 32.6 pg (ref 26.0–34.0)
MCHC: 32.4 g/dL (ref 30.0–36.0)
MCV: 100.6 fL — ABNORMAL HIGH (ref 78.0–100.0)
Monocytes Absolute: 0.4 10*3/uL (ref 0.1–1.0)
Monocytes Relative: 11 %
NEUTROS PCT: 41 %
Neutro Abs: 1.6 10*3/uL — ABNORMAL LOW (ref 1.7–7.7)
Platelets: 248 10*3/uL (ref 150–400)
RBC: 3.28 MIL/uL — AB (ref 4.22–5.81)
RDW: 14.4 % (ref 11.5–15.5)
WBC: 3.8 10*3/uL — AB (ref 4.0–10.5)

## 2016-07-08 LAB — I-STAT TROPONIN, ED: Troponin i, poc: 0 ng/mL (ref 0.00–0.08)

## 2016-07-08 LAB — LIPASE, BLOOD: Lipase: 48 U/L (ref 11–51)

## 2016-07-08 NOTE — ED Triage Notes (Signed)
The pt arrived by [address] from the [address] with chest p;ain  For 2 hours  Pt c/o mid chest pain  With nausea dizziness and sob  He drank 4 40 oz beers today.. Ems started iv line  He was given nitro x2 and asolirin 324mg  on the way hered with no difference in the pain alert no distress  Poor hygiene

## 2016-07-08 NOTE — ED Notes (Signed)
Pt retujrned from xray  Pain comes and goes
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
## 2016-08-21 ENCOUNTER — Telehealth: Payer: Self-pay | Admitting: Internal Medicine

## 2016-08-21 NOTE — Telephone Encounter (Signed)
TALKED WITH PATIENT HE WAS DENIED MEDICAID, HE WILL COME IN AND RENEW HIS GCCN CARD ON 08/30/16

## 2016-08-30 ENCOUNTER — Ambulatory Visit: Payer: Self-pay

## 2016-09-04 ENCOUNTER — Encounter: Payer: Self-pay | Admitting: Internal Medicine

## 2016-09-04 NOTE — Assessment & Plan Note (Deleted)
CRC screening

## 2016-09-04 NOTE — Assessment & Plan Note (Deleted)
  Depression screen [facility] 2/9 05/23/2016 06/21/2015 01/12/2015 09/29/2014 07/07/2014  Decreased Interest 3 3 1 3 3   Down, Depressed, Hopeless 3 3 1 3 3   PHQ - 2 Score 6 6 2 6 6   Altered sleeping 2 2 0 0 2  Tired, decreased energy 2 3 0 0 3  Change in appetite 3 3 0 1 3  Feeling bad or failure about yourself  3 3 1  0 3  Trouble concentrating 0 0 2 0 3  Moving slowly or fidgety/restless 0 3 2 3 3   Suicidal thoughts 0 0 0 0 0  PHQ-9 Score 16 20 7 10 23   Difficult doing work/chores Very difficult Extremely dIfficult Not difficult at all - -    Current medications:  Previous medications:  Assessment   Plan  Medications:  Other:
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
## 2016-09-04 NOTE — Assessment & Plan Note (Deleted)
BP Readings from Last 3 Encounters:  07/08/16 126/87  06/18/16 112/72  06/05/16 140/91   Lab Results  Component Value Date   CREATININE 0.76 07/08/2016   Lab Results  Component Value Date   K 4.0 07/08/2016   Current medications: atenolol 100 mg daily  Atenolol not available through [facility]. Assessment BP goal: <140/90 BP control:  Plan Medications: discontinue atenolol; start carvedilol 25 mg BID Other:

## 2016-09-08 ENCOUNTER — Other Ambulatory Visit: Payer: Self-pay | Admitting: Pharmacist

## 2016-09-08 DIAGNOSIS — I25119 Atherosclerotic heart disease of native coronary artery with unspecified angina pectoris: Secondary | ICD-10-CM

## 2016-09-08 MED ORDER — CARVEDILOL 25 MG PO TABS: 25.0000 mg | ORAL_TABLET | Freq: Two times a day (BID) | ORAL | 3 refills | Status: DC

## 2016-09-20 ENCOUNTER — Encounter (HOSPITAL_COMMUNITY): Payer: Self-pay | Admitting: Emergency Medicine

## 2016-09-20 ENCOUNTER — Emergency Department (HOSPITAL_COMMUNITY)
Admission: EM | Admit: 2016-09-20 | Discharge: 2016-09-20 | Disposition: A | Discharge status: Home / Self Care | Payer: Self-pay | Attending: Emergency Medicine | Admitting: Emergency Medicine

## 2016-09-20 DIAGNOSIS — Z79899 Other long term (current) drug therapy: Secondary | ICD-10-CM | POA: Insufficient documentation

## 2016-09-20 DIAGNOSIS — I252 Old myocardial infarction: Secondary | ICD-10-CM | POA: Insufficient documentation

## 2016-09-20 DIAGNOSIS — K209 Esophagitis, unspecified without bleeding: Secondary | ICD-10-CM

## 2016-09-20 DIAGNOSIS — F1010 Alcohol abuse, uncomplicated: Secondary | ICD-10-CM | POA: Insufficient documentation

## 2016-09-20 DIAGNOSIS — Z7982 Long term (current) use of aspirin: Secondary | ICD-10-CM | POA: Insufficient documentation

## 2016-09-20 DIAGNOSIS — F17210 Nicotine dependence, cigarettes, uncomplicated: Secondary | ICD-10-CM | POA: Insufficient documentation

## 2016-09-20 DIAGNOSIS — I10 Essential (primary) hypertension: Secondary | ICD-10-CM | POA: Insufficient documentation

## 2016-09-20 DIAGNOSIS — R55 Syncope and collapse: Secondary | ICD-10-CM | POA: Insufficient documentation

## 2016-09-20 LAB — RAPID URINE DRUG SCREEN, HOSP PERFORMED
AMPHETAMINES: NOT DETECTED
BENZODIAZEPINES: NOT DETECTED
Barbiturates: NOT DETECTED
Cocaine: NOT DETECTED
Opiates: NOT DETECTED
TETRAHYDROCANNABINOL: POSITIVE — AB

## 2016-09-20 LAB — CBC WITH DIFFERENTIAL/PLATELET
BASOS PCT: 0 %
Basophils Absolute: 0 10*3/uL (ref 0.0–0.1)
EOS ABS: 0.2 10*3/uL (ref 0.0–0.7)
EOS PCT: 4 %
HCT: 39 % (ref 39.0–52.0)
Hemoglobin: 13.2 g/dL (ref 13.0–17.0)
LYMPHS ABS: 2.6 10*3/uL (ref 0.7–4.0)
Lymphocytes Relative: 45 %
MCH: 32.7 pg (ref 26.0–34.0)
MCHC: 33.8 g/dL (ref 30.0–36.0)
MCV: 96.5 fL (ref 78.0–100.0)
MONOS PCT: 5 %
Monocytes Absolute: 0.3 10*3/uL (ref 0.1–1.0)
Neutro Abs: 2.7 10*3/uL (ref 1.7–7.7)
Neutrophils Relative %: 46 %
PLATELETS: 284 10*3/uL (ref 150–400)
RBC: 4.04 MIL/uL — ABNORMAL LOW (ref 4.22–5.81)
RDW: 13.9 % (ref 11.5–15.5)
WBC: 5.7 10*3/uL (ref 4.0–10.5)

## 2016-09-20 LAB — COMPREHENSIVE METABOLIC PANEL
ALT: 44 U/L (ref 17–63)
ANION GAP: 13 (ref 5–15)
AST: 44 U/L — ABNORMAL HIGH (ref 15–41)
Albumin: 4 g/dL (ref 3.5–5.0)
Alkaline Phosphatase: 82 U/L (ref 38–126)
BUN: 17 mg/dL (ref 6–20)
CHLORIDE: 105 mmol/L (ref 101–111)
CO2: 19 mmol/L — AB (ref 22–32)
Calcium: 8.8 mg/dL — ABNORMAL LOW (ref 8.9–10.3)
Creatinine, Ser: 1.19 mg/dL (ref 0.61–1.24)
GFR calc non Af Amer: >60 mL/min (ref >60)
Glucose, Bld: 84 mg/dL (ref 65–99)
POTASSIUM: 3.7 mmol/L (ref 3.5–5.1)
SODIUM: 137 mmol/L (ref 135–145)
TOTAL PROTEIN: 8.1 g/dL (ref 6.5–8.1)
Total Bilirubin: 0.9 mg/dL (ref 0.3–1.2)

## 2016-09-20 MED ORDER — OMEPRAZOLE 20 MG PO CPDR
20.0000 mg | DELAYED_RELEASE_CAPSULE | Freq: Two times a day (BID) | ORAL | 0 refills | Status: DC
Start: 2016-09-20 — End: 2017-07-08

## 2016-09-20 NOTE — ED Notes (Signed)
Pt ambulated without assistance

## 2016-09-20 NOTE — ED Notes (Signed)
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
## 2016-09-20 NOTE — Discharge Instructions (Signed)
Take the medicine prescribed. Refrain from alcohol - it will make your ulcer worse.
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
## 2016-10-06 ENCOUNTER — Encounter: Payer: Self-pay | Admitting: *Deleted

## 2016-10-30 ENCOUNTER — Emergency Department (HOSPITAL_COMMUNITY)
Admission: EM | Admit: 2016-10-30 | Discharge: 2016-10-30 | Disposition: A | Discharge status: Home / Self Care | Payer: Self-pay | Attending: Emergency Medicine | Admitting: Emergency Medicine

## 2016-10-30 ENCOUNTER — Emergency Department (HOSPITAL_COMMUNITY): Payer: Self-pay

## 2016-10-30 ENCOUNTER — Encounter (HOSPITAL_COMMUNITY): Payer: Self-pay | Admitting: Emergency Medicine

## 2016-10-30 DIAGNOSIS — S2232XD Fracture of one rib, left side, subsequent encounter for fracture with routine healing: Secondary | ICD-10-CM

## 2016-10-30 DIAGNOSIS — Y939 Activity, unspecified: Secondary | ICD-10-CM | POA: Insufficient documentation

## 2016-10-30 DIAGNOSIS — Z79899 Other long term (current) drug therapy: Secondary | ICD-10-CM | POA: Insufficient documentation

## 2016-10-30 DIAGNOSIS — F17210 Nicotine dependence, cigarettes, uncomplicated: Secondary | ICD-10-CM | POA: Insufficient documentation

## 2016-10-30 DIAGNOSIS — Y929 Unspecified place or not applicable: Secondary | ICD-10-CM | POA: Insufficient documentation

## 2016-10-30 DIAGNOSIS — X58XXXA Exposure to other specified factors, initial encounter: Secondary | ICD-10-CM | POA: Insufficient documentation

## 2016-10-30 DIAGNOSIS — Z7982 Long term (current) use of aspirin: Secondary | ICD-10-CM | POA: Insufficient documentation

## 2016-10-30 DIAGNOSIS — I252 Old myocardial infarction: Secondary | ICD-10-CM | POA: Insufficient documentation

## 2016-10-30 DIAGNOSIS — S2232XA Fracture of one rib, left side, initial encounter for closed fracture: Secondary | ICD-10-CM | POA: Insufficient documentation

## 2016-10-30 DIAGNOSIS — Z590 Homelessness: Secondary | ICD-10-CM | POA: Insufficient documentation

## 2016-10-30 DIAGNOSIS — Y999 Unspecified external cause status: Secondary | ICD-10-CM | POA: Insufficient documentation

## 2016-10-30 DIAGNOSIS — I10 Essential (primary) hypertension: Secondary | ICD-10-CM | POA: Insufficient documentation

## 2016-10-30 LAB — URINALYSIS, ROUTINE W REFLEX MICROSCOPIC
GLUCOSE, UA: NEGATIVE mg/dL
HGB URINE DIPSTICK: NEGATIVE
Ketones, ur: NEGATIVE mg/dL
Leukocytes, UA: NEGATIVE
Nitrite: NEGATIVE
PH: 5 (ref 5.0–8.0)
Protein, ur: NEGATIVE mg/dL
SPECIFIC GRAVITY, URINE: 1.023 (ref 1.005–1.030)

## 2016-10-30 MED ORDER — METHOCARBAMOL 500 MG PO TABS
500.0000 mg | ORAL_TABLET | Freq: Once | ORAL | Status: AC
  Administered 2016-10-30: 500 mg via ORAL
  Filled 2016-10-30: qty 1

## 2016-10-30 MED ORDER — HYDROCODONE-ACETAMINOPHEN 5-325 MG PO TABS
1.0000 | ORAL_TABLET | Freq: Once | ORAL | Status: AC
  Administered 2016-10-30: 1 via ORAL
  Filled 2016-10-30: qty 1

## 2016-10-30 NOTE — ED Triage Notes (Signed)
Pt to ED with c/o left lower back pain onset approx 1 hour ago.  No known injuiry
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
## 2016-10-30 NOTE — ED Notes (Signed)
Attempted to take initial vitals in waiting room. Pt could not sit still long enough for temp or BP to read.

## 2016-10-30 NOTE — ED Notes (Signed)
Pt returned from xray

## 2016-10-30 NOTE — ED Notes (Signed)
Pt given an ice pack for pain per [facility]

## 2016-10-30 NOTE — Discharge Instructions (Signed)
Your x-ray today shows that the rib fracture you have on the left side is healing. This is the area where you are having pain. Take the medication for pain as prescribed by your doctor. Apply ice to the area.

## 2016-10-30 NOTE — ED Notes (Signed)
Patient transported to X-ray 

## 2016-11-09 ENCOUNTER — Encounter: Payer: Self-pay | Admitting: Internal Medicine

## 2016-11-09 ENCOUNTER — Ambulatory Visit (INDEPENDENT_AMBULATORY_CARE_PROVIDER_SITE_OTHER): Payer: Self-pay | Admitting: Internal Medicine

## 2016-11-09 VITALS — BP 137/97 | HR 82 | Temp 97.6°F | Ht 70.0 in | Wt 149.0 lb

## 2016-11-09 DIAGNOSIS — F17210 Nicotine dependence, cigarettes, uncomplicated: Secondary | ICD-10-CM

## 2016-11-09 DIAGNOSIS — I10 Essential (primary) hypertension: Secondary | ICD-10-CM

## 2016-11-09 DIAGNOSIS — M545 Low back pain, unspecified: Secondary | ICD-10-CM | POA: Insufficient documentation

## 2016-11-09 DIAGNOSIS — Z8249 Family history of ischemic heart disease and other diseases of the circulatory system: Secondary | ICD-10-CM

## 2016-11-09 MED ORDER — KETOROLAC TROMETHAMINE 30 MG/ML IJ SOLN
30.0000 mg | Freq: Once | INTRAMUSCULAR | Status: AC
  Administered 2016-11-09: 30 mg via INTRAMUSCULAR
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
## 2016-11-09 NOTE — Patient Instructions (Addendum)
Thank you for coming in Mr. Mceuen.   We're giving you a shot of pain medicine to help you with your pain and want to make sure you see your new primary doctor, Dr. Shan Levans, soon. Please bring your medicines to that appointment

## 2016-11-09 NOTE — Assessment & Plan Note (Addendum)
The patient reports taking several medications for blood pressure and for cardiac issues, however, he did not bring his medications or remember the names.  On review, he was previously taking atenolol, now in the chart as carvedilol without documentation of the reason for change. Given uncertainty of his current medication regimen and relatively mild BP elevation today, will defer medication management to PCP, f/u in 3-4 weeks if able. Pt instructed to bring all current medicines to that visit

## 2016-11-09 NOTE — Assessment & Plan Note (Signed)
He was seen in the ED for this pain on 7/2, XR showed a remote, healing fracture of his L sixth rib. On current exam the pain is not located at the L 6th rib, instead is more inferior over the iliac crest. His pain is likely musculoskeletal in nature, as there are no concerning signs for other etiologies or indications for further workup. He was given Toradol 30 mg IM injection in the clinic as obtaining meds may still be difficult given recent orange card approval.

## 2016-11-10 NOTE — Progress Notes (Signed)
Internal Medicine Clinic Attending  I saw and evaluated the patient.  I personally confirmed the key portions of the history and exam documented by Dr. Harden and I reviewed pertinent patient test results.  The assessment, diagnosis, and plan were formulated together and I agree with the documentation in the resident's note.  

## 2016-11-17 ENCOUNTER — Encounter (HOSPITAL_COMMUNITY): Payer: Self-pay | Admitting: *Deleted

## 2016-11-17 ENCOUNTER — Emergency Department (HOSPITAL_COMMUNITY)
Admission: EM | Admit: 2016-11-17 | Discharge: 2016-11-17 | Disposition: A | Discharge status: Home / Self Care | Payer: Self-pay | Attending: Emergency Medicine | Admitting: Emergency Medicine

## 2016-11-17 DIAGNOSIS — N342 Other urethritis: Secondary | ICD-10-CM

## 2016-11-17 DIAGNOSIS — R369 Urethral discharge, unspecified: Secondary | ICD-10-CM | POA: Insufficient documentation

## 2016-11-17 DIAGNOSIS — N341 Nonspecific urethritis: Secondary | ICD-10-CM | POA: Insufficient documentation

## 2016-11-17 DIAGNOSIS — I2510 Atherosclerotic heart disease of native coronary artery without angina pectoris: Secondary | ICD-10-CM | POA: Insufficient documentation

## 2016-11-17 DIAGNOSIS — I10 Essential (primary) hypertension: Secondary | ICD-10-CM | POA: Insufficient documentation

## 2016-11-17 DIAGNOSIS — Z79899 Other long term (current) drug therapy: Secondary | ICD-10-CM | POA: Insufficient documentation

## 2016-11-17 DIAGNOSIS — Z7982 Long term (current) use of aspirin: Secondary | ICD-10-CM | POA: Insufficient documentation

## 2016-11-17 DIAGNOSIS — F329 Major depressive disorder, single episode, unspecified: Secondary | ICD-10-CM | POA: Insufficient documentation

## 2016-11-17 DIAGNOSIS — Z590 Homelessness: Secondary | ICD-10-CM | POA: Insufficient documentation

## 2016-11-17 DIAGNOSIS — F1210 Cannabis abuse, uncomplicated: Secondary | ICD-10-CM | POA: Insufficient documentation

## 2016-11-17 DIAGNOSIS — I252 Old myocardial infarction: Secondary | ICD-10-CM | POA: Insufficient documentation

## 2016-11-17 DIAGNOSIS — F17210 Nicotine dependence, cigarettes, uncomplicated: Secondary | ICD-10-CM | POA: Insufficient documentation

## 2016-11-17 LAB — URINALYSIS, ROUTINE W REFLEX MICROSCOPIC
Glucose, UA: NEGATIVE mg/dL
Hgb urine dipstick: NEGATIVE
KETONES UR: NEGATIVE mg/dL
LEUKOCYTES UA: NEGATIVE
NITRITE: NEGATIVE
PH: 6 (ref 5.0–8.0)
PROTEIN: NEGATIVE mg/dL
Specific Gravity, Urine: 1.012 (ref 1.005–1.030)

## 2016-11-17 LAB — WET PREP, GENITAL
Sperm: NONE SEEN
TRICH WET PREP: NONE SEEN
YEAST WET PREP: NONE SEEN

## 2016-11-17 MED ORDER — METRONIDAZOLE 500 MG PO TABS
2000.0000 mg | ORAL_TABLET | Freq: Once | ORAL | Status: AC
  Administered 2016-11-17: 2000 mg via ORAL
  Filled 2016-11-17: qty 4

## 2016-11-17 MED ORDER — ONDANSETRON 4 MG PO TBDP
4.0000 mg | ORAL_TABLET | Freq: Once | ORAL | Status: AC
  Administered 2016-11-17: 4 mg via ORAL
  Filled 2016-11-17: qty 1

## 2016-11-17 MED ORDER — CEFTRIAXONE SODIUM 250 MG IJ SOLR
250.0000 mg | Freq: Once | INTRAMUSCULAR | Status: AC
  Administered 2016-11-17: 250 mg via INTRAMUSCULAR
  Filled 2016-11-17: qty 250

## 2016-11-17 MED ORDER — AZITHROMYCIN 250 MG PO TABS
1000.0000 mg | ORAL_TABLET | Freq: Once | ORAL | Status: AC
  Administered 2016-11-17: 1000 mg via ORAL
  Filled 2016-11-17: qty 4

## 2016-11-17 NOTE — ED Triage Notes (Signed)
Pt reports having yellow penile discharge and pain. No distress noted at triage.

## 2016-11-17 NOTE — ED Notes (Signed)
Patient Alert and oriented X4. Stable and ambulatory. Patient verbalized understanding of the discharge instructions.  Patient belongings were taken by the patient.  
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
## 2016-11-17 NOTE — Discharge Instructions (Signed)
As discussed, you were treated with 3 antibiotics today for your penile discharge. Avoid sexual intercourse for at least one week and notify any sexual partners so they can be treated. Your urine did not show any signs of infection.  Follow up with the Wellness center. Return if symptoms worsen or you experience new concerning symptoms in the meantime.

## 2016-11-19 ENCOUNTER — Inpatient Hospital Stay (HOSPITAL_COMMUNITY)
Admission: EM | Admit: 2016-11-19 | Discharge: 2016-11-22 | DRG: 433 | Disposition: A | Discharge status: Home / Self Care | Payer: Self-pay | Attending: Internal Medicine | Admitting: Internal Medicine

## 2016-11-19 ENCOUNTER — Emergency Department (HOSPITAL_COMMUNITY): Payer: Self-pay

## 2016-11-19 ENCOUNTER — Encounter (HOSPITAL_COMMUNITY): Payer: Self-pay | Admitting: *Deleted

## 2016-11-19 DIAGNOSIS — E785 Hyperlipidemia, unspecified: Secondary | ICD-10-CM | POA: Diagnosis present

## 2016-11-19 DIAGNOSIS — R1010 Upper abdominal pain, unspecified: Secondary | ICD-10-CM

## 2016-11-19 DIAGNOSIS — Z6821 Body mass index (BMI) 21.0-21.9, adult: Secondary | ICD-10-CM

## 2016-11-19 DIAGNOSIS — R945 Abnormal results of liver function studies: Secondary | ICD-10-CM

## 2016-11-19 DIAGNOSIS — F1020 Alcohol dependence, uncomplicated: Secondary | ICD-10-CM | POA: Diagnosis present

## 2016-11-19 DIAGNOSIS — Y906 Blood alcohol level of 120-199 mg/100 ml: Secondary | ICD-10-CM | POA: Diagnosis present

## 2016-11-19 DIAGNOSIS — E440 Moderate protein-calorie malnutrition: Secondary | ICD-10-CM | POA: Diagnosis present

## 2016-11-19 DIAGNOSIS — Z79899 Other long term (current) drug therapy: Secondary | ICD-10-CM

## 2016-11-19 DIAGNOSIS — H9190 Unspecified hearing loss, unspecified ear: Secondary | ICD-10-CM | POA: Diagnosis present

## 2016-11-19 DIAGNOSIS — K209 Esophagitis, unspecified without bleeding: Secondary | ICD-10-CM | POA: Diagnosis present

## 2016-11-19 DIAGNOSIS — R17 Unspecified jaundice: Secondary | ICD-10-CM

## 2016-11-19 DIAGNOSIS — E876 Hypokalemia: Secondary | ICD-10-CM | POA: Diagnosis present

## 2016-11-19 DIAGNOSIS — R740 Nonspecific elevation of levels of transaminase and lactic acid dehydrogenase [LDH]: Secondary | ICD-10-CM | POA: Diagnosis present

## 2016-11-19 DIAGNOSIS — K644 Residual hemorrhoidal skin tags: Secondary | ICD-10-CM | POA: Diagnosis present

## 2016-11-19 DIAGNOSIS — K219 Gastro-esophageal reflux disease without esophagitis: Secondary | ICD-10-CM | POA: Diagnosis present

## 2016-11-19 DIAGNOSIS — I2510 Atherosclerotic heart disease of native coronary artery without angina pectoris: Secondary | ICD-10-CM | POA: Diagnosis present

## 2016-11-19 DIAGNOSIS — Z590 Homelessness: Secondary | ICD-10-CM

## 2016-11-19 DIAGNOSIS — D649 Anemia, unspecified: Secondary | ICD-10-CM | POA: Diagnosis present

## 2016-11-19 DIAGNOSIS — E871 Hypo-osmolality and hyponatremia: Secondary | ICD-10-CM | POA: Diagnosis present

## 2016-11-19 DIAGNOSIS — F17210 Nicotine dependence, cigarettes, uncomplicated: Secondary | ICD-10-CM | POA: Diagnosis present

## 2016-11-19 DIAGNOSIS — R7401 Elevation of levels of liver transaminase levels: Secondary | ICD-10-CM | POA: Diagnosis present

## 2016-11-19 DIAGNOSIS — R7989 Other specified abnormal findings of blood chemistry: Secondary | ICD-10-CM

## 2016-11-19 DIAGNOSIS — I119 Hypertensive heart disease without heart failure: Secondary | ICD-10-CM | POA: Diagnosis present

## 2016-11-19 DIAGNOSIS — K7030 Alcoholic cirrhosis of liver without ascites: Secondary | ICD-10-CM

## 2016-11-19 DIAGNOSIS — K7010 Alcoholic hepatitis without ascites: Principal | ICD-10-CM | POA: Diagnosis present

## 2016-11-19 DIAGNOSIS — I10 Essential (primary) hypertension: Secondary | ICD-10-CM | POA: Diagnosis present

## 2016-11-19 DIAGNOSIS — Z7982 Long term (current) use of aspirin: Secondary | ICD-10-CM

## 2016-11-19 DIAGNOSIS — I252 Old myocardial infarction: Secondary | ICD-10-CM | POA: Diagnosis present

## 2016-11-19 DIAGNOSIS — K449 Diaphragmatic hernia without obstruction or gangrene: Secondary | ICD-10-CM | POA: Diagnosis present

## 2016-11-19 DIAGNOSIS — K210 Gastro-esophageal reflux disease with esophagitis: Secondary | ICD-10-CM | POA: Diagnosis present

## 2016-11-19 DIAGNOSIS — I43 Cardiomyopathy in diseases classified elsewhere: Secondary | ICD-10-CM

## 2016-11-19 DIAGNOSIS — R197 Diarrhea, unspecified: Secondary | ICD-10-CM | POA: Diagnosis present

## 2016-11-19 LAB — URINALYSIS, ROUTINE W REFLEX MICROSCOPIC
Bilirubin Urine: NEGATIVE
GLUCOSE, UA: NEGATIVE mg/dL
Hgb urine dipstick: NEGATIVE
Ketones, ur: NEGATIVE mg/dL
LEUKOCYTES UA: NEGATIVE
Nitrite: NEGATIVE
PH: 6 (ref 5.0–8.0)
Protein, ur: NEGATIVE mg/dL
SPECIFIC GRAVITY, URINE: 1.002 — AB (ref 1.005–1.030)

## 2016-11-19 LAB — COMPREHENSIVE METABOLIC PANEL
ALT: 131 U/L — ABNORMAL HIGH (ref 17–63)
AST: 252 U/L — AB (ref 15–41)
Albumin: 2.5 g/dL — ABNORMAL LOW (ref 3.5–5.0)
Alkaline Phosphatase: 750 U/L — ABNORMAL HIGH (ref 38–126)
Anion gap: 10 (ref 5–15)
BUN: 9 mg/dL (ref 6–20)
CHLORIDE: 96 mmol/L — AB (ref 101–111)
CO2: 20 mmol/L — AB (ref 22–32)
Calcium: 8.3 mg/dL — ABNORMAL LOW (ref 8.9–10.3)
Creatinine, Ser: 0.79 mg/dL (ref 0.61–1.24)
Glucose, Bld: 88 mg/dL (ref 65–99)
POTASSIUM: 4.4 mmol/L (ref 3.5–5.1)
SODIUM: 126 mmol/L — AB (ref 135–145)
Total Bilirubin: 12.5 mg/dL — ABNORMAL HIGH (ref 0.3–1.2)
Total Protein: 6.6 g/dL (ref 6.5–8.1)

## 2016-11-19 LAB — I-STAT TROPONIN, ED: Troponin i, poc: 0.01 ng/mL (ref 0.00–0.08)

## 2016-11-19 LAB — CBC
HEMATOCRIT: 33.3 % — AB (ref 39.0–52.0)
Hemoglobin: 11.2 g/dL — ABNORMAL LOW (ref 13.0–17.0)
MCH: 30.2 pg (ref 26.0–34.0)
MCHC: 33.6 g/dL (ref 30.0–36.0)
MCV: 89.8 fL (ref 78.0–100.0)
Platelets: 198 10*3/uL (ref 150–400)
RBC: 3.71 MIL/uL — AB (ref 4.22–5.81)
RDW: 19 % — ABNORMAL HIGH (ref 11.5–15.5)
WBC: 7.4 10*3/uL (ref 4.0–10.5)

## 2016-11-19 LAB — LIPASE, BLOOD: LIPASE: 60 U/L — AB (ref 11–51)

## 2016-11-19 LAB — ACETAMINOPHEN LEVEL

## 2016-11-19 MED ORDER — ONDANSETRON HCL 4 MG/2ML IJ SOLN
4.0000 mg | Freq: Once | INTRAMUSCULAR | Status: AC
  Administered 2016-11-19: 4 mg via INTRAVENOUS
  Filled 2016-11-19: qty 2

## 2016-11-19 MED ORDER — MORPHINE SULFATE (PF) 4 MG/ML IV SOLN
4.0000 mg | Freq: Once | INTRAVENOUS | Status: AC
  Administered 2016-11-19: 4 mg via INTRAVENOUS
  Filled 2016-11-19: qty 1

## 2016-11-19 MED ORDER — SODIUM CHLORIDE 0.9 % IV BOLUS (SEPSIS)
500.0000 mL | Freq: Once | INTRAVENOUS | Status: AC
  Administered 2016-11-19: 500 mL via INTRAVENOUS

## 2016-11-19 MED ORDER — IOPAMIDOL (ISOVUE-300) INJECTION 61%
INTRAVENOUS | Status: AC
  Administered 2016-11-19: 100 mL via INTRAVENOUS
  Filled 2016-11-19: qty 100
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
## 2016-11-19 NOTE — ED Notes (Signed)
Pt called from waiting room. Pt not there at this time and not in room.

## 2016-11-19 NOTE — ED Notes (Signed)
PT states that "he drinks 2 times a week with his friends but when he drinks he drinks bad."

## 2016-11-19 NOTE — ED Triage Notes (Signed)
Pt c/o diarrhea x 1 week and dark-colored urine. Pt also reporting chest pain, L sided flank, and abd pain.

## 2016-11-20 ENCOUNTER — Encounter (HOSPITAL_COMMUNITY): Payer: Self-pay | Admitting: General Practice

## 2016-11-20 DIAGNOSIS — K7030 Alcoholic cirrhosis of liver without ascites: Secondary | ICD-10-CM

## 2016-11-20 DIAGNOSIS — R17 Unspecified jaundice: Secondary | ICD-10-CM

## 2016-11-20 LAB — MRSA PCR SCREENING: MRSA by PCR: NEGATIVE

## 2016-11-20 LAB — COMPREHENSIVE METABOLIC PANEL
ALT: 112 U/L — ABNORMAL HIGH (ref 17–63)
ANION GAP: 10 (ref 5–15)
AST: 221 U/L — AB (ref 15–41)
Albumin: 2.2 g/dL — ABNORMAL LOW (ref 3.5–5.0)
Alkaline Phosphatase: 714 U/L — ABNORMAL HIGH (ref 38–126)
BILIRUBIN TOTAL: 11.8 mg/dL — AB (ref 0.3–1.2)
BUN: 9 mg/dL (ref 6–20)
CO2: 22 mmol/L (ref 22–32)
Calcium: 7.8 mg/dL — ABNORMAL LOW (ref 8.9–10.3)
Chloride: 100 mmol/L — ABNORMAL LOW (ref 101–111)
Creatinine, Ser: 0.79 mg/dL (ref 0.61–1.24)
Glucose, Bld: 88 mg/dL (ref 65–99)
POTASSIUM: 3.3 mmol/L — AB (ref 3.5–5.1)
Sodium: 132 mmol/L — ABNORMAL LOW (ref 135–145)
TOTAL PROTEIN: 6.1 g/dL — AB (ref 6.5–8.1)

## 2016-11-20 LAB — MAGNESIUM: MAGNESIUM: 1.4 mg/dL — AB (ref 1.7–2.4)

## 2016-11-20 LAB — GC/CHLAMYDIA PROBE AMP (~~LOC~~) NOT AT ARMC
Chlamydia: NEGATIVE
Neisseria Gonorrhea: NEGATIVE

## 2016-11-20 LAB — BILIRUBIN, DIRECT: Bilirubin, Direct: 6.7 mg/dL — ABNORMAL HIGH (ref 0.1–0.5)

## 2016-11-20 LAB — PROTIME-INR
INR: 1.27
PROTHROMBIN TIME: 15.9 s — AB (ref 11.4–15.2)

## 2016-11-20 LAB — BASIC METABOLIC PANEL
ANION GAP: 12 (ref 5–15)
BUN: 8 mg/dL (ref 6–20)
CHLORIDE: 100 mmol/L — AB (ref 101–111)
CO2: 18 mmol/L — ABNORMAL LOW (ref 22–32)
Calcium: 7.8 mg/dL — ABNORMAL LOW (ref 8.9–10.3)
Creatinine, Ser: 0.77 mg/dL (ref 0.61–1.24)
Glucose, Bld: 77 mg/dL (ref 65–99)
POTASSIUM: 3.4 mmol/L — AB (ref 3.5–5.1)
SODIUM: 130 mmol/L — AB (ref 135–145)

## 2016-11-20 LAB — IRON AND TIBC
Iron: 216 ug/dL — ABNORMAL HIGH (ref 45–182)
Saturation Ratios: 140 % — ABNORMAL HIGH (ref 17.9–39.5)
TIBC: 154 ug/dL — ABNORMAL LOW (ref 250–450)

## 2016-11-20 LAB — RAPID URINE DRUG SCREEN, HOSP PERFORMED
Amphetamines: NOT DETECTED
BARBITURATES: NOT DETECTED
Benzodiazepines: NOT DETECTED
Cocaine: NOT DETECTED
OPIATES: NOT DETECTED
TETRAHYDROCANNABINOL: NOT DETECTED

## 2016-11-20 LAB — VITAMIN B12: Vitamin B-12: 1800 pg/mL — ABNORMAL HIGH (ref 180–914)

## 2016-11-20 LAB — LIPID PANEL
CHOLESTEROL: 459 mg/dL — AB (ref 0–200)
HDL: <10 mg/dL — ABNORMAL LOW (ref >40)
LDL Cholesterol: UNDETERMINED mg/dL (ref 0–99)
TRIGLYCERIDES: 414 mg/dL — AB (ref <150)
VLDL: UNDETERMINED mg/dL (ref 0–40)

## 2016-11-20 LAB — CBC
HEMATOCRIT: 31.8 % — AB (ref 39.0–52.0)
Hemoglobin: 10.7 g/dL — ABNORMAL LOW (ref 13.0–17.0)
MCH: 30.5 pg (ref 26.0–34.0)
MCHC: 33.6 g/dL (ref 30.0–36.0)
MCV: 90.6 fL (ref 78.0–100.0)
Platelets: 203 10*3/uL (ref 150–400)
RBC: 3.51 MIL/uL — AB (ref 4.22–5.81)
RDW: 19.4 % — AB (ref 11.5–15.5)
WBC: 5.8 10*3/uL (ref 4.0–10.5)

## 2016-11-20 LAB — SALICYLATE LEVEL: Salicylate Lvl: <7 mg/dL (ref 2.8–30.0)

## 2016-11-20 LAB — PHOSPHORUS: Phosphorus: 3.6 mg/dL (ref 2.5–4.6)

## 2016-11-20 LAB — ETHANOL: Alcohol, Ethyl (B): 174 mg/dL — ABNORMAL HIGH (ref <5)

## 2016-11-20 LAB — OCCULT BLOOD X 1 CARD TO LAB, STOOL: Fecal Occult Bld: POSITIVE — AB

## 2016-11-20 MED ORDER — MAGNESIUM SULFATE 2 GM/50ML IV SOLN
2.0000 g | Freq: Once | INTRAVENOUS | Status: AC
  Administered 2016-11-20: 2 g via INTRAVENOUS
  Filled 2016-11-20: qty 50

## 2016-11-20 MED ORDER — GI COCKTAIL ~~LOC~~: 30.0000 mL | Freq: Three times a day (TID) | ORAL | Status: DC | PRN

## 2016-11-20 MED ORDER — KETOROLAC TROMETHAMINE 30 MG/ML IJ SOLN
30.0000 mg | Freq: Four times a day (QID) | INTRAMUSCULAR | Status: DC | PRN
  Administered 2016-11-20: 30 mg via INTRAVENOUS
  Filled 2016-11-20: qty 1

## 2016-11-20 MED ORDER — MUPIROCIN 2 % EX OINT: 1.0000 "application " | TOPICAL_OINTMENT | Freq: Two times a day (BID) | CUTANEOUS | Status: DC

## 2016-11-20 MED ORDER — ADULT MULTIVITAMIN W/MINERALS CH
1.0000 | ORAL_TABLET | Freq: Every day | ORAL | Status: DC
  Administered 2016-11-21 – 2016-11-22 (×2): 1 via ORAL
  Filled 2016-11-20 (×2): qty 1

## 2016-11-20 MED ORDER — ENSURE ENLIVE PO LIQD
237.0000 mL | Freq: Two times a day (BID) | ORAL | Status: DC
  Administered 2016-11-20 – 2016-11-21 (×3): 237 mL via ORAL

## 2016-11-20 MED ORDER — VITAMIN B-1 100 MG PO TABS
100.0000 mg | ORAL_TABLET | Freq: Every day | ORAL | Status: DC
  Administered 2016-11-21 – 2016-11-22 (×2): 100 mg via ORAL
  Filled 2016-11-20 (×2): qty 1

## 2016-11-20 MED ORDER — ENOXAPARIN SODIUM 40 MG/0.4ML ~~LOC~~ SOLN
40.0000 mg | SUBCUTANEOUS | Status: DC
  Administered 2016-11-20 – 2016-11-22 (×3): 40 mg via SUBCUTANEOUS
  Filled 2016-11-20 (×4): qty 0.4

## 2016-11-20 MED ORDER — POTASSIUM CHLORIDE CRYS ER 20 MEQ PO TBCR
40.0000 meq | EXTENDED_RELEASE_TABLET | Freq: Once | ORAL | Status: AC
  Administered 2016-11-20: 40 meq via ORAL
  Filled 2016-11-20: qty 2

## 2016-11-20 MED ORDER — THIAMINE HCL 100 MG/ML IJ SOLN: 100.0000 mg | Freq: Every day | INTRAMUSCULAR | Status: DC

## 2016-11-20 MED ORDER — CHLORHEXIDINE GLUCONATE CLOTH 2 % EX PADS: 6.0000 | MEDICATED_PAD | Freq: Every day | CUTANEOUS | Status: DC

## 2016-11-20 MED ORDER — ONDANSETRON HCL 4 MG/2ML IJ SOLN: 4.0000 mg | Freq: Four times a day (QID) | INTRAMUSCULAR | Status: DC | PRN

## 2016-11-20 MED ORDER — SODIUM CHLORIDE 0.9 % IV SOLN
INTRAVENOUS | Status: AC
  Administered 2016-11-20: 11:00:00 via INTRAVENOUS

## 2016-11-20 MED ORDER — FOLIC ACID 1 MG PO TABS
1.0000 mg | ORAL_TABLET | Freq: Every day | ORAL | Status: DC
  Administered 2016-11-21 – 2016-11-22 (×2): 1 mg via ORAL
  Filled 2016-11-20 (×2): qty 1

## 2016-11-20 MED ORDER — THIAMINE HCL 100 MG/ML IJ SOLN
Freq: Once | INTRAVENOUS | Status: AC
  Administered 2016-11-20: 02:00:00 via INTRAVENOUS
  Filled 2016-11-20: qty 1000

## 2016-11-20 MED ORDER — ONDANSETRON HCL 4 MG PO TABS: 4.0000 mg | ORAL_TABLET | Freq: Four times a day (QID) | ORAL | Status: DC | PRN

## 2016-11-20 MED ORDER — ORAL CARE MOUTH RINSE
15.0000 mL | Freq: Two times a day (BID) | OROMUCOSAL | Status: DC
  Administered 2016-11-20 – 2016-11-21 (×3): 15 mL via OROMUCOSAL

## 2016-11-20 NOTE — Progress Notes (Signed)
Initial Nutrition Assessment  DOCUMENTATION CODES:   Non-severe (moderate) malnutrition in context of social or environmental circumstances  INTERVENTION:   Ensure Enlive po BID, each supplement provides 350 kcal and 20 grams of protein   NUTRITION DIAGNOSIS:   Malnutrition (Moderate) related to social / environmental circumstances (EtOH and substance abuse, homeless) as evidenced by mild depletion of body fat, mild depletion of muscle mass.   GOAL:   Patient will meet greater than or equal to 90% of their needs   MONITOR:   PO intake, Supplement acceptance, Labs, Weight trends  REASON FOR ASSESSMENT:   Malnutrition Screening Tool    ASSESSMENT:   56 yo male admitted with abdominal pain, N/V and diarrhea x 1 week hx of EtOH abuse, polysubstance abuse, HTN, HL, esophagitis, GERD, gout. Pt is homeless  CIWA protocol  Pt hungry on visit today, diet just advanced. No recorded po intake. Pt denies N/V/D today, abdominal pain present Pt very vague with regards to po intake PTA. Pt reports intake depends on the day, some days he does not eat anything at all. Noted pt is homeless Poor dentition but reports no problems chewing  UBW 155 pounds. Current wt 149 pounds. Per weight encounters, wt relatively stable  Nutrition-Focused physical exam completed. Findings are mild  fat depletion, mild muscle depletion, and no edema.   Labs: sodium 132, potassium 3.3, magnesium 1.4 Meds: foliac aid, mag sulfate, MVI/minerals, KCl, thiamine  Diet Order:  DIET SOFT Room service appropriate? Yes; Fluid consistency: Thin  Skin:  Reviewed, no issues  Last BM:  7/22  Height:   Ht Readings from Last 1 Encounters:  11/20/16 5\' 10"  (1.778 m)    Weight:   Wt Readings from Last 1 Encounters:  11/20/16 149 lb 7.6 oz (67.8 kg)    BMI:  Body mass index is 21.45 kg/m.  Estimated Nutritional Needs:   Kcal:  1890-2110 kcals  Protein:  95-106 g  Fluid:  >= 1.9 L  EDUCATION NEEDS:    No education needs identified at this time  [place], [place], LDN [phone] Pager  [phone] Weekend/On-Call Pager

## 2016-11-20 NOTE — Progress Notes (Signed)
   Subjective: Patient was sitting on the edge of the bed today. He confirmed that he had been suffering from 1 week of diarrhea and 3-5 days of abdominal pain in the right and left upper quadrants. He in agreement with the plan to keep him for evaluation of the pain and diarrhea. He denied nausea, persistent diarrhea,   Objective:  Vital signs in last 24 hours: Vitals:   11/20/16 0015 11/20/16 0113 11/20/16 0511 11/20/16 0747  BP: (!) 124/92 (!) 145/90 (!) 145/82 (!) 136/97  Pulse: 89 93 79 91  Resp: '15 16 17 16  '$ Temp:  98.2 F (36.8 C) (!) 97.5 F (36.4 C) 98 F (36.7 C)  TempSrc:  Oral Oral Oral  SpO2: 100% 100% 97% 100%  Weight:  149 lb 7.6 oz (67.8 kg)    Height:  '5\' 10"'$  (1.778 m)     Complete ROS negative except as per subjective.   Physical Exam  Constitutional: He is oriented to person, place, and time. He appears well-developed. No distress.  Eyes: EOM are normal. Scleral icterus is present.  Cardiovascular: Normal rate and regular rhythm.   No murmur heard. Pulmonary/Chest: Effort normal and breath sounds normal. No respiratory distress. He has no wheezes.  Abdominal: Soft. Bowel sounds are normal. He exhibits no distension. There is tenderness. There is guarding.  Musculoskeletal: He exhibits no edema or tenderness.  Neurological: He is alert and oriented to person, place, and time.  Skin: Skin is warm and dry. Capillary refill takes less than 2 seconds. He is not diaphoretic.  Psychiatric: He has a normal mood and affect. His behavior is normal.    Assessment/Plan:  Principal Problem:   Jaundice Active Problems:   Alcohol abuse   Essential hypertension   Coronary artery disease   Hyperlipidemia   Homelessness   Easily distractable on examination   Transaminitis   Esophagitis   Diarrhea   Serum total bilirubin elevated   Jaundice: Patient admitted for evaluation of diarrhea, jaundice, NBNB emesis, and diffuse abdominal pain for one week. -Elevated liver  enzymes AST 221 and ALT 112, alk phos of 714 with total bili 11.8 -GI consulted- possibly secondary to medication use, genetics or viral sources -Viral hepatitis panel orderd, AMA, ASMA, alpha 1 antitrypsin and ceruloplasmin ordered as per GI -No plans for imminent procedures  -AVOID all hepatotocix medications   Diarrhea:  -Will continue further evaluation for diarrhea with stool studies as per GI   Alcoholism: -Bal of 174 -CIWA protocol initiated -thiamine, multi and folic acid ordered   CAD/Hyperlipidemia: -Cholesterol 459, triglycerides 414, HDL<10 -Will hold statin due to hepatotoxic affect in liver disease  HTN: -will monitor and restart home meds as indicated    Dispo: Anticipated discharge in approximately 2-3 day(s).   Kathi Ludwig, MD 11/20/2016, 3:44 PM Pager: Pager# [phone]

## 2016-11-20 NOTE — Progress Notes (Signed)
  Pt admitted to the unit. Pt is stable, alert and oriented per baseline. Oriented to room, staff, and call bell. Educated to call for any assistance. Bed in lowest position, call bell within reach- will continue to monitor. 
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
## 2016-11-21 LAB — BASIC METABOLIC PANEL
Anion gap: 6 (ref 5–15)
BUN: 8 mg/dL (ref 6–20)
CALCIUM: 8.6 mg/dL — AB (ref 8.9–10.3)
CO2: 27 mmol/L (ref 22–32)
CREATININE: 0.95 mg/dL (ref 0.61–1.24)
Chloride: 103 mmol/L (ref 101–111)
GFR calc non Af Amer: >60 mL/min (ref >60)
GLUCOSE: 109 mg/dL — AB (ref 65–99)
Potassium: 4.3 mmol/L (ref 3.5–5.1)
Sodium: 136 mmol/L (ref 135–145)

## 2016-11-21 LAB — HEPATIC FUNCTION PANEL
ALBUMIN: 2 g/dL — AB (ref 3.5–5.0)
ALK PHOS: 739 U/L — AB (ref 38–126)
ALT: 91 U/L — ABNORMAL HIGH (ref 17–63)
AST: 165 U/L — ABNORMAL HIGH (ref 15–41)
BILIRUBIN DIRECT: 6.6 mg/dL — AB (ref 0.1–0.5)
BILIRUBIN INDIRECT: 4.5 mg/dL — AB (ref 0.3–0.9)
BILIRUBIN TOTAL: 11.1 mg/dL — AB (ref 0.3–1.2)
Total Protein: 5.8 g/dL — ABNORMAL LOW (ref 6.5–8.1)

## 2016-11-21 LAB — HEPATITIS B SURFACE ANTIBODY,QUALITATIVE: HEP B S AB: NONREACTIVE

## 2016-11-21 LAB — HEPATITIS B SURFACE ANTIGEN
HEP B S AG: NEGATIVE
Hepatitis B Surface Ag: NEGATIVE

## 2016-11-21 LAB — FOLATE RBC
FOLATE, HEMOLYSATE: 362.7 ng/mL
FOLATE, RBC: 1201 ng/mL (ref >498)
HEMATOCRIT: 30.2 % — AB (ref 37.5–51.0)

## 2016-11-21 LAB — RPR: RPR Ser Ql: NONREACTIVE

## 2016-11-21 LAB — HEPATITIS B CORE ANTIBODY, TOTAL: Hep B Core Total Ab: NEGATIVE

## 2016-11-21 MED ORDER — CARVEDILOL 25 MG PO TABS
25.0000 mg | ORAL_TABLET | Freq: Two times a day (BID) | ORAL | Status: DC
  Administered 2016-11-21 – 2016-11-22 (×3): 25 mg via ORAL
  Filled 2016-11-21 (×3): qty 1

## 2016-11-21 NOTE — Progress Notes (Signed)
[facility] Progress Note  Devin [name] 56 y.o. 1960-12-13  CC:  Elevated LFTs   Subjective: No acute issues overnight. Continues to have loose stools with 3-4 bowel movements per day. Denied any fever or chills.  ROS : 80 for chest pain and shortness of breath.   Objective: Vital signs in last 24 hours: Vitals:   11/20/16 2151 11/21/16 0551  BP: (!) 146/93 (!) 140/93  Pulse: 89 89  Resp: 20 19  Temp: 98.6 F (37 C) 98.4 F (36.9 C)    Physical Exam:  General:  Alert, cooperative, no distress, appears stated age  Head:  Normocephalic, without obvious abnormality, atraumatic  Eyes:   icterus noted   Lungs:   Clear to auscultation bilaterally, respirations unlabored  Heart:  Regular rate and rhythm, S1, S2 normal  Abdomen:   Soft Right upper quadrant and epigastric tenderness to palpation without any peritoneal signs, bowel sounds active all four quadrants,  no masses,   Extremities: Extremities normal, atraumatic, no  edema  Pulses: 2+ and symmetric    Lab Results:  Recent Labs  11/20/16 0306 11/21/16 0720  NA 132* 136  K 3.3* 4.3  CL 100* 103  CO2 22 27  GLUCOSE 88 109*  BUN 9 8  CREATININE 0.79 0.95  CALCIUM 7.8* 8.6*  MG 1.4*  --   PHOS 3.6  --     Recent Labs  11/19/16 1943 11/20/16 0306  AST 252* 221*  ALT 131* 112*  ALKPHOS 750* 714*  BILITOT 12.5* 11.8*  PROT 6.6 6.1*  ALBUMIN 2.5* 2.2*    Recent Labs  11/19/16 1943 11/20/16 0306  WBC 7.4 5.8  HGB 11.2* 10.7*  HCT 33.3* 31.8*  MCV 89.8 90.6  PLT 198 203    Recent Labs  11/19/16 2346  LABPROT 15.9*  INR 1.27      Assessment/Plan: - Abnormal LFTs. Differential diagnosis would be alcoholic hepatitis versus medication induced. Discriminant function score less than 32. Negative CT scan.US  Showed Mild Hepatic Steatosis. CBD of 4.8 Mm - Diarrhea. Stool studies pending. - Chronic anemia. Occult blood positive stool. - Alcohol use with a positive blood alcohol level  on admission  Recommendations ------------------------- - Follow secondary markers for liver disease. Not a candidate for prednisone because of the low discriminate function score. Recommend conservative management for now. Absolute alcohol abstinence otherwise. Monitor LFTs and INR. GI will follow  [name] MD, [place] 11/21/2016, 9:37 AM  Pager [phone]  If no answer or after 5 PM call [phone]

## 2016-11-21 NOTE — Progress Notes (Signed)
   Subjective: Mr. Moffatt was sleeping upon entering the room. He was easily awakened but claimed to have experienced a decent nights rest. His ongoing complaint lies in his continual diarrhea, which has lessened significantly since admission to only two loose watery bowel movements overnight. He denied complaints of pain, denied headache, nausea, vomiting, chest pain, or muscle aches. He did attest to improving mild abdominal pain throughout the upper abdomen. He was in agreement with the plan to continue the evaluation of his jaundice/transaminitis and diarrhea.  Objective:  Vital signs in last 24 hours: Vitals:   11/20/16 1706 11/20/16 2151 11/21/16 0551 11/21/16 1021  BP: (!) 152/89 (!) 146/93 (!) 140/93 (!) 130/96  Pulse: 79 89 89 96  Resp: _0 Temp: 98.1 F (36.7 C) 98.6 F (37 C) 98.4 F (36.9 C) 98.7 F (37.1 C)  TempSrc: Oral Oral Oral Oral  SpO2: 100% 100% 100% 100%  Weight:  144 lb 8 oz (65.5 kg)    Height:       Complete ROS negative except as per subjective.  Physical Exam  Constitutional: He is oriented to person, place, and time. No distress.  Appear to be of average weight or slightly under weight.   HENT:  Head: Normocephalic and atraumatic.  Mouth/Throat: No oropharyngeal exudate.  Eyes: EOM are normal. Scleral icterus is present.  Neck: Normal range of motion. Neck supple. No tracheal deviation present.  Cardiovascular: Normal rate and regular rhythm.   No murmur heard. Pulmonary/Chest: Effort normal and breath sounds normal. No stridor. No respiratory distress. He has no wheezes.  Abdominal: Soft. Bowel sounds are normal. He exhibits no distension. There is tenderness. There is no rebound and no guarding.  Musculoskeletal: He exhibits no edema.  Neurological: He is alert and oriented to person, place, and time.  Skin: Skin is warm and dry. Capillary refill takes less than 2 seconds. He is not diaphoretic.  Psychiatric: He has a normal mood and affect.  His behavior is normal.   Assessment/Plan:  Principal Problem:   Jaundice Active Problems:   Alcohol abuse   Essential hypertension   Coronary artery disease   Hyperlipidemia   Transaminitis   Esophagitis   Diarrhea   Serum total bilirubin elevated  Jaundice/transaminitis/elevated bilirubin/alcohol abuse: -Patient presented with AST 221 and ALT 112, alk phos 714 and total bili 11.8 -GI consult contributed the LFT's to prominent cholestatic dysfunction over possible hepatocellular -Will continue to evaluate with viral panel and autoimmune testing for a more definitive diagnosis -alpha 1 antitrypsin pending, ceruloplasmin pending -Transaminitis improved AST 165, ALT 91 with alk phos up to 739 -BMET unremarkable   Diarrhea: -Patient positive for occult blood -Fecal lactoferrin pending -C. Diff pending -Will consider infectious stool panel following C. Diff results   Essential hypertension: -Pressures slightly elevated -Restarted home dose of carvedilol 81m BID  Esophagitis: -Patient denied pain with swallowing today  Diet: Soft Fluids: n/a DVT ppx: enoxaparin 429mCode: Full Dispo: Anticipated discharge in approximately 1-2 day(s).   HaKathi LudwigMD 11/21/2016, 10:38 AM Pager: Pager# [phone]

## 2016-11-22 ENCOUNTER — Telehealth: Payer: Self-pay

## 2016-11-22 DIAGNOSIS — I2510 Atherosclerotic heart disease of native coronary artery without angina pectoris: Secondary | ICD-10-CM

## 2016-11-22 DIAGNOSIS — R7989 Other specified abnormal findings of blood chemistry: Secondary | ICD-10-CM

## 2016-11-22 DIAGNOSIS — E785 Hyperlipidemia, unspecified: Secondary | ICD-10-CM

## 2016-11-22 DIAGNOSIS — R740 Nonspecific elevation of levels of transaminase and lactic acid dehydrogenase [LDH]: Secondary | ICD-10-CM

## 2016-11-22 DIAGNOSIS — K210 Gastro-esophageal reflux disease with esophagitis: Secondary | ICD-10-CM

## 2016-11-22 DIAGNOSIS — R1011 Right upper quadrant pain: Secondary | ICD-10-CM

## 2016-11-22 DIAGNOSIS — F1010 Alcohol abuse, uncomplicated: Secondary | ICD-10-CM

## 2016-11-22 DIAGNOSIS — R17 Unspecified jaundice: Secondary | ICD-10-CM

## 2016-11-22 DIAGNOSIS — I10 Essential (primary) hypertension: Secondary | ICD-10-CM

## 2016-11-22 DIAGNOSIS — R197 Diarrhea, unspecified: Secondary | ICD-10-CM

## 2016-11-22 LAB — COMPREHENSIVE METABOLIC PANEL
ALT: 77 U/L — ABNORMAL HIGH (ref 17–63)
AST: 130 U/L — ABNORMAL HIGH (ref 15–41)
Albumin: 2 g/dL — ABNORMAL LOW (ref 3.5–5.0)
Alkaline Phosphatase: 674 U/L — ABNORMAL HIGH (ref 38–126)
Anion gap: 7 (ref 5–15)
BUN: 8 mg/dL (ref 6–20)
CO2: 22 mmol/L (ref 22–32)
Calcium: 8.5 mg/dL — ABNORMAL LOW (ref 8.9–10.3)
Chloride: 103 mmol/L (ref 101–111)
Creatinine, Ser: 0.83 mg/dL (ref 0.61–1.24)
GFR calc Af Amer: >60 mL/min (ref >60)
GFR calc non Af Amer: >60 mL/min (ref >60)
Glucose, Bld: 97 mg/dL (ref 65–99)
Potassium: 3.9 mmol/L (ref 3.5–5.1)
Sodium: 132 mmol/L — ABNORMAL LOW (ref 135–145)
Total Bilirubin: 9.5 mg/dL — ABNORMAL HIGH (ref 0.3–1.2)
Total Protein: 5.8 g/dL — ABNORMAL LOW (ref 6.5–8.1)

## 2016-11-22 LAB — HEPATITIS PANEL, ACUTE
HCV Ab: 0.1 s/co ratio (ref 0.0–0.9)
HEP A IGM: NEGATIVE
HEP B C IGM: NEGATIVE
Hepatitis B Surface Ag: NEGATIVE

## 2016-11-22 LAB — HIV ANTIBODY (ROUTINE TESTING W REFLEX): HIV Screen 4th Generation wRfx: NONREACTIVE

## 2016-11-22 LAB — HEPATITIS C ANTIBODY

## 2016-11-22 LAB — HEPATITIS B CORE ANTIBODY, IGM: HEP B C IGM: NEGATIVE

## 2016-11-22 LAB — CERULOPLASMIN: CERULOPLASMIN: 13.8 mg/dL — AB (ref 16.0–31.0)

## 2016-11-22 LAB — PROTIME-INR
INR: 1.06
Prothrombin Time: 13.8 seconds (ref 11.4–15.2)

## 2016-11-22 LAB — ANTI-SMOOTH MUSCLE ANTIBODY, IGG: F-ACTIN AB IGG: 16 U (ref 0–19)

## 2016-11-22 LAB — ALPHA-1-ANTITRYPSIN: A-1 Antitrypsin, Ser: 126 mg/dL (ref 90–200)

## 2016-11-22 LAB — MITOCHONDRIAL ANTIBODIES: Mitochondrial M2 Ab, IgG: 6.2 Units (ref 0.0–20.0)

## 2016-11-22 NOTE — Progress Notes (Signed)
   Subjective: Devin [name] was resting comfortably in bed this morning. He was glad to see the team this morning as he thought he felt better. He denied fever, nausea, vomiting, diarrhea, constipation, chills, or muscle aches. Still persists that he has mild inconsistent abdominal pain and occasional loose stool in the early morning hours. Able to tolerate PO intake well without concerns. Nursing staff was not aware of any loose stool adding that a sample was sent to the lab for C/diff eval but was formed prompting rejection of the sample. Patient is in agreement with being discharged today with follow-up as an outpatient for his anemia and transaminitis.   Objective:  Vital signs in last 24 hours: Vitals:   11/21/16 1824 11/21/16 2213 11/22/16 0420 11/22/16 0915  BP: (!) 127/94 110/88 100/66 128/86  Pulse: 97 81 77 85  Resp: 20 18 18 18  Temp: 98.6 F (37 C) 98.5 F (36.9 C) 98.7 F (37.1 C) 98.5 F (36.9 C)  TempSrc: Oral Oral Oral Oral  SpO2: 100% 100% 99% 100%  Weight:  148 lb 14.4 oz (67.5 kg)    Height:       Complete ROS negative except as per subjective.  Physical Exam  Constitutional: He is oriented to person, place, and time.  Eyes: Right eye exhibits no discharge. Left eye exhibits no discharge. Scleral icterus is present.  Cardiovascular: Normal rate, regular rhythm and intact distal pulses.   Pulmonary/Chest: Effort normal and breath sounds normal. No respiratory distress. He has no wheezes.  Abdominal: Soft. Bowel sounds are normal. He exhibits no distension and no mass. There is tenderness (Inconsistent generalized tenderss without consistent point tenderness ). There is no rebound and no guarding.  Musculoskeletal: Normal range of motion. He exhibits no edema, tenderness or deformity.  Neurological: He is alert and oriented to person, place, and time.  Skin: Skin is warm and dry. Capillary refill takes less than 2 seconds.  Psychiatric: He has a normal mood and affect.  His behavior is normal.    Assessment/Plan:  Principal Problem:   Jaundice Active Problems:   Alcohol abuse   Essential hypertension   Coronary artery disease   Hyperlipidemia   Transaminitis   Esophagitis   Diarrhea   Serum total bilirubin elevated  Jaundice/transaminitis/elevated bilirubin/alcohol abuse: -GI recommended follow-up as outpatient with PCP in 1wk and GI in 2-4wks -Viral panel negative, other labs pending negative, will follow up as outpatient  -alpha 1 antitrypsin negative, ceruloplasmin negative -Transaminitis improved AST 130, ALT 91 with alk phos 674 all improved -BMET unremarkable except for mild hyponatremia  Diarrhea: -Patient positive for occult blood -Fecal lactoferrin canceled -C. Diff canceled -Resolved  Essential hypertension: -Pressures slightly elevated -Restarted home dose of carvedilol 25mg BID  Dispo: Anticipated discharge in approximately-today.   , , MD 11/22/2016, 1:10 PM Pager: Pager# [phone]  

## 2016-11-22 NOTE — Telephone Encounter (Signed)
Hospital TOC per Dr Ermalene Postin, discharge 11/22/2016, appt 11/29/2016 @ 9:45.

## 2016-11-22 NOTE — Progress Notes (Signed)
Patient discharged to home and ambulated off the unit in stable condition. Patients discharge instructions reviewed with patient. Scripts reviewed. Medications returned to patient from Pharmacy. All belongings with patient.  Sheliah Plane RN

## 2016-11-22 NOTE — Care Management Note (Signed)
Case Management Note  Patient Details  Name: Devin [name] MRN: [id number] Date of Birth: 1960-11-22  Subjective/Objective:                 Spoke w patient at the bedside. He states he has received his orange card. He gets his medications delivered from [place] to his [place]. He is homeless, he is currently working on getting housing. He states he will need a bus pass for DC, CSW has provided that to nurse. He will follow up w IM clinic.    Action/Plan:  DC anticipated today.  Expected Discharge Date:  11/24/16               Expected Discharge Plan:  Home/Self Care  In-House Referral:  Clinical Social Work  Discharge planning Services  CM Consult  Post Acute Care Choice:    Choice offered to:     DME Arranged:    DME Agency:     HH Arranged:    HH Agency:     Status of Service:  Completed, signed off  If discussed at [employer], dates discussed:    Additional Comments:  Carles Collet, RN 11/22/2016, 11:37 AM

## 2016-11-22 NOTE — Discharge Summary (Signed)
Name: Devin [name] MRN: [id number] DOB: Sep 10, 1960 56 y.o. PCP: Katherine Roan, MD  Date of Admission: 11/19/2016  7:43 PM Date of Discharge: 11/22/2016 Attending Physician: No att. providers found  Discharge Diagnosis: Principal Problem:   Jaundice Active Problems:   Alcohol abuse   Essential hypertension   Coronary artery disease   Hyperlipidemia   Transaminitis   Esophagitis   Diarrhea   Serum total bilirubin elevated   Discharge Medications: Allergies as of 11/22/2016   No Known Allergies     Medication List    STOP taking these medications   atenolol 100 MG tablet Commonly known as:  TENORMIN     TAKE these medications   aspirin EC 81 MG tablet Take 1 tablet (81 mg total) by mouth daily. What changed:  how much to take   atorvastatin 40 MG tablet Commonly known as:  LIPITOR Take 1 tablet (40 mg total) by mouth daily.   carvedilol 25 MG tablet Commonly known as:  COREG Take 1 tablet (25 mg total) by mouth 2 (two) times daily with a meal.   cyclobenzaprine 10 MG tablet Commonly known as:  FLEXERIL Take 1 tablet (10 mg total) by mouth 2 (two) times daily as needed for muscle spasms.   nitroGLYCERIN 0.4 MG SL tablet Commonly known as:  NITROSTAT Place 0.4 mg under the tongue every 5 (five) minutes as needed for chest pain.   omeprazole 20 MG capsule Commonly known as:  PRILOSEC Take 1 capsule (20 mg total) by mouth 2 (two) times daily before a meal.       Disposition and follow-up:   Devin [name] was discharged from [facility] in Good condition.  At the hospital follow up visit please address:  1.  Please follow-up with patient for his anemia, resolution of his elevated alk-phos and transaminitis.    2.  Labs / imaging needed at time of follow-up: CMP, CBC, fecal occult blood testing  3.  Pending labs/ test needing follow-up: Anti-smooth muscle antibody, Mitochondrial antibody both from 11/21/2016  Follow-up  Appointments: Follow-up Information    [name], MD Follow up.   Specialty:  Gastroenterology Why:  Please call physician and schedule an appointment within 2-4 weeks of discharge. Contact information: [address] [place] [address] [phone]        [place] Follow up.   Why:  Within one week Contact information: [address] [place] Hospital Course by problem list: Principal Problem:   Jaundice Active Problems:   Alcohol abuse   Essential hypertension   Coronary artery disease   Hyperlipidemia   Transaminitis   Esophagitis   Diarrhea   Serum total bilirubin elevated  Devin [name] is a pleasant 56 year old male who presented to the ED with complaints of diffuse abdominal pain, non bilious non bloody emesis, and diarrhea of one week duration. He has a history significant for EtOH abuse, HTN, HLD, GERD, polysubstance abuse, and esophagitis.  Jaundice/Transaminitis/Elevated total bilirubin/RUQ Abdomin pain  He stated that the abdominal pain and 4-5 bowel movements per day for one week. The pain does not radiate and is predominantly located in the RUQ/mid epigastric region. He denied chest pain, SOB, palpitations, diaphoresis, lightheadedness, syncope, focal weakness or vision changes. However, he attested to generalized mild weakness and weight loss of 5lbs for the past several weeks. There was also report of 2  or more episodes of dark urine in that time.  CMP revealed elevated transaminases, alk phos, total bili, anemia, with CT of the abdomen and pelvis negative for cirrhosis or other hepatic abnormality. Gastroenterology(GI) was consulted and determined that the presentation was most likely secondary to the patients alcoholism vs medication induced from recent antibiotic use and deferred additional imaging pending potential resolution of his symptoms and lab abnormalities.   Nevertheless, they decided to further evaluate for other potential causes and ordered viral hepatitis panels as well as autoimmune testing. Stool studies were initially ordered but when finally collected, the stool was formed prompting refusal for evaluation for C. Diff by the lab. As the diarrhea had resolved, and as the patient remained clinically free of symptoms related to infectious diarrhea, other stool studies were deferred/suspended.  The patients viral panel was unremarkable, as well as the ceruloplasmin, and alpha-1-antitrypsin. His autoimmune panel was pending final resolution as noted above. Given the patients prominent recovery clinically as well as the decrease in his AST, ALT and alk phos, GI agreed to discharging the patient with follow-up with the primary clinic in one week and with GI in 2-4 weeks.   Disposition: Patient was discharged to self care in good condition.   Discharge Vitals:   BP 128/86 (BP Location: Left Arm)   Pulse 85   Temp 98.5 F (36.9 C) (Oral)   Resp 18   Ht '5\' 10"'  (1.778 m)   Wt 148 lb 14.4 oz (67.5 kg)   SpO2 100%   BMI 21.36 kg/m   Pertinent Labs, Studies, and Procedures:  CMP Latest Ref Rng & Units 11/22/2016 11/21/2016 11/20/2016  Glucose 65 - 99 mg/dL 97 109(H) 88  BUN 6 - 20 mg/dL '8 8 9  ' Creatinine 0.61 - 1.24 mg/dL 0.83 0.95 0.79  Sodium 135 - 145 mmol/L 132(L) 136 132(L)  Potassium 3.5 - 5.1 mmol/L 3.9 4.3 3.3(L)  Chloride 101 - 111 mmol/L 103 103 100(L)  CO2 22 - 32 mmol/L '22 27 22  ' Calcium 8.9 - 10.3 mg/dL 8.5(L) 8.6(L) 7.8(L)  Total Protein 6.5 - 8.1 g/dL 5.8(L) 5.8(L) 6.1(L)  Total Bilirubin 0.3 - 1.2 mg/dL 9.5(H) 11.1(H) 11.8(H)  Alkaline Phos 38 - 126 U/L 674(H) 739(H) 714(H)  AST 15 - 41 U/L 130(H) 165(H) 221(H)  ALT 17 - 63 U/L 77(H) 91(H) 112(H)   CBC Latest Ref Rng & Units 11/20/2016 11/20/2016 11/19/2016  WBC 4.0 - 10.5 K/uL 5.8 - 7.4  Hemoglobin 13.0 - 17.0 g/dL 10.7(L) - 11.2(L)  Hematocrit 39.0 - 52.0 % 31.8(L) 30.2(L) 33.3(L)   Platelets 150 - 400 K/uL 203 - 198    Discharge Instructions: Discharge Instructions    Call MD for:  persistant dizziness or light-headedness    Complete by:  As directed    Diet - low sodium heart healthy    Complete by:  As directed    Increase activity slowly    Complete by:  As directed       Signed: Kathi Ludwig, MD 11/22/2016, 2:46 PM   Pager: Pager# [phone]

## 2016-11-22 NOTE — Progress Notes (Signed)
[facility] Gastroenterology Progress Note  Devin [name] 56 y.o. 1961/04/16  CC:  Elevated LFTs   Subjective: No acute issues overnight. Tolerating diet. Discussed with the nursing staff. Patient is having formed stools now. Patient denied any blood in the stool or black stool. Denied any fever or chills.  ROS : Negative for chest pain and shortness of breath.   Objective: Vital signs in last 24 hours: Vitals:   11/22/16 0420 11/22/16 0915  BP: 100/66 128/86  Pulse: 77 85  Resp: 18 18  Temp: 98.7 F (37.1 C) 98.5 F (36.9 C)    Physical Exam:  General:  Alert, cooperative, no distress, appears stated age  Head:  Normocephalic, without obvious abnormality, atraumatic  Eyes:   icterus noted   Lungs:   Clear to auscultation bilaterally, respirations unlabored  Heart:  Regular rate and rhythm, S1, S2 normal  Abdomen:   Soft Right upper quadrant and epigastric tenderness to palpation without any peritoneal signs, bowel sounds active all four quadrants,  no masses,   Extremities: Extremities normal, atraumatic, no  edema  Pulses: 2+ and symmetric    Lab Results:  Recent Labs  11/20/16 0306 11/21/16 0720 11/22/16 0734  NA 132* 136 132*  K 3.3* 4.3 3.9  CL 100* 103 103  CO2 22 27 22   GLUCOSE 88 109* 97  BUN 9 8 8   CREATININE 0.79 0.95 0.83  CALCIUM 7.8* 8.6* 8.5*  MG 1.4*  --   --   PHOS 3.6  --   --     Recent Labs  11/21/16 0951 11/22/16 0734  AST 165* 130*  ALT 91* 77*  ALKPHOS 739* 674*  BILITOT 11.1* 9.5*  PROT 5.8* 5.8*  ALBUMIN 2.0* 2.0*    Recent Labs  11/19/16 1943 11/20/16 0029 11/20/16 0306  WBC 7.4  --  5.8  HGB 11.2*  --  10.7*  HCT 33.3* 30.2* 31.8*  MCV 89.8  --  90.6  PLT 198  --  203    Recent Labs  11/19/16 2346 11/22/16 0734  LABPROT 15.9* 13.8  INR 1.27 1.06      Assessment/Plan: - Abnormal LFTs. Differential diagnosis would be alcoholic hepatitis versus medication induced. Discriminant function score less than 32.  Negative CT scan.US  Showed Mild Hepatic Steatosis. CBD of 4.8 mm - Diarrhea. Improving. Having formed stool according to nursing staff. - Chronic anemia. Occult blood positive stool. - Alcohol use with a positive blood alcohol level on admission  Recommendations ------------------------- - Patient's LFTs are improving. No signs of encephalopathy. Normal INR. - Hepatitis panel negative. Normal alpha-1 antitrypsin level - Patient has a low ceruloplasmin liver as well as elevated iron saturation. - Patient is getting discharged today. Recommend follow-up in GI clinic in 2-4 weeks. Recommend monitoring of hepatic function panel in 1 week by primary care physician. Recommend outpatient workup for anemia and occult blood positive stool. - GI will sign off. Call us back if needed  [name] MD, [place] 11/22/2016, 11:31 AM  Pager [phone]  If no answer or after 5 PM call [phone]

## 2016-11-24 LAB — OSMOLALITY: OSMOLALITY: 325 mosm/kg — AB (ref 275–295)

## 2016-11-29 ENCOUNTER — Ambulatory Visit: Payer: Self-pay

## 2016-12-06 ENCOUNTER — Encounter (HOSPITAL_COMMUNITY): Payer: Self-pay

## 2016-12-06 ENCOUNTER — Emergency Department (HOSPITAL_COMMUNITY): Payer: Self-pay

## 2016-12-06 ENCOUNTER — Emergency Department (HOSPITAL_COMMUNITY)
Admission: EM | Admit: 2016-12-06 | Discharge: 2016-12-06 | Disposition: A | Discharge status: Home / Self Care | Payer: Self-pay | Attending: Emergency Medicine | Admitting: Emergency Medicine

## 2016-12-06 DIAGNOSIS — Z7982 Long term (current) use of aspirin: Secondary | ICD-10-CM | POA: Insufficient documentation

## 2016-12-06 DIAGNOSIS — I2510 Atherosclerotic heart disease of native coronary artery without angina pectoris: Secondary | ICD-10-CM | POA: Insufficient documentation

## 2016-12-06 DIAGNOSIS — F17210 Nicotine dependence, cigarettes, uncomplicated: Secondary | ICD-10-CM | POA: Insufficient documentation

## 2016-12-06 DIAGNOSIS — I10 Essential (primary) hypertension: Secondary | ICD-10-CM | POA: Insufficient documentation

## 2016-12-06 DIAGNOSIS — R51 Headache: Secondary | ICD-10-CM | POA: Insufficient documentation

## 2016-12-06 DIAGNOSIS — E860 Dehydration: Secondary | ICD-10-CM | POA: Insufficient documentation

## 2016-12-06 DIAGNOSIS — I252 Old myocardial infarction: Secondary | ICD-10-CM | POA: Insufficient documentation

## 2016-12-06 DIAGNOSIS — Z79899 Other long term (current) drug therapy: Secondary | ICD-10-CM | POA: Insufficient documentation

## 2016-12-06 DIAGNOSIS — R519 Headache, unspecified: Secondary | ICD-10-CM

## 2016-12-06 LAB — COMPREHENSIVE METABOLIC PANEL
ALT: 57 U/L (ref 17–63)
AST: 101 U/L — ABNORMAL HIGH (ref 15–41)
Albumin: 2.7 g/dL — ABNORMAL LOW (ref 3.5–5.0)
Alkaline Phosphatase: 650 U/L — ABNORMAL HIGH (ref 38–126)
Anion gap: 10 (ref 5–15)
BUN: <5 mg/dL — ABNORMAL LOW (ref 6–20)
CHLORIDE: 98 mmol/L — AB (ref 101–111)
CO2: 24 mmol/L (ref 22–32)
CREATININE: 0.9 mg/dL (ref 0.61–1.24)
Calcium: 8.9 mg/dL (ref 8.9–10.3)
GFR calc Af Amer: >60 mL/min (ref >60)
GFR calc non Af Amer: >60 mL/min (ref >60)
GLUCOSE: 95 mg/dL (ref 65–99)
Potassium: 4.1 mmol/L (ref 3.5–5.1)
Sodium: 132 mmol/L — ABNORMAL LOW (ref 135–145)
Total Bilirubin: 3.1 mg/dL — ABNORMAL HIGH (ref 0.3–1.2)
Total Protein: 7.4 g/dL (ref 6.5–8.1)

## 2016-12-06 LAB — CBC WITH DIFFERENTIAL/PLATELET
BASOS PCT: 1 %
Basophils Absolute: 0.1 10*3/uL (ref 0.0–0.1)
EOS ABS: 0 10*3/uL (ref 0.0–0.7)
Eosinophils Relative: 0 %
HCT: 31.5 % — ABNORMAL LOW (ref 39.0–52.0)
Hemoglobin: 10.3 g/dL — ABNORMAL LOW (ref 13.0–17.0)
LYMPHS PCT: 26 %
Lymphs Abs: 2 10*3/uL (ref 0.7–4.0)
MCH: 32.8 pg (ref 26.0–34.0)
MCHC: 32.7 g/dL (ref 30.0–36.0)
MCV: 100.3 fL — ABNORMAL HIGH (ref 78.0–100.0)
MONO ABS: 0.7 10*3/uL (ref 0.1–1.0)
Monocytes Relative: 9 %
NEUTROS PCT: 64 %
Neutro Abs: 4.8 10*3/uL (ref 1.7–7.7)
PLATELETS: 405 10*3/uL — AB (ref 150–400)
RBC: 3.14 MIL/uL — AB (ref 4.22–5.81)
RDW: 22 % — ABNORMAL HIGH (ref 11.5–15.5)
WBC: 7.6 10*3/uL (ref 4.0–10.5)

## 2016-12-06 LAB — URINALYSIS, ROUTINE W REFLEX MICROSCOPIC
BILIRUBIN URINE: NEGATIVE
Glucose, UA: NEGATIVE mg/dL
HGB URINE DIPSTICK: NEGATIVE
KETONES UR: NEGATIVE mg/dL
Leukocytes, UA: NEGATIVE
NITRITE: NEGATIVE
Protein, ur: NEGATIVE mg/dL
SPECIFIC GRAVITY, URINE: 1.014 (ref 1.005–1.030)
pH: 6 (ref 5.0–8.0)

## 2016-12-06 LAB — ETHANOL

## 2016-12-06 LAB — I-STAT TROPONIN, ED: Troponin i, poc: 0.01 ng/mL (ref 0.00–0.08)

## 2016-12-06 LAB — I-STAT CG4 LACTIC ACID, ED: Lactic Acid, Venous: 1.76 mmol/L (ref 0.5–1.9)

## 2016-12-06 LAB — LIPASE, BLOOD: Lipase: 35 U/L (ref 11–51)

## 2016-12-06 MED ORDER — KETOROLAC TROMETHAMINE 30 MG/ML IJ SOLN
15.0000 mg | Freq: Once | INTRAMUSCULAR | Status: AC
  Administered 2016-12-06: 15 mg via INTRAVENOUS
  Filled 2016-12-06: qty 1

## 2016-12-06 MED ORDER — SODIUM CHLORIDE 0.9 % IV BOLUS (SEPSIS)
1000.0000 mL | Freq: Once | INTRAVENOUS | Status: AC
  Administered 2016-12-06: 1000 mL via INTRAVENOUS

## 2016-12-06 NOTE — Discharge Instructions (Signed)
Drink plenty of fluids. Follow up with family doctor as needed.

## 2016-12-06 NOTE — ED Triage Notes (Signed)
Pt brought in by EMS due to having dizziness and HA x2 days. Pt not been able to take BP meds and hx of ETOH use. Pt a&ox4.

## 2016-12-06 NOTE — ED Notes (Signed)
Patient transported to X-ray 
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
## 2016-12-13 NOTE — Telephone Encounter (Signed)
No answer when called 

## 2016-12-25 ENCOUNTER — Encounter: Payer: Self-pay | Admitting: Internal Medicine

## 2016-12-25 ENCOUNTER — Ambulatory Visit (INDEPENDENT_AMBULATORY_CARE_PROVIDER_SITE_OTHER): Payer: Self-pay | Admitting: Internal Medicine

## 2016-12-25 VITALS — BP 143/97 | HR 107 | Temp 98.0°F | Ht 70.0 in | Wt 148.9 lb

## 2016-12-25 DIAGNOSIS — F329 Major depressive disorder, single episode, unspecified: Secondary | ICD-10-CM

## 2016-12-25 DIAGNOSIS — D539 Nutritional anemia, unspecified: Secondary | ICD-10-CM

## 2016-12-25 DIAGNOSIS — F331 Major depressive disorder, recurrent, moderate: Secondary | ICD-10-CM

## 2016-12-25 DIAGNOSIS — F419 Anxiety disorder, unspecified: Secondary | ICD-10-CM

## 2016-12-25 DIAGNOSIS — K760 Fatty (change of) liver, not elsewhere classified: Secondary | ICD-10-CM

## 2016-12-25 DIAGNOSIS — E785 Hyperlipidemia, unspecified: Secondary | ICD-10-CM

## 2016-12-25 DIAGNOSIS — D649 Anemia, unspecified: Secondary | ICD-10-CM | POA: Insufficient documentation

## 2016-12-25 DIAGNOSIS — Z79899 Other long term (current) drug therapy: Secondary | ICD-10-CM

## 2016-12-25 DIAGNOSIS — R000 Tachycardia, unspecified: Secondary | ICD-10-CM

## 2016-12-25 DIAGNOSIS — E781 Pure hyperglyceridemia: Secondary | ICD-10-CM

## 2016-12-25 DIAGNOSIS — I10 Essential (primary) hypertension: Secondary | ICD-10-CM

## 2016-12-25 DIAGNOSIS — F1010 Alcohol abuse, uncomplicated: Secondary | ICD-10-CM

## 2016-12-25 DIAGNOSIS — Z590 Homelessness: Secondary | ICD-10-CM

## 2016-12-25 DIAGNOSIS — F17210 Nicotine dependence, cigarettes, uncomplicated: Secondary | ICD-10-CM

## 2016-12-25 LAB — COMPREHENSIVE METABOLIC PANEL
ALBUMIN: 3.8 g/dL (ref 3.5–5.0)
ALT: 73 U/L — AB (ref 17–63)
AST: 148 U/L — AB (ref 15–41)
Alkaline Phosphatase: 351 U/L — ABNORMAL HIGH (ref 38–126)
Anion gap: 9 (ref 5–15)
BILIRUBIN TOTAL: 1.8 mg/dL — AB (ref 0.3–1.2)
BUN: 11 mg/dL (ref 6–20)
CO2: 24 mmol/L (ref 22–32)
CREATININE: 0.91 mg/dL (ref 0.61–1.24)
Calcium: 9.8 mg/dL (ref 8.9–10.3)
Chloride: 102 mmol/L (ref 101–111)
GFR calc Af Amer: >60 mL/min (ref >60)
GLUCOSE: 95 mg/dL (ref 65–99)
Potassium: 4.3 mmol/L (ref 3.5–5.1)
Sodium: 135 mmol/L (ref 135–145)
TOTAL PROTEIN: 8.7 g/dL — AB (ref 6.5–8.1)

## 2016-12-25 MED ORDER — METOPROLOL SUCCINATE ER 100 MG PO TB24: 100.0000 mg | ORAL_TABLET | Freq: Every day | ORAL | 2 refills | Status: DC

## 2016-12-25 MED ORDER — SERTRALINE HCL 50 MG PO TABS: 50.0000 mg | ORAL_TABLET | Freq: Every day | ORAL | 2 refills | Status: DC
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
## 2016-12-25 NOTE — Assessment & Plan Note (Signed)
Pt reports symptoms of depression and anxiety, he denies suicidal or homicidal ideations, he says it's very difficult being homeless and he has been very depressed about it lately.  He was given a prescription by a psychiatrist in [place] for prozac about 3 years ago and was never able to fill it.  -start low dose SSRI possibly prozac.
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
## 2016-12-25 NOTE — Assessment & Plan Note (Addendum)
Last lipid panel showed extremely high TG's and low HDL this was done during his hepatitis workup in the hospital in July 2018 and was likely secondary to alcohol abuse.    -Currently taking atorvastatin 40mg   -Continue atorvastatin 40mg 

## 2016-12-25 NOTE — Patient Instructions (Signed)
Please begin new medications as prescribed and call if any questions, we will check your liver function and blood counts and call you if a referral to GI is necessary.  Please continue to try and abstain from alcohol use

## 2016-12-25 NOTE — Assessment & Plan Note (Addendum)
Pt has been drinking since he was 56 years old and it has been increasing as he aged.  Says he really started drinking hard when he was in his 16's.  --Pt reports cutting back alcohol use to about 12 oz of beer with some meals, since last hospitilization in July 2018 where he drank for 3 days straight and did not eat just prior to hospitilization. -Before this pt was drinking about a pint of liquor a day and more on "special occasions" -Pt has been charged with having open container June 2018 and may go to court appointed alcohol abuse classes due to this.

## 2016-12-25 NOTE — Assessment & Plan Note (Addendum)
Pt reports not taking carvedilol due to dizziness, he mentions that the only BP medicine he would trust is atenolol, he mentions that he liked that medication and had no issues with it. He has been off atenolol 100mg  for one month.  -pt agreeable to trying metoprolol xL 100mg 

## 2016-12-26 LAB — CBC WITH DIFFERENTIAL/PLATELET
BASOS: 0 %
Basophils Absolute: 0 10*3/uL (ref 0.0–0.2)
EOS (ABSOLUTE): 0.1 10*3/uL (ref 0.0–0.4)
EOS: 2 %
HEMOGLOBIN: 12 g/dL — AB (ref 13.0–17.7)
Hematocrit: 37.5 % (ref 37.5–51.0)
IMMATURE GRANS (ABS): 0 10*3/uL (ref 0.0–0.1)
IMMATURE GRANULOCYTES: 0 %
LYMPHS: 26 %
Lymphocytes Absolute: 1.3 10*3/uL (ref 0.7–3.1)
MCH: 35.1 pg — AB (ref 26.6–33.0)
MCHC: 32 g/dL (ref 31.5–35.7)
MCV: 110 fL — AB (ref 79–97)
MONOCYTES: 18 %
Monocytes Absolute: 0.9 10*3/uL (ref 0.1–0.9)
NEUTROS ABS: 2.6 10*3/uL (ref 1.4–7.0)
Neutrophils: 54 %
PLATELETS: 353 10*3/uL (ref 150–379)
RBC: 3.42 x10E6/uL — ABNORMAL LOW (ref 4.14–5.80)
RDW: 17.3 % — ABNORMAL HIGH (ref 12.3–15.4)
WBC: 4.8 10*3/uL (ref 3.4–10.8)

## 2016-12-26 NOTE — Progress Notes (Signed)
Internal Medicine Clinic Attending  I saw and evaluated the patient.  I personally confirmed the key portions of the history and exam documented by Dr. Winfrey and I reviewed pertinent patient test results.  The assessment, diagnosis, and plan were formulated together and I agree with the documentation in the resident's note. 

## 2016-12-27 ENCOUNTER — Other Ambulatory Visit: Payer: Self-pay | Admitting: *Deleted

## 2016-12-27 ENCOUNTER — Telehealth: Payer: Self-pay | Admitting: *Deleted

## 2016-12-27 MED ORDER — METOPROLOL TARTRATE 50 MG PO TABS: 50.0000 mg | ORAL_TABLET | Freq: Two times a day (BID) | ORAL | 2 refills | Status: DC

## 2016-12-27 NOTE — Telephone Encounter (Signed)
review 

## 2016-12-27 NOTE — Telephone Encounter (Signed)
[facility] does not have metoprolol succinate, they have tartrate, can this be changed? Please send a new script by fax to [facility]

## 2016-12-27 NOTE — Telephone Encounter (Signed)
Called new script to [facility]

## 2017-01-29 ENCOUNTER — Encounter: Payer: Self-pay | Admitting: Internal Medicine

## 2017-01-29 ENCOUNTER — Ambulatory Visit (INDEPENDENT_AMBULATORY_CARE_PROVIDER_SITE_OTHER): Payer: Self-pay | Admitting: Internal Medicine

## 2017-01-29 VITALS — BP 138/87 | HR 117 | Temp 97.8°F | Ht 69.5 in | Wt 151.5 lb

## 2017-01-29 DIAGNOSIS — E785 Hyperlipidemia, unspecified: Secondary | ICD-10-CM

## 2017-01-29 DIAGNOSIS — Z79899 Other long term (current) drug therapy: Secondary | ICD-10-CM | POA: Insufficient documentation

## 2017-01-29 DIAGNOSIS — F329 Major depressive disorder, single episode, unspecified: Secondary | ICD-10-CM

## 2017-01-29 DIAGNOSIS — I10 Essential (primary) hypertension: Secondary | ICD-10-CM

## 2017-01-29 DIAGNOSIS — I252 Old myocardial infarction: Secondary | ICD-10-CM

## 2017-01-29 MED ORDER — METOPROLOL SUCCINATE ER 100 MG PO TB24: 100.0000 mg | ORAL_TABLET | Freq: Every day | ORAL | 0 refills | Status: DC

## 2017-01-29 NOTE — Patient Instructions (Addendum)
It was a pleasure to meet you [name].  I am resending a prescription of Metoprolol succinate (Toprol XL) 100 mg once daily to the [facility]. This medicine is for your heart and for blood pressure.  If there are any issues obtaining this, please call to let us know.  We are stopping the Carvedilol (Coreg).  You can continue your other medications as prescribed.  Please follow up with Dr. Shan Levans in about 4-6 weeks.   Metoprolol extended-release tablets What is this medicine? METOPROLOL (me TOE proe lole) is a beta-blocker. Beta-blockers reduce the workload on the heart and help it to beat more regularly. This medicine is used to treat high blood pressure and to prevent chest pain. It is also used to after a heart attack and to prevent an additional heart attack from occurring. This medicine may be used for other purposes; ask your health care provider or pharmacist if you have questions. COMMON BRAND NAME(S): toprol, Toprol XL What should I tell my health care provider before I take this medicine? They need to know if you have any of these conditions: -diabetes -heart or vessel disease like slow heart rate, worsening heart failure, heart block, sick sinus syndrome or Raynaud's disease -kidney disease -liver disease -lung or breathing disease, like asthma or emphysema -pheochromocytoma -thyroid disease -an unusual or allergic reaction to metoprolol, other beta-blockers, medicines, foods, dyes, or preservatives -pregnant or trying to get pregnant -breast-feeding How should I use this medicine? Take this medicine by mouth with a glass of water. Follow the directions on the prescription label. Do not crush or chew. Take this medicine with or immediately after meals. Take your doses at regular intervals. Do not take more medicine than directed. Do not stop taking this medicine suddenly. This could lead to serious heart-related effects. Talk to your pediatrician  regarding the use of this medicine in children. While this drug may be prescribed for children as young as 6 years for selected conditions, precautions do apply. Overdosage: If you think you have taken too much of this medicine contact a poison control center or emergency room at once. NOTE: This medicine is only for you. Do not share this medicine with others. What if I miss a dose? If you miss a dose, take it as soon as you can. If it is almost time for your next dose, take only that dose. Do not take double or extra doses. What may interact with this medicine? This medicine may interact with the following medications: -certain medicines for blood pressure, heart disease, irregular heart beat -certain medicines for depression, like monoamine oxidase (MAO) inhibitors, fluoxetine, or paroxetine -clonidine -dobutamine -epinephrine -isoproterenol -reserpine This list may not describe all possible interactions. Give your health care provider a list of all the medicines, herbs, non-prescription drugs, or dietary supplements you use. Also tell them if you smoke, drink alcohol, or use illegal drugs. Some items may interact with your medicine. What should I watch for while using this medicine? Visit your doctor or health care professional for regular check ups. Contact your doctor right away if your symptoms worsen. Check your blood pressure and pulse rate regularly. Ask your health care professional what your blood pressure and pulse rate should be, and when you should contact them. You may get drowsy or dizzy. Do not drive, use machinery, or do anything that needs mental alertness until you know how this medicine affects you. Do not sit or stand up quickly, especially if you are an  older patient. This reduces the risk of dizzy or fainting spells. Contact your doctor if these symptoms continue. Alcohol may interfere with the effect of this medicine. Avoid alcoholic drinks. What side effects may I notice  from receiving this medicine? Side effects that you should report to your doctor or health care professional as soon as possible: -allergic reactions like skin rash, itching or hives -cold or numb hands or feet -depression -difficulty breathing -faint -fever with sore throat -irregular heartbeat, chest pain -rapid weight gain -swollen legs or ankles Side effects that usually do not require medical attention (report to your doctor or health care professional if they continue or are bothersome): -anxiety or nervousness -change in sex drive or performance -dry skin -headache -nightmares or trouble sleeping -short term memory loss -stomach upset or diarrhea -unusually tired This list may not describe all possible side effects. Call your doctor for medical advice about side effects. You may report side effects to FDA at 1-800-FDA-1088. Where should I keep my medicine? Keep out of the reach of children. Store at room temperature between 15 and 30 degrees C (59 and 86 degrees F). Throw away any unused medicine after the expiration date. NOTE: This sheet is a summary. It may not cover all possible information. If you have questions about this medicine, talk to your doctor, pharmacist, or health care provider.  2018 Elsevier/Gold Standard (2012-12-20 14:41:37)

## 2017-01-29 NOTE — Progress Notes (Signed)
Internal Medicine Clinic Attending  Case discussed with Dr. Patel at the time of the visit.  We reviewed the resident's history and exam and pertinent patient test results.  I agree with the assessment, diagnosis, and plan of care documented in the resident's note.  

## 2017-01-29 NOTE — Assessment & Plan Note (Signed)
BP Readings from Last 3 Encounters:  01/29/17 138/87  12/25/16 (!) 143/97  12/06/16 (!) 150/99   Patient's main complaint is not tolerating Carvedilol and inability to obtain Toprol XL. He brought all of his medications today which I reviewed. I have resent a prescription for Toprol XL 100 mg 24 hr once daily, which he is still agreeable to try. I have kept his Carvedilol and discontinued it from his medication list so he does not double up on beta blockers. He will continue his other medications as prescribed. He will let us know if there are any issues obtaining his medications.
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
## 2017-02-11 ENCOUNTER — Emergency Department (HOSPITAL_COMMUNITY)
Admission: EM | Admit: 2017-02-11 | Discharge: 2017-02-12 | Disposition: A | Discharge status: Home / Self Care | Payer: Medicaid Other | Attending: Emergency Medicine | Admitting: Emergency Medicine

## 2017-02-11 ENCOUNTER — Other Ambulatory Visit: Payer: Self-pay

## 2017-02-11 ENCOUNTER — Encounter (HOSPITAL_COMMUNITY): Payer: Self-pay | Admitting: Emergency Medicine

## 2017-02-11 ENCOUNTER — Emergency Department (HOSPITAL_COMMUNITY): Payer: Medicaid Other

## 2017-02-11 DIAGNOSIS — I252 Old myocardial infarction: Secondary | ICD-10-CM | POA: Diagnosis not present

## 2017-02-11 DIAGNOSIS — K2920 Alcoholic gastritis without bleeding: Secondary | ICD-10-CM

## 2017-02-11 DIAGNOSIS — Z7982 Long term (current) use of aspirin: Secondary | ICD-10-CM | POA: Insufficient documentation

## 2017-02-11 DIAGNOSIS — Z79899 Other long term (current) drug therapy: Secondary | ICD-10-CM | POA: Diagnosis not present

## 2017-02-11 DIAGNOSIS — I259 Chronic ischemic heart disease, unspecified: Secondary | ICD-10-CM | POA: Insufficient documentation

## 2017-02-11 DIAGNOSIS — F17210 Nicotine dependence, cigarettes, uncomplicated: Secondary | ICD-10-CM | POA: Diagnosis not present

## 2017-02-11 DIAGNOSIS — I10 Essential (primary) hypertension: Secondary | ICD-10-CM | POA: Insufficient documentation

## 2017-02-11 DIAGNOSIS — R079 Chest pain, unspecified: Secondary | ICD-10-CM | POA: Diagnosis present

## 2017-02-11 DIAGNOSIS — E871 Hypo-osmolality and hyponatremia: Secondary | ICD-10-CM | POA: Diagnosis not present

## 2017-02-11 LAB — CBC
HEMATOCRIT: 36.4 % — AB (ref 39.0–52.0)
HEMOGLOBIN: 12.3 g/dL — AB (ref 13.0–17.0)
MCH: 32.7 pg (ref 26.0–34.0)
MCHC: 33.8 g/dL (ref 30.0–36.0)
MCV: 96.8 fL (ref 78.0–100.0)
Platelets: 243 10*3/uL (ref 150–400)
RBC: 3.76 MIL/uL — ABNORMAL LOW (ref 4.22–5.81)
RDW: 13.8 % (ref 11.5–15.5)
WBC: 4.9 10*3/uL (ref 4.0–10.5)

## 2017-02-11 LAB — I-STAT TROPONIN, ED: Troponin i, poc: 0.01 ng/mL (ref 0.00–0.08)

## 2017-02-11 NOTE — ED Triage Notes (Addendum)
Pt to triage via [facility] for sudden onset of L sided chest pain at 8am that radiates down L arm.  Vomited x 1 this morning.  Reports sob and nausea.  EMS administered NTG SL x 1 and ASA 324 mg without any changes.

## 2017-02-12 LAB — BASIC METABOLIC PANEL
Anion gap: 13 (ref 5–15)
BUN: 14 mg/dL (ref 6–20)
CHLORIDE: 96 mmol/L — AB (ref 101–111)
CO2: 16 mmol/L — AB (ref 22–32)
Calcium: 8.2 mg/dL — ABNORMAL LOW (ref 8.9–10.3)
Creatinine, Ser: 0.62 mg/dL (ref 0.61–1.24)
GFR calc Af Amer: >60 mL/min (ref >60)
GFR calc non Af Amer: >60 mL/min (ref >60)
GLUCOSE: 104 mg/dL — AB (ref 65–99)
POTASSIUM: 4.3 mmol/L (ref 3.5–5.1)
Sodium: 125 mmol/L — ABNORMAL LOW (ref 135–145)

## 2017-02-12 LAB — LIPASE, BLOOD: Lipase: 19 U/L (ref 11–51)

## 2017-02-12 LAB — HEPATIC FUNCTION PANEL
ALK PHOS: 500 U/L — AB (ref 38–126)
ALT: 90 U/L — AB (ref 17–63)
AST: 187 U/L — AB (ref 15–41)
Albumin: 3 g/dL — ABNORMAL LOW (ref 3.5–5.0)
BILIRUBIN DIRECT: 1.1 mg/dL — AB (ref 0.1–0.5)
BILIRUBIN INDIRECT: 1.2 mg/dL — AB (ref 0.3–0.9)
Total Bilirubin: 2.3 mg/dL — ABNORMAL HIGH (ref 0.3–1.2)
Total Protein: 7 g/dL (ref 6.5–8.1)

## 2017-02-12 MED ORDER — SUCRALFATE 1 G PO TABS: 1.0000 g | ORAL_TABLET | Freq: Three times a day (TID) | ORAL | 0 refills | Status: DC

## 2017-02-12 MED ORDER — OMEPRAZOLE 20 MG PO CPDR: 20.0000 mg | DELAYED_RELEASE_CAPSULE | Freq: Every day | ORAL | 0 refills | Status: DC

## 2017-02-12 MED ORDER — SODIUM CHLORIDE 0.9 % IV BOLUS (SEPSIS)
1000.0000 mL | Freq: Once | INTRAVENOUS | Status: AC
Start: 2017-02-12 — End: 2017-02-12
  Administered 2017-02-12: 1000 mL via INTRAVENOUS

## 2017-02-12 MED ORDER — GI COCKTAIL ~~LOC~~
30.0000 mL | Freq: Once | ORAL | Status: AC
  Administered 2017-02-12: 30 mL via ORAL
  Filled 2017-02-12: qty 30

## 2017-02-12 NOTE — ED Notes (Signed)
Pt given [place] sandwich, tolerating water

## 2017-02-12 NOTE — Discharge Instructions (Signed)
You were seen today for chest pain. This is likely related to your known reflux and gastritis. Alcohol will make this worse. Make sure that you are eating and drinking enough exercise and was somewhat low. Need to have repeat lab work in one week.
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
## 2017-02-24 ENCOUNTER — Emergency Department (HOSPITAL_COMMUNITY)
Admission: EM | Admit: 2017-02-24 | Discharge: 2017-02-25 | Disposition: A | Discharge status: Home / Self Care | Payer: Medicaid Other | Attending: Emergency Medicine | Admitting: Emergency Medicine

## 2017-02-24 ENCOUNTER — Encounter (HOSPITAL_COMMUNITY): Payer: Self-pay

## 2017-02-24 ENCOUNTER — Emergency Department (HOSPITAL_COMMUNITY): Payer: Medicaid Other

## 2017-02-24 DIAGNOSIS — I2510 Atherosclerotic heart disease of native coronary artery without angina pectoris: Secondary | ICD-10-CM | POA: Insufficient documentation

## 2017-02-24 DIAGNOSIS — F17210 Nicotine dependence, cigarettes, uncomplicated: Secondary | ICD-10-CM | POA: Diagnosis not present

## 2017-02-24 DIAGNOSIS — J00 Acute nasopharyngitis [common cold]: Secondary | ICD-10-CM | POA: Diagnosis not present

## 2017-02-24 DIAGNOSIS — I10 Essential (primary) hypertension: Secondary | ICD-10-CM | POA: Insufficient documentation

## 2017-02-24 DIAGNOSIS — Z79899 Other long term (current) drug therapy: Secondary | ICD-10-CM | POA: Insufficient documentation

## 2017-02-24 DIAGNOSIS — R05 Cough: Secondary | ICD-10-CM | POA: Diagnosis present

## 2017-02-24 DIAGNOSIS — E860 Dehydration: Secondary | ICD-10-CM | POA: Insufficient documentation

## 2017-02-24 DIAGNOSIS — Z7982 Long term (current) use of aspirin: Secondary | ICD-10-CM | POA: Diagnosis not present

## 2017-02-24 LAB — CBC
HCT: 31.3 % — ABNORMAL LOW (ref 39.0–52.0)
Hemoglobin: 10.9 g/dL — ABNORMAL LOW (ref 13.0–17.0)
MCH: 31.6 pg (ref 26.0–34.0)
MCHC: 34.8 g/dL (ref 30.0–36.0)
MCV: 90.7 fL (ref 78.0–100.0)
PLATELETS: 176 10*3/uL (ref 150–400)
RBC: 3.45 MIL/uL — ABNORMAL LOW (ref 4.22–5.81)
RDW: 14.3 % (ref 11.5–15.5)
WBC: 6.4 10*3/uL (ref 4.0–10.5)

## 2017-02-24 LAB — BASIC METABOLIC PANEL
Anion gap: 15 (ref 5–15)
BUN: 7 mg/dL (ref 6–20)
CALCIUM: 8.1 mg/dL — AB (ref 8.9–10.3)
CO2: 21 mmol/L — AB (ref 22–32)
CREATININE: 0.97 mg/dL (ref 0.61–1.24)
Chloride: 92 mmol/L — ABNORMAL LOW (ref 101–111)
GFR calc Af Amer: >60 mL/min (ref >60)
GFR calc non Af Amer: >60 mL/min (ref >60)
GLUCOSE: 94 mg/dL (ref 65–99)
Potassium: 3.8 mmol/L (ref 3.5–5.1)
Sodium: 128 mmol/L — ABNORMAL LOW (ref 135–145)

## 2017-02-24 LAB — URINALYSIS, ROUTINE W REFLEX MICROSCOPIC
Glucose, UA: NEGATIVE mg/dL
HGB URINE DIPSTICK: NEGATIVE
Ketones, ur: NEGATIVE mg/dL
Leukocytes, UA: NEGATIVE
Nitrite: NEGATIVE
PROTEIN: NEGATIVE mg/dL
Specific Gravity, Urine: 1.014 (ref 1.005–1.030)
pH: 5 (ref 5.0–8.0)

## 2017-02-24 NOTE — ED Triage Notes (Signed)
Pt state he started to have a productive cough today; pt states hx of MI and had chest pain ealier today; pta&ox 4 on arrival but denies pain on arrival. Pt c/o dark colored urine with hx of UTI;-Monique,RN

## 2017-02-25 NOTE — Discharge Instructions (Signed)
Eat and drink well for the next couple of days.  Return for worsening symptoms.  Passing out.
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
## 2017-03-01 DIAGNOSIS — K709 Alcoholic liver disease, unspecified: Secondary | ICD-10-CM

## 2017-03-01 HISTORY — DX: Alcoholic liver disease, unspecified: K70.9

## 2017-03-11 ENCOUNTER — Inpatient Hospital Stay (HOSPITAL_COMMUNITY)
Admission: EM | Admit: 2017-03-11 | Discharge: 2017-03-15 | DRG: 432 | Disposition: A | Discharge status: Home / Self Care | Payer: Medicaid Other | Attending: Internal Medicine | Admitting: Internal Medicine

## 2017-03-11 ENCOUNTER — Encounter (HOSPITAL_COMMUNITY): Payer: Self-pay

## 2017-03-11 ENCOUNTER — Emergency Department (HOSPITAL_COMMUNITY): Payer: Medicaid Other

## 2017-03-11 DIAGNOSIS — Z79899 Other long term (current) drug therapy: Secondary | ICD-10-CM

## 2017-03-11 DIAGNOSIS — Z23 Encounter for immunization: Secondary | ICD-10-CM

## 2017-03-11 DIAGNOSIS — D649 Anemia, unspecified: Secondary | ICD-10-CM | POA: Diagnosis present

## 2017-03-11 DIAGNOSIS — Z7982 Long term (current) use of aspirin: Secondary | ICD-10-CM

## 2017-03-11 DIAGNOSIS — I119 Hypertensive heart disease without heart failure: Secondary | ICD-10-CM | POA: Diagnosis present

## 2017-03-11 DIAGNOSIS — I43 Cardiomyopathy in diseases classified elsewhere: Secondary | ICD-10-CM

## 2017-03-11 DIAGNOSIS — G8929 Other chronic pain: Secondary | ICD-10-CM | POA: Diagnosis present

## 2017-03-11 DIAGNOSIS — K649 Unspecified hemorrhoids: Secondary | ICD-10-CM | POA: Diagnosis present

## 2017-03-11 DIAGNOSIS — K7031 Alcoholic cirrhosis of liver with ascites: Secondary | ICD-10-CM | POA: Diagnosis present

## 2017-03-11 DIAGNOSIS — R109 Unspecified abdominal pain: Secondary | ICD-10-CM

## 2017-03-11 DIAGNOSIS — M549 Dorsalgia, unspecified: Secondary | ICD-10-CM | POA: Diagnosis present

## 2017-03-11 DIAGNOSIS — K219 Gastro-esophageal reflux disease without esophagitis: Secondary | ICD-10-CM | POA: Diagnosis present

## 2017-03-11 DIAGNOSIS — E785 Hyperlipidemia, unspecified: Secondary | ICD-10-CM | POA: Diagnosis present

## 2017-03-11 DIAGNOSIS — K7011 Alcoholic hepatitis with ascites: Principal | ICD-10-CM | POA: Diagnosis present

## 2017-03-11 DIAGNOSIS — F17210 Nicotine dependence, cigarettes, uncomplicated: Secondary | ICD-10-CM | POA: Diagnosis present

## 2017-03-11 DIAGNOSIS — D509 Iron deficiency anemia, unspecified: Secondary | ICD-10-CM | POA: Diagnosis present

## 2017-03-11 DIAGNOSIS — Z590 Homelessness: Secondary | ICD-10-CM

## 2017-03-11 DIAGNOSIS — M109 Gout, unspecified: Secondary | ICD-10-CM | POA: Diagnosis present

## 2017-03-11 DIAGNOSIS — F1010 Alcohol abuse, uncomplicated: Secondary | ICD-10-CM | POA: Diagnosis present

## 2017-03-11 DIAGNOSIS — I10 Essential (primary) hypertension: Secondary | ICD-10-CM | POA: Diagnosis present

## 2017-03-11 DIAGNOSIS — R188 Other ascites: Secondary | ICD-10-CM

## 2017-03-11 DIAGNOSIS — K7200 Acute and subacute hepatic failure without coma: Secondary | ICD-10-CM | POA: Diagnosis present

## 2017-03-11 DIAGNOSIS — K921 Melena: Secondary | ICD-10-CM | POA: Diagnosis present

## 2017-03-11 DIAGNOSIS — K819 Cholecystitis, unspecified: Secondary | ICD-10-CM | POA: Diagnosis present

## 2017-03-11 DIAGNOSIS — D696 Thrombocytopenia, unspecified: Secondary | ICD-10-CM | POA: Diagnosis present

## 2017-03-11 DIAGNOSIS — K7010 Alcoholic hepatitis without ascites: Secondary | ICD-10-CM | POA: Diagnosis present

## 2017-03-11 LAB — URINALYSIS, ROUTINE W REFLEX MICROSCOPIC
GLUCOSE, UA: NEGATIVE mg/dL
HGB URINE DIPSTICK: NEGATIVE
Ketones, ur: NEGATIVE mg/dL
Leukocytes, UA: NEGATIVE
Nitrite: NEGATIVE
Protein, ur: NEGATIVE mg/dL
SPECIFIC GRAVITY, URINE: 1.011 (ref 1.005–1.030)
pH: 6 (ref 5.0–8.0)

## 2017-03-11 LAB — COMPREHENSIVE METABOLIC PANEL
ALBUMIN: 2 g/dL — AB (ref 3.5–5.0)
ALT: 104 U/L — AB (ref 17–63)
AST: 286 U/L — AB (ref 15–41)
Alkaline Phosphatase: 572 U/L — ABNORMAL HIGH (ref 38–126)
Anion gap: 9 (ref 5–15)
BILIRUBIN TOTAL: 9.4 mg/dL — AB (ref 0.3–1.2)
BUN: 5 mg/dL — AB (ref 6–20)
CHLORIDE: 99 mmol/L — AB (ref 101–111)
CO2: 22 mmol/L (ref 22–32)
CREATININE: 0.86 mg/dL (ref 0.61–1.24)
Calcium: 8.1 mg/dL — ABNORMAL LOW (ref 8.9–10.3)
GFR calc Af Amer: >60 mL/min (ref >60)
GLUCOSE: 97 mg/dL (ref 65–99)
POTASSIUM: 3.6 mmol/L (ref 3.5–5.1)
Sodium: 130 mmol/L — ABNORMAL LOW (ref 135–145)
TOTAL PROTEIN: 6.7 g/dL (ref 6.5–8.1)

## 2017-03-11 LAB — CBC WITH DIFFERENTIAL/PLATELET
Basophils Absolute: 0 10*3/uL (ref 0.0–0.1)
Basophils Relative: 0 %
Eosinophils Absolute: 0 10*3/uL (ref 0.0–0.7)
Eosinophils Relative: 0 %
HCT: 27.4 % — ABNORMAL LOW (ref 39.0–52.0)
Hemoglobin: 10 g/dL — ABNORMAL LOW (ref 13.0–17.0)
Lymphocytes Relative: 21 %
Lymphs Abs: 1.5 10*3/uL (ref 0.7–4.0)
MCH: 32.4 pg (ref 26.0–34.0)
MCHC: 36.5 g/dL — ABNORMAL HIGH (ref 30.0–36.0)
MCV: 88.7 fL (ref 78.0–100.0)
Monocytes Absolute: 0.7 10*3/uL (ref 0.1–1.0)
Monocytes Relative: 10 %
Neutro Abs: 4.8 10*3/uL (ref 1.7–7.7)
Neutrophils Relative %: 69 %
Platelets: 122 10*3/uL — ABNORMAL LOW (ref 150–400)
RBC: 3.09 MIL/uL — ABNORMAL LOW (ref 4.22–5.81)
RDW: 20.2 % — ABNORMAL HIGH (ref 11.5–15.5)
WBC: 7 10*3/uL (ref 4.0–10.5)

## 2017-03-11 LAB — LIPASE, BLOOD: Lipase: 49 U/L (ref 11–51)

## 2017-03-11 MED ORDER — MORPHINE SULFATE (PF) 4 MG/ML IV SOLN
2.0000 mg | Freq: Once | INTRAVENOUS | Status: AC
  Administered 2017-03-11: 2 mg via INTRAVENOUS
  Filled 2017-03-11: qty 1

## 2017-03-11 MED ORDER — ONDANSETRON HCL 4 MG/2ML IJ SOLN
4.0000 mg | Freq: Once | INTRAMUSCULAR | Status: AC
  Administered 2017-03-11: 4 mg via INTRAVENOUS
  Filled 2017-03-11: qty 2

## 2017-03-11 MED ORDER — SODIUM CHLORIDE 0.9 % IV BOLUS (SEPSIS)
1000.0000 mL | Freq: Once | INTRAVENOUS | Status: AC
  Administered 2017-03-11: 1000 mL via INTRAVENOUS

## 2017-03-11 MED ORDER — SODIUM CHLORIDE 0.9 % IV SOLN
Freq: Once | INTRAVENOUS | Status: AC
  Administered 2017-03-11: 22:00:00 via INTRAVENOUS
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
## 2017-03-11 NOTE — ED Notes (Signed)
Pt transported to US

## 2017-03-11 NOTE — ED Notes (Signed)
Per main lab, will add on CBC with differential on to [facility]

## 2017-03-11 NOTE — ED Triage Notes (Signed)
Patient complains of RLQ abdominal pain with dysuria and reported vomiting and diarrhea x 2 days. Patient alert and oriented, NAD

## 2017-03-11 NOTE — ED Notes (Signed)
ED Provider at bedside. 

## 2017-03-11 NOTE — ED Notes (Signed)
Pt given [place] sandwich and Sprite to drink

## 2017-03-12 ENCOUNTER — Inpatient Hospital Stay (HOSPITAL_COMMUNITY): Payer: Medicaid Other

## 2017-03-12 ENCOUNTER — Other Ambulatory Visit: Payer: Self-pay

## 2017-03-12 ENCOUNTER — Encounter: Payer: Self-pay | Admitting: Internal Medicine

## 2017-03-12 DIAGNOSIS — K7200 Acute and subacute hepatic failure without coma: Secondary | ICD-10-CM | POA: Diagnosis present

## 2017-03-12 DIAGNOSIS — K7011 Alcoholic hepatitis with ascites: Secondary | ICD-10-CM | POA: Diagnosis not present

## 2017-03-12 DIAGNOSIS — K819 Cholecystitis, unspecified: Secondary | ICD-10-CM | POA: Diagnosis present

## 2017-03-12 DIAGNOSIS — R10813 Right lower quadrant abdominal tenderness: Secondary | ICD-10-CM

## 2017-03-12 DIAGNOSIS — K7010 Alcoholic hepatitis without ascites: Secondary | ICD-10-CM | POA: Diagnosis present

## 2017-03-12 DIAGNOSIS — R112 Nausea with vomiting, unspecified: Secondary | ICD-10-CM | POA: Diagnosis present

## 2017-03-12 DIAGNOSIS — M549 Dorsalgia, unspecified: Secondary | ICD-10-CM | POA: Diagnosis present

## 2017-03-12 DIAGNOSIS — K7031 Alcoholic cirrhosis of liver with ascites: Secondary | ICD-10-CM | POA: Diagnosis present

## 2017-03-12 DIAGNOSIS — Z79899 Other long term (current) drug therapy: Secondary | ICD-10-CM | POA: Diagnosis not present

## 2017-03-12 DIAGNOSIS — Z590 Homelessness: Secondary | ICD-10-CM | POA: Diagnosis not present

## 2017-03-12 DIAGNOSIS — Z23 Encounter for immunization: Secondary | ICD-10-CM | POA: Diagnosis not present

## 2017-03-12 DIAGNOSIS — G8929 Other chronic pain: Secondary | ICD-10-CM | POA: Diagnosis present

## 2017-03-12 DIAGNOSIS — F17210 Nicotine dependence, cigarettes, uncomplicated: Secondary | ICD-10-CM | POA: Diagnosis present

## 2017-03-12 DIAGNOSIS — E785 Hyperlipidemia, unspecified: Secondary | ICD-10-CM | POA: Diagnosis present

## 2017-03-12 DIAGNOSIS — D509 Iron deficiency anemia, unspecified: Secondary | ICD-10-CM | POA: Diagnosis present

## 2017-03-12 DIAGNOSIS — K649 Unspecified hemorrhoids: Secondary | ICD-10-CM | POA: Diagnosis present

## 2017-03-12 DIAGNOSIS — F329 Major depressive disorder, single episode, unspecified: Secondary | ICD-10-CM

## 2017-03-12 DIAGNOSIS — D649 Anemia, unspecified: Secondary | ICD-10-CM | POA: Diagnosis present

## 2017-03-12 DIAGNOSIS — Z7982 Long term (current) use of aspirin: Secondary | ICD-10-CM | POA: Diagnosis not present

## 2017-03-12 DIAGNOSIS — D696 Thrombocytopenia, unspecified: Secondary | ICD-10-CM | POA: Diagnosis present

## 2017-03-12 DIAGNOSIS — K921 Melena: Secondary | ICD-10-CM | POA: Diagnosis present

## 2017-03-12 DIAGNOSIS — F1010 Alcohol abuse, uncomplicated: Secondary | ICD-10-CM | POA: Diagnosis present

## 2017-03-12 DIAGNOSIS — R10811 Right upper quadrant abdominal tenderness: Secondary | ICD-10-CM

## 2017-03-12 DIAGNOSIS — R140 Abdominal distension (gaseous): Secondary | ICD-10-CM

## 2017-03-12 DIAGNOSIS — I10 Essential (primary) hypertension: Secondary | ICD-10-CM | POA: Diagnosis present

## 2017-03-12 DIAGNOSIS — M109 Gout, unspecified: Secondary | ICD-10-CM | POA: Diagnosis present

## 2017-03-12 DIAGNOSIS — K219 Gastro-esophageal reflux disease without esophagitis: Secondary | ICD-10-CM | POA: Diagnosis present

## 2017-03-12 LAB — SAVE SMEAR

## 2017-03-12 LAB — COMPREHENSIVE METABOLIC PANEL
ALBUMIN: 1.6 g/dL — AB (ref 3.5–5.0)
ALK PHOS: 483 U/L — AB (ref 38–126)
ALT: 82 U/L — AB (ref 17–63)
AST: 236 U/L — AB (ref 15–41)
Anion gap: 6 (ref 5–15)
BILIRUBIN TOTAL: 8 mg/dL — AB (ref 0.3–1.2)
BUN: 5 mg/dL — AB (ref 6–20)
CALCIUM: 7.2 mg/dL — AB (ref 8.9–10.3)
CO2: 21 mmol/L — ABNORMAL LOW (ref 22–32)
Chloride: 103 mmol/L (ref 101–111)
Creatinine, Ser: 0.97 mg/dL (ref 0.61–1.24)
GFR calc Af Amer: >60 mL/min (ref >60)
GFR calc non Af Amer: >60 mL/min (ref >60)
GLUCOSE: 153 mg/dL — AB (ref 65–99)
POTASSIUM: 3.9 mmol/L (ref 3.5–5.1)
Sodium: 130 mmol/L — ABNORMAL LOW (ref 135–145)
TOTAL PROTEIN: 5.4 g/dL — AB (ref 6.5–8.1)

## 2017-03-12 LAB — CBC
HEMATOCRIT: 22.5 % — AB (ref 39.0–52.0)
HEMATOCRIT: 22.8 % — AB (ref 39.0–52.0)
HEMOGLOBIN: 8.1 g/dL — AB (ref 13.0–17.0)
Hemoglobin: 8.2 g/dL — ABNORMAL LOW (ref 13.0–17.0)
MCH: 31.4 pg (ref 26.0–34.0)
MCH: 31.5 pg (ref 26.0–34.0)
MCHC: 36 g/dL (ref 30.0–36.0)
MCHC: 36 g/dL (ref 30.0–36.0)
MCV: 87.2 fL (ref 78.0–100.0)
MCV: 87.7 fL (ref 78.0–100.0)
Platelets: 124 10*3/uL — ABNORMAL LOW (ref 150–400)
Platelets: 127 10*3/uL — ABNORMAL LOW (ref 150–400)
RBC: 2.58 MIL/uL — AB (ref 4.22–5.81)
RBC: 2.6 MIL/uL — ABNORMAL LOW (ref 4.22–5.81)
RDW: 20.5 % — AB (ref 11.5–15.5)
RDW: 20.6 % — ABNORMAL HIGH (ref 11.5–15.5)
WBC: 5.2 10*3/uL (ref 4.0–10.5)
WBC: 5.3 10*3/uL (ref 4.0–10.5)

## 2017-03-12 LAB — MRSA PCR SCREENING: MRSA BY PCR: NEGATIVE

## 2017-03-12 LAB — BILIRUBIN, FRACTIONATED(TOT/DIR/INDIR)
BILIRUBIN INDIRECT: 3.3 mg/dL — AB (ref 0.3–0.9)
BILIRUBIN TOTAL: 8 mg/dL — AB (ref 0.3–1.2)
Bilirubin, Direct: 4.7 mg/dL — ABNORMAL HIGH (ref 0.1–0.5)

## 2017-03-12 LAB — PHOSPHORUS: Phosphorus: 3.9 mg/dL (ref 2.5–4.6)

## 2017-03-12 LAB — OCCULT BLOOD X 1 CARD TO LAB, STOOL: Fecal Occult Bld: NEGATIVE

## 2017-03-12 LAB — PROTIME-INR
INR: 2.11
Prothrombin Time: 23.4 seconds — ABNORMAL HIGH (ref 11.4–15.2)

## 2017-03-12 LAB — LACTATE DEHYDROGENASE: LDH: 329 U/L — AB (ref 98–192)

## 2017-03-12 LAB — RETICULOCYTES
RBC.: 2.77 MIL/uL — ABNORMAL LOW (ref 4.22–5.81)
RETIC COUNT ABSOLUTE: 55.4 10*3/uL (ref 19.0–186.0)
RETIC CT PCT: 2 % (ref 0.4–3.1)

## 2017-03-12 LAB — MAGNESIUM: Magnesium: 1.4 mg/dL — ABNORMAL LOW (ref 1.7–2.4)

## 2017-03-12 MED ORDER — ENOXAPARIN SODIUM 40 MG/0.4ML ~~LOC~~ SOLN
40.0000 mg | Freq: Every day | SUBCUTANEOUS | Status: DC
  Administered 2017-03-12 – 2017-03-15 (×4): 40 mg via SUBCUTANEOUS
  Filled 2017-03-12 (×4): qty 0.4

## 2017-03-12 MED ORDER — ATORVASTATIN CALCIUM 40 MG PO TABS: 40.0000 mg | ORAL_TABLET | Freq: Every day | ORAL | Status: DC

## 2017-03-12 MED ORDER — ONDANSETRON HCL 4 MG/2ML IJ SOLN: 4.0000 mg | Freq: Four times a day (QID) | INTRAMUSCULAR | Status: DC | PRN

## 2017-03-12 MED ORDER — PANTOPRAZOLE SODIUM 40 MG PO TBEC
40.0000 mg | DELAYED_RELEASE_TABLET | Freq: Every day | ORAL | Status: DC
  Administered 2017-03-12 – 2017-03-15 (×4): 40 mg via ORAL
  Filled 2017-03-12 (×4): qty 1

## 2017-03-12 MED ORDER — KETOROLAC TROMETHAMINE 15 MG/ML IJ SOLN
15.0000 mg | Freq: Four times a day (QID) | INTRAMUSCULAR | Status: DC | PRN
  Administered 2017-03-12 – 2017-03-15 (×8): 15 mg via INTRAVENOUS
  Filled 2017-03-12 (×9): qty 1

## 2017-03-12 MED ORDER — MAGNESIUM SULFATE 2 GM/50ML IV SOLN
2.0000 g | Freq: Once | INTRAVENOUS | Status: AC
  Administered 2017-03-12: 2 g via INTRAVENOUS
  Filled 2017-03-12: qty 50

## 2017-03-12 MED ORDER — TECHNETIUM TC 99M MEBROFENIN IV KIT
5.0600 | PACK | Freq: Once | INTRAVENOUS | Status: AC | PRN
  Administered 2017-03-12: 5.06 via INTRAVENOUS

## 2017-03-12 MED ORDER — SODIUM CHLORIDE 0.9 % IV SOLN
INTRAVENOUS | Status: DC
  Administered 2017-03-12 – 2017-03-13 (×4): via INTRAVENOUS

## 2017-03-12 MED ORDER — ENSURE ENLIVE PO LIQD
237.0000 mL | Freq: Two times a day (BID) | ORAL | Status: DC
  Administered 2017-03-12 – 2017-03-15 (×3): 237 mL via ORAL

## 2017-03-12 MED ORDER — ONDANSETRON HCL 4 MG PO TABS: 4.0000 mg | ORAL_TABLET | Freq: Four times a day (QID) | ORAL | Status: DC | PRN

## 2017-03-12 MED ORDER — INFLUENZA VAC SPLIT QUAD 0.5 ML IM SUSY
0.5000 mL | PREFILLED_SYRINGE | INTRAMUSCULAR | Status: AC
  Administered 2017-03-13: 0.5 mL via INTRAMUSCULAR
  Filled 2017-03-12: qty 0.5

## 2017-03-12 MED ORDER — SERTRALINE HCL 50 MG PO TABS
50.0000 mg | ORAL_TABLET | Freq: Every day | ORAL | Status: DC
  Administered 2017-03-12 – 2017-03-15 (×4): 50 mg via ORAL
  Filled 2017-03-12 (×4): qty 1

## 2017-03-12 MED ORDER — ADULT MULTIVITAMIN W/MINERALS CH
1.0000 | ORAL_TABLET | Freq: Every day | ORAL | Status: DC
  Administered 2017-03-12 – 2017-03-15 (×4): 1 via ORAL
  Filled 2017-03-12 (×4): qty 1

## 2017-03-12 MED ORDER — SUCRALFATE 1 G PO TABS
1.0000 g | ORAL_TABLET | Freq: Three times a day (TID) | ORAL | Status: DC
  Administered 2017-03-12 – 2017-03-15 (×13): 1 g via ORAL
  Filled 2017-03-12 (×14): qty 1

## 2017-03-12 MED ORDER — THIAMINE HCL 100 MG/ML IJ SOLN
100.0000 mg | Freq: Every day | INTRAMUSCULAR | Status: DC
  Administered 2017-03-12 – 2017-03-15 (×4): 100 mg via INTRAVENOUS
  Filled 2017-03-12 (×4): qty 2

## 2017-03-12 MED ORDER — METOPROLOL SUCCINATE ER 100 MG PO TB24
100.0000 mg | ORAL_TABLET | Freq: Every day | ORAL | Status: DC
  Administered 2017-03-12 – 2017-03-15 (×4): 100 mg via ORAL
  Filled 2017-03-12 (×4): qty 1

## 2017-03-12 NOTE — Progress Notes (Signed)
Pt arrived floor in the company of ED tech. Pt assessed to be AxOx4. Vital signs completed. Skin assessed, cardiac telemetry initiated. Pt oriented to room. Call bell within reach. Will continue to monitor.

## 2017-03-12 NOTE — Progress Notes (Signed)
   Subjective: Doing well today. States that he was having 3-4 days of N/V and diarrhea. Denies daily alcohol use stating he quit hard liquor 2-3 months ago and now only drinks 2-3 12oz beers every other day. He is hungry and is no longer having nausea. He would like a diet. We discussed that we need to do a few more tests but will initiate a diet. He agrees.   Objective: Vital signs in last 24 hours: Vitals:   03/12/17 0045 03/12/17 0100 03/12/17 0130 03/12/17 0212  BP: 121/85 110/71 92/60 116/74  Pulse: 90 87 88 93  Resp: 18 14 19 18   Temp:    98.4 F (36.9 C)  TempSrc:    Oral  SpO2: 98% 98% 97% 97%  Weight:    151 lb 1.6 oz (68.5 kg)  Height:    5\' 10"  (1.778 m)   General: Thin male in no acute distress  Pulm: Good air movement with no wheezing or crackles  CV: RRR, no murmurs, no rubs  Abdomen: Distended and tender to palpation, no fluid wave appreciated. Does appear to have hepatomegaly but no splenomegaly appreciated. No caput medusae. No gynecomastia. No signs of other liver stigmata  Extremities: No LE edema, no palmar erythema   Assessment/Plan:  1. Hyperbilirubinemia / Transaminitis  - Elevated total bilirubin with equally elevated direct and indirect pointing to intrahepatic pathology  - Elevated AST 286, ALT 104, and ALP 572  - LDH elevated to 329 - RUQ ultrasound showing a small amount of ascites and nonspecific mild gallbladder wall thickening. HIDA scan showing delayed hepatic uptake and excretion compatible with underlying chronic hepatic disease or hepatitis. Normal EF.  - Prior CT abdomen on 11/19/16, 05/10/16, and 04/30/2008 illustrating no nodularity or focal liver abnormalities  - Will evaluate smear today  - Advance diet as tolerated and Zofran for nausea  - Given his labs the most likely diagnosis is acutely decompensated alcoholic liver disease. Currently no concern for SBP.  - On the differential is tick born illness (no fevers but can cause thrombocytopenia,  anemia, and liver dysfunction), infectious or autoimmune hepatitis (although AST and ALT not elevated to the extent you would expect), or a hemolytic anemia (elevated LDH, checking haptoglobin)  2. Hypertension  - Continue home metoprolol    Dispo: Anticipated discharge in approximately >1 day(s).   Ina Homes, MD 03/12/2017, 6:19 AM My Pager: [phone]

## 2017-03-12 NOTE — Plan of Care (Signed)
  Progressing Activity: Risk for activity intolerance will decrease 03/12/2017 1225 - Progressing by Marice Potter, RN Coping: Level of anxiety will decrease 03/12/2017 1225 - Progressing by Marice Potter, RN

## 2017-03-12 NOTE — Progress Notes (Signed)
Initial Nutrition Assessment  DOCUMENTATION CODES:   Not applicable  INTERVENTION:  Continue Ensure Enlive po BID, each supplement provides 350 kcal and 20 grams of protein with diet advancement Recommend folic Acid 1 mg daily MVI w/ Minerals   NUTRITION DIAGNOSIS:   Inadequate oral intake related to vomiting, nausea, poor appetite as evidenced by per patient/family report.  GOAL:   Patient will meet greater than or equal to 90% of their needs  MONITOR:   PO intake, Supplement acceptance, I & O's, Labs, Weight trends  REASON FOR ASSESSMENT:   Malnutrition Screening Tool    ASSESSMENT:   Devin [name] is a 56 yo male with PMH of HTN, GERD, Depression, Chronic ETOH use who presents with nausea, vomiting, and diarrhea x1 week. Worsening cirrhosis. Has tolerated clear liquids since admission.   Spoke with Devin [name] at bedside. He reports UBW of 155-160 pounds, says he has lost a lot of weight recently. He spoke about "walking the streets constantly, and being depressed leading to his poor PO intake. He also states he does not eat when he drinks. He had to go to the bathroom during my visit, has been struggling with diarrhea, on enteric precautions and thus I was unable to complete assessment or nutrition focused physical assessment. Has been drinking ensure. Will follow-up.  Labs reviewed:  Na 130, Mg 1.4, TBili 4.7  Medications reviewed and include:  Thiamine injection NS at 168mL/hr   NUTRITION - FOCUSED PHYSICAL EXAM:  Unable to complete  Diet Order:  Diet Heart Room service appropriate? Yes; Fluid consistency: Thin  EDUCATION NEEDS:   Not appropriate for education at this time  Skin:  Skin Assessment: Reviewed RN Assessment  Last BM:  03/11/2017  Height:   Ht Readings from Last 1 Encounters:  03/12/17 5\' 10"  (1.778 m)    Weight:   Wt Readings from Last 1 Encounters:  03/12/17 151 lb 1.6 oz (68.5 kg)    Ideal Body Weight:  75.45 kg  BMI:  Body mass  index is 21.68 kg/m.  Estimated Nutritional Needs:   Kcal:  2979-8921 calories  Protein:  89-103 grams (1.3-1.5g/kg)  Fluid:  1.7-2L   Devin Anis. D'Arcy Abraha, MS, RD LDN Inpatient Clinical Dietitian Pager [phone]

## 2017-03-12 NOTE — ED Notes (Signed)
Admitting MD at bedside.

## 2017-03-13 DIAGNOSIS — K7010 Alcoholic hepatitis without ascites: Secondary | ICD-10-CM

## 2017-03-13 DIAGNOSIS — Z7289 Other problems related to lifestyle: Secondary | ICD-10-CM

## 2017-03-13 DIAGNOSIS — Z79899 Other long term (current) drug therapy: Secondary | ICD-10-CM

## 2017-03-13 LAB — COMPREHENSIVE METABOLIC PANEL
ALT: 71 U/L — ABNORMAL HIGH (ref 17–63)
ANION GAP: 4 — AB (ref 5–15)
AST: 188 U/L — AB (ref 15–41)
Albumin: 1.5 g/dL — ABNORMAL LOW (ref 3.5–5.0)
Alkaline Phosphatase: 467 U/L — ABNORMAL HIGH (ref 38–126)
BUN: 6 mg/dL (ref 6–20)
CO2: 21 mmol/L — ABNORMAL LOW (ref 22–32)
Calcium: 7.5 mg/dL — ABNORMAL LOW (ref 8.9–10.3)
Chloride: 109 mmol/L (ref 101–111)
Creatinine, Ser: 0.89 mg/dL (ref 0.61–1.24)
GFR calc Af Amer: >60 mL/min (ref >60)
GFR calc non Af Amer: >60 mL/min (ref >60)
Glucose, Bld: 107 mg/dL — ABNORMAL HIGH (ref 65–99)
POTASSIUM: 3.9 mmol/L (ref 3.5–5.1)
SODIUM: 134 mmol/L — AB (ref 135–145)
TOTAL PROTEIN: 5.5 g/dL — AB (ref 6.5–8.1)
Total Bilirubin: 8.2 mg/dL — ABNORMAL HIGH (ref 0.3–1.2)

## 2017-03-13 LAB — IRON AND TIBC
IRON: 126 ug/dL (ref 45–182)
Saturation Ratios: 101 % — ABNORMAL HIGH (ref 17.9–39.5)
TIBC: 125 ug/dL — AB (ref 250–450)

## 2017-03-13 LAB — HEPATITIS B SURFACE ANTIGEN: Hepatitis B Surface Ag: NEGATIVE

## 2017-03-13 LAB — HEPATITIS C ANTIBODY (REFLEX)

## 2017-03-13 LAB — HEPATITIS A ANTIBODY, TOTAL: HEP A TOTAL AB: NEGATIVE

## 2017-03-13 LAB — CBC
HEMATOCRIT: 24.9 % — AB (ref 39.0–52.0)
HEMOGLOBIN: 8.7 g/dL — AB (ref 13.0–17.0)
MCH: 31.3 pg (ref 26.0–34.0)
MCHC: 34.9 g/dL (ref 30.0–36.0)
MCV: 89.6 fL (ref 78.0–100.0)
Platelets: 105 10*3/uL — ABNORMAL LOW (ref 150–400)
RBC: 2.78 MIL/uL — ABNORMAL LOW (ref 4.22–5.81)
RDW: 21.7 % — AB (ref 11.5–15.5)
WBC: 5.4 10*3/uL (ref 4.0–10.5)

## 2017-03-13 LAB — HEPATITIS B CORE ANTIBODY, TOTAL: Hep B Core Total Ab: NEGATIVE

## 2017-03-13 LAB — HCV COMMENT:

## 2017-03-13 LAB — HAPTOGLOBIN: Haptoglobin: <10 mg/dL — ABNORMAL LOW (ref 34–200)

## 2017-03-13 LAB — DIRECT ANTIGLOBULIN TEST (NOT AT ARMC)
DAT, IgG: NEGATIVE
DAT, complement: NEGATIVE

## 2017-03-13 LAB — PROTIME-INR
INR: 1.57
Prothrombin Time: 18.6 seconds — ABNORMAL HIGH (ref 11.4–15.2)

## 2017-03-13 LAB — HEPATITIS B CORE ANTIBODY, IGM: HEP B C IGM: NEGATIVE

## 2017-03-13 LAB — HEPATITIS A ANTIBODY, IGM: Hep A IgM: NEGATIVE

## 2017-03-13 LAB — FERRITIN: FERRITIN: 1456 ng/mL — AB (ref 24–336)

## 2017-03-13 NOTE — Progress Notes (Signed)
Subjective: Patient reports no longer having nausea or vomiting. Large solid bowel movement this morning with blood. Patient reported having intermittent episodes of bright red bloody stool thought to be from his hemorrhoids. Denies and dark or black stools, or bloody emesis. Only concern this morning was continued right sided abdominal pain. Explained the need to consult GI for further evaluation. All questions and concerns were addressed.  Objective: Vital signs in last 24 hours: Vitals:   03/12/17 1250 03/12/17 2217 03/13/17 0542 03/13/17 0954  BP: 130/79 124/80 118/85 137/69  Pulse: 68 81 77 94  Resp:  18 18   Temp: 98.6 F (37 C) 98.6 F (37 C) 98.2 F (36.8 C)   TempSrc: Oral Oral Oral   SpO2: 97% 98% 97%   Weight:      Height:       Weight change:   Intake/Output Summary (Last 24 hours) at 03/13/2017 1107 Last data filed at 03/13/2017 0654 Gross per 24 hour  Intake 2758.33 ml  Output 1450 ml  Net 1308.33 ml   Physical Exam General: thin appearing man lying comfortably in bed, no acute distress HENT Eyes: Scleral icterus Heart: RRR, no murmurs,  Lungs: CTAB, normal work of breathing Abd: soft, tenderness to light palpation, normal bowel sounds, pain with palpation R>L Neuro: alert and oriented times x4 Extremities: no LE edema, skin warm and dry  Lab Results: @LABTEST2 @ Micro Results: Recent Results (from the past 240 hour(s))  MRSA PCR Screening     Status: None   Collection Time: 03/12/17  6:09 AM  Result Value Ref Range Status   MRSA by PCR NEGATIVE NEGATIVE Final    Comment:        The GeneXpert MRSA Assay (FDA approved for NASAL specimens only), is one component of a comprehensive MRSA colonization surveillance program. It is not intended to diagnose MRSA infection nor to guide or monitor treatment for MRSA infections.    Studies/Results: Nm Hepato W/eject Fract  Result Date: 03/12/2017 CLINICAL DATA:  Nausea, vomiting, right upper quadrant  pain EXAM: NUCLEAR MEDICINE HEPATOBILIARY IMAGING WITH GALLBLADDER EF TECHNIQUE: Sequential images of the abdomen were obtained out to 60 minutes following intravenous administration of radiopharmaceutical. After oral ingestion of Ensure, gallbladder ejection fraction was determined. At 60 min, normal ejection fraction is greater than 33%. RADIOPHARMACEUTICALS:  5.06 mCi Tc-32m  Choletec IV COMPARISON:  03/11/2017 FINDINGS: There is delayed hepatic uptake with eventual biliary excretion compatible with underlying chronic hepatic disease or hepatitis. Gallbladder activity is visualized, consistent with patency of cystic duct. Biliary activity passes into small bowel, consistent with patent common bile duct. Calculated gallbladder ejection fraction is 100%. (Normal gallbladder ejection fraction with Ensure is greater than 33%.) IMPRESSION: Delayed hepatic uptake and biliary excretion compatible with underlying chronic hepatic disease or hepatitis. Gallbladder is visualized as well as activity progressing into the small bowel compatible with patency of the cystic duct and common bile duct Calculated ejection fraction following ingestion of ensure estimated at 100%. Electronically Signed   By: Jerilynn Mages.  Shick M.D.   On: 03/12/2017 11:48   US Abdomen Limited  Result Date: 03/11/2017 CLINICAL DATA:  Right upper quadrant pain 2 days with nausea and vomiting. Elevated LFTs. EXAM: ULTRASOUND ABDOMEN LIMITED RIGHT UPPER QUADRANT COMPARISON:  CT 11/19/2016 an ultrasound 11/19/2016 FINDINGS: Gallbladder: No evidence of gallstones or sludge. Mild gallbladder wall thickening measuring 4.5 mm. Small amount of pericholecystic fluid. Common bile duct: Diameter: 4.4 mm. Liver: No focal lesion identified. Within normal limits in parenchymal echogenicity.  Portal vein is patent on color Doppler imaging with normal direction of blood flow towards the liver. Small amount of perihepatic fluid. IMPRESSION: Small amount of ascites with  nonspecific mild gallbladder wall thickening measuring 4.5 mm. No gallstones or sludge. Electronically Signed   By: Marin Olp M.D.   On: 03/11/2017 21:51   Medications: I have reviewed the patient's current medications. Scheduled Meds: . enoxaparin (LOVENOX) injection  40 mg Subcutaneous Daily  . feeding supplement (ENSURE ENLIVE)  237 mL Oral BID BM  . metoprolol succinate  100 mg Oral Daily  . multivitamin with minerals  1 tablet Oral Daily  . pantoprazole  40 mg Oral Daily  . sertraline  50 mg Oral Daily  . sucralfate  1 g Oral TID WC & HS  . thiamine  100 mg Intravenous Daily   Continuous Infusions: PRN Meds:.ketorolac, ondansetron **OR** ondansetron (ZOFRAN) IV Assessment/Plan: Active Problems:   Hyperbilirubinemia  56 year old man with a PMHx significant for GERD, HTN, homelessness, and alcohol abuse who presented with a 1 week history of nausea, vomiting, diarrhea, and right sided abdominal pain was found to have scleral icterus on exam and labs consistent with alcoholic hepatitis and microcytic anemia.  Alcoholic Hepatitis LFT's down trending towards patients baseline. Elevated bilirubin, low albumin, and low platelets indicate chronic liver disease.  Pt counseled on the importance of ceasing alcohol use. Discriminant function score has decreased from 45.7 down to 23.8 which is below the treatment threshold. Will not give steroids at this time.  Microcytic Anemia Hemoglobin now 8.7 down from patients baseline of 12. Pt endorses intermittent bright red blood with stool that he attributes to his hemorrhoids. Elevated LDH and low haptoglobin are indicative of hemolysis. Combs test was negative. 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 Will order hemoglobin electrophoresis for possible thalassemia or hemoglobinopathy. -Hemoglobin electrophoresis -GI Consult  Homelessness -Social work consulted  Hypertension -Home  Metoprolol  This is a Careers information officer Note.  The care of the patient was discussed with Dr. Tarri Abernethy and the assessment and plan formulated with their assistance.  Please see their attached note for official documentation of the daily encounter.   LOS: 1 day   Ronnette Juniper, Medical Student 03/13/2017, 11:07 AM My pager: [phone]

## 2017-03-13 NOTE — Progress Notes (Signed)
   Subjective: Doing well this AM. States that the nausea and vomiting have improved. He had 1 solid bowel movement this am that did have some blood on the outside of the stool. He states that he has hemorrhoids. Discussed that we have consulted GI to come evaluate him. He expresses that he will stop drinking. He will need outpatient follow-up.   Objective: Vital signs in last 24 hours: Vitals:   03/12/17 0212 03/12/17 1128 03/12/17 1250 03/12/17 2217  BP: 116/74 133/83 130/79 124/80  Pulse: 93 68 68 81  Resp: 18   18  Temp: 98.4 F (36.9 C)  98.6 F (37 C) 98.6 F (37 C)  TempSrc: Oral  Oral Oral  SpO2: 97%  97% 98%  Weight: 151 lb 1.6 oz (68.5 kg)     Height: 5\' 10"  (1.778 m)      General: Thin male in no acute distress Pulm: Good air movement with no wheezing or crackles  CV: RRR, no murmurs, no murmurs Abdomen: Active bowel sounds, tenderness to palpation of the abdomin worse in the RUQ Extremities: No LE edema, warm and dry   Assessment/Plan:  1. Acutely Decompensated Alcoholic Liver Disease - Hepatic function tests trending back towards baseline  - Encouraged alcohol cessation  - Advance diet as tolerated and Zofran for nausea  - GI consulted, recommending diagnostic paracentesis to rule out SBP  - Needs to follow-up with GI as outpatient   2. Normocytic Anemia  - Baseline Hgb near 12, down to 8.2  - Indirect bilirubin high, LDH high, and haptoglobin low, which may indicate hemolysis however these labs will be skewed in the setting of liver dysfunction  - Smear showing target cells and spherocytes with no schistocytes, this fits with liver dysfunction however could also be explained with hemoglobinopathies  - Coombs test negative  - Continue to monitor   3. Hypertension  - Continue home metoprolol    Dispo: Anticipated discharge in approximately >1 day(s).   Ina Homes, MD 03/13/2017, 5:31 AM My Pager: [phone]

## 2017-03-13 NOTE — Plan of Care (Signed)
  Progressing Education: Knowledge of General Education information will improve 03/13/2017 0319 - Progressing by Verne Grain, RN
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
## 2017-03-14 ENCOUNTER — Inpatient Hospital Stay (HOSPITAL_COMMUNITY): Payer: Medicaid Other

## 2017-03-14 DIAGNOSIS — K649 Unspecified hemorrhoids: Secondary | ICD-10-CM

## 2017-03-14 LAB — COMPREHENSIVE METABOLIC PANEL
ALT: 60 U/L (ref 17–63)
ANION GAP: 6 (ref 5–15)
AST: 146 U/L — ABNORMAL HIGH (ref 15–41)
Albumin: 1.5 g/dL — ABNORMAL LOW (ref 3.5–5.0)
Alkaline Phosphatase: 478 U/L — ABNORMAL HIGH (ref 38–126)
BUN: 6 mg/dL (ref 6–20)
CO2: 23 mmol/L (ref 22–32)
Calcium: 8 mg/dL — ABNORMAL LOW (ref 8.9–10.3)
Chloride: 106 mmol/L (ref 101–111)
Creatinine, Ser: 0.92 mg/dL (ref 0.61–1.24)
GFR calc Af Amer: >60 mL/min (ref >60)
Glucose, Bld: 103 mg/dL — ABNORMAL HIGH (ref 65–99)
Potassium: 3.7 mmol/L (ref 3.5–5.1)
SODIUM: 135 mmol/L (ref 135–145)
TOTAL PROTEIN: 5.9 g/dL — AB (ref 6.5–8.1)
Total Bilirubin: 7.7 mg/dL — ABNORMAL HIGH (ref 0.3–1.2)

## 2017-03-14 LAB — CBC
HCT: 26.1 % — ABNORMAL LOW (ref 39.0–52.0)
HEMOGLOBIN: 9 g/dL — AB (ref 13.0–17.0)
MCH: 30.7 pg (ref 26.0–34.0)
MCHC: 34.5 g/dL (ref 30.0–36.0)
MCV: 89.1 fL (ref 78.0–100.0)
Platelets: 108 10*3/uL — ABNORMAL LOW (ref 150–400)
RBC: 2.93 MIL/uL — ABNORMAL LOW (ref 4.22–5.81)
RDW: 22.5 % — ABNORMAL HIGH (ref 11.5–15.5)
WBC: 5.5 10*3/uL (ref 4.0–10.5)

## 2017-03-14 LAB — HEMOGLOBINOPATHY EVALUATION
HGB A: 98 % (ref 96.4–98.8)
HGB C: 0 %
Hgb A2 Quant: 2 % (ref 1.8–3.2)
Hgb F Quant: 0 % (ref 0.0–2.0)
Hgb S Quant: 0 %
Hgb Variant: 0 %

## 2017-03-14 LAB — HIV ANTIBODY (ROUTINE TESTING W REFLEX): HIV SCREEN 4TH GENERATION: NONREACTIVE

## 2017-03-14 MED ORDER — IOPAMIDOL (ISOVUE-300) INJECTION 61%
INTRAVENOUS | Status: AC
  Filled 2017-03-14: qty 30

## 2017-03-14 MED ORDER — IOPAMIDOL (ISOVUE-300) INJECTION 61%
INTRAVENOUS | Status: AC
  Administered 2017-03-14: 100 mL
  Filled 2017-03-14: qty 100

## 2017-03-14 NOTE — Progress Notes (Signed)
   Subjective: Doing well this AM. He is tolerating PO intake and ambulating well. He continues to have abdominal discomfort primarily on the right side. He went for a diagnostic paracentesis this AM but unfortunately did not have enough fluid so the procedure was not performed. GI has seen the patient and ordered an abdominal CT with contrast for his abdominal pain. He had a bowel movement this AM with blood on the tissue paper from his hemorrhoids. All questions and concerns addressed.   Objective: Vital signs in last 24 hours: Vitals:   03/13/17 0542 03/13/17 0954 03/13/17 1407 03/13/17 2129  BP: 118/85 137/69 115/79 129/84  Pulse: 77 94 79 81  Resp: 18  18 17   Temp: 98.2 F (36.8 C)  98.8 F (37.1 C) 99.4 F (37.4 C)  TempSrc: Oral  Oral Oral  SpO2: 97%  99% 98%  Weight:      Height:       General: Thin male in no acute distress Pulm: Good air movement with no wheezing or crackles  CV: RRR, no murmurs, no rubs Abdomen: Distended and significantly tender in the RUQ and RLQ, no fluid wave  Extremities: No LE edema  Assessment/Plan:  1. Acutely Decompensated Alcoholic Liver Disease - Hepatic function tests trending back towards baseline  - Encouraged alcohol cessation  - Advance diet as tolerated and Zofran for nausea  - Unable to due paracentesis this AM - GI consulted, ordered abdominal CT with contrast  - HFE gene ordered  - Needs to follow-up with GI as outpatient   2. Normocytic Anemia  - Baseline Hgb near 12, down to 8.2  - Checking Hgb electrophoresis  - Iron studies yesterday illustrating ferritin >1400 and iron saturation >100%. It is difficult to say whether this is hemochromatosis or do to his liver failure. We will check HFE genes - Continue to monitor   3. Hypertension  - Continue home metoprolol  Dispo: Anticipated discharge in approximately 0-1 day(s).   Ina Homes, MD 03/14/2017, 5:25 AM My Pager: [phone]

## 2017-03-14 NOTE — Progress Notes (Addendum)
Patient presented to radiology department for possible diagnostic paracentesis.  Limited Abd US performed; results dictated separately. Scan shows small amount of perihepatic fluid which is not amenable to paracentesis.   Message relayed to Dr. Alessandra Bevels by [place] staff.   Brynda Greathouse, MS RD PA-C 10:16 AM

## 2017-03-14 NOTE — Progress Notes (Signed)
[facility] Gastroenterology Progress Note  Devin [name] 56 y.o. [birth year]  CC:  Drop in hemoglobin, abnormal LFTs   Subjective: Patient continues to complain of generalized abdominal discomfort. Denied any nausea or vomiting. Small amount of blood on the tissue paper.  ROS : Negative for chest pain. Positive for weakness and fatigue.   Objective: Vital signs in last 24 hours: Vitals:   03/14/17 0607 03/14/17 1024  BP: 121/76 118/64  Pulse: 73 83  Resp: 18   Temp: 98.6 F (37 C)   SpO2: 99%     Physical Exam:  General:  Alert, cooperative, no distress, appears stated age     Eyes:  Scleral icterus noted   Lungs:   Clear to auscultation bilaterally, respirations unlabored  Heart:  Regular rate and rhythm, S1, S2 normal  Abdomen:   Soft, generalized tenderness to palpation without any rebound and guarding, bowel sounds present no masses,   Extremities: Extremities normal, atraumatic, no  edema       Lab Results: Recent Labs    03/12/17 0534 03/13/17 0504 03/14/17 0703  NA  --  134* 135  K  --  3.9 3.7  CL  --  109 106  CO2  --  21* 23  GLUCOSE  --  107* 103*  BUN  --  6 6  CREATININE  --  0.89 0.92  CALCIUM  --  7.5* 8.0*  MG 1.4*  --   --   PHOS 3.9  --   --    Recent Labs    03/13/17 0504 03/14/17 0703  AST 188* 146*  ALT 71* 60  ALKPHOS 467* 478*  BILITOT 8.2* 7.7*  PROT 5.5* 5.9*  ALBUMIN 1.5* 1.5*   Recent Labs    03/11/17 1811  03/13/17 0504 03/14/17 0703  WBC 7.0   < > 5.4 5.5  NEUTROABS 4.8  --   --   --   HGB 10.0*   < > 8.7* 9.0*  HCT 27.4*   < > 24.9* 26.1*  MCV 88.7   < > 89.6 89.1  PLT 122*   < > 105* 108*   < > = values in this interval not displayed.   Recent Labs    03/12/17 0125 03/13/17 0504  LABPROT 23.4* 18.6*  INR 2.11 1.57      Assessment/Plan: - nausea vomiting and diarrhea. Resolved now - Abnormal LFTs. Most likely from alcoholic hepatitis.discriminant function score of 23.8 on admission - Rectal bleeding.  Intermittent for many months. Patient attributes that to hemorrhoids. - Homelessness - history of LA grade  C esophagitis based on EGD in January 2018 - alcohol use - thrombocytopenia  Recommendations ----------------------- - Patient's LFTs and INR are improving. - Significantly elevated ferritin (1456) along with iron saturation of 101. HFE gene analysis has been ordered by primary team. - Not enough fluid for diagnostic paracentesis based on IR report.  - I will repeat CT abdomen pelvis with IV contrast because of generalized abdominal tenderness.  - complete alcohol abstinence advised - occult blood was positive in July 2018 but negative during this admission. May need EGD as well as a flexible sigmoidoscopy at some point.   - CT scan in July 2018 showed normal-appearing liver.ultrasound on 03/11/2017 showed small amount of ascites and nonspecific gallbladder wall thickening. Subsequent HIDA scan showed patency of the cystic duct and common bile duct. - negative hepatitis panel during this admission. Extensive workup including negative antimitochondrial and anti-smooth muscle antibody, Normal alpha-1 antitrypsin  level in the past.low ceruloplasmin level in the past.     [name] MD, [place] 03/14/2017, 10:48 AM  Contact #  [phone]

## 2017-03-15 DIAGNOSIS — I10 Essential (primary) hypertension: Secondary | ICD-10-CM

## 2017-03-15 DIAGNOSIS — K7011 Alcoholic hepatitis with ascites: Principal | ICD-10-CM

## 2017-03-15 DIAGNOSIS — D649 Anemia, unspecified: Secondary | ICD-10-CM

## 2017-03-15 LAB — COMPREHENSIVE METABOLIC PANEL
ALT: 53 U/L (ref 17–63)
AST: 119 U/L — AB (ref 15–41)
Albumin: 1.5 g/dL — ABNORMAL LOW (ref 3.5–5.0)
Alkaline Phosphatase: 409 U/L — ABNORMAL HIGH (ref 38–126)
Anion gap: 6 (ref 5–15)
BILIRUBIN TOTAL: 6 mg/dL — AB (ref 0.3–1.2)
CO2: 24 mmol/L (ref 22–32)
Calcium: 8 mg/dL — ABNORMAL LOW (ref 8.9–10.3)
Chloride: 103 mmol/L (ref 101–111)
Creatinine, Ser: 0.83 mg/dL (ref 0.61–1.24)
GFR calc non Af Amer: >60 mL/min (ref >60)
GLUCOSE: 94 mg/dL (ref 65–99)
Potassium: 3.8 mmol/L (ref 3.5–5.1)
Sodium: 133 mmol/L — ABNORMAL LOW (ref 135–145)
Total Protein: 5.8 g/dL — ABNORMAL LOW (ref 6.5–8.1)

## 2017-03-15 LAB — HEMOGLOBIN AND HEMATOCRIT, BLOOD
HCT: 26.5 % — ABNORMAL LOW (ref 39.0–52.0)
Hemoglobin: 9 g/dL — ABNORMAL LOW (ref 13.0–17.0)

## 2017-03-15 LAB — GAMMA GT: GGT: 1029 U/L — ABNORMAL HIGH (ref 7–50)

## 2017-03-15 MED ORDER — KETOROLAC TROMETHAMINE 10 MG PO TABS
10.0000 mg | ORAL_TABLET | Freq: Once | ORAL | Status: AC | PRN
  Administered 2017-03-15: 10 mg via ORAL
  Filled 2017-03-15: qty 1

## 2017-03-15 NOTE — Clinical Social Work Note (Signed)
Clinical Social Work Assessment  Patient Details  Name: Devin [name] MRN: [id number] Date of Birth: 1960/06/14  Date of referral:  03/15/17               Reason for consult:  Housing Concerns/Homelessness                Permission sought to share information with:  Facility Art therapist granted to share information::  Yes, Verbal Permission Granted  Name::     Devin [name]  Agency::     Relationship::  niece  Contact Information:  [phone]  Housing/Transportation Living arrangements for the past 2 months:  No permanent address Source of Information:  Patient Patient Interpreter Needed:  None Criminal Activity/Legal Involvement Pertinent to Current Situation/Hospitalization:  No - Comment as needed Significant Relationships:  Other Family Members Lives with:  Self Do you feel safe going back to the place where you live?  Yes Need for family participation in patient care:  No (Coment)  Care giving concerns:  Patient is unstably housed.   Social Worker assessment / plan: CSW met with patient at bedside. Patient does not have a permanent address, but reports he is going to stay with his niece, Devin [name], and she will pick him up from the hospital this evening around 5 pm. CSW left message for niece, requesting call back to confirm. Patient is aware of shelters and emergency shelters in the area. Nurse aware of plan for niece to pick up patient. CSW signing off.  Employment status:  Unemployed Forensic scientist:  Self Pay (Medicaid Pending) PT Recommendations:  Not assessed at this time Information / Referral to community resources:  Shelter  Patient/Family's Response to care:  Patient appreciative of care.  Patient/Family's Understanding of and Emotional Response to Diagnosis, Current Treatment, and Prognosis: Did not discuss emotional reaction to treatment.  Emotional Assessment Appearance:  Appears stated age Attitude/Demeanor/Rapport:   Other(appropriate) Affect (typically observed):  Calm, Pleasant Orientation:  Oriented to Self, Oriented to Place, Oriented to  Time, Oriented to Situation Alcohol / Substance use:  Not Applicable Psych involvement (Current and /or in the community):  No (Comment)  Discharge Needs  Concerns to be addressed:  Homelessness Readmission within the last 30 days:  No Current discharge risk:  Homeless Barriers to Discharge:  No Barriers Identified   Estanislado Emms, LCSW 03/15/2017, 11:52 AM

## 2017-03-15 NOTE — Progress Notes (Signed)
   Subjective: Doing well this AM. He is able to tolerate oral intake and ambulate appropriately. Discussed the results of his CT abdomen. He will need follow-up with GI and his PCP. Encouraged alcohol cessation. He is asking that we do not put anything about his diagnosis or alcohol on his discharge paperwork. Will provide the information separately. All questions and concerns answered.   Objective: Vital signs in last 24 hours: Vitals:   03/14/17 1024 03/14/17 1749 03/14/17 2022 03/15/17 0502  BP: 118/64 (!) 139/95 132/74 128/84  Pulse: 83 75 76 76  Resp:  16 17 18   Temp:   98.5 F (36.9 C) 98.3 F (36.8 C)  TempSrc:   Oral Oral  SpO2:  99% (!) 10% 100%  Weight:      Height:       General: Thin male in no acute distress Pulm: Good air movement no wheezing or crackles  CV: RRR, no murmurs, no rubs Abdomen: Active bowel sounds, soft, minimal tenderness to palpation  Extremities: No LE edema  Assessment/Plan:  1.Acutely Decompensated Alcoholic Liver Disease -Hepatic function tests trending back towards baseline  - Encouraged alcohol cessation - Advance diet as tolerated and Zofran for nausea - CT Abdomen showing no focal abnormalities of the liver and some ascites not amendable to paracentesis. - HFE gene ordered  - Needs to follow-up with GI as outpatient  - Will arrange outpatient follow-up with the internal medicine clinic   2. Normocytic Anemia  - Baseline Hgb near 12, down to 9.0 - Checking Hgb electrophoresis  - HFE genes pending  - Continue to monitor  - Follow-up with GI as outpatient. Needs EGD and Colonoscopy   3. Hypertension  - Continue home metoprolol  Dispo: Discharge today.   Ina Homes, MD 03/15/2017, 5:25 AM My Pager: [phone]

## 2017-03-15 NOTE — Discharge Instructions (Signed)
Thank you for allowing [place] to provide your care.   - We have not changed any of your medications.   - Follow-up with your primary care doctor down in the Internal Medicine Clinic.   - Call the stomach doctors [phone]) to schedule a follow-up

## 2017-03-15 NOTE — Care Management Note (Signed)
Case Management Note  Patient Details  Name: DHILLON COMUNALE MRN: [id number] Date of Birth: May 12, 1960  Subjective/Objective:                 Patient with order to DC to home today. Chart reviewed. No Home Health or Equipment needs, no unacknowledged Case Management consults or medication needs identified at the time of this note. New medications are OTC. CSW consulted patient for homeless resources. Plan for DC to home.   Action/Plan:   Expected Discharge Date:  03/15/17               Expected Discharge Plan:  Home/Self Care  In-House Referral:     Discharge planning Services  CM Consult  Post Acute Care Choice:    Choice offered to:     DME Arranged:    DME Agency:     HH Arranged:    HH Agency:     Status of Service:  Completed, signed off  If discussed at [employer] of Stay Meetings, dates discussed:    Additional Comments:  Carles Collet, RN 03/15/2017, 1:20 PM

## 2017-03-15 NOTE — Discharge Summary (Signed)
Name: Devin [name] MRN: [id number] DOB: 1960/11/16 56 y.o. PCP: Devin Roan, MD  Date of Admission: 03/11/2017  6:15 PM Date of Discharge: 03/15/2017 Attending Physician: Devin Crews, MD  Discharge Diagnosis: 1. Alcoholic Hepatitis  2. Hypertension  3. Normocytic Anemia   Principal Problem:   Alcoholic hepatitis with ascites Active Problems:   Essential hypertension   Normocytic anemia  Discharge Medications: Allergies as of 03/15/2017   No Known Allergies     Medication List    STOP taking these medications   [name] tartrate 50 MG tablet Commonly known as:  LOPRESSOR     TAKE these medications   acetaminophen 650 MG CR tablet Commonly known as:  TYLENOL Take 1,300 mg every 8 (eight) hours as needed by mouth for pain.   aspirin EC 81 MG tablet Take 1 tablet (81 mg total) by mouth daily.   atorvastatin 40 MG tablet Commonly known as:  LIPITOR Take 1 tablet (40 mg total) by mouth daily.   [name] succinate 100 MG 24 hr tablet Commonly known as:  TOPROL-XL Take 1 tablet (100 mg total) by mouth daily. Take with or immediately following a meal.   nitroGLYCERIN 0.4 MG SL tablet Commonly known as:  NITROSTAT Place 0.4 mg under the tongue every 5 (five) minutes as needed for chest pain.   omeprazole 20 MG capsule Commonly known as:  PRILOSEC Take 1 capsule (20 mg total) by mouth 2 (two) times daily before a meal. What changed:  Another medication with the same name was removed. Continue taking this medication, and follow the directions you see here.   sertraline 50 MG tablet Commonly known as:  ZOLOFT Take 1 tablet (50 mg total) by mouth daily.   sucralfate 1 g tablet Commonly known as:  CARAFATE Take 1 tablet (1 g total) by mouth 4 (four) times daily -  with meals and at bedtime.      Disposition and follow-up:   [name] was discharged from [facility] in Stable condition.  At the hospital follow up  visit please address:  1.  Alcoholic Hepatitis. Continue to encourage alcohol cessation. Follow-up on HFE gene testing.   2.  Labs / imaging needed at time of follow-up: None  3.  Pending labs/ test needing follow-up: HFE gene testing  Follow-up Appointments: Follow-up Information    Devin Roan, MD Follow up on 03/29/2017.   Specialty:  Internal Medicine Why:  at 1:15PM Contact information: [place] [address] [phone]        Devin Brace, MD Follow up.   Specialty:  Gastroenterology Contact information: [place] [address] [phone]          Hospital Course by problem list: Principal Problem:   Alcoholic hepatitis with ascites Active Problems:   Essential hypertension   Normocytic anemia   1. Alcoholic Hepatitis. [name] is a 56 y.o. Male with a PMHx significant for alcoholic liver disease and hypertension who presented to the ED with 3-4 days of N/V and diarrhea. Initial labs were remarkable for transaminitis, hyperbilirubinemia, and elevated PT. RUQ ultrasound illustrated some pericholecystitis fluid and minimal gallbladder wall thickening but no evidence of obstruction or stones. HIDA scan was performed that showed an EF of 100% and no obstruction but did illustrate poor uptake in the liver consistent with acute hepatitis or chronic liver disease. Chart review illustrated that [name] has been admitted several times for similar  presentation. During those hospitalizations he had extensive work-up including but not limited to abdominal imaging, antimitochondrial and antismooth muscle, alpha-1 antitrypsin, ceruloplasmin, and viral hepatitis panels which have all returned unremarkable. Work-up during this hospitalization were again unremarkable. His ferritin and iron saturation did return extremely elevated and he was sent for HFE gene testing. For now the most likely diagnosis with acutely decompensated alcoholic  liver disease. Alcohol cessation was encouraged. The patients AST and ALT trend back to normal range. ALP, bilirubin, and PT were all trending down back to normal prior to discharge. He was discharged in stable condition with instructions to follow-up with this PCP and GI doctor.   2. Hypertension. Devin [name] was continued during his hospitalization. No changes to his hypertensive regimen were made.      3. Normocytic Anemia. Devin [name] previously had a macrocytic anemia with a baseline Hgb around 12. On presentation he had a normocytic anemia with an Hgb of 10 that acutely dropped to 8. No overt signs of bleeding were apparent. While LDH and haptaglobin were consistent with hemolysis they can be falsely skewed in liver disease. Coombs test was negative. Blood smear illustrate no schistocytes but did show target cells and spherocytes which are consistent with abnormal cholesterol metabolism and liver disease. TSH was within normal limits. Hemoglobin electrophoresis was normal. Iron studies illustrated a Iron sat of 101% and ferritin of >1400. Hemochromatosis gene sequencing was sent; however, both of these abnormal results can be seen in liver disease. Hgb trended back up over the course of his hospitalization. He was discharged with instructions to follow-up with GI.   Discharge Vitals:   BP 121/79   Pulse 71   Temp 98.3 F (36.8 C) (Oral)   Resp 18   Ht 5\' 10"  (1.778 m)   Wt 151 lb 1.6 oz (68.5 kg)   SpO2 100%   BMI 21.68 kg/m   Pertinent Labs, Studies, and Procedures:   Right Upper Quadrant Ultrasound 1. Small amount of ascites with nonspecific mild gallbladder wall thickening measuring 4.5 mm. No gallstones or sludge.  HIDA 1. Delayed hepatic uptake and biliary excretion compatible with underlying chronic hepatic disease or hepatitis. 2. Gallbladder is visualized as well as activity progressing into the small bowel compatible with patency of the cystic duct and common bile  duct 3. Calculated ejection fraction following ingestion of ensure estimated at 100%.  CT Abdomen with Contrast  1. New moderate volume of ascites with small left-greater-than-right pleural effusions and mild mesenteric edema. 2. Mild pericholecystic fluid, likely related to above. No evidence of cholelithiasis or biliary dilatation. 3. No focal bowel abnormality seen.  Discharge Instructions: Discharge Instructions    Diet - low sodium heart healthy   Complete by:  As directed    Increase activity slowly   Complete by:  As directed      Signed: Ina Homes, MD 03/15/2017, 11:09 AM   My Pager: [phone]

## 2017-03-15 NOTE — Progress Notes (Signed)
Berlinda Last to be D/C'd home with niece per MD order. Discussed with the patient and all questions fully answered.  Allergies as of 03/15/2017   No Known Allergies     Medication List    STOP taking these medications   metoprolol tartrate 50 MG tablet Commonly known as:  LOPRESSOR     TAKE these medications   acetaminophen 650 MG CR tablet Commonly known as:  TYLENOL Take 1,300 mg every 8 (eight) hours as needed by mouth for pain.   aspirin EC 81 MG tablet Take 1 tablet (81 mg total) by mouth daily.   atorvastatin 40 MG tablet Commonly known as:  LIPITOR Take 1 tablet (40 mg total) by mouth daily.   metoprolol succinate 100 MG 24 hr tablet Commonly known as:  TOPROL-XL Take 1 tablet (100 mg total) by mouth daily. Take with or immediately following a meal.   nitroGLYCERIN 0.4 MG SL tablet Commonly known as:  NITROSTAT Place 0.4 mg under the tongue every 5 (five) minutes as needed for chest pain.   omeprazole 20 MG capsule Commonly known as:  PRILOSEC Take 1 capsule (20 mg total) by mouth 2 (two) times daily before a meal. What changed:  Another medication with the same name was removed. Continue taking this medication, and follow the directions you see here.   sertraline 50 MG tablet Commonly known as:  ZOLOFT Take 1 tablet (50 mg total) by mouth daily.   sucralfate 1 g tablet Commonly known as:  CARAFATE Take 1 tablet (1 g total) by mouth 4 (four) times daily -  with meals and at bedtime.       VVS, Skin clean, dry and intact without evidence of skin break down, no evidence of skin tears noted.  IV catheter discontinued intact. Site without signs and symptoms of complications. Dressing and pressure applied.  An After Visit Summary was printed and given to the patient.  Patient escorted, and D/C home via private auto.  Melonie [place]  03/15/2017 6:40 PM

## 2017-03-15 NOTE — Progress Notes (Signed)
[facility] Gastroenterology Progress Note  Devin [name] 56 y.o. 06-05-1960  CC:  Drop in hemoglobin, abnormal LFTs   Subjective: Patient feeling better today. Abdominal pain is improving. Denied nausea vomiting or diarrhea.denied blood in the stool or black stool   ROS : Negative for chest pain And shortness of breath   Objective: Vital signs in last 24 hours: Vitals:   03/15/17 0502 03/15/17 0809  BP: 128/84 121/79  Pulse: 76 71  Resp: 18   Temp: 98.3 F (36.8 C)   SpO2: 100%     Physical Exam:  General:  Alert, cooperative, no distress, appears stated age     Eyes:  Scleral icterus noted   Lungs:   Clear to auscultation bilaterally, respirations unlabored  Heart:  Regular rate and rhythm, S1, S2 normal  Abdomen:   Soft,mild right upper quadrant tenderness to palpation without any rebound and guarding, bowel sounds present no masses,   Extremities: Extremities normal, atraumatic, no  edema       Lab Results: Recent Labs    03/14/17 0703 03/15/17 0531  NA 135 133*  K 3.7 3.8  CL 106 103  CO2 23 24  GLUCOSE 103* 94  BUN 6 <5*  CREATININE 0.92 0.83  CALCIUM 8.0* 8.0*   Recent Labs    03/14/17 0703 03/15/17 0531  AST 146* 119*  ALT 60 53  ALKPHOS 478* 409*  BILITOT 7.7* 6.0*  PROT 5.9* 5.8*  ALBUMIN 1.5* 1.5*   Recent Labs    03/13/17 0504 03/14/17 0703 03/15/17 0531  WBC 5.4 5.5  --   HGB 8.7* 9.0* 9.0*  HCT 24.9* 26.1* 26.5*  MCV 89.6 89.1  --   PLT 105* 108*  --    Recent Labs    03/13/17 0504  LABPROT 18.6*  INR 1.57      Assessment/Plan: - nausea vomiting and diarrhea. Resolved now - Abnormal LFTs. Most likely from alcoholic hepatitis.discriminant function score of 23.8 on admission - Rectal bleeding. Intermittent for many months. Patient attributes that to hemorrhoids. - Homelessness - history of LA grade  C esophagitis based on EGD in January 2018 - alcohol use - thrombocytopenia  Recommendations ----------------------- -  Patient's LFTs and INR are improving. - Significantly elevated ferritin (1456) along with iron saturation of 101. HFE gene analysis has been ordered by primary team - results pending - Not enough fluid for diagnostic paracentesis based on IR report.  - CT abdomen pelvis showed no acute changes. It also showed normal-appearing liver  - complete alcohol abstinence advised  - occult blood was positive in July 2018 but negative during this admission. May need EGD as well as a flexible sigmoidoscopy at some point.   - CT scan in July 2018 showed normal-appearing liver.ultrasound on 03/11/2017 showed small amount of ascites and nonspecific gallbladder wall thickening. Subsequent HIDA scan showed patency of the cystic duct and common bile duct. - negative hepatitis panel during this admission. Extensive workup including negative antimitochondrial and anti-smooth muscle antibody, Normal alpha-1 antitrypsin level in the past.low ceruloplasmin level in the past.  -  Ok to Discharge from GI standpoint. Follow-up in GI clinic in 4-6 weeks.  - GI will sign off. Call us back if needed   [name] MD, [place] 03/15/2017, 12:31 PM  Contact #  [phone]

## 2017-03-16 LAB — HEPATITIS B E ANTIBODY: HEP B E AB: NEGATIVE

## 2017-03-16 LAB — PORPHOBILINOGEN, RANDOM URINE: Quantitative Porphobilinogen: 1.1 mg/L (ref 0.0–2.0)

## 2017-03-21 LAB — HEMOCHROMATOSIS DNA-PCR(C282Y,H63D)

## 2017-05-08 ENCOUNTER — Encounter (HOSPITAL_COMMUNITY): Payer: Self-pay | Admitting: *Deleted

## 2017-05-08 ENCOUNTER — Emergency Department (HOSPITAL_COMMUNITY): Payer: Medicaid Other

## 2017-05-08 ENCOUNTER — Emergency Department (HOSPITAL_COMMUNITY)
Admission: EM | Admit: 2017-05-08 | Discharge: 2017-05-08 | Disposition: A | Discharge status: Home / Self Care | Payer: Medicaid Other | Attending: Emergency Medicine | Admitting: Emergency Medicine

## 2017-05-08 DIAGNOSIS — R110 Nausea: Secondary | ICD-10-CM | POA: Diagnosis not present

## 2017-05-08 DIAGNOSIS — I252 Old myocardial infarction: Secondary | ICD-10-CM | POA: Insufficient documentation

## 2017-05-08 DIAGNOSIS — I10 Essential (primary) hypertension: Secondary | ICD-10-CM | POA: Diagnosis not present

## 2017-05-08 DIAGNOSIS — I2510 Atherosclerotic heart disease of native coronary artery without angina pectoris: Secondary | ICD-10-CM | POA: Insufficient documentation

## 2017-05-08 DIAGNOSIS — Z7982 Long term (current) use of aspirin: Secondary | ICD-10-CM | POA: Diagnosis not present

## 2017-05-08 DIAGNOSIS — R630 Anorexia: Secondary | ICD-10-CM | POA: Diagnosis not present

## 2017-05-08 DIAGNOSIS — R1013 Epigastric pain: Secondary | ICD-10-CM | POA: Diagnosis not present

## 2017-05-08 DIAGNOSIS — R6883 Chills (without fever): Secondary | ICD-10-CM | POA: Diagnosis not present

## 2017-05-08 DIAGNOSIS — F17210 Nicotine dependence, cigarettes, uncomplicated: Secondary | ICD-10-CM | POA: Insufficient documentation

## 2017-05-08 DIAGNOSIS — R0789 Other chest pain: Secondary | ICD-10-CM | POA: Diagnosis present

## 2017-05-08 DIAGNOSIS — R197 Diarrhea, unspecified: Secondary | ICD-10-CM | POA: Insufficient documentation

## 2017-05-08 DIAGNOSIS — Z79899 Other long term (current) drug therapy: Secondary | ICD-10-CM | POA: Insufficient documentation

## 2017-05-08 LAB — BASIC METABOLIC PANEL
Anion gap: 12 (ref 5–15)
BUN: 8 mg/dL (ref 6–20)
CO2: 21 mmol/L — ABNORMAL LOW (ref 22–32)
CREATININE: 0.72 mg/dL (ref 0.61–1.24)
Calcium: 8.9 mg/dL (ref 8.9–10.3)
Chloride: 102 mmol/L (ref 101–111)
GFR calc Af Amer: >60 mL/min (ref >60)
GLUCOSE: 88 mg/dL (ref 65–99)
POTASSIUM: 3.7 mmol/L (ref 3.5–5.1)
Sodium: 135 mmol/L (ref 135–145)

## 2017-05-08 LAB — CBC
HCT: 36.8 % — ABNORMAL LOW (ref 39.0–52.0)
Hemoglobin: 11.8 g/dL — ABNORMAL LOW (ref 13.0–17.0)
MCH: 33.1 pg (ref 26.0–34.0)
MCHC: 32.1 g/dL (ref 30.0–36.0)
MCV: 103.1 fL — AB (ref 78.0–100.0)
PLATELETS: 168 10*3/uL (ref 150–400)
RBC: 3.57 MIL/uL — AB (ref 4.22–5.81)
RDW: 12.9 % (ref 11.5–15.5)
WBC: 3.7 10*3/uL — ABNORMAL LOW (ref 4.0–10.5)

## 2017-05-08 LAB — I-STAT TROPONIN, ED
Troponin i, poc: 0.01 ng/mL (ref 0.00–0.08)
Troponin i, poc: 0.01 ng/mL (ref 0.00–0.08)

## 2017-05-08 LAB — HEPATIC FUNCTION PANEL
ALBUMIN: 3.5 g/dL (ref 3.5–5.0)
ALK PHOS: 333 U/L — AB (ref 38–126)
ALT: 73 U/L — ABNORMAL HIGH (ref 17–63)
AST: 196 U/L — ABNORMAL HIGH (ref 15–41)
Bilirubin, Direct: 0.4 mg/dL (ref 0.1–0.5)
Indirect Bilirubin: 1.2 mg/dL — ABNORMAL HIGH (ref 0.3–0.9)
Total Bilirubin: 1.6 mg/dL — ABNORMAL HIGH (ref 0.3–1.2)
Total Protein: 8 g/dL (ref 6.5–8.1)

## 2017-05-08 LAB — LIPASE, BLOOD: LIPASE: 36 U/L (ref 11–51)

## 2017-05-08 LAB — ETHANOL: Alcohol, Ethyl (B): <10 mg/dL (ref <10)

## 2017-05-08 MED ORDER — SODIUM CHLORIDE 0.9 % IV BOLUS (SEPSIS)
1000.0000 mL | Freq: Once | INTRAVENOUS | Status: AC
Start: 2017-05-08 — End: 2017-05-08
  Administered 2017-05-08: 1000 mL via INTRAVENOUS

## 2017-05-08 MED ORDER — DICYCLOMINE HCL 10 MG PO CAPS
10.0000 mg | ORAL_CAPSULE | Freq: Once | ORAL | Status: AC
  Administered 2017-05-08: 10 mg via ORAL
  Filled 2017-05-08: qty 1

## 2017-05-08 MED ORDER — ONDANSETRON HCL 4 MG/2ML IJ SOLN
4.0000 mg | Freq: Once | INTRAMUSCULAR | Status: AC
  Administered 2017-05-08: 4 mg via INTRAVENOUS
  Filled 2017-05-08: qty 2

## 2017-05-08 MED ORDER — DICYCLOMINE HCL 20 MG PO TABS: 20.0000 mg | ORAL_TABLET | Freq: Two times a day (BID) | ORAL | 0 refills | Status: DC

## 2017-05-08 MED ORDER — KETOROLAC TROMETHAMINE 30 MG/ML IJ SOLN
15.0000 mg | Freq: Once | INTRAMUSCULAR | Status: AC
  Administered 2017-05-08: 15 mg via INTRAVENOUS
  Filled 2017-05-08: qty 1
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
## 2017-05-08 NOTE — Discharge Instructions (Signed)
As discussed, your evaluation today has been largely reassuring.  But, it is important that you monitor your condition carefully, and do not hesitate to return to the ED if you develop new, or concerning changes in your condition. ? ?Otherwise, please follow-up with your physician for appropriate ongoing care. ? ?

## 2017-05-08 NOTE — ED Notes (Signed)
Lab work, radiology results and vital signs reviewed, no critical results at this time, no change in acuity indicated.  

## 2017-05-08 NOTE — ED Notes (Addendum)
Pt told Pharmacy chest pain is worsening.  This RN went to assess.  Patient denies changes in associated symptoms or location of chest pain, only c/o increasing intensity.  Reassured patient and informed him of his location in the waiting queue

## 2017-05-08 NOTE — ED Triage Notes (Signed)
Per EMS- pt reports sharp left chest pain that radiates to the rt side with SOB and Nausea. Pt states that this started 1 hour ago. Pt also reports black stools and states "I have liver problems and was in here last time for that". Pt received 2 nitro and 324mg  of asprin en route. With no change in pain

## 2017-05-13 ENCOUNTER — Encounter (HOSPITAL_COMMUNITY): Payer: Self-pay | Admitting: Emergency Medicine

## 2017-05-13 ENCOUNTER — Emergency Department (HOSPITAL_COMMUNITY)
Admission: EM | Admit: 2017-05-13 | Discharge: 2017-05-13 | Disposition: A | Discharge status: Home / Self Care | Payer: Medicaid Other | Attending: Emergency Medicine | Admitting: Emergency Medicine

## 2017-05-13 ENCOUNTER — Emergency Department (HOSPITAL_COMMUNITY): Payer: Medicaid Other

## 2017-05-13 DIAGNOSIS — M10032 Idiopathic gout, left wrist: Secondary | ICD-10-CM | POA: Diagnosis not present

## 2017-05-13 DIAGNOSIS — F17210 Nicotine dependence, cigarettes, uncomplicated: Secondary | ICD-10-CM | POA: Insufficient documentation

## 2017-05-13 DIAGNOSIS — M7989 Other specified soft tissue disorders: Secondary | ICD-10-CM | POA: Diagnosis present

## 2017-05-13 DIAGNOSIS — I10 Essential (primary) hypertension: Secondary | ICD-10-CM | POA: Diagnosis not present

## 2017-05-13 DIAGNOSIS — I252 Old myocardial infarction: Secondary | ICD-10-CM | POA: Insufficient documentation

## 2017-05-13 DIAGNOSIS — Z79899 Other long term (current) drug therapy: Secondary | ICD-10-CM | POA: Insufficient documentation

## 2017-05-13 DIAGNOSIS — I2510 Atherosclerotic heart disease of native coronary artery without angina pectoris: Secondary | ICD-10-CM | POA: Diagnosis not present

## 2017-05-13 DIAGNOSIS — Z7982 Long term (current) use of aspirin: Secondary | ICD-10-CM | POA: Diagnosis not present

## 2017-05-13 DIAGNOSIS — M109 Gout, unspecified: Secondary | ICD-10-CM

## 2017-05-13 LAB — CBC WITH DIFFERENTIAL/PLATELET
Basophils Absolute: 0 10*3/uL (ref 0.0–0.1)
Basophils Relative: 0 %
EOS ABS: 0 10*3/uL (ref 0.0–0.7)
EOS PCT: 0 %
HCT: 38.6 % — ABNORMAL LOW (ref 39.0–52.0)
Hemoglobin: 12.6 g/dL — ABNORMAL LOW (ref 13.0–17.0)
LYMPHS ABS: 0.8 10*3/uL (ref 0.7–4.0)
LYMPHS PCT: 8 %
MCH: 33.7 pg (ref 26.0–34.0)
MCHC: 32.6 g/dL (ref 30.0–36.0)
MCV: 103.2 fL — AB (ref 78.0–100.0)
Monocytes Absolute: 1.3 10*3/uL — ABNORMAL HIGH (ref 0.1–1.0)
Monocytes Relative: 13 %
Neutro Abs: 8 10*3/uL — ABNORMAL HIGH (ref 1.7–7.7)
Neutrophils Relative %: 79 %
PLATELETS: 202 10*3/uL (ref 150–400)
RBC: 3.74 MIL/uL — AB (ref 4.22–5.81)
RDW: 13.2 % (ref 11.5–15.5)
WBC: 10.1 10*3/uL (ref 4.0–10.5)

## 2017-05-13 LAB — HEPATIC FUNCTION PANEL
ALT: 60 U/L (ref 17–63)
AST: 120 U/L — ABNORMAL HIGH (ref 15–41)
Albumin: 3.8 g/dL (ref 3.5–5.0)
Alkaline Phosphatase: 310 U/L — ABNORMAL HIGH (ref 38–126)
BILIRUBIN DIRECT: 0.3 mg/dL (ref 0.1–0.5)
BILIRUBIN INDIRECT: 0.3 mg/dL (ref 0.3–0.9)
BILIRUBIN TOTAL: 0.6 mg/dL (ref 0.3–1.2)
Total Protein: 8.4 g/dL — ABNORMAL HIGH (ref 6.5–8.1)

## 2017-05-13 LAB — BASIC METABOLIC PANEL
Anion gap: 17 — ABNORMAL HIGH (ref 5–15)
BUN: 9 mg/dL (ref 6–20)
CALCIUM: 8.8 mg/dL — AB (ref 8.9–10.3)
CO2: 16 mmol/L — ABNORMAL LOW (ref 22–32)
CREATININE: 0.75 mg/dL (ref 0.61–1.24)
Chloride: 101 mmol/L (ref 101–111)
GFR calc Af Amer: >60 mL/min (ref >60)
GLUCOSE: 75 mg/dL (ref 65–99)
POTASSIUM: 3.9 mmol/L (ref 3.5–5.1)
SODIUM: 134 mmol/L — AB (ref 135–145)

## 2017-05-13 LAB — URIC ACID: URIC ACID, SERUM: 7.6 mg/dL (ref 4.4–7.6)

## 2017-05-13 MED ORDER — OXYCODONE-ACETAMINOPHEN 5-325 MG PO TABS: 1.0000 | ORAL_TABLET | ORAL | 0 refills | Status: DC | PRN

## 2017-05-13 MED ORDER — SODIUM CHLORIDE 0.9 % IV BOLUS (SEPSIS)
1000.0000 mL | Freq: Once | INTRAVENOUS | Status: AC
  Administered 2017-05-13: 1000 mL via INTRAVENOUS

## 2017-05-13 MED ORDER — INDOMETHACIN 50 MG PO CAPS: 50.0000 mg | ORAL_CAPSULE | Freq: Two times a day (BID) | ORAL | 0 refills | Status: DC

## 2017-05-13 MED ORDER — OXYCODONE-ACETAMINOPHEN 5-325 MG PO TABS
1.0000 | ORAL_TABLET | Freq: Once | ORAL | Status: AC
  Administered 2017-05-13: 1 via ORAL
  Filled 2017-05-13: qty 1

## 2017-05-13 MED ORDER — KETOROLAC TROMETHAMINE 30 MG/ML IJ SOLN
30.0000 mg | Freq: Once | INTRAMUSCULAR | Status: AC
  Administered 2017-05-13: 30 mg via INTRAVENOUS
  Filled 2017-05-13: qty 1

## 2017-05-13 MED ORDER — METOPROLOL TARTRATE 25 MG PO TABS: 50.0000 mg | ORAL_TABLET | Freq: Two times a day (BID) | ORAL | Status: DC

## 2017-05-13 MED ORDER — METOPROLOL TARTRATE 25 MG PO TABS
50.0000 mg | ORAL_TABLET | Freq: Once | ORAL | Status: AC
  Administered 2017-05-13: 50 mg via ORAL
  Filled 2017-05-13: qty 2
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
## 2017-05-13 NOTE — ED Notes (Signed)
Ortho paged. 

## 2017-05-13 NOTE — Discharge Instructions (Signed)
Take medications as prescribed for your pain in your left wrist.  Wear brace for comfort.  Please follow-up with family doctor.  Return if worsening

## 2017-05-13 NOTE — Progress Notes (Signed)
Orthopedic Tech Progress Note Patient Details:  Devin [name] 09-30-1960 [id number]  Ortho Devices Type of Ortho Device: Velcro wrist splint Ortho Device/Splint Location: lue Ortho Device/Splint Interventions: Ordered, Application, Adjustment   Post Interventions Patient Tolerated: Well Instructions Provided: Care of device, Adjustment of device   Karolee Stamps 05/13/2017, 9:43 PM
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
## 2017-05-13 NOTE — ED Notes (Signed)
Ortho at bedside.

## 2017-05-27 ENCOUNTER — Other Ambulatory Visit: Payer: Self-pay

## 2017-05-27 ENCOUNTER — Inpatient Hospital Stay (HOSPITAL_COMMUNITY)
Admission: EM | Admit: 2017-05-27 | Discharge: 2017-05-29 | DRG: 313 | Disposition: A | Discharge status: Home / Self Care | Payer: Medicaid Other | Attending: Student in an Organized Health Care Education/Training Program | Admitting: Student in an Organized Health Care Education/Training Program

## 2017-05-27 ENCOUNTER — Encounter (HOSPITAL_COMMUNITY): Payer: Self-pay

## 2017-05-27 ENCOUNTER — Emergency Department (HOSPITAL_COMMUNITY): Payer: Medicaid Other

## 2017-05-27 DIAGNOSIS — R05 Cough: Secondary | ICD-10-CM

## 2017-05-27 DIAGNOSIS — Z0389 Encounter for observation for other suspected diseases and conditions ruled out: Secondary | ICD-10-CM

## 2017-05-27 DIAGNOSIS — R10817 Generalized abdominal tenderness: Secondary | ICD-10-CM

## 2017-05-27 DIAGNOSIS — Z8249 Family history of ischemic heart disease and other diseases of the circulatory system: Secondary | ICD-10-CM

## 2017-05-27 DIAGNOSIS — K219 Gastro-esophageal reflux disease without esophagitis: Secondary | ICD-10-CM | POA: Diagnosis not present

## 2017-05-27 DIAGNOSIS — K7011 Alcoholic hepatitis with ascites: Secondary | ICD-10-CM | POA: Diagnosis present

## 2017-05-27 DIAGNOSIS — I2510 Atherosclerotic heart disease of native coronary artery without angina pectoris: Secondary | ICD-10-CM | POA: Diagnosis not present

## 2017-05-27 DIAGNOSIS — D539 Nutritional anemia, unspecified: Secondary | ICD-10-CM | POA: Diagnosis not present

## 2017-05-27 DIAGNOSIS — Z7982 Long term (current) use of aspirin: Secondary | ICD-10-CM

## 2017-05-27 DIAGNOSIS — R079 Chest pain, unspecified: Secondary | ICD-10-CM | POA: Diagnosis present

## 2017-05-27 DIAGNOSIS — K209 Esophagitis, unspecified without bleeding: Secondary | ICD-10-CM | POA: Diagnosis present

## 2017-05-27 DIAGNOSIS — IMO0001 Reserved for inherently not codable concepts without codable children: Secondary | ICD-10-CM

## 2017-05-27 DIAGNOSIS — K210 Gastro-esophageal reflux disease with esophagitis: Secondary | ICD-10-CM | POA: Diagnosis present

## 2017-05-27 DIAGNOSIS — I43 Cardiomyopathy in diseases classified elsewhere: Secondary | ICD-10-CM | POA: Diagnosis present

## 2017-05-27 DIAGNOSIS — I10 Essential (primary) hypertension: Secondary | ICD-10-CM | POA: Diagnosis not present

## 2017-05-27 DIAGNOSIS — Z91128 Patient's intentional underdosing of medication regimen for other reason: Secondary | ICD-10-CM

## 2017-05-27 DIAGNOSIS — I119 Hypertensive heart disease without heart failure: Secondary | ICD-10-CM | POA: Diagnosis present

## 2017-05-27 DIAGNOSIS — Z5900 Homelessness unspecified: Secondary | ICD-10-CM

## 2017-05-27 DIAGNOSIS — F1010 Alcohol abuse, uncomplicated: Secondary | ICD-10-CM | POA: Diagnosis present

## 2017-05-27 DIAGNOSIS — F17210 Nicotine dependence, cigarettes, uncomplicated: Secondary | ICD-10-CM | POA: Diagnosis present

## 2017-05-27 DIAGNOSIS — R140 Abdominal distension (gaseous): Secondary | ICD-10-CM

## 2017-05-27 DIAGNOSIS — E785 Hyperlipidemia, unspecified: Secondary | ICD-10-CM | POA: Diagnosis present

## 2017-05-27 DIAGNOSIS — K0889 Other specified disorders of teeth and supporting structures: Secondary | ICD-10-CM | POA: Diagnosis not present

## 2017-05-27 DIAGNOSIS — Z79899 Other long term (current) drug therapy: Secondary | ICD-10-CM

## 2017-05-27 DIAGNOSIS — R634 Abnormal weight loss: Secondary | ICD-10-CM | POA: Diagnosis not present

## 2017-05-27 DIAGNOSIS — Z9114 Patient's other noncompliance with medication regimen: Secondary | ICD-10-CM

## 2017-05-27 DIAGNOSIS — J189 Pneumonia, unspecified organism: Secondary | ICD-10-CM

## 2017-05-27 DIAGNOSIS — R0789 Other chest pain: Secondary | ICD-10-CM | POA: Diagnosis not present

## 2017-05-27 DIAGNOSIS — M109 Gout, unspecified: Secondary | ICD-10-CM | POA: Diagnosis present

## 2017-05-27 DIAGNOSIS — K7010 Alcoholic hepatitis without ascites: Secondary | ICD-10-CM | POA: Diagnosis present

## 2017-05-27 DIAGNOSIS — I252 Old myocardial infarction: Secondary | ICD-10-CM | POA: Diagnosis not present

## 2017-05-27 DIAGNOSIS — F1020 Alcohol dependence, uncomplicated: Secondary | ICD-10-CM | POA: Diagnosis present

## 2017-05-27 DIAGNOSIS — Z720 Tobacco use: Secondary | ICD-10-CM | POA: Diagnosis present

## 2017-05-27 DIAGNOSIS — T50906A Underdosing of unspecified drugs, medicaments and biological substances, initial encounter: Secondary | ICD-10-CM | POA: Diagnosis present

## 2017-05-27 DIAGNOSIS — F329 Major depressive disorder, single episode, unspecified: Secondary | ICD-10-CM

## 2017-05-27 DIAGNOSIS — F1099 Alcohol use, unspecified with unspecified alcohol-induced disorder: Secondary | ICD-10-CM

## 2017-05-27 DIAGNOSIS — R000 Tachycardia, unspecified: Secondary | ICD-10-CM

## 2017-05-27 HISTORY — DX: Alcoholic liver disease, unspecified: K70.9

## 2017-05-27 HISTORY — DX: Encounter for observation for other suspected diseases and conditions ruled out: Z03.89

## 2017-05-27 LAB — RAPID URINE DRUG SCREEN, HOSP PERFORMED
Amphetamines: NOT DETECTED
Barbiturates: NOT DETECTED
Benzodiazepines: NOT DETECTED
COCAINE: NOT DETECTED
OPIATES: NOT DETECTED
Tetrahydrocannabinol: POSITIVE — AB

## 2017-05-27 LAB — BASIC METABOLIC PANEL
Anion gap: 18 — ABNORMAL HIGH (ref 5–15)
BUN: 8 mg/dL (ref 6–20)
CALCIUM: 9 mg/dL (ref 8.9–10.3)
CO2: 19 mmol/L — ABNORMAL LOW (ref 22–32)
Chloride: 104 mmol/L (ref 101–111)
Creatinine, Ser: 0.98 mg/dL (ref 0.61–1.24)
GFR calc Af Amer: >60 mL/min (ref >60)
Glucose, Bld: 152 mg/dL — ABNORMAL HIGH (ref 65–99)
POTASSIUM: 3.4 mmol/L — AB (ref 3.5–5.1)
Sodium: 141 mmol/L (ref 135–145)

## 2017-05-27 LAB — CBC WITH DIFFERENTIAL/PLATELET
BASOS PCT: 0 %
Basophils Absolute: 0 10*3/uL (ref 0.0–0.1)
EOS ABS: 0.1 10*3/uL (ref 0.0–0.7)
EOS PCT: 2 %
HCT: 37 % — ABNORMAL LOW (ref 39.0–52.0)
Hemoglobin: 12.2 g/dL — ABNORMAL LOW (ref 13.0–17.0)
Lymphocytes Relative: 24 %
Lymphs Abs: 1.3 10*3/uL (ref 0.7–4.0)
MCH: 33.2 pg (ref 26.0–34.0)
MCHC: 33 g/dL (ref 30.0–36.0)
MCV: 100.8 fL — ABNORMAL HIGH (ref 78.0–100.0)
MONO ABS: 0.3 10*3/uL (ref 0.1–1.0)
Monocytes Relative: 6 %
Neutro Abs: 3.6 10*3/uL (ref 1.7–7.7)
Neutrophils Relative %: 68 %
PLATELETS: 282 10*3/uL (ref 150–400)
RBC: 3.67 MIL/uL — AB (ref 4.22–5.81)
RDW: 13 % (ref 11.5–15.5)
WBC: 5.3 10*3/uL (ref 4.0–10.5)

## 2017-05-27 LAB — I-STAT TROPONIN, ED: TROPONIN I, POC: 0.01 ng/mL (ref 0.00–0.08)

## 2017-05-27 LAB — D-DIMER, QUANTITATIVE: D-Dimer, Quant: 0.43 ug/mL-FEU (ref 0.00–0.50)

## 2017-05-27 LAB — BRAIN NATRIURETIC PEPTIDE: B NATRIURETIC PEPTIDE 5: 30.9 pg/mL (ref 0.0–100.0)

## 2017-05-27 MED ORDER — ENOXAPARIN SODIUM 40 MG/0.4ML ~~LOC~~ SOLN
40.0000 mg | SUBCUTANEOUS | Status: DC
  Administered 2017-05-28 – 2017-05-29 (×2): 40 mg via SUBCUTANEOUS
  Filled 2017-05-27 (×2): qty 0.4

## 2017-05-27 MED ORDER — LORAZEPAM 1 MG PO TABS
1.0000 mg | ORAL_TABLET | Freq: Four times a day (QID) | ORAL | Status: DC | PRN
  Administered 2017-05-28 (×2): 1 mg via ORAL
  Filled 2017-05-27 (×2): qty 1

## 2017-05-27 MED ORDER — LORAZEPAM 2 MG/ML IJ SOLN: 1.0000 mg | Freq: Four times a day (QID) | INTRAMUSCULAR | Status: DC | PRN

## 2017-05-27 MED ORDER — PANTOPRAZOLE SODIUM 40 MG PO TBEC
40.0000 mg | DELAYED_RELEASE_TABLET | Freq: Every day | ORAL | Status: DC
  Administered 2017-05-28: 40 mg via ORAL
  Filled 2017-05-27: qty 1

## 2017-05-27 MED ORDER — CEFTRIAXONE SODIUM 1 G IJ SOLR
1.0000 g | Freq: Once | INTRAMUSCULAR | Status: AC
  Administered 2017-05-27: 1 g via INTRAVENOUS
  Filled 2017-05-27: qty 10

## 2017-05-27 MED ORDER — ASPIRIN EC 81 MG PO TBEC
81.0000 mg | DELAYED_RELEASE_TABLET | Freq: Every day | ORAL | Status: DC
  Administered 2017-05-28 – 2017-05-29 (×2): 81 mg via ORAL
  Filled 2017-05-27 (×2): qty 1

## 2017-05-27 MED ORDER — SERTRALINE HCL 50 MG PO TABS
50.0000 mg | ORAL_TABLET | Freq: Every day | ORAL | Status: DC
  Administered 2017-05-28 – 2017-05-29 (×2): 50 mg via ORAL
  Filled 2017-05-27 (×2): qty 1

## 2017-05-27 MED ORDER — SODIUM CHLORIDE 0.9 % IV BOLUS (SEPSIS)
500.0000 mL | Freq: Once | INTRAVENOUS | Status: AC
  Administered 2017-05-27: 500 mL via INTRAVENOUS

## 2017-05-27 MED ORDER — ONDANSETRON HCL 4 MG PO TABS: 4.0000 mg | ORAL_TABLET | Freq: Four times a day (QID) | ORAL | Status: DC | PRN

## 2017-05-27 MED ORDER — ATORVASTATIN CALCIUM 40 MG PO TABS
40.0000 mg | ORAL_TABLET | Freq: Every day | ORAL | Status: DC
  Administered 2017-05-28: 40 mg via ORAL
  Filled 2017-05-27 (×2): qty 1

## 2017-05-27 MED ORDER — SUCRALFATE 1 G PO TABS
1.0000 g | ORAL_TABLET | Freq: Three times a day (TID) | ORAL | Status: DC
  Administered 2017-05-28 – 2017-05-29 (×6): 1 g via ORAL
  Filled 2017-05-27 (×6): qty 1

## 2017-05-27 MED ORDER — METOPROLOL SUCCINATE ER 25 MG PO TB24
100.0000 mg | ORAL_TABLET | Freq: Every day | ORAL | Status: DC
  Administered 2017-05-28 – 2017-05-29 (×2): 100 mg via ORAL
  Filled 2017-05-27 (×2): qty 4

## 2017-05-27 MED ORDER — ONDANSETRON HCL 4 MG/2ML IJ SOLN: 4.0000 mg | Freq: Four times a day (QID) | INTRAMUSCULAR | Status: DC | PRN

## 2017-05-27 MED ORDER — THIAMINE HCL 100 MG/ML IJ SOLN
Freq: Once | INTRAVENOUS | Status: AC
  Administered 2017-05-28: via INTRAVENOUS
  Filled 2017-05-27: qty 1000

## 2017-05-27 MED ORDER — GI COCKTAIL ~~LOC~~
30.0000 mL | Freq: Once | ORAL | Status: AC
  Administered 2017-05-27: 30 mL via ORAL
  Filled 2017-05-27: qty 30

## 2017-05-27 MED ORDER — AZITHROMYCIN 500 MG IV SOLR
500.0000 mg | Freq: Once | INTRAVENOUS | Status: AC
  Administered 2017-05-27: 500 mg via INTRAVENOUS
  Filled 2017-05-27: qty 500

## 2017-05-27 NOTE — ED Triage Notes (Signed)
[facility]- pt coming from [facility], complaint of chest pain. HR 120, 152/102, nitro dropped BP to 87/49. 20g LAC, 113/68 most recent, spo2% 95, 324mg  of aspirin given. Pt alert and oriented. No distress noted.
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
## 2017-05-28 ENCOUNTER — Encounter (HOSPITAL_COMMUNITY): Payer: Self-pay | Admitting: Cardiology

## 2017-05-28 ENCOUNTER — Observation Stay (HOSPITAL_COMMUNITY): Payer: Medicaid Other

## 2017-05-28 ENCOUNTER — Other Ambulatory Visit: Payer: Self-pay

## 2017-05-28 ENCOUNTER — Observation Stay (HOSPITAL_BASED_OUTPATIENT_CLINIC_OR_DEPARTMENT_OTHER): Payer: Medicaid Other

## 2017-05-28 DIAGNOSIS — IMO0001 Reserved for inherently not codable concepts without codable children: Secondary | ICD-10-CM

## 2017-05-28 DIAGNOSIS — I10 Essential (primary) hypertension: Secondary | ICD-10-CM

## 2017-05-28 DIAGNOSIS — Z0389 Encounter for observation for other suspected diseases and conditions ruled out: Secondary | ICD-10-CM

## 2017-05-28 DIAGNOSIS — R072 Precordial pain: Secondary | ICD-10-CM

## 2017-05-28 DIAGNOSIS — R079 Chest pain, unspecified: Secondary | ICD-10-CM

## 2017-05-28 LAB — COMPREHENSIVE METABOLIC PANEL
ALT: 36 U/L (ref 17–63)
AST: 77 U/L — AB (ref 15–41)
Albumin: 3.3 g/dL — ABNORMAL LOW (ref 3.5–5.0)
Alkaline Phosphatase: 252 U/L — ABNORMAL HIGH (ref 38–126)
Anion gap: 12 (ref 5–15)
BUN: 7 mg/dL (ref 6–20)
CO2: 23 mmol/L (ref 22–32)
CREATININE: 0.99 mg/dL (ref 0.61–1.24)
Calcium: 9 mg/dL (ref 8.9–10.3)
Chloride: 103 mmol/L (ref 101–111)
GFR calc Af Amer: >60 mL/min (ref >60)
GFR calc non Af Amer: >60 mL/min (ref >60)
Glucose, Bld: 120 mg/dL — ABNORMAL HIGH (ref 65–99)
POTASSIUM: 4.1 mmol/L (ref 3.5–5.1)
SODIUM: 138 mmol/L (ref 135–145)
Total Bilirubin: 0.5 mg/dL (ref 0.3–1.2)
Total Protein: 8.1 g/dL (ref 6.5–8.1)

## 2017-05-28 LAB — TROPONIN I: Troponin I: <0.03 ng/mL (ref <0.03)

## 2017-05-28 LAB — ECHOCARDIOGRAM COMPLETE

## 2017-05-28 MED ORDER — PANTOPRAZOLE SODIUM 40 MG PO TBEC
40.0000 mg | DELAYED_RELEASE_TABLET | Freq: Two times a day (BID) | ORAL | Status: DC
  Administered 2017-05-28 – 2017-05-29 (×2): 40 mg via ORAL
  Filled 2017-05-28 (×2): qty 1
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
## 2017-05-28 NOTE — ED Notes (Signed)
Heart Healthy Diet ordered for Lunch. 

## 2017-05-28 NOTE — Progress Notes (Signed)
  Echocardiogram 2D Echocardiogram has been performed.  Devin [name] 05/28/2017, 3:25 PM

## 2017-05-29 ENCOUNTER — Other Ambulatory Visit: Payer: Self-pay | Admitting: Pharmacist

## 2017-05-29 ENCOUNTER — Telehealth: Payer: Self-pay | Admitting: Internal Medicine

## 2017-05-29 DIAGNOSIS — M109 Gout, unspecified: Secondary | ICD-10-CM | POA: Diagnosis present

## 2017-05-29 DIAGNOSIS — F1099 Alcohol use, unspecified with unspecified alcohol-induced disorder: Secondary | ICD-10-CM | POA: Diagnosis not present

## 2017-05-29 DIAGNOSIS — I252 Old myocardial infarction: Secondary | ICD-10-CM | POA: Diagnosis not present

## 2017-05-29 DIAGNOSIS — R079 Chest pain, unspecified: Secondary | ICD-10-CM | POA: Diagnosis not present

## 2017-05-29 DIAGNOSIS — I43 Cardiomyopathy in diseases classified elsewhere: Secondary | ICD-10-CM | POA: Diagnosis present

## 2017-05-29 DIAGNOSIS — Z590 Homelessness: Secondary | ICD-10-CM | POA: Diagnosis not present

## 2017-05-29 DIAGNOSIS — K210 Gastro-esophageal reflux disease with esophagitis: Secondary | ICD-10-CM | POA: Diagnosis present

## 2017-05-29 DIAGNOSIS — T50906A Underdosing of unspecified drugs, medicaments and biological substances, initial encounter: Secondary | ICD-10-CM | POA: Diagnosis present

## 2017-05-29 DIAGNOSIS — R0789 Other chest pain: Secondary | ICD-10-CM | POA: Diagnosis not present

## 2017-05-29 DIAGNOSIS — F331 Major depressive disorder, recurrent, moderate: Secondary | ICD-10-CM

## 2017-05-29 DIAGNOSIS — Z7982 Long term (current) use of aspirin: Secondary | ICD-10-CM | POA: Diagnosis not present

## 2017-05-29 DIAGNOSIS — Z91128 Patient's intentional underdosing of medication regimen for other reason: Secondary | ICD-10-CM | POA: Diagnosis not present

## 2017-05-29 DIAGNOSIS — I10 Essential (primary) hypertension: Secondary | ICD-10-CM | POA: Diagnosis not present

## 2017-05-29 DIAGNOSIS — D539 Nutritional anemia, unspecified: Secondary | ICD-10-CM | POA: Diagnosis present

## 2017-05-29 DIAGNOSIS — Z79899 Other long term (current) drug therapy: Secondary | ICD-10-CM | POA: Diagnosis not present

## 2017-05-29 DIAGNOSIS — I2510 Atherosclerotic heart disease of native coronary artery without angina pectoris: Secondary | ICD-10-CM | POA: Diagnosis present

## 2017-05-29 DIAGNOSIS — I119 Hypertensive heart disease without heart failure: Secondary | ICD-10-CM | POA: Diagnosis present

## 2017-05-29 DIAGNOSIS — F17210 Nicotine dependence, cigarettes, uncomplicated: Secondary | ICD-10-CM | POA: Diagnosis present

## 2017-05-29 DIAGNOSIS — F1010 Alcohol abuse, uncomplicated: Secondary | ICD-10-CM | POA: Diagnosis present

## 2017-05-29 DIAGNOSIS — F329 Major depressive disorder, single episode, unspecified: Secondary | ICD-10-CM | POA: Diagnosis present

## 2017-05-29 DIAGNOSIS — E785 Hyperlipidemia, unspecified: Secondary | ICD-10-CM | POA: Diagnosis present

## 2017-05-29 DIAGNOSIS — K7011 Alcoholic hepatitis with ascites: Secondary | ICD-10-CM | POA: Diagnosis present

## 2017-05-29 LAB — COMPREHENSIVE METABOLIC PANEL
ALK PHOS: 251 U/L — AB (ref 38–126)
ALT: 36 U/L (ref 17–63)
ANION GAP: 12 (ref 5–15)
AST: 74 U/L — ABNORMAL HIGH (ref 15–41)
Albumin: 3.4 g/dL — ABNORMAL LOW (ref 3.5–5.0)
BUN: 5 mg/dL — ABNORMAL LOW (ref 6–20)
CALCIUM: 9.8 mg/dL (ref 8.9–10.3)
CO2: 20 mmol/L — ABNORMAL LOW (ref 22–32)
CREATININE: 0.82 mg/dL (ref 0.61–1.24)
Chloride: 104 mmol/L (ref 101–111)
Glucose, Bld: 106 mg/dL — ABNORMAL HIGH (ref 65–99)
Potassium: 3.9 mmol/L (ref 3.5–5.1)
Sodium: 136 mmol/L (ref 135–145)
Total Bilirubin: 0.6 mg/dL (ref 0.3–1.2)
Total Protein: 8 g/dL (ref 6.5–8.1)

## 2017-05-29 LAB — HEMOGLOBIN A1C
HEMOGLOBIN A1C: 5.5 % (ref 4.8–5.6)
MEAN PLASMA GLUCOSE: 111.15 mg/dL

## 2017-05-29 MED ORDER — ATORVASTATIN CALCIUM 40 MG PO TABS: 40.0000 mg | ORAL_TABLET | Freq: Every day | ORAL | 0 refills | Status: DC

## 2017-05-29 MED ORDER — OMEPRAZOLE 20 MG PO CPDR: 20.0000 mg | DELAYED_RELEASE_CAPSULE | Freq: Two times a day (BID) | ORAL | 0 refills | Status: DC

## 2017-05-29 MED ORDER — ASPIRIN EC 81 MG PO TBEC: 81.0000 mg | DELAYED_RELEASE_TABLET | Freq: Every day | ORAL | 0 refills | Status: DC

## 2017-05-29 MED ORDER — SERTRALINE HCL 50 MG PO TABS: 50.0000 mg | ORAL_TABLET | Freq: Every day | ORAL | 0 refills | Status: DC

## 2017-05-29 MED ORDER — METOPROLOL SUCCINATE ER 100 MG PO TB24: 100.0000 mg | ORAL_TABLET | Freq: Every day | ORAL | 0 refills | Status: DC

## 2017-05-29 MED ORDER — NITROGLYCERIN 0.4 MG SL SUBL: 0.4000 mg | SUBLINGUAL_TABLET | SUBLINGUAL | 0 refills | Status: DC | PRN

## 2017-05-29 MED ORDER — ATORVASTATIN CALCIUM 80 MG PO TABS: 80.0000 mg | ORAL_TABLET | Freq: Every day | ORAL | Status: DC

## 2017-05-29 MED FILL — ASPIRIN ADULT LOW STRENGTH: 81 | 30 days supply | Qty: 30 | Fill #0

## 2017-05-29 MED FILL — SERTRALINE HCL 50 MG TABLET: 50 | 30 days supply | Qty: 30 | Fill #0

## 2017-05-29 MED FILL — NITROGLYCERIN 0.4 MG TAB SL: 0.4 | 10 days supply | Qty: 25 | Fill #0

## 2017-05-29 MED FILL — ATORVASTATIN 40 MG TABLET: 40 | 30 days supply | Qty: 30 | Fill #0

## 2017-05-29 MED FILL — OMEPRAZOLE 20 MG CAP: 20 | 30 days supply | Qty: 60 | Fill #0

## 2017-05-29 MED FILL — METOPROLOL SUCC ER 100 MG T: 100 | 30 days supply | Qty: 30 | Fill #0

## 2017-05-29 NOTE — Discharge Instructions (Signed)
Thank you for allowing [place] to provide your care. Please take all your medications as prescribed. We have scheduled you for a follow-up visit on Friday February 1st at 10:15 AM.

## 2017-05-29 NOTE — Plan of Care (Signed)
  Completed/[facility] Behavior/Discharge Planning: Ability to manage health-related needs will improve 05/29/2017 1346 - Completed/Met by Alonna Buckler, RN Clinical Measurements: Ability to maintain clinical measurements within normal limits will improve 05/29/2017 1346 - Completed/Met by Alonna Buckler, RN 05/29/2017 [phone] - Progressing by Alonna Buckler, RN Will remain free from infection 05/29/2017 0856 - Completed/Met by Alonna Buckler, RN Diagnostic test results will improve 05/29/2017 1346 - Completed/Met by Alonna Buckler, RN 05/29/2017 [phone] - Progressing by Alonna Buckler, RN Respiratory complications will improve 05/29/2017 0856 - Completed/Met by Alonna Buckler, RN Cardiovascular complication will be avoided 05/29/2017 1346 - Completed/Met by Alonna Buckler, RN 05/29/2017 [phone] - Progressing by Alonna Buckler, RN Activity: Risk for activity intolerance will decrease 05/29/2017 0856 - Completed/Met by Alonna Buckler, RN Nutrition: Adequate nutrition will be maintained 05/29/2017 0856 - Completed/Met by Alonna Buckler, RN Coping: Level of anxiety will decrease 05/29/2017 0856 - Completed/Met by Alonna Buckler, RN Elimination: Will not experience complications related to bowel motility 05/29/2017 0856 - Completed/Met by Alonna Buckler, RN Will not experience complications related to urinary retention 05/29/2017 0856 - Completed/Met by Alonna Buckler, RN Pain Managment: General experience of comfort will improve 05/29/2017 0856 - Completed/Met by Alonna Buckler, RN Skin Integrity: Risk for impaired skin integrity will decrease 05/29/2017 0856 - Completed/Met by Alonna Buckler, RN

## 2017-05-29 NOTE — Progress Notes (Signed)
Reviewed all discharge instructions with patient and he stated understanding.  MD from [facility] called me and stated they are bringing home medicines to patient room for him to take with him and discharge and he is not to leave before they are delivered to his room.  This was explained to patient and he stated understanding.  Patient stated he needs a bus pass for transportation home.  SW contacted to obtain bus pass.  Patient confirmed he has cell phone, phone charger, back pack with personal belongings, and clothing that he brought to hospital on admission.  No voiced complaints.

## 2017-05-29 NOTE — Telephone Encounter (Signed)
F/u hospital per Dr. Tarri Abernethy; appt 06/01/2017 10:15am Lucina Mellow

## 2017-05-29 NOTE — Plan of Care (Signed)
  Progressing Clinical Measurements: Ability to maintain clinical measurements within normal limits will improve 05/29/2017 0856 - Progressing by Alonna Buckler, RN Diagnostic test results will improve 05/29/2017 0856 - Progressing by Alonna Buckler, RN Cardiovascular complication will be avoided 05/29/2017 0856 - Progressing by Alonna Buckler, RN

## 2017-05-29 NOTE — Progress Notes (Signed)
Reviewed medications with patients and helped with short-term access. Patient was referred to [facility] for re-enrollment into their free pharmacy program. Patient verbalized understanding and was advised to contact me if further help is needed.

## 2017-05-29 NOTE — Progress Notes (Signed)
Patient not found in room.  SW, Clarise Cruz, confirmed she gave him bus pass.  When I last spoke with patient an hour ago I confirmed we were waiting for MD to bring his home med supply from clinic and aske dthat he call me when he had his meds.  Patient did not call to inform me of meds delivered.

## 2017-05-29 NOTE — Progress Notes (Signed)
   Subjective: Patient is doing well this morning. States that his chest pain has since resolved. He is tolerating oral intake and ambulating without difficulty. We discussed the importance of getting his medications and taking them as prescribed. We discussed close follow-up as an outpatient. I will arrange a follow-up with [facility] within the next few days. Patient agrees with plan. All questions and concerns addressed.  Objective: Vital signs in last 24 hours: Vitals:   05/28/17 1522 05/28/17 1938 05/28/17 2234 05/29/17 0013  BP: (!) 156/94 (!) 166/111 (!) 150/102 (!) 158/105  Pulse: 87 80  89  Resp: 18 18  18   Temp: 98.1 F (36.7 C) 98.6 F (37 C)  99.3 F (37.4 C)  TempSrc: Oral Oral  Oral  SpO2: 97% 99%  99%  Weight: 157 lb 8 oz (71.4 kg)     Height: 5\' 10"  (1.778 m)      General: Well-nourished male in no acute distress Pulm: Good air movement with no wheezing or crackles CV: Regular rate and rhythm, no murmurs, no reps Abdomen: Active bowel sounds, soft, non-distended, no tenderness to palpation  Extremities: No lower extremity edema   Assessment/Plan:  Devin [name] is a 57 year old male with CAD, alcohol use disorder, and hypertension who presented to the emergency department with acute onset chest pain associated with shortness of breath, diaphoresis, and dizziness. His symptoms have since resolved. Further management as outlined below.   Chest Pain  - Patient presented with sharp/stabbing midsternal chest pain that lasted approximately 30 minutes was not associated with exertion, not relieved with rest, and not relieved with sublingual nitro. - Cardiology has evaluated the patient and do not feel further ischemic evaluation is warranted.  - Echocardiogram preformed yesterday illustrating EF 60-65% with G1DD, LAE, and no other valvular abnormalities  Dyspepsia - Patient presented with decreased appetite, nausea/vomiting chills and denies traditional GERD symptoms. -  Discharge with Protonix 40 mg twice daily  Alcohol use disorder - Continues to use alcohol, but has decreased his intake to 1-240 ounce beers per week - Continue CIWA protocol  Hypertension - Continue metoprolol 100 mg daily  Dispo: Patient is medically stable for discharge. Will arrange follow-up with [facility] and discuss how we can get the patient his home medications.  Ina Homes, MD 05/29/2017, 5:35 AM My Pager: [phone]
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
## 2017-05-29 NOTE — Clinical Social Work Note (Signed)
Clinical Social Work Assessment  Patient Details  Name: Devin [name] MRN: [id number] Date of Birth: 1960-05-28  Date of referral:  05/29/17               Reason for consult:  Housing Concerns/Homelessness, [employer], Ecologist sought to share information with:    Permission granted to share information::  No  Name::        Agency::     Relationship::     Contact Information:     Housing/Transportation Living arrangements for the past 2 months:  Single Family Home Source of Information:  Patient, Medical Team Patient Interpreter Needed:  None Criminal Activity/Legal Involvement Pertinent to Current Situation/Hospitalization:  No - Comment as needed Significant Relationships:  Other Family Members, Friend(Niece) Lives with:  Self Do you feel safe going back to the place where you live?  Yes Need for family participation in patient care:  Yes (Comment)  Care giving concerns:  Homelessness.   Social Worker assessment / plan:  CSW met with patient. No supports at bedside. CSW introduced role and inquired about needs. Patient confirmed he is recently homeless. He was staying with his niece but she has kicked him out. Patient plans to go to a shelter tonight. CSW provided shelter list, community resources, and booklets for food pantries and free meals. Also gave bus pass. No further concerns. CSW signing off as social work intervention is no longer needed.  Employment status:  Retired Forensic scientist:  Medicaid [place] PT Recommendations:  Not assessed at this time Information / Referral to community resources:  Other (Comment Required), Shelter(Community and food resources, bus pass.)  Patient/Family's Response to care:  Patient agreeable to receiving resources. Patient notes a supportive case worker. Patient appreciated social work intervention.  Patient/Family's Understanding of and Emotional Response to Diagnosis, Current Treatment,  and Prognosis:  Patient has a good understanding of the reason for admission and social work consult. Patient appears happy with hospital care.  Emotional Assessment Appearance:  Appears stated age Attitude/Demeanor/Rapport:  Engaged, Gracious Affect (typically observed):  Accepting, Appropriate, Calm, Pleasant Orientation:  Oriented to Self, Oriented to Place, Oriented to  Time, Oriented to Situation Alcohol / Substance use:  Tobacco Use, Alcohol Use, Illicit Drugs Psych involvement (Current and /or in the community):  No (Comment)  Discharge Needs  Concerns to be addressed:  Care Coordination Readmission within the last 30 days:  No Current discharge risk:  Homeless Barriers to Discharge:  No Barriers Identified   Candie Chroman, LCSW 05/29/2017, 2:47 PM

## 2017-05-30 ENCOUNTER — Emergency Department (HOSPITAL_COMMUNITY): Payer: Medicaid Other

## 2017-05-30 ENCOUNTER — Encounter (HOSPITAL_COMMUNITY): Payer: Self-pay | Admitting: Emergency Medicine

## 2017-05-30 ENCOUNTER — Emergency Department (HOSPITAL_COMMUNITY)
Admission: EM | Admit: 2017-05-30 | Discharge: 2017-05-30 | Disposition: A | Discharge status: Home / Self Care | Payer: Medicaid Other | Attending: Emergency Medicine | Admitting: Emergency Medicine

## 2017-05-30 ENCOUNTER — Other Ambulatory Visit: Payer: Self-pay

## 2017-05-30 DIAGNOSIS — W268XXA Contact with other sharp object(s), not elsewhere classified, initial encounter: Secondary | ICD-10-CM | POA: Diagnosis not present

## 2017-05-30 DIAGNOSIS — Z79899 Other long term (current) drug therapy: Secondary | ICD-10-CM | POA: Diagnosis not present

## 2017-05-30 DIAGNOSIS — F17210 Nicotine dependence, cigarettes, uncomplicated: Secondary | ICD-10-CM | POA: Diagnosis not present

## 2017-05-30 DIAGNOSIS — I10 Essential (primary) hypertension: Secondary | ICD-10-CM | POA: Insufficient documentation

## 2017-05-30 DIAGNOSIS — I252 Old myocardial infarction: Secondary | ICD-10-CM | POA: Diagnosis not present

## 2017-05-30 DIAGNOSIS — Y939 Activity, unspecified: Secondary | ICD-10-CM | POA: Diagnosis not present

## 2017-05-30 DIAGNOSIS — Z7982 Long term (current) use of aspirin: Secondary | ICD-10-CM | POA: Insufficient documentation

## 2017-05-30 DIAGNOSIS — Y929 Unspecified place or not applicable: Secondary | ICD-10-CM | POA: Insufficient documentation

## 2017-05-30 DIAGNOSIS — Y999 Unspecified external cause status: Secondary | ICD-10-CM | POA: Diagnosis not present

## 2017-05-30 DIAGNOSIS — Z23 Encounter for immunization: Secondary | ICD-10-CM | POA: Diagnosis not present

## 2017-05-30 DIAGNOSIS — S61412A Laceration without foreign body of left hand, initial encounter: Secondary | ICD-10-CM | POA: Diagnosis not present

## 2017-05-30 DIAGNOSIS — S6982XA Other specified injuries of left wrist, hand and finger(s), initial encounter: Secondary | ICD-10-CM | POA: Diagnosis present

## 2017-05-30 MED ORDER — LIDOCAINE-EPINEPHRINE (PF) 2 %-1:200000 IJ SOLN
10.0000 mL | Freq: Once | INTRAMUSCULAR | Status: DC
  Filled 2017-05-30: qty 20

## 2017-05-30 MED ORDER — TETANUS-DIPHTHERIA TOXOIDS TD 5-2 LFU IM INJ
0.5000 mL | INJECTION | Freq: Once | INTRAMUSCULAR | Status: DC
  Filled 2017-05-30: qty 0.5

## 2017-05-30 MED ORDER — TETANUS-DIPHTH-ACELL PERTUSSIS 5-2.5-18.5 LF-MCG/0.5 IM SUSP
0.5000 mL | Freq: Once | INTRAMUSCULAR | Status: AC
  Administered 2017-05-30: 0.5 mL via INTRAMUSCULAR
  Filled 2017-05-30: qty 0.5

## 2017-05-30 NOTE — ED Triage Notes (Signed)
Pt brought in by [facility] with a laceration to his left hand  Bleeding controlled  Pt states he cut it on a fence   Last tetanus shot 5-6 years ago
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
## 2017-05-30 NOTE — ED Notes (Signed)
Pt went to restroom and took the dressing off his hand  Came back into triage with hand bleeding   Dressing reapplied

## 2017-06-01 ENCOUNTER — Encounter: Payer: Self-pay | Admitting: Internal Medicine

## 2017-06-01 ENCOUNTER — Ambulatory Visit: Payer: Medicaid Other

## 2017-06-01 NOTE — Telephone Encounter (Signed)
No answer

## 2017-06-04 ENCOUNTER — Encounter: Payer: Self-pay | Admitting: Pharmacist

## 2017-06-04 NOTE — Progress Notes (Signed)
Patient was approved for  [facility] 

## 2017-06-07 NOTE — Telephone Encounter (Signed)
No answer

## 2017-06-07 NOTE — Telephone Encounter (Signed)
No answer, pt has not f/u with appts

## 2017-06-29 ENCOUNTER — Other Ambulatory Visit: Payer: Self-pay

## 2017-06-29 ENCOUNTER — Encounter (HOSPITAL_COMMUNITY): Payer: Self-pay

## 2017-06-29 ENCOUNTER — Emergency Department (HOSPITAL_COMMUNITY): Payer: Medicaid Other

## 2017-06-29 DIAGNOSIS — F17210 Nicotine dependence, cigarettes, uncomplicated: Secondary | ICD-10-CM | POA: Insufficient documentation

## 2017-06-29 DIAGNOSIS — I252 Old myocardial infarction: Secondary | ICD-10-CM | POA: Insufficient documentation

## 2017-06-29 DIAGNOSIS — Z79899 Other long term (current) drug therapy: Secondary | ICD-10-CM | POA: Insufficient documentation

## 2017-06-29 DIAGNOSIS — Z7982 Long term (current) use of aspirin: Secondary | ICD-10-CM | POA: Diagnosis not present

## 2017-06-29 DIAGNOSIS — I10 Essential (primary) hypertension: Secondary | ICD-10-CM | POA: Diagnosis not present

## 2017-06-29 DIAGNOSIS — R072 Precordial pain: Secondary | ICD-10-CM | POA: Insufficient documentation

## 2017-06-29 DIAGNOSIS — R079 Chest pain, unspecified: Secondary | ICD-10-CM | POA: Diagnosis present

## 2017-06-29 LAB — CBC
HCT: 38.1 % — ABNORMAL LOW (ref 39.0–52.0)
Hemoglobin: 13 g/dL (ref 13.0–17.0)
MCH: 32.7 pg (ref 26.0–34.0)
MCHC: 34.1 g/dL (ref 30.0–36.0)
MCV: 95.7 fL (ref 78.0–100.0)
Platelets: 255 10*3/uL (ref 150–400)
RBC: 3.98 MIL/uL — ABNORMAL LOW (ref 4.22–5.81)
RDW: 14.2 % (ref 11.5–15.5)
WBC: 4.5 10*3/uL (ref 4.0–10.5)

## 2017-06-29 LAB — BASIC METABOLIC PANEL
Anion gap: 17 — ABNORMAL HIGH (ref 5–15)
BUN: 10 mg/dL (ref 6–20)
CALCIUM: 9.3 mg/dL (ref 8.9–10.3)
CO2: 16 mmol/L — ABNORMAL LOW (ref 22–32)
Chloride: 97 mmol/L — ABNORMAL LOW (ref 101–111)
Creatinine, Ser: 0.78 mg/dL (ref 0.61–1.24)
GFR calc Af Amer: >60 mL/min (ref >60)
GLUCOSE: 66 mg/dL (ref 65–99)
Potassium: 3.6 mmol/L (ref 3.5–5.1)
Sodium: 130 mmol/L — ABNORMAL LOW (ref 135–145)

## 2017-06-29 LAB — I-STAT TROPONIN, ED: TROPONIN I, POC: 0 ng/mL (ref 0.00–0.08)

## 2017-06-29 NOTE — Care Management (Signed)
Reviewed patient's record, patient has had 5 ED visits in the past 6 months with 1 resulting in a hospitalization 05/2917. Patient apparently left hospital before CM returned with discharge medications. Patient no showed for follow up appointment at [facility] on 06/07/17. Patient is homeless according to [place] note. Patient can follow up at the [facility] with Marliss Coots NP for medical needs. ED CM will notify the [facility] medical staff of patient.

## 2017-06-29 NOTE — ED Triage Notes (Signed)
Pt states that he began to have R sided CP, with SOB, n/v, dizziness. Hx of MI, pt states he took medicine for it that didn't help.

## 2017-06-30 ENCOUNTER — Emergency Department (HOSPITAL_COMMUNITY)
Admission: EM | Admit: 2017-06-30 | Discharge: 2017-06-30 | Disposition: A | Discharge status: Home / Self Care | Payer: Medicaid Other | Attending: Emergency Medicine | Admitting: Emergency Medicine

## 2017-06-30 DIAGNOSIS — R072 Precordial pain: Secondary | ICD-10-CM

## 2017-06-30 LAB — I-STAT TROPONIN, ED: Troponin i, poc: 0.01 ng/mL (ref 0.00–0.08)
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
## 2017-07-07 ENCOUNTER — Emergency Department (HOSPITAL_COMMUNITY): Payer: Medicaid Other

## 2017-07-07 ENCOUNTER — Observation Stay (HOSPITAL_COMMUNITY)
Admission: EM | Admit: 2017-07-07 | Discharge: 2017-07-09 | Disposition: A | Discharge status: Home / Self Care | Payer: Medicaid Other | Attending: Oncology | Admitting: Oncology

## 2017-07-07 ENCOUNTER — Other Ambulatory Visit: Payer: Self-pay

## 2017-07-07 ENCOUNTER — Encounter (HOSPITAL_COMMUNITY): Payer: Self-pay | Admitting: Emergency Medicine

## 2017-07-07 DIAGNOSIS — F10129 Alcohol abuse with intoxication, unspecified: Secondary | ICD-10-CM | POA: Diagnosis not present

## 2017-07-07 DIAGNOSIS — R7989 Other specified abnormal findings of blood chemistry: Secondary | ICD-10-CM

## 2017-07-07 DIAGNOSIS — F1210 Cannabis abuse, uncomplicated: Secondary | ICD-10-CM | POA: Insufficient documentation

## 2017-07-07 DIAGNOSIS — K7010 Alcoholic hepatitis without ascites: Principal | ICD-10-CM | POA: Diagnosis present

## 2017-07-07 DIAGNOSIS — D649 Anemia, unspecified: Secondary | ICD-10-CM | POA: Diagnosis present

## 2017-07-07 DIAGNOSIS — F17210 Nicotine dependence, cigarettes, uncomplicated: Secondary | ICD-10-CM | POA: Insufficient documentation

## 2017-07-07 DIAGNOSIS — M109 Gout, unspecified: Secondary | ICD-10-CM | POA: Diagnosis not present

## 2017-07-07 DIAGNOSIS — Z590 Homelessness: Secondary | ICD-10-CM | POA: Insufficient documentation

## 2017-07-07 DIAGNOSIS — I252 Old myocardial infarction: Secondary | ICD-10-CM | POA: Insufficient documentation

## 2017-07-07 DIAGNOSIS — Z79899 Other long term (current) drug therapy: Secondary | ICD-10-CM | POA: Insufficient documentation

## 2017-07-07 DIAGNOSIS — K2100 Gastro-esophageal reflux disease with esophagitis, without bleeding: Secondary | ICD-10-CM | POA: Diagnosis present

## 2017-07-07 DIAGNOSIS — Y908 Blood alcohol level of 240 mg/100 ml or more: Secondary | ICD-10-CM | POA: Insufficient documentation

## 2017-07-07 DIAGNOSIS — R000 Tachycardia, unspecified: Secondary | ICD-10-CM | POA: Diagnosis present

## 2017-07-07 DIAGNOSIS — J90 Pleural effusion, not elsewhere classified: Secondary | ICD-10-CM | POA: Diagnosis not present

## 2017-07-07 DIAGNOSIS — Z7982 Long term (current) use of aspirin: Secondary | ICD-10-CM | POA: Insufficient documentation

## 2017-07-07 DIAGNOSIS — I10 Essential (primary) hypertension: Secondary | ICD-10-CM

## 2017-07-07 DIAGNOSIS — E785 Hyperlipidemia, unspecified: Secondary | ICD-10-CM | POA: Insufficient documentation

## 2017-07-07 DIAGNOSIS — R945 Abnormal results of liver function studies: Secondary | ICD-10-CM

## 2017-07-07 DIAGNOSIS — I119 Hypertensive heart disease without heart failure: Secondary | ICD-10-CM | POA: Diagnosis not present

## 2017-07-07 DIAGNOSIS — R0789 Other chest pain: Secondary | ICD-10-CM | POA: Diagnosis not present

## 2017-07-07 DIAGNOSIS — F329 Major depressive disorder, single episode, unspecified: Secondary | ICD-10-CM | POA: Diagnosis not present

## 2017-07-07 DIAGNOSIS — I2510 Atherosclerotic heart disease of native coronary artery without angina pectoris: Secondary | ICD-10-CM | POA: Diagnosis not present

## 2017-07-07 LAB — I-STAT TROPONIN, ED: Troponin i, poc: 0 ng/mL (ref 0.00–0.08)

## 2017-07-07 LAB — D-DIMER, QUANTITATIVE: D-Dimer, Quant: 0.34 ug/mL-FEU (ref 0.00–0.50)

## 2017-07-07 LAB — ETHANOL: ALCOHOL ETHYL (B): 274 mg/dL — AB (ref <10)

## 2017-07-07 MED ORDER — SODIUM CHLORIDE 0.9 % IV BOLUS (SEPSIS): 1000.0000 mL | Freq: Once | INTRAVENOUS | Status: DC

## 2017-07-07 MED ORDER — SODIUM CHLORIDE 0.9 % IV BOLUS (SEPSIS)
2000.0000 mL | Freq: Once | INTRAVENOUS | Status: AC
  Administered 2017-07-07: 2000 mL via INTRAVENOUS

## 2017-07-07 NOTE — ED Provider Notes (Signed)
PROGRESS NOTE                                                                                                                 This is a sign-out from Dr. Cathleen Fears at shift change: Devin [name] is a 57 y.o. male presenting with chest pain and alcohol intoxication.  Patient given aspirin and nitro by EMS.  EKG with no ischemic findings, initial troponin negative.  D-dimer negative, chest x-ray without infiltrate. Please refer to previous note for full HPI, ROS, PMH and PE.   Repeat troponin is negative, patient remains persistently tachycardic at rest, despite 2 L bolus.  Will check basic blood work.  IMA globin has dropped 3 points in the last 10 days to 10.  On discussion with this patient he does say that he has had some darker than normal stools, they are normally formed, no diarrhea, no large volume.  He does state that he has had liver dysfunction in the past, he does not follow regularly with gastroenterology.  States that he does not drink daily but when he drinks he drinks very heavily.  Attempted to get a fecal occult blood however, after 2 attempts I am not convinced that the sample is adequate to fully evaluate, it has come back negative but there is no stool in the rectal vault.  Discussed with attending and will admit for s given persistent tachycardia and new anemia.  Protonix bolus initiated.  Discussed with internal medicine teaching service, Dr. Reesa Chew who accepts admission, has asked for GI consult  Gust with Dr. Watt Climes who has added the patient to the list and will evaluate him in the a.m.   Devin [name] 07/08/17 1438    Devin Dakin, MD 07/08/17 [phone]
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
## 2017-07-07 NOTE — ED Triage Notes (Signed)
Pt homeless, BIB EMS with complaints of midsternalchest pain. Pt. Walked to fire station with active CP. Denies SOB, N/V. Hx of MI. Pt. Given 324mg  Aspirin and 1 Nitro with no relief. BP 110/64 after nitro. Pulse - 104, 94%RA. ETOH on board

## 2017-07-08 ENCOUNTER — Encounter (HOSPITAL_COMMUNITY): Payer: Self-pay | Admitting: Internal Medicine

## 2017-07-08 DIAGNOSIS — F17210 Nicotine dependence, cigarettes, uncomplicated: Secondary | ICD-10-CM

## 2017-07-08 DIAGNOSIS — Z7982 Long term (current) use of aspirin: Secondary | ICD-10-CM

## 2017-07-08 DIAGNOSIS — R0789 Other chest pain: Secondary | ICD-10-CM

## 2017-07-08 DIAGNOSIS — Z79899 Other long term (current) drug therapy: Secondary | ICD-10-CM

## 2017-07-08 DIAGNOSIS — F10129 Alcohol abuse with intoxication, unspecified: Secondary | ICD-10-CM

## 2017-07-08 DIAGNOSIS — K709 Alcoholic liver disease, unspecified: Secondary | ICD-10-CM

## 2017-07-08 DIAGNOSIS — I2510 Atherosclerotic heart disease of native coronary artery without angina pectoris: Secondary | ICD-10-CM | POA: Diagnosis not present

## 2017-07-08 DIAGNOSIS — K210 Gastro-esophageal reflux disease with esophagitis: Secondary | ICD-10-CM

## 2017-07-08 DIAGNOSIS — I10 Essential (primary) hypertension: Secondary | ICD-10-CM | POA: Diagnosis not present

## 2017-07-08 DIAGNOSIS — K449 Diaphragmatic hernia without obstruction or gangrene: Secondary | ICD-10-CM

## 2017-07-08 DIAGNOSIS — Z590 Homelessness: Secondary | ICD-10-CM

## 2017-07-08 DIAGNOSIS — D649 Anemia, unspecified: Secondary | ICD-10-CM | POA: Diagnosis not present

## 2017-07-08 DIAGNOSIS — M8448XA Pathological fracture, other site, initial encounter for fracture: Secondary | ICD-10-CM

## 2017-07-08 DIAGNOSIS — R112 Nausea with vomiting, unspecified: Secondary | ICD-10-CM | POA: Diagnosis not present

## 2017-07-08 DIAGNOSIS — K649 Unspecified hemorrhoids: Secondary | ICD-10-CM

## 2017-07-08 DIAGNOSIS — R531 Weakness: Secondary | ICD-10-CM | POA: Diagnosis not present

## 2017-07-08 DIAGNOSIS — R0602 Shortness of breath: Secondary | ICD-10-CM

## 2017-07-08 DIAGNOSIS — F1290 Cannabis use, unspecified, uncomplicated: Secondary | ICD-10-CM

## 2017-07-08 DIAGNOSIS — E785 Hyperlipidemia, unspecified: Secondary | ICD-10-CM | POA: Diagnosis not present

## 2017-07-08 DIAGNOSIS — Z8249 Family history of ischemic heart disease and other diseases of the circulatory system: Secondary | ICD-10-CM

## 2017-07-08 LAB — CBC
HCT: 31.3 % — ABNORMAL LOW (ref 39.0–52.0)
Hemoglobin: 10.6 g/dL — ABNORMAL LOW (ref 13.0–17.0)
MCH: 32.9 pg (ref 26.0–34.0)
MCHC: 33.9 g/dL (ref 30.0–36.0)
MCV: 97.2 fL (ref 78.0–100.0)
Platelets: 184 10*3/uL (ref 150–400)
RBC: 3.22 MIL/uL — AB (ref 4.22–5.81)
RDW: 15.5 % (ref 11.5–15.5)
WBC: 3.6 10*3/uL — AB (ref 4.0–10.5)

## 2017-07-08 LAB — RAPID URINE DRUG SCREEN, HOSP PERFORMED
Amphetamines: NOT DETECTED
BARBITURATES: NOT DETECTED
Benzodiazepines: NOT DETECTED
COCAINE: NOT DETECTED
OPIATES: NOT DETECTED
TETRAHYDROCANNABINOL: NOT DETECTED

## 2017-07-08 LAB — BASIC METABOLIC PANEL
Anion gap: 11 (ref 5–15)
BUN: 15 mg/dL (ref 6–20)
CO2: 19 mmol/L — ABNORMAL LOW (ref 22–32)
Calcium: 7.6 mg/dL — ABNORMAL LOW (ref 8.9–10.3)
Chloride: 108 mmol/L (ref 101–111)
Creatinine, Ser: 1.08 mg/dL (ref 0.61–1.24)
GFR calc Af Amer: >60 mL/min (ref >60)
GLUCOSE: 81 mg/dL (ref 65–99)
Potassium: 3.6 mmol/L (ref 3.5–5.1)
SODIUM: 138 mmol/L (ref 135–145)

## 2017-07-08 LAB — I-STAT TROPONIN, ED: Troponin i, poc: 0 ng/mL (ref 0.00–0.08)

## 2017-07-08 LAB — COMPREHENSIVE METABOLIC PANEL
ALK PHOS: 277 U/L — AB (ref 38–126)
ALT: 83 U/L — AB (ref 17–63)
AST: 277 U/L — AB (ref 15–41)
Albumin: 2.9 g/dL — ABNORMAL LOW (ref 3.5–5.0)
Anion gap: 12 (ref 5–15)
BUN: 13 mg/dL (ref 6–20)
CALCIUM: 7.7 mg/dL — AB (ref 8.9–10.3)
CO2: 17 mmol/L — AB (ref 22–32)
Chloride: 107 mmol/L (ref 101–111)
Creatinine, Ser: 0.94 mg/dL (ref 0.61–1.24)
Glucose, Bld: 75 mg/dL (ref 65–99)
Potassium: 3.8 mmol/L (ref 3.5–5.1)
Sodium: 136 mmol/L (ref 135–145)
Total Bilirubin: 1 mg/dL (ref 0.3–1.2)
Total Protein: 6.5 g/dL (ref 6.5–8.1)

## 2017-07-08 LAB — CBC WITH DIFFERENTIAL/PLATELET
Basophils Absolute: 0 10*3/uL (ref 0.0–0.1)
Basophils Relative: 0 %
EOS ABS: 0 10*3/uL (ref 0.0–0.7)
EOS PCT: 1 %
HCT: 30.4 % — ABNORMAL LOW (ref 39.0–52.0)
Hemoglobin: 10.4 g/dL — ABNORMAL LOW (ref 13.0–17.0)
LYMPHS ABS: 1.5 10*3/uL (ref 0.7–4.0)
LYMPHS PCT: 47 %
MCH: 33 pg (ref 26.0–34.0)
MCHC: 34.2 g/dL (ref 30.0–36.0)
MCV: 96.5 fL (ref 78.0–100.0)
MONO ABS: 0.2 10*3/uL (ref 0.1–1.0)
MONOS PCT: 7 %
Neutro Abs: 1.4 10*3/uL — ABNORMAL LOW (ref 1.7–7.7)
Neutrophils Relative %: 45 %
PLATELETS: 176 10*3/uL (ref 150–400)
RBC: 3.15 MIL/uL — ABNORMAL LOW (ref 4.22–5.81)
RDW: 15.6 % — ABNORMAL HIGH (ref 11.5–15.5)
WBC: 3.2 10*3/uL — AB (ref 4.0–10.5)

## 2017-07-08 LAB — PROTIME-INR
INR: 1.38
Prothrombin Time: 16.9 seconds — ABNORMAL HIGH (ref 11.4–15.2)

## 2017-07-08 LAB — POC OCCULT BLOOD, ED: FECAL OCCULT BLD: NEGATIVE

## 2017-07-08 MED ORDER — ONDANSETRON 4 MG PO TBDP
4.0000 mg | ORAL_TABLET | Freq: Three times a day (TID) | ORAL | Status: DC | PRN
  Administered 2017-07-08: 4 mg via ORAL
  Filled 2017-07-08: qty 1

## 2017-07-08 MED ORDER — SERTRALINE HCL 50 MG PO TABS
50.0000 mg | ORAL_TABLET | Freq: Every day | ORAL | Status: DC
  Administered 2017-07-08 – 2017-07-09 (×2): 50 mg via ORAL
  Filled 2017-07-08 (×2): qty 1

## 2017-07-08 MED ORDER — FOLIC ACID 5 MG/ML IJ SOLN
1.0000 mg | Freq: Every day | INTRAMUSCULAR | Status: DC
  Administered 2017-07-08: 1 mg via INTRAVENOUS
  Filled 2017-07-08: qty 0.2

## 2017-07-08 MED ORDER — ENOXAPARIN SODIUM 40 MG/0.4ML ~~LOC~~ SOLN
40.0000 mg | SUBCUTANEOUS | Status: DC
  Administered 2017-07-08: 40 mg via SUBCUTANEOUS
  Filled 2017-07-08: qty 0.4

## 2017-07-08 MED ORDER — ASPIRIN EC 81 MG PO TBEC
81.0000 mg | DELAYED_RELEASE_TABLET | Freq: Every day | ORAL | Status: DC
  Administered 2017-07-08 – 2017-07-09 (×2): 81 mg via ORAL
  Filled 2017-07-08 (×2): qty 1

## 2017-07-08 MED ORDER — ATORVASTATIN CALCIUM 40 MG PO TABS
40.0000 mg | ORAL_TABLET | Freq: Every day | ORAL | Status: DC
  Administered 2017-07-08 – 2017-07-09 (×2): 40 mg via ORAL
  Filled 2017-07-08 (×2): qty 1

## 2017-07-08 MED ORDER — ACETAMINOPHEN 650 MG RE SUPP: 650.0000 mg | Freq: Four times a day (QID) | RECTAL | Status: DC | PRN

## 2017-07-08 MED ORDER — PANTOPRAZOLE SODIUM 40 MG IV SOLR
80.0000 mg | Freq: Once | INTRAVENOUS | Status: AC
  Administered 2017-07-08: 02:00:00 80 mg via INTRAVENOUS
  Filled 2017-07-08: qty 80

## 2017-07-08 MED ORDER — THIAMINE HCL 100 MG/ML IJ SOLN
100.0000 mg | Freq: Once | INTRAMUSCULAR | Status: AC
  Administered 2017-07-08: 100 mg via INTRAVENOUS
  Filled 2017-07-08: qty 2

## 2017-07-08 MED ORDER — METOPROLOL SUCCINATE ER 100 MG PO TB24
100.0000 mg | ORAL_TABLET | Freq: Every day | ORAL | 0 refills | Status: DC
Start: 2017-07-08 — End: 2017-10-27

## 2017-07-08 MED ORDER — METOPROLOL SUCCINATE ER 100 MG PO TB24
100.0000 mg | ORAL_TABLET | Freq: Every day | ORAL | Status: DC
  Administered 2017-07-08 – 2017-07-09 (×2): 100 mg via ORAL
  Filled 2017-07-08 (×2): qty 1

## 2017-07-08 MED ORDER — VITAMIN B-1 100 MG PO TABS
100.0000 mg | ORAL_TABLET | Freq: Every day | ORAL | Status: DC
  Administered 2017-07-08 – 2017-07-09 (×2): 100 mg via ORAL
  Filled 2017-07-08 (×2): qty 1

## 2017-07-08 MED ORDER — SODIUM CHLORIDE 0.9% FLUSH
3.0000 mL | Freq: Two times a day (BID) | INTRAVENOUS | Status: DC
  Administered 2017-07-08 – 2017-07-09 (×3): 3 mL via INTRAVENOUS

## 2017-07-08 MED ORDER — SUCRALFATE 1 G PO TABS
1.0000 g | ORAL_TABLET | Freq: Three times a day (TID) | ORAL | Status: DC
  Administered 2017-07-08 – 2017-07-09 (×6): 1 g via ORAL
  Filled 2017-07-08 (×6): qty 1

## 2017-07-08 MED ORDER — DICLOFENAC SODIUM 1 % TD GEL
4.0000 g | Freq: Four times a day (QID) | TRANSDERMAL | Status: DC
  Administered 2017-07-08 – 2017-07-09 (×5): 4 g via TOPICAL
  Filled 2017-07-08: qty 100

## 2017-07-08 MED ORDER — ACETAMINOPHEN 325 MG PO TABS
650.0000 mg | ORAL_TABLET | Freq: Four times a day (QID) | ORAL | Status: DC | PRN
  Administered 2017-07-08 (×2): 650 mg via ORAL
  Filled 2017-07-08 (×2): qty 2

## 2017-07-08 MED ORDER — FOLIC ACID 1 MG PO TABS
1.0000 mg | ORAL_TABLET | Freq: Every day | ORAL | Status: DC
  Administered 2017-07-08 – 2017-07-09 (×2): 1 mg via ORAL
  Filled 2017-07-08 (×2): qty 1

## 2017-07-08 MED ORDER — PANTOPRAZOLE SODIUM 40 MG PO TBEC
40.0000 mg | DELAYED_RELEASE_TABLET | Freq: Every day | ORAL | Status: DC
  Administered 2017-07-08 – 2017-07-09 (×2): 40 mg via ORAL
  Filled 2017-07-08 (×2): qty 1

## 2017-07-08 MED ORDER — POLYETHYLENE GLYCOL 3350 17 G PO PACK: 17.0000 g | PACK | Freq: Every day | ORAL | Status: DC | PRN

## 2017-07-08 MED ORDER — OMEPRAZOLE 20 MG PO CPDR: 20.0000 mg | DELAYED_RELEASE_CAPSULE | Freq: Every day | ORAL | 0 refills | Status: DC

## 2017-07-08 NOTE — Discharge Summary (Signed)
Name: Devin [name] MRN: [id number] DOB: 1961/03/27 57 y.o. PCP: Devin Roan, MD  Date of Admission: 07/07/2017  8:30 PM Date of Discharge: 07/09/17 Attending Physician: Dr. Beryle [name]  Discharge Diagnosis: Principal Problem:   Musculoskeletal chest pain   Alcoholic hepatitis without ascites   Active Problems:   GERD with esophagitis   Normocytic anemia     Essential hypertension   Discharge Medications: Allergies as of 07/08/2017   No Known Allergies     Medication List    STOP taking these medications   indomethacin 50 MG capsule Commonly known as:  INDOCIN     TAKE these medications   aspirin EC 81 MG tablet Take 1 tablet (81 mg total) by mouth daily.   atorvastatin 40 MG tablet Commonly known as:  LIPITOR Take 1 tablet (40 mg total) by mouth daily.   dicyclomine 20 MG tablet Commonly known as:  BENTYL Take 1 tablet (20 mg total) by mouth 2 (two) times daily.   metoprolol succinate 100 MG 24 hr tablet Commonly known as:  TOPROL-XL Take 1 tablet (100 mg total) by mouth daily. Take with or immediately following a meal.   nitroGLYCERIN 0.4 MG SL tablet Commonly known as:  NITROSTAT Place 0.4 mg under the tongue every 5 (five) minutes as needed for chest pain.   omeprazole 20 MG capsule Commonly known as:  PRILOSEC Take 1 capsule (20 mg total) by mouth daily.   sertraline 50 MG tablet Commonly known as:  ZOLOFT Take 1 tablet (50 mg total) by mouth daily.   sucralfate 1 g tablet Commonly known as:  CARAFATE Take 1 tablet (1 g total) by mouth 4 (four) times daily -  with meals and at bedtime.       Disposition and follow-up:   [name] was discharged from [facility] in Stable condition.  At the hospital follow up visit please address:  1.  MSK chest pain: please check whether the patient is continuing to have chest pain   Alcoholic hepatitis: the patient should follow with Devin [name] (GI). Please encourage the  patient to avoid alcohol use.   2.  Labs / imaging needed at time of follow-up: cbc to check hemoglobin level  3.  Pending labs/ test needing follow-up: none  Follow-up Appointments: Follow-up Information    Devin Roan, MD. Schedule an appointment as soon as possible for a visit in 2 week(s).   Specialty:  Internal Medicine Contact information: [place] [id number] [phone]        Devin Ada, MD. Schedule an appointment as soon as possible for a visit in 4 week(s).   Specialty:  Gastroenterology Contact information: [place], [place] [address] [place] [facility] Course by problem list:   MSKChest Pain Presented with reproducible chest wall pain. He does have risk factors for CAD but symptoms were not anginal in nature and unlikely to be ACS. His troponins were negative and EKG was unchanged compared to prior. He has a remote history of suspected NSTEM thought secondary to cocaine use. Cath in 2007 showed clean coronaries and nuclear stress test in 2016 was normal.  Alcoholic Hepatitis with associated Reflux Esophagitis He has had extensive workup of his liver disease which ultimately was consistent with alcoholic liver disease. He was ruled out for viral hepatitis, hemochromatosis, and autoimmune hepatitis. Hepatomegaly was noted on exam. Upper endoscopy preformed on 05/11/16  illustrated reflux esophagitis and hiatal herniawith no mention of esophageal varices.RUQ U/S was obtained to rule out acute process which was negative. There was just increased echotexture throughout the liver which was compatible with fatty infiltration or intrinsic liver disease. The patient was given protonix and carafate.   Anemia: Patient reported dark brown stool, but no black stools. He has chronic hemorrhoids associated with intermittent BRBPR. His Hgb was stable at 10.4>10.6 during hospitalization.   Discharge Vitals:   BP (!)  159/110 (BP Location: Left Arm)   Pulse (!) 106   Temp 98.2 F (36.8 C) (Oral)   Resp 17   Ht 5\' 10"  (1.778 m)   Wt 144 lb 9.6 oz (65.6 kg)   SpO2 100%   BMI 20.75 kg/m   Pertinent Labs, Studies, and Procedures:   RUQ Abdominal Ultrasound (07/09/17):  Increased echotexture throughout the liver compatible with fatty infiltration or intrinsic liver disease.  Small anterior gallbladder wall polyp.  No cholelithiasis or acute cholecystitis.  Drugs of Abuse     Component Value Date/Time   LABOPIA NONE DETECTED 07/08/2017 1142   COCAINSCRNUR NONE DETECTED 07/08/2017 1142   COCAINSCRNUR NEG 09/29/2014 1505   LABBENZ NONE DETECTED 07/08/2017 1142   LABBENZ NEG 06/08/2010 1600   AMPHETMU NONE DETECTED 07/08/2017 1142   THCU NONE DETECTED 07/08/2017 1142   LABBARB NONE DETECTED 07/08/2017 1142    CBC Latest Ref Rng & Units 07/08/2017 07/07/2017 06/29/2017  WBC 4.0 - 10.5 K/uL 3.6(L) 3.2(L) 4.5  Hemoglobin 13.0 - 17.0 g/dL 10.6(L) 10.4(L) 13.0  Hematocrit 39.0 - 52.0 % 31.3(L) 30.4(L) 38.1(L)  Platelets 150 - 400 K/uL 184 176 255   BMP Latest Ref Rng & Units 07/09/2017 07/08/2017 07/07/2017  Glucose 65 - 99 mg/dL 113(H) 75 81  BUN 6 - 20 mg/dL 8 13 15   Creatinine 0.61 - 1.24 mg/dL 0.93 0.94 1.08  BUN/Creat Ratio 9 - 20 - - -  Sodium 135 - 145 mmol/L 135 136 138  Potassium 3.5 - 5.1 mmol/L 3.5 3.8 3.6  Chloride 101 - 111 mmol/L 100(L) 107 108  CO2 22 - 32 mmol/L 23 17(L) 19(L)  Calcium 8.9 - 10.3 mg/dL 9.1 7.7(L) 7.6(L)   Ethanol=274  Discharge Instructions: Discharge Instructions    Call MD for:  difficulty breathing, headache or visual disturbances   Complete by:  As directed    Call MD for:  persistant dizziness or light-headedness   Complete by:  As directed    Call MD for:  persistant nausea and vomiting   Complete by:  As directed    Call MD for:  temperature >100.4   Complete by:  As directed    Diet - low sodium heart healthy   Complete by:  As directed     Discharge instructions   Complete by:  As directed    Assess Alcohol use and progress towards cessation.   Increase activity slowly   Complete by:  As directed       Signed: Lars Mage, MD Internal Medicine PGY1 Pager:[phone]

## 2017-07-08 NOTE — Progress Notes (Signed)
   Subjective:  He is feeling a little better. His chest wall pain is improving. He reports occasional lightheadedness without syncope. He reports chronic hemorrhoids with occasional BRBPR. He says his stools are dark brown and denies black colored stool. He says he suffers from chronic back pain. He reports some nausea without vomiting. He says he drinks heavy maybe twice a week which includes two shots of liquor and one 40 oz of beer. Last drink was yesterday. He denies any history of withdrawal.  Objective:  Vital signs in last 24 hours: Vitals:   07/08/17 0630 07/08/17 0700 07/08/17 0745 07/08/17 0829  BP:    (!) 159/110  Pulse: (!) 101 95 97 (!) 106  Resp: 16 16 15 17   Temp:    98.2 F (36.8 C)  TempSrc:    Oral  SpO2: 99% 99% 99% 100%  Weight:    144 lb 9.6 oz (65.6 kg)  Height:    5\' 10"  (1.778 m)   General: resting in bed, no acute distress Chest: Chest wall tender to palpation across the chest Cardiac: RRR, no rubs, murmurs or gallops Pulm: clear to auscultation bilaterally, moving normal volumes of air Abd: soft, tender to light palpation, nondistended, BS present Ext: warm and well perfused, no pedal edema Neuro: alert and oriented X3  Assessment/Plan:  Principal Problem:   Musculoskeletal chest pain Active Problems:   GERD with esophagitis   Normocytic anemia   Alcoholic hepatitis without ascites   Essential hypertension  Devin [name] is a 57 yo male with HTN, HLD, alcoholic liver disease and NSTEMI 2/2 substance abuse who presented to the ED with acute onset chest pain and alcohol intoxication.   MSK Chest Pain He does have risk factors for CAD but this episode is unlikely to be ACS. His troponin x 2 have been negative and EKG is unchanged compared to prior. His chest pain is reproducible with palpation of the chest wall.  - Continue to monitor. No further troponin trend as this is unlikely cardiac - Voltaren gel  Alcoholic Hepatitis with associated Reflux  Esophagitis Patient reports dark brown stool, but no black stools. He reports chronic hemorrhoids associated with intermittent BRBPR. He has had extensive workup of his liver disease which ultimately suggests alcoholic liver disease. Upper endoscopy preformed on 05/11/16 that illustrated reflux esophagitis and hiatal hernia with no mention of esophageal varices. His Hgb is 10.4 -> 10.6 (baseline 11-12).  - Will obtain RUQ U/S to rule out acute process, if negative can likely be discharged - Follow Hgb - doubt significant active bleed as Hgb near baseline - CIWA checks q12h  HTN - Continue metoprolol 100 mg QD  Hx of NSTEMI thought 2/2 to Cocaine Use Clean coronaries on cath in 2007, nuclear stress test in 2016 was normal. - Continue metoprolol, atorvastatin, and ASA  Dispo: Anticipated discharge in approximately 0-1 day(s).   Zada Finders, MD  Internal Medicine PGY-3 07/08/2017, 11:41 AM

## 2017-07-08 NOTE — ED Notes (Signed)
Admitting Provider at bedside. 

## 2017-07-08 NOTE — Discharge Instructions (Signed)
Please try your best to discontinue alcohol use as this is causing damage to your liver. Please follow up with Dr. Shan Levans in about 2 weeks.
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
## 2017-07-08 NOTE — Progress Notes (Signed)
Pt was sleeping and IV came out. Told pt I will reinsert another one and he wants to wait until he actually need one inserted. Advised pt that we have to always have vascular access in case an event arises. Pt still wants to wait for IV to be inserted.

## 2017-07-09 ENCOUNTER — Observation Stay (HOSPITAL_COMMUNITY): Payer: Medicaid Other

## 2017-07-09 DIAGNOSIS — K7010 Alcoholic hepatitis without ascites: Secondary | ICD-10-CM | POA: Diagnosis not present

## 2017-07-09 DIAGNOSIS — K449 Diaphragmatic hernia without obstruction or gangrene: Secondary | ICD-10-CM | POA: Diagnosis not present

## 2017-07-09 DIAGNOSIS — I252 Old myocardial infarction: Secondary | ICD-10-CM

## 2017-07-09 DIAGNOSIS — E785 Hyperlipidemia, unspecified: Secondary | ICD-10-CM | POA: Diagnosis not present

## 2017-07-09 DIAGNOSIS — K824 Cholesterolosis of gallbladder: Secondary | ICD-10-CM | POA: Diagnosis not present

## 2017-07-09 DIAGNOSIS — D500 Iron deficiency anemia secondary to blood loss (chronic): Secondary | ICD-10-CM

## 2017-07-09 DIAGNOSIS — K210 Gastro-esophageal reflux disease with esophagitis: Secondary | ICD-10-CM | POA: Diagnosis not present

## 2017-07-09 DIAGNOSIS — K649 Unspecified hemorrhoids: Secondary | ICD-10-CM | POA: Diagnosis not present

## 2017-07-09 DIAGNOSIS — F1411 Cocaine abuse, in remission: Secondary | ICD-10-CM

## 2017-07-09 DIAGNOSIS — I10 Essential (primary) hypertension: Secondary | ICD-10-CM | POA: Diagnosis not present

## 2017-07-09 DIAGNOSIS — F10129 Alcohol abuse with intoxication, unspecified: Secondary | ICD-10-CM | POA: Diagnosis not present

## 2017-07-09 DIAGNOSIS — R0789 Other chest pain: Secondary | ICD-10-CM | POA: Diagnosis not present

## 2017-07-09 DIAGNOSIS — R160 Hepatomegaly, not elsewhere classified: Secondary | ICD-10-CM | POA: Diagnosis not present

## 2017-07-09 LAB — COMPREHENSIVE METABOLIC PANEL
ALBUMIN: 3.2 g/dL — AB (ref 3.5–5.0)
ALT: 94 U/L — ABNORMAL HIGH (ref 17–63)
AST: 240 U/L — AB (ref 15–41)
Alkaline Phosphatase: 299 U/L — ABNORMAL HIGH (ref 38–126)
Anion gap: 12 (ref 5–15)
BUN: 8 mg/dL (ref 6–20)
CHLORIDE: 100 mmol/L — AB (ref 101–111)
CO2: 23 mmol/L (ref 22–32)
Calcium: 9.1 mg/dL (ref 8.9–10.3)
Creatinine, Ser: 0.93 mg/dL (ref 0.61–1.24)
GFR calc Af Amer: >60 mL/min (ref >60)
GFR calc non Af Amer: >60 mL/min (ref >60)
GLUCOSE: 113 mg/dL — AB (ref 65–99)
POTASSIUM: 3.5 mmol/L (ref 3.5–5.1)
Sodium: 135 mmol/L (ref 135–145)
Total Bilirubin: 1.9 mg/dL — ABNORMAL HIGH (ref 0.3–1.2)
Total Protein: 7.3 g/dL (ref 6.5–8.1)

## 2017-07-09 NOTE — Progress Notes (Signed)
Medicine attending discharge note: I personally examined this patient on the day of discharge and I attest to the accuracy of the evaluation and management plan as recorded in the final progress note by resident physician Dr. Lars Mage.  57 year old man admitted with atypical chest pain.  He states that he did have a heart attack back in 2003 treated at [facility].  On exam, he was tender over the right pectoral area.  He ruled out for a myocardial infarction.  He has chronic liver function abnormalities.  He had an extensive evaluation during a November 2018 admission including studies to rule out autoimmune hepatitis, viral hepatitis, and hemochromatosis.  Liver and pancreas appeared normal on CT scan without any focal abnormalities.  Final evaluation was that his liver function abnormalities are alcohol related. On my exam, his liver is enlarged approximately 10 cm below the right costal margin and extends across the midline.  Tender smooth edge.  He is also tender in the epigastric region.  He did have some residual right chest wall tenderness as well.  He reports no recent exercise or trauma.  We will treat the chest wall tenderness with topical nonsteroidals.  The epigastric tenderness with Protonix and Carafate.  Disposition: Condition stable at time of discharge Follow-up in our internal medicine clinic There were no complications

## 2017-07-09 NOTE — Progress Notes (Signed)
Made oncall MD aware of patient blood pressure 145/104, no new orders given. Will continue to monitor.

## 2017-07-09 NOTE — Progress Notes (Signed)
Patient was discharged home by MD order; discharged instructions  review and give to patient with care notes; IV DIC; skin intact; social worker provided bus pass; patient will be escorted to the car by nurse tech via wheelchair.

## 2017-07-09 NOTE — Plan of Care (Signed)
Patient has abdominal pain with nausea. BP elevated.

## 2017-07-09 NOTE — Progress Notes (Signed)
   Subjective: Devin [name] was seen laying in his bed this morning. He stated that he is still having some chest pain and sob. The chest pain is worsened when palpating chest wall.   Objective:  Vital signs in last 24 hours: Vitals:   07/09/17 0030 07/09/17 0534 07/09/17 0536 07/09/17 1017  BP: (!) 145/104 (!) 145/110 (!) 164/107 (!) 138/101  Pulse:  84  89  Resp:  17  18  Temp:  98.4 F (36.9 C)    TempSrc:  Oral    SpO2:  100%  100%  Weight:      Height:       Physical Exam  Constitutional: He appears well-developed and well-nourished. No distress.  HENT:  Head: Normocephalic and atraumatic.  Eyes: Conjunctivae are normal.  Cardiovascular: Normal rate, regular rhythm and normal heart sounds.  Respiratory: Effort normal and breath sounds normal. No respiratory distress. He has no wheezes. He exhibits tenderness.  GI: Soft. Bowel sounds are normal. He exhibits no distension. There is tenderness (epigastric and left upper quadrant).  Protuberant abdomen on appearance hepatomegaly  Musculoskeletal: He exhibits no edema.  Neurological: He is alert.  Skin: He is not diaphoretic.  Psychiatric: He has a normal mood and affect. His behavior is normal. Judgment and thought content normal.    Assessment/Plan:  Devin [name] is a 57 yo male with HTN, HLD, alcoholic liver disease and history of NSTEMI 2/2 substance abuse who presented to the ED with acute onset chest pain and alcohol intoxication.  Atypical Chest Pain The patient's chest pain is thought to be due to GI or musculoskeletal etiology. The patient has chest wall tenderness to palpation which makes musculoskeletal etiology more likely. Troponin was negative and there were no st or t wave changes on ekg to suggest ACS.  -Continue to monitor. No further troponin trend as this is unlikely cardiac -Voltaren gel -Carafate -Continue metoprolol, atorvastatin, and ASA due to history of NSTEMI -Protonix 40mg  qd  Alcoholic  Hepatitis with associated Reflux Esophagitis On exam, the patient was seen to have a protruberant abdomen and hepatomegaly. RUQ ultrasound done today showed increased echotecture, no cholelitiasis, or acute cholecystitis. CT abdomen from Nov 2018 showed left greater than right pleural effusions, mild mesenteric edema, mild pericholecystic fluid, no cholelithiasis or biliary dilatation.   Anti-mitochondrial and anti-smooth muscle antibodies were negative in July 2018. Review of cmp showed ast/alt>2:1. Ast=240,alt=94, alp=299, tbili=1.9 all of which are consistent to findings of alcoholic hepatitis. The patient's maddrey discrimination function was 29 points which shows good prognosis. The patient does not need glucocorticoid therapy at this time.   Upper endoscopy (05/11/16) did not show any esophageal varices.   -sertraline 50mg  qd -thiamine, folic acid  HTN The patient's blood pressure over the past 24 hrs has been ranging 140-164/101-107.  - Continue metoprolol 100 mg QD   Dispo: Anticipated discharge in approximately 0-1 day(s).   Lars Mage, MD 07/09/2017, 2:11 PM Pager: [phone]

## 2017-07-16 ENCOUNTER — Ambulatory Visit: Payer: Medicaid Other

## 2017-07-17 ENCOUNTER — Encounter: Payer: Self-pay | Admitting: Internal Medicine

## 2017-07-24 ENCOUNTER — Other Ambulatory Visit: Payer: Self-pay

## 2017-07-24 ENCOUNTER — Encounter (HOSPITAL_COMMUNITY): Payer: Self-pay | Admitting: *Deleted

## 2017-07-24 DIAGNOSIS — E440 Moderate protein-calorie malnutrition: Secondary | ICD-10-CM | POA: Diagnosis present

## 2017-07-24 DIAGNOSIS — Z590 Homelessness: Secondary | ICD-10-CM

## 2017-07-24 DIAGNOSIS — F17210 Nicotine dependence, cigarettes, uncomplicated: Secondary | ICD-10-CM | POA: Diagnosis present

## 2017-07-24 DIAGNOSIS — E785 Hyperlipidemia, unspecified: Secondary | ICD-10-CM | POA: Diagnosis present

## 2017-07-24 DIAGNOSIS — F1290 Cannabis use, unspecified, uncomplicated: Secondary | ICD-10-CM | POA: Diagnosis present

## 2017-07-24 DIAGNOSIS — K644 Residual hemorrhoidal skin tags: Secondary | ICD-10-CM | POA: Diagnosis present

## 2017-07-24 DIAGNOSIS — D123 Benign neoplasm of transverse colon: Secondary | ICD-10-CM | POA: Diagnosis present

## 2017-07-24 DIAGNOSIS — K921 Melena: Secondary | ICD-10-CM | POA: Diagnosis present

## 2017-07-24 DIAGNOSIS — K760 Fatty (change of) liver, not elsewhere classified: Secondary | ICD-10-CM | POA: Diagnosis present

## 2017-07-24 DIAGNOSIS — F1010 Alcohol abuse, uncomplicated: Secondary | ICD-10-CM | POA: Diagnosis present

## 2017-07-24 DIAGNOSIS — M549 Dorsalgia, unspecified: Secondary | ICD-10-CM | POA: Diagnosis present

## 2017-07-24 DIAGNOSIS — I2510 Atherosclerotic heart disease of native coronary artery without angina pectoris: Secondary | ICD-10-CM | POA: Diagnosis present

## 2017-07-24 DIAGNOSIS — I252 Old myocardial infarction: Secondary | ICD-10-CM

## 2017-07-24 DIAGNOSIS — R0789 Other chest pain: Secondary | ICD-10-CM | POA: Diagnosis present

## 2017-07-24 DIAGNOSIS — K210 Gastro-esophageal reflux disease with esophagitis: Secondary | ICD-10-CM | POA: Diagnosis present

## 2017-07-24 DIAGNOSIS — I10 Essential (primary) hypertension: Secondary | ICD-10-CM | POA: Diagnosis present

## 2017-07-24 DIAGNOSIS — G8929 Other chronic pain: Secondary | ICD-10-CM | POA: Diagnosis present

## 2017-07-24 DIAGNOSIS — Z7982 Long term (current) use of aspirin: Secondary | ICD-10-CM

## 2017-07-24 DIAGNOSIS — E861 Hypovolemia: Secondary | ICD-10-CM | POA: Diagnosis present

## 2017-07-24 DIAGNOSIS — M109 Gout, unspecified: Secondary | ICD-10-CM | POA: Diagnosis present

## 2017-07-24 DIAGNOSIS — K648 Other hemorrhoids: Secondary | ICD-10-CM | POA: Diagnosis present

## 2017-07-24 DIAGNOSIS — F329 Major depressive disorder, single episode, unspecified: Secondary | ICD-10-CM | POA: Diagnosis present

## 2017-07-24 DIAGNOSIS — Z79899 Other long term (current) drug therapy: Secondary | ICD-10-CM

## 2017-07-24 DIAGNOSIS — K7010 Alcoholic hepatitis without ascites: Principal | ICD-10-CM | POA: Diagnosis present

## 2017-07-24 DIAGNOSIS — K2980 Duodenitis without bleeding: Secondary | ICD-10-CM | POA: Diagnosis present

## 2017-07-24 DIAGNOSIS — K5710 Diverticulosis of small intestine without perforation or abscess without bleeding: Secondary | ICD-10-CM | POA: Diagnosis present

## 2017-07-24 DIAGNOSIS — D122 Benign neoplasm of ascending colon: Secondary | ICD-10-CM | POA: Diagnosis present

## 2017-07-24 DIAGNOSIS — E871 Hypo-osmolality and hyponatremia: Secondary | ICD-10-CM | POA: Diagnosis present

## 2017-07-24 DIAGNOSIS — Z6820 Body mass index (BMI) 20.0-20.9, adult: Secondary | ICD-10-CM

## 2017-07-24 DIAGNOSIS — E872 Acidosis: Secondary | ICD-10-CM | POA: Diagnosis present

## 2017-07-24 NOTE — ED Triage Notes (Signed)
Pt c/o abd and chest pain since yesterday with diarrhea.

## 2017-07-25 ENCOUNTER — Other Ambulatory Visit: Payer: Self-pay

## 2017-07-25 ENCOUNTER — Encounter (HOSPITAL_COMMUNITY): Payer: Self-pay | Admitting: Internal Medicine

## 2017-07-25 ENCOUNTER — Inpatient Hospital Stay (HOSPITAL_COMMUNITY)
Admission: EM | Admit: 2017-07-25 | Discharge: 2017-07-28 | DRG: 433 | Disposition: A | Discharge status: Home / Self Care | Payer: Medicaid Other | Attending: Internal Medicine | Admitting: Internal Medicine

## 2017-07-25 ENCOUNTER — Emergency Department (HOSPITAL_COMMUNITY): Payer: Medicaid Other

## 2017-07-25 DIAGNOSIS — K644 Residual hemorrhoidal skin tags: Secondary | ICD-10-CM | POA: Diagnosis present

## 2017-07-25 DIAGNOSIS — E871 Hypo-osmolality and hyponatremia: Secondary | ICD-10-CM | POA: Diagnosis present

## 2017-07-25 DIAGNOSIS — K5710 Diverticulosis of small intestine without perforation or abscess without bleeding: Secondary | ICD-10-CM | POA: Diagnosis present

## 2017-07-25 DIAGNOSIS — K2100 Gastro-esophageal reflux disease with esophagitis, without bleeding: Secondary | ICD-10-CM | POA: Diagnosis present

## 2017-07-25 DIAGNOSIS — R740 Nonspecific elevation of levels of transaminase and lactic acid dehydrogenase [LDH]: Secondary | ICD-10-CM | POA: Diagnosis not present

## 2017-07-25 DIAGNOSIS — K760 Fatty (change of) liver, not elsewhere classified: Secondary | ICD-10-CM | POA: Diagnosis present

## 2017-07-25 DIAGNOSIS — F1010 Alcohol abuse, uncomplicated: Secondary | ICD-10-CM | POA: Diagnosis present

## 2017-07-25 DIAGNOSIS — E785 Hyperlipidemia, unspecified: Secondary | ICD-10-CM

## 2017-07-25 DIAGNOSIS — K7010 Alcoholic hepatitis without ascites: Secondary | ICD-10-CM | POA: Diagnosis present

## 2017-07-25 DIAGNOSIS — M109 Gout, unspecified: Secondary | ICD-10-CM | POA: Diagnosis present

## 2017-07-25 DIAGNOSIS — E861 Hypovolemia: Secondary | ICD-10-CM | POA: Diagnosis present

## 2017-07-25 DIAGNOSIS — F17210 Nicotine dependence, cigarettes, uncomplicated: Secondary | ICD-10-CM

## 2017-07-25 DIAGNOSIS — D649 Anemia, unspecified: Secondary | ICD-10-CM | POA: Diagnosis not present

## 2017-07-25 DIAGNOSIS — M549 Dorsalgia, unspecified: Secondary | ICD-10-CM | POA: Diagnosis present

## 2017-07-25 DIAGNOSIS — F32A Depression, unspecified: Secondary | ICD-10-CM | POA: Diagnosis present

## 2017-07-25 DIAGNOSIS — I252 Old myocardial infarction: Secondary | ICD-10-CM | POA: Diagnosis not present

## 2017-07-25 DIAGNOSIS — Z590 Homelessness: Secondary | ICD-10-CM

## 2017-07-25 DIAGNOSIS — E872 Acidosis: Secondary | ICD-10-CM | POA: Diagnosis present

## 2017-07-25 DIAGNOSIS — Y902 Blood alcohol level of 40-59 mg/100 ml: Secondary | ICD-10-CM | POA: Diagnosis not present

## 2017-07-25 DIAGNOSIS — I10 Essential (primary) hypertension: Secondary | ICD-10-CM

## 2017-07-25 DIAGNOSIS — F339 Major depressive disorder, recurrent, unspecified: Secondary | ICD-10-CM | POA: Diagnosis not present

## 2017-07-25 DIAGNOSIS — K209 Esophagitis, unspecified: Secondary | ICD-10-CM | POA: Diagnosis not present

## 2017-07-25 DIAGNOSIS — K635 Polyp of colon: Secondary | ICD-10-CM | POA: Diagnosis not present

## 2017-07-25 DIAGNOSIS — K921 Melena: Secondary | ICD-10-CM | POA: Diagnosis present

## 2017-07-25 DIAGNOSIS — D122 Benign neoplasm of ascending colon: Secondary | ICD-10-CM | POA: Diagnosis present

## 2017-07-25 DIAGNOSIS — K648 Other hemorrhoids: Secondary | ICD-10-CM | POA: Diagnosis not present

## 2017-07-25 DIAGNOSIS — K824 Cholesterolosis of gallbladder: Secondary | ICD-10-CM

## 2017-07-25 DIAGNOSIS — F329 Major depressive disorder, single episode, unspecified: Secondary | ICD-10-CM | POA: Diagnosis present

## 2017-07-25 DIAGNOSIS — K2980 Duodenitis without bleeding: Secondary | ICD-10-CM | POA: Diagnosis present

## 2017-07-25 DIAGNOSIS — G8929 Other chronic pain: Secondary | ICD-10-CM | POA: Diagnosis present

## 2017-07-25 DIAGNOSIS — E8729 Other acidosis: Secondary | ICD-10-CM

## 2017-07-25 DIAGNOSIS — R0789 Other chest pain: Secondary | ICD-10-CM | POA: Diagnosis present

## 2017-07-25 DIAGNOSIS — E440 Moderate protein-calorie malnutrition: Secondary | ICD-10-CM | POA: Diagnosis present

## 2017-07-25 DIAGNOSIS — K649 Unspecified hemorrhoids: Secondary | ICD-10-CM | POA: Diagnosis not present

## 2017-07-25 DIAGNOSIS — I2510 Atherosclerotic heart disease of native coronary artery without angina pectoris: Secondary | ICD-10-CM | POA: Diagnosis present

## 2017-07-25 DIAGNOSIS — F1099 Alcohol use, unspecified with unspecified alcohol-induced disorder: Secondary | ICD-10-CM

## 2017-07-25 DIAGNOSIS — Z79899 Other long term (current) drug therapy: Secondary | ICD-10-CM

## 2017-07-25 DIAGNOSIS — F1290 Cannabis use, unspecified, uncomplicated: Secondary | ICD-10-CM | POA: Diagnosis present

## 2017-07-25 LAB — COMPREHENSIVE METABOLIC PANEL
ALK PHOS: 361 U/L — AB (ref 38–126)
ALT: 150 U/L — ABNORMAL HIGH (ref 17–63)
ALT: 207 U/L — AB (ref 17–63)
ANION GAP: 14 (ref 5–15)
AST: 491 U/L — ABNORMAL HIGH (ref 15–41)
AST: 363 U/L — ABNORMAL HIGH (ref 15–41)
Albumin: 3.8 g/dL (ref 3.5–5.0)
Albumin: 2.9 g/dL — ABNORMAL LOW (ref 3.5–5.0)
Alkaline Phosphatase: 493 U/L — ABNORMAL HIGH (ref 38–126)
Anion gap: 17 — ABNORMAL HIGH (ref 5–15)
BILIRUBIN TOTAL: 4.9 mg/dL — AB (ref 0.3–1.2)
BUN: 10 mg/dL (ref 6–20)
BUN: 7 mg/dL (ref 6–20)
CALCIUM: 8.2 mg/dL — AB (ref 8.9–10.3)
CHLORIDE: 90 mmol/L — AB (ref 101–111)
CO2: 14 mmol/L — AB (ref 22–32)
CO2: 16 mmol/L — ABNORMAL LOW (ref 22–32)
CREATININE: 0.69 mg/dL (ref 0.61–1.24)
Calcium: 9.2 mg/dL (ref 8.9–10.3)
Chloride: 93 mmol/L — ABNORMAL LOW (ref 101–111)
Creatinine, Ser: 0.7 mg/dL (ref 0.61–1.24)
GFR calc Af Amer: >60 mL/min (ref >60)
GFR calc non Af Amer: >60 mL/min (ref >60)
GFR calc non Af Amer: >60 mL/min (ref >60)
Glucose, Bld: 98 mg/dL (ref 65–99)
Glucose, Bld: 88 mg/dL (ref 65–99)
Potassium: 5.4 mmol/L — ABNORMAL HIGH (ref 3.5–5.1)
Potassium: 5.2 mmol/L — ABNORMAL HIGH (ref 3.5–5.1)
SODIUM: 121 mmol/L — AB (ref 135–145)
SODIUM: 123 mmol/L — AB (ref 135–145)
TOTAL PROTEIN: 6.6 g/dL (ref 6.5–8.1)
Total Bilirubin: 5.7 mg/dL — ABNORMAL HIGH (ref 0.3–1.2)

## 2017-07-25 LAB — BASIC METABOLIC PANEL
Anion gap: 11 (ref 5–15)
BUN: 6 mg/dL (ref 6–20)
CO2: 19 mmol/L — ABNORMAL LOW (ref 22–32)
Calcium: 8.5 mg/dL — ABNORMAL LOW (ref 8.9–10.3)
Chloride: 95 mmol/L — ABNORMAL LOW (ref 101–111)
Creatinine, Ser: 0.92 mg/dL (ref 0.61–1.24)
GFR calc Af Amer: >60 mL/min (ref >60)
GFR calc non Af Amer: >60 mL/min (ref >60)
Glucose, Bld: 138 mg/dL — ABNORMAL HIGH (ref 65–99)
Potassium: 4.4 mmol/L (ref 3.5–5.1)
Sodium: 125 mmol/L — ABNORMAL LOW (ref 135–145)

## 2017-07-25 LAB — URINALYSIS, ROUTINE W REFLEX MICROSCOPIC
Bilirubin Urine: NEGATIVE
GLUCOSE, UA: NEGATIVE mg/dL
Hgb urine dipstick: NEGATIVE
Ketones, ur: NEGATIVE mg/dL
Leukocytes, UA: NEGATIVE
Nitrite: NEGATIVE
PROTEIN: NEGATIVE mg/dL
Specific Gravity, Urine: 1.006 (ref 1.005–1.030)
pH: 5 (ref 5.0–8.0)

## 2017-07-25 LAB — PROTIME-INR
INR: 1.29
PROTHROMBIN TIME: 16 s — AB (ref 11.4–15.2)

## 2017-07-25 LAB — CBC
HCT: 37.2 % — ABNORMAL LOW (ref 39.0–52.0)
Hemoglobin: 12.9 g/dL — ABNORMAL LOW (ref 13.0–17.0)
MCH: 33 pg (ref 26.0–34.0)
MCHC: 34.7 g/dL (ref 30.0–36.0)
MCV: 95.1 fL (ref 78.0–100.0)
PLATELETS: 281 10*3/uL (ref 150–400)
RBC: 3.91 MIL/uL — AB (ref 4.22–5.81)
RDW: 16.5 % — AB (ref 11.5–15.5)
WBC: 5.2 10*3/uL (ref 4.0–10.5)

## 2017-07-25 LAB — I-STAT VENOUS BLOOD GAS, ED
Acid-base deficit: 4 mmol/L — ABNORMAL HIGH (ref 0.0–2.0)
Bicarbonate: 20 mmol/L (ref 20.0–28.0)
O2 SAT: 86 %
PO2 VEN: 52 mmHg — AB (ref 32.0–45.0)
TCO2: 21 mmol/L — AB (ref 22–32)
pCO2, Ven: 34.5 mmHg — ABNORMAL LOW (ref 44.0–60.0)
pH, Ven: 7.371 (ref 7.250–7.430)

## 2017-07-25 LAB — RAPID URINE DRUG SCREEN, HOSP PERFORMED
Amphetamines: NOT DETECTED
Barbiturates: NOT DETECTED
Benzodiazepines: NOT DETECTED
Cocaine: NOT DETECTED
Opiates: NOT DETECTED
Tetrahydrocannabinol: NOT DETECTED

## 2017-07-25 LAB — LACTIC ACID, PLASMA
Lactic Acid, Venous: 2.8 mmol/L (ref 0.5–1.9)
Lactic Acid, Venous: 2 mmol/L (ref 0.5–1.9)
Lactic Acid, Venous: 3 mmol/L (ref 0.5–1.9)

## 2017-07-25 LAB — LIPASE, BLOOD: LIPASE: 39 U/L (ref 11–51)

## 2017-07-25 LAB — I-STAT CG4 LACTIC ACID, ED: LACTIC ACID, VENOUS: 3.39 mmol/L — AB (ref 0.5–1.9)

## 2017-07-25 LAB — APTT: aPTT: 36 seconds (ref 24–36)

## 2017-07-25 LAB — C DIFFICILE QUICK SCREEN W PCR REFLEX
C DIFFICILE (CDIFF) TOXIN: NEGATIVE
C Diff antigen: NEGATIVE
C Diff interpretation: NOT DETECTED

## 2017-07-25 LAB — TROPONIN I
Troponin I: <0.03 ng/mL (ref <0.03)
Troponin I: <0.03 ng/mL (ref <0.03)

## 2017-07-25 LAB — MAGNESIUM: Magnesium: 1.9 mg/dL (ref 1.7–2.4)

## 2017-07-25 LAB — ETHANOL: Alcohol, Ethyl (B): 59 mg/dL — ABNORMAL HIGH (ref <10)

## 2017-07-25 MED ORDER — SERTRALINE HCL 50 MG PO TABS
50.0000 mg | ORAL_TABLET | Freq: Every day | ORAL | Status: DC
  Administered 2017-07-25 – 2017-07-28 (×4): 50 mg via ORAL
  Filled 2017-07-25 (×5): qty 1

## 2017-07-25 MED ORDER — METOPROLOL SUCCINATE ER 100 MG PO TB24: 100.0000 mg | ORAL_TABLET | Freq: Every day | ORAL | Status: DC

## 2017-07-25 MED ORDER — DICLOFENAC SODIUM 1 % TD GEL
4.0000 g | Freq: Four times a day (QID) | TRANSDERMAL | Status: DC | PRN
  Administered 2017-07-25: 4 g via TOPICAL
  Filled 2017-07-25: qty 100

## 2017-07-25 MED ORDER — PANTOPRAZOLE SODIUM 40 MG PO TBEC
40.0000 mg | DELAYED_RELEASE_TABLET | Freq: Every day | ORAL | Status: DC
  Administered 2017-07-26 – 2017-07-28 (×3): 40 mg via ORAL
  Filled 2017-07-25 (×3): qty 1

## 2017-07-25 MED ORDER — RAMELTEON 8 MG PO TABS
8.0000 mg | ORAL_TABLET | Freq: Every day | ORAL | Status: DC
  Administered 2017-07-25 – 2017-07-28 (×3): 8 mg via ORAL
  Filled 2017-07-25 (×3): qty 1

## 2017-07-25 MED ORDER — SODIUM CHLORIDE 0.9 % IV SOLN
INTRAVENOUS | Status: AC
  Administered 2017-07-25: 12:00:00 via INTRAVENOUS

## 2017-07-25 MED ORDER — METOPROLOL SUCCINATE ER 100 MG PO TB24
100.0000 mg | ORAL_TABLET | Freq: Every day | ORAL | Status: DC
  Administered 2017-07-25 – 2017-07-28 (×4): 100 mg via ORAL
  Filled 2017-07-25 (×4): qty 1

## 2017-07-25 MED ORDER — PANTOPRAZOLE SODIUM 40 MG PO TBEC: 40.0000 mg | DELAYED_RELEASE_TABLET | Freq: Every day | ORAL | Status: DC

## 2017-07-25 MED ORDER — ONDANSETRON HCL 4 MG PO TABS: 4.0000 mg | ORAL_TABLET | Freq: Four times a day (QID) | ORAL | Status: DC | PRN

## 2017-07-25 MED ORDER — ONDANSETRON HCL 4 MG/2ML IJ SOLN: 4.0000 mg | Freq: Four times a day (QID) | INTRAMUSCULAR | Status: DC | PRN

## 2017-07-25 MED ORDER — PANTOPRAZOLE SODIUM 40 MG IV SOLR
40.0000 mg | Freq: Once | INTRAVENOUS | Status: AC
  Administered 2017-07-25: 40 mg via INTRAVENOUS
  Filled 2017-07-25: qty 40

## 2017-07-25 MED ORDER — SODIUM CHLORIDE 0.9 % IV BOLUS
500.0000 mL | Freq: Once | INTRAVENOUS | Status: AC
  Administered 2017-07-25: 500 mL via INTRAVENOUS

## 2017-07-25 MED ORDER — SUCRALFATE 1 G PO TABS
1.0000 g | ORAL_TABLET | Freq: Once | ORAL | Status: AC
  Administered 2017-07-25: 1 g via ORAL
  Filled 2017-07-25: qty 1

## 2017-07-25 MED ORDER — SIMETHICONE 80 MG PO CHEW: 80.0000 mg | CHEWABLE_TABLET | Freq: Four times a day (QID) | ORAL | Status: DC | PRN

## 2017-07-25 MED ORDER — ONDANSETRON HCL 4 MG/2ML IJ SOLN
4.0000 mg | Freq: Once | INTRAMUSCULAR | Status: AC
  Administered 2017-07-25: 4 mg via INTRAVENOUS
  Filled 2017-07-25: qty 2

## 2017-07-25 MED ORDER — FENTANYL CITRATE (PF) 100 MCG/2ML IJ SOLN
50.0000 ug | Freq: Once | INTRAMUSCULAR | Status: AC
  Administered 2017-07-25: 50 ug via INTRAVENOUS
  Filled 2017-07-25: qty 2

## 2017-07-25 MED ORDER — ENOXAPARIN SODIUM 40 MG/0.4ML ~~LOC~~ SOLN
40.0000 mg | SUBCUTANEOUS | Status: DC
  Administered 2017-07-25 – 2017-07-28 (×3): 40 mg via SUBCUTANEOUS
  Filled 2017-07-25 (×3): qty 0.4

## 2017-07-25 MED ORDER — IOPAMIDOL (ISOVUE-370) INJECTION 76%
INTRAVENOUS | Status: AC
  Administered 2017-07-25: 100 mL
  Filled 2017-07-25: qty 100

## 2017-07-25 MED ORDER — SODIUM CHLORIDE 0.9 % IV BOLUS
1000.0000 mL | Freq: Once | INTRAVENOUS | Status: AC
  Administered 2017-07-25: 1000 mL via INTRAVENOUS
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
## 2017-07-25 NOTE — Progress Notes (Signed)
MD aware that patient is hypertensive, no new orders placed, will continue to monitor

## 2017-07-25 NOTE — Progress Notes (Signed)
Went by to see pt as his lactate increased.  Pt in no distress, states abdominal pain feeling better this evening.  Review of CT scan reassuring, pain improvement and physical exam reassuring.  Will give pt another bolus of fluid as his urine is still relatively dark in color and he is handling the IV fluid well.  Continue monitoring for now.
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
## 2017-07-25 NOTE — ED Provider Notes (Signed)
Dr. Alessandra Bevels returned page from [facility] gastroenterology.  He is consulted regarding the patient's liver failure and elevated bilirubin.  He will see patient in consultation.   Pattricia Boss, MD 07/25/17 [phone]

## 2017-07-25 NOTE — ED Notes (Signed)
Patient transported to CT 

## 2017-07-26 ENCOUNTER — Encounter (HOSPITAL_COMMUNITY): Payer: Self-pay | Admitting: *Deleted

## 2017-07-26 DIAGNOSIS — K209 Esophagitis, unspecified: Secondary | ICD-10-CM

## 2017-07-26 DIAGNOSIS — Y902 Blood alcohol level of 40-59 mg/100 ml: Secondary | ICD-10-CM

## 2017-07-26 DIAGNOSIS — R740 Nonspecific elevation of levels of transaminase and lactic acid dehydrogenase [LDH]: Secondary | ICD-10-CM

## 2017-07-26 DIAGNOSIS — E440 Moderate protein-calorie malnutrition: Secondary | ICD-10-CM | POA: Diagnosis present

## 2017-07-26 DIAGNOSIS — D649 Anemia, unspecified: Secondary | ICD-10-CM

## 2017-07-26 DIAGNOSIS — F339 Major depressive disorder, recurrent, unspecified: Secondary | ICD-10-CM

## 2017-07-26 LAB — GASTROINTESTINAL PANEL BY PCR, STOOL (REPLACES STOOL CULTURE)

## 2017-07-26 LAB — COMPREHENSIVE METABOLIC PANEL
ALT: 132 U/L — ABNORMAL HIGH (ref 17–63)
AST: 323 U/L — ABNORMAL HIGH (ref 15–41)
Albumin: 2.8 g/dL — ABNORMAL LOW (ref 3.5–5.0)
Alkaline Phosphatase: 440 U/L — ABNORMAL HIGH (ref 38–126)
Anion gap: 12 (ref 5–15)
BUN: 9 mg/dL (ref 6–20)
CO2: 18 mmol/L — ABNORMAL LOW (ref 22–32)
Calcium: 8.6 mg/dL — ABNORMAL LOW (ref 8.9–10.3)
Chloride: 101 mmol/L (ref 101–111)
Creatinine, Ser: 0.91 mg/dL (ref 0.61–1.24)
GFR calc Af Amer: >60 mL/min (ref >60)
GFR calc non Af Amer: >60 mL/min (ref >60)
Glucose, Bld: 127 mg/dL — ABNORMAL HIGH (ref 65–99)
Potassium: 4.4 mmol/L (ref 3.5–5.1)
Sodium: 131 mmol/L — ABNORMAL LOW (ref 135–145)
Total Bilirubin: 3.1 mg/dL — ABNORMAL HIGH (ref 0.3–1.2)
Total Protein: 6.7 g/dL (ref 6.5–8.1)

## 2017-07-26 LAB — PHOSPHORUS: Phosphorus: 3.1 mg/dL (ref 2.5–4.6)

## 2017-07-26 LAB — HEPATIC FUNCTION PANEL
ALT: 137 U/L — ABNORMAL HIGH (ref 17–63)
AST: 323 U/L — ABNORMAL HIGH (ref 15–41)
Albumin: 2.8 g/dL — ABNORMAL LOW (ref 3.5–5.0)
Alkaline Phosphatase: 474 U/L — ABNORMAL HIGH (ref 38–126)
Bilirubin, Direct: 1.2 mg/dL — ABNORMAL HIGH (ref 0.1–0.5)
Indirect Bilirubin: 1.4 mg/dL — ABNORMAL HIGH (ref 0.3–0.9)
Total Bilirubin: 2.6 mg/dL — ABNORMAL HIGH (ref 0.3–1.2)
Total Protein: 6.7 g/dL (ref 6.5–8.1)

## 2017-07-26 LAB — CBC
HCT: 30 % — ABNORMAL LOW (ref 39.0–52.0)
Hemoglobin: 10.5 g/dL — ABNORMAL LOW (ref 13.0–17.0)
MCH: 33.5 pg (ref 26.0–34.0)
MCHC: 35 g/dL (ref 30.0–36.0)
MCV: 95.8 fL (ref 78.0–100.0)
Platelets: 210 10*3/uL (ref 150–400)
RBC: 3.13 MIL/uL — ABNORMAL LOW (ref 4.22–5.81)
RDW: 17.3 % — ABNORMAL HIGH (ref 11.5–15.5)
WBC: 3.3 10*3/uL — ABNORMAL LOW (ref 4.0–10.5)

## 2017-07-26 LAB — BASIC METABOLIC PANEL
Anion gap: 11 (ref 5–15)
BUN: 9 mg/dL (ref 6–20)
CO2: 19 mmol/L — ABNORMAL LOW (ref 22–32)
Calcium: 8.6 mg/dL — ABNORMAL LOW (ref 8.9–10.3)
Chloride: 100 mmol/L — ABNORMAL LOW (ref 101–111)
Creatinine, Ser: 0.92 mg/dL (ref 0.61–1.24)
GFR calc Af Amer: >60 mL/min (ref >60)
GFR calc non Af Amer: >60 mL/min (ref >60)
Glucose, Bld: 128 mg/dL — ABNORMAL HIGH (ref 65–99)
Potassium: 4.3 mmol/L (ref 3.5–5.1)
Sodium: 130 mmol/L — ABNORMAL LOW (ref 135–145)

## 2017-07-26 LAB — LACTIC ACID, PLASMA
Lactic Acid, Venous: 2.6 mmol/L (ref 0.5–1.9)
Lactic Acid, Venous: 1.6 mmol/L (ref 0.5–1.9)

## 2017-07-26 MED ORDER — VITAMIN B-1 100 MG PO TABS
100.0000 mg | ORAL_TABLET | Freq: Every day | ORAL | Status: DC
  Administered 2017-07-26 – 2017-07-28 (×3): 100 mg via ORAL
  Filled 2017-07-26 (×3): qty 1

## 2017-07-26 MED ORDER — PEG 3350-KCL-NA BICARB-NACL 420 G PO SOLR
4000.0000 mL | Freq: Once | ORAL | Status: AC
  Administered 2017-07-26: 4000 mL via ORAL
  Filled 2017-07-26: qty 4000

## 2017-07-26 MED ORDER — FOLIC ACID 1 MG PO TABS
1.0000 mg | ORAL_TABLET | Freq: Every day | ORAL | Status: DC
  Administered 2017-07-26 – 2017-07-28 (×3): 1 mg via ORAL
  Filled 2017-07-26 (×3): qty 1

## 2017-07-26 MED ORDER — ADULT MULTIVITAMIN W/MINERALS CH
1.0000 | ORAL_TABLET | Freq: Every day | ORAL | Status: DC
  Administered 2017-07-27 – 2017-07-28 (×2): 1 via ORAL
  Filled 2017-07-26 (×2): qty 1

## 2017-07-26 MED ORDER — ENSURE ENLIVE PO LIQD
237.0000 mL | Freq: Two times a day (BID) | ORAL | Status: DC
  Administered 2017-07-28: 237 mL via ORAL

## 2017-07-26 NOTE — Social Work (Signed)
CSW intern met with patient regarding substance abuse, housing, and other community resources. [name] was  laying down in bed and was alert, oriented and very engaged in the conversation. Explained resources given and  discussed substance abuse having a effect on his ability to secure subsidized housing. [name] informed CSW intern  of receiving supplemental security income but had to suspend his finances. CSW intern informed provided [name] with  information regarding receiving his income back. CSW will continue to follow and provide social work intervention services as  needed through discharge.    , CSW Intern  

## 2017-07-26 NOTE — Progress Notes (Signed)
[facility] Gastroenterology Progress Note  Devin [name] 57 y.o. [birth year]  CC:  Nausea, vomiting, diarrhea, abdominal pain.   Subjective: patient feeling better now. Diarrhea improving. Denied further vomiting. Continues to have nausea.  ROS : negative for chest pain and shortness of breath.   Objective: Vital signs in last 24 hours: Vitals:   07/26/17 0501 07/26/17 1027  BP: (!) 142/92 133/82  Pulse: 90 73  Resp: 18 16  Temp: 98 F (36.7 C) 98.3 F (36.8 C)  SpO2: 98% 100%    Physical Exam:  General:  Alert, cooperative, no distress, appears stated age.   Head:  Normocephalic, without obvious abnormality, atraumatic  Eyes:  No scleral icterus.  Lungs:   Clear to auscultation bilaterally, respirations unlabored  Heart:  Regular rate and rhythm, S1, S2 normal  Abdomen:   Soft, epigastric tenderness to palpation, nondistended, bowel sounds present. No peritoneal signs  Extremities: no  edema       Lab Results: Recent Labs    07/25/17 0923 07/25/17 1454 07/26/17 0046 07/26/17 0631  NA 123* 125* 131*  130*  --   K 5.2* 4.4 4.4  4.3  --   CL 93* 95* 101  100*  --   CO2 16* 19* 18*  19*  --   GLUCOSE 88 138* 127*  128*  --   BUN 10 6 9  9   --   CREATININE 0.70 0.92 0.91  0.92  --   CALCIUM 8.2* 8.5* 8.6*  8.6*  --   MG 1.9  --   --   --   PHOS  --   --   --  3.1   Recent Labs    07/26/17 0046 07/26/17 0631  AST 323* 323*  ALT 132* 137*  ALKPHOS 440* 474*  BILITOT 3.1* 2.6*  PROT 6.7 6.7  ALBUMIN 2.8* 2.8*   Recent Labs    07/24/17 2353 07/26/17 0046  WBC 5.2 3.3*  HGB 12.9* 10.5*  HCT 37.2* 30.0*  MCV 95.1 95.8  PLT 281 210   Recent Labs    07/25/17 0742  LABPROT 16.0*  INR 1.29      Assessment/Plan: - nausea vomiting and diarrhea for few days. Patient's symptoms are similar to his admission in November 2018. - ongoing alcohol as well as marijuana use. - intermittent dark blood in the stool. Symptom for last 6 months.HgB has  improved to 12.9. - Abnormal LFTs. Most likely from alcoholic hepatitis. Marland Kitchen Extensive workup negative in the past.discriminant function score of 9.4 on admission - Homelessness - history of LA grade C esophagitis based on EGD in January 2018  Recommendations --------------------------- - mild drop in hemoglobin could be dilutional. C. Difficile negative. - EGD  Tomorrow for follow-up of esophagitis. Colonoscopy tomorrow for intermittent rectal bleeding. - clear liquid diet today. Nothing by mouth past midnight. - risk,  benefits and alternatives are discussed with the patient. He verbalized understanding.  - CT scan today negative for acute changes. It showed pancreatic divisum as well as large duodenal diverticulum.His  recurrent nausea and vomiting could be from marijuana use. - GI will follow   [name] MD, [place] 07/26/2017, 12:17 PM  Contact #  [phone]

## 2017-07-26 NOTE — Progress Notes (Signed)
   Subjective: Devin [name] was seen laying in his bed doing well this morning. He stated that he still had some right upper quadrant pain. He feels that his diarrhea is improving and he has not had any vomiting today.  Objective:  Vital signs in last 24 hours: Vitals:   07/25/17 1622 07/25/17 2033 07/26/17 0501 07/26/17 1027  BP: (!) 154/92 110/77 (!) 142/92 133/82  Pulse: (!) 104 97 90 73  Resp: 18 18 18 16   Temp: 98.3 F (36.8 C) 98.3 F (36.8 C) 98 F (36.7 C) 98.3 F (36.8 C)  TempSrc: Oral Oral Oral Oral  SpO2: 100% 98% 98% 100%  Weight:      Height:       Physical Exam  Constitutional: He appears well-developed and well-nourished. No distress.  HENT:  Head: Normocephalic and atraumatic.  Eyes: Scleral icterus is present.  Cardiovascular: Normal rate, regular rhythm and normal heart sounds.  Respiratory: Effort normal and breath sounds normal. No respiratory distress. He has no wheezes.  GI: He exhibits no distension. There is tenderness (ruq tenderness).  Musculoskeletal: He exhibits no edema.  Neurological: He is alert.  Skin: He is not diaphoretic. No erythema.  Psychiatric: He has a normal mood and affect. His behavior is normal. Judgment and thought content normal.   Assessment/Plan:  Devin [name] is a 57 year old male with essential hypertension, alcoholic hepatitis, hyperlipidemia and esophagitis who presented with 2 day history of nausea, vomiting, diarrhea, abdominal pain,and several month history of dark colored stools. He had elevated lfts. CT scan did not show any acute abnormalities. He was evaluated by GI and scheduled for EGD and colonoscopy tomorrow 07/27/17.   Alcoholic hepatitis without ascites: The patient's ruq tenderness seem to be improving. The patient has had negative workup for other causes of his symptoms. He has been negative for hemochromatosis, wilson's disease, viral hepatitis, and autoimmune hepatitis.   His liver enzymes have come down from  ast=491, alt=207, alp=493, tbili=5.7 on admission to ast=323, alt=137, alp=474, tbili=2.6 His lactic acid level has normalized to 1.6.  The patient's last alcohol use was 2 days ago. His CIWA scores have been 2.   -continue Zofran prn - CIWA checks - limit benzodiapines if able in liver disease - Appreciate GI following  -EGD and colonoscopy for tomorrow 07/27/17  Hyponatremia/High Anion Gap Metabolic Acidosis: The patient's hyponatremia has resolved to 131 today from 121 on admission. It is likely due to hypovolemia secondary to gi losses.   -bmp in am 3/29  Intermittent Rectal Bleeding: There is concern for GI bleed. The patient's hb=10.5 today. He has been having chronically low hb.   -Will get EGD and colonoscopy on 3/29 to evaluate  HTN: The patient's blood pressure over the past 24 hrs has ranged 110-161/77-118.  - Continue Metoprolol 100 mg daily  Depression/Homelessness/Substance Use: - Continue Sertraline 50 mg daily - Consult Social work for [employer] use assistance  Dispo: Anticipated discharge in approximately 1-2 day(s).   Devin Mage, MD 07/26/2017, 2:25 PM Pager: [phone]

## 2017-07-26 NOTE — Progress Notes (Signed)
Initial Nutrition Assessment  DOCUMENTATION CODES:   Non-severe (moderate) malnutrition in context of social or environmental circumstances  INTERVENTION:   Ensure Enlive po BID, each supplement provides 350 kcal and 20 grams of protein  MVI daily  NUTRITION DIAGNOSIS:   Moderate Malnutrition related to social / environmental circumstances(EtOH abuse, homelessness) as evidenced by mild fat depletion, mild muscle depletion.  GOAL:   Patient will meet greater than or equal to 90% of their needs  MONITOR:   PO intake, Diet advancement, Supplement acceptance, Weight trends, Labs  REASON FOR ASSESSMENT:   Malnutrition Screening Tool    ASSESSMENT:    57 yo male admitted with N/V/D in setting of alcoholic hepatitis, rectal bleeding. Pt with hx of EtOH abuse, alcoholic hepatitis, chronic GERD, esophagitis. Pt is currently homeless   Pt reports appetite not great prior to admission due to abdominal "issues." Noted pt is homeless but pt reports access to food is not an issue as pt reports he receives food stamps. Pt does not elaborate much with regards to what he typically eats during the day. Also noted pt with EtOH abuse  Noted plan for EGD/colonoscopy tomorrow.  CT abdomen negative for acute process  Admission wt 140 pounds, weighed pt on visit today and pt weighed 145 pounds. No significant recent wt loss per weight encounters. Pt reports UBW of 155 pounds but unsure of when he last weighed this. Per weight encounters, pt between 144-151 pounds over the past year  With regards to physical function, pt reports he "walks a lot."  Labs: sodium 131 Meds: thiamine  NUTRITION - FOCUSED PHYSICAL EXAM:    Most Recent Value  Orbital Region  Mild depletion  Upper Arm Region  Mild depletion  Thoracic and Lumbar Region  Moderate depletion  Buccal Region  Mild depletion  Temple Region  Mild depletion  Clavicle Bone Region  Mild depletion  Clavicle and Acromion Bone Region  Mild  depletion  Scapular Bone Region  No depletion  Dorsal Hand  Mild depletion  Patellar Region  No depletion  Anterior Thigh Region  No depletion  Posterior Calf Region  No depletion  Edema (RD Assessment)  None       Diet Order:  Diet clear liquid Room service appropriate? Yes; Fluid consistency: Thin Diet NPO time specified  EDUCATION NEEDS:   Education needs have been addressed  Skin:  Skin Assessment: Reviewed RN Assessment  Last BM:  3/27  Height:   Ht Readings from Last 1 Encounters:  07/25/17 5\' 10"  (1.778 m)    Weight:   Wt Readings from Last 1 Encounters:  07/25/17 140 lb (63.5 kg)    Ideal Body Weight:  75.5 kg  BMI:  Body mass index is 20.09 kg/m.  Estimated Nutritional Needs:   Kcal:  1950-2250 kcals  Protein:  98-113 g  Fluid:  >/= 2 L   Kerman Passey MS, RD, LDN, CNSC [phone] Pager  [phone] Weekend/On-Call Pager

## 2017-07-27 ENCOUNTER — Encounter (HOSPITAL_COMMUNITY)
Admission: EM | Disposition: A | Discharge status: Home / Self Care | Payer: Self-pay | Source: Home / Self Care | Attending: Internal Medicine

## 2017-07-27 ENCOUNTER — Inpatient Hospital Stay (HOSPITAL_COMMUNITY): Payer: Medicaid Other | Admitting: Certified Registered Nurse Anesthetist

## 2017-07-27 ENCOUNTER — Encounter (HOSPITAL_COMMUNITY): Payer: Self-pay | Admitting: Anesthesiology

## 2017-07-27 DIAGNOSIS — K635 Polyp of colon: Secondary | ICD-10-CM

## 2017-07-27 HISTORY — PX: ESOPHAGOGASTRODUODENOSCOPY (EGD) WITH PROPOFOL: SHX5813

## 2017-07-27 HISTORY — PX: COLONOSCOPY WITH PROPOFOL: SHX5780

## 2017-07-27 LAB — HEPATIC FUNCTION PANEL
ALT: 114 U/L — ABNORMAL HIGH (ref 17–63)
AST: 211 U/L — ABNORMAL HIGH (ref 15–41)
Albumin: 2.8 g/dL — ABNORMAL LOW (ref 3.5–5.0)
Alkaline Phosphatase: 385 U/L — ABNORMAL HIGH (ref 38–126)
Bilirubin, Direct: 1.1 mg/dL — ABNORMAL HIGH (ref 0.1–0.5)
Indirect Bilirubin: 1.5 mg/dL — ABNORMAL HIGH (ref 0.3–0.9)
Total Bilirubin: 2.6 mg/dL — ABNORMAL HIGH (ref 0.3–1.2)
Total Protein: 7 g/dL (ref 6.5–8.1)

## 2017-07-27 LAB — BASIC METABOLIC PANEL
Anion gap: 10 (ref 5–15)
BUN: <5 mg/dL — ABNORMAL LOW (ref 6–20)
CO2: 23 mmol/L (ref 22–32)
Calcium: 9.1 mg/dL (ref 8.9–10.3)
Chloride: 99 mmol/L — ABNORMAL LOW (ref 101–111)
Creatinine, Ser: 0.73 mg/dL (ref 0.61–1.24)
GFR calc Af Amer: >60 mL/min (ref >60)
GFR calc non Af Amer: >60 mL/min (ref >60)
Glucose, Bld: 102 mg/dL — ABNORMAL HIGH (ref 65–99)
Potassium: 3.8 mmol/L (ref 3.5–5.1)
Sodium: 132 mmol/L — ABNORMAL LOW (ref 135–145)

## 2017-07-27 SURGERY — COLONOSCOPY WITH PROPOFOL
Anesthesia: Monitor Anesthesia Care

## 2017-07-27 MED ORDER — LIDOCAINE HCL (CARDIAC) 20 MG/ML IV SOLN
INTRAVENOUS | Status: DC | PRN
  Administered 2017-07-27: 40 mg via INTRAVENOUS
  Administered 2017-07-27: 60 mg via INTRAVENOUS
  Administered 2017-07-27: 20 mg via INTRAVENOUS

## 2017-07-27 MED ORDER — LACTATED RINGERS IV SOLN
INTRAVENOUS | Status: DC
  Administered 2017-07-27: 12:00:00 via INTRAVENOUS

## 2017-07-27 MED ORDER — PROPOFOL 10 MG/ML IV BOLUS
INTRAVENOUS | Status: DC | PRN
  Administered 2017-07-27: 20 mg via INTRAVENOUS
  Administered 2017-07-27: 30 mg via INTRAVENOUS
  Administered 2017-07-27: 20 mg via INTRAVENOUS
  Administered 2017-07-27: 50 mg via INTRAVENOUS

## 2017-07-27 MED ORDER — PHENYLEPHRINE HCL 10 MG/ML IJ SOLN
INTRAMUSCULAR | Status: DC | PRN
  Administered 2017-07-27 (×4): 80 ug via INTRAVENOUS

## 2017-07-27 MED ORDER — HYDROCORTISONE ACETATE 25 MG RE SUPP
25.0000 mg | Freq: Two times a day (BID) | RECTAL | Status: DC
  Administered 2017-07-27 – 2017-07-28 (×3): 25 mg via RECTAL
  Filled 2017-07-27 (×3): qty 1

## 2017-07-27 MED ORDER — SODIUM CHLORIDE 0.9 % IV SOLN: INTRAVENOUS | Status: DC

## 2017-07-27 MED ORDER — MIDAZOLAM HCL 5 MG/5ML IJ SOLN
INTRAMUSCULAR | Status: DC | PRN
Start: 2017-07-27 — End: 2017-07-27
  Administered 2017-07-27: 2 mg via INTRAVENOUS

## 2017-07-27 MED ORDER — PROPOFOL 500 MG/50ML IV EMUL
INTRAVENOUS | Status: DC | PRN
  Administered 2017-07-27: 75 ug/kg/min via INTRAVENOUS

## 2017-07-27 SURGICAL SUPPLY — 25 items

## 2017-07-27 NOTE — Anesthesia Preprocedure Evaluation (Addendum)
Anesthesia Evaluation  Patient identified by MRN, date of birth, ID band Patient awake    Reviewed: Allergy & Precautions, NPO status , Patient's Chart, lab work & pertinent test results  Airway Mallampati: II  TM Distance: >3 FB Neck ROM: Full    Dental  (+) Poor Dentition   Pulmonary Current Smoker,    Pulmonary exam normal breath sounds clear to auscultation       Cardiovascular hypertension, Pt. on medications and Pt. on home beta blockers + Past MI  Normal cardiovascular exam Rhythm:Regular Rate:Normal     Neuro/Psych PSYCHIATRIC DISORDERS Depression negative neurological ROS     GI/Hepatic GERD  Poorly Controlled, Controlled and Medicated,(+)     substance abuse  alcohol use, cocaine use and marijuana use, Hepatitis -GI Bleed Hx/o Esophagitis   Endo/Other  Hyperlipidemia  Renal/GU negative Renal ROS  negative genitourinary   Musculoskeletal  (+) Arthritis , Osteoarthritis,  Hx/o Gout   Abdominal   Peds  Hematology  (+) anemia ,   Anesthesia Other Findings   Reproductive/Obstetrics                           Anesthesia Physical Anesthesia Plan  ASA: III  Anesthesia Plan: MAC   Post-op Pain Management:    Induction: Intravenous  PONV Risk Score and Plan: 1 and Propofol infusion  Airway Management Planned:   Additional Equipment:   Intra-op Plan:   Post-operative Plan:   Informed Consent: I have reviewed the patients History and Physical, chart, labs and discussed the procedure including the risks, benefits and alternatives for the proposed anesthesia with the patient or authorized representative who has indicated his/her understanding and acceptance.   Dental advisory given  Plan Discussed with: CRNA, Anesthesiologist and Surgeon  Anesthesia Plan Comments:         Anesthesia Quick Evaluation

## 2017-07-27 NOTE — Clinical Social Work Note (Signed)
CSW reviewed CSW intern's 3/28 progress note regarding SW intervention services provided to patient. CSW and intern discussed consult prior to [place] Intern talking with Mr. Chretien.. Patient was discussed again after her visit with patient and intern was able to determine the appropriate intervention services, (which included resource information) needed. CSW intern's note co-signed today.  Sila Sarsfield Givens, MSW, LCSW Licensed Clinical Social Worker [place] [phone]

## 2017-07-27 NOTE — Anesthesia Procedure Notes (Signed)
Procedure Name: MAC Date/Time: 07/27/2017 12:48 PM Performed by: Glynda Jaeger, CRNA Pre-anesthesia Checklist: Patient identified, Emergency Drugs available, Suction available and Patient being monitored Patient Re-evaluated:Patient Re-evaluated prior to induction Oxygen Delivery Method: Nasal cannula Preoxygenation: Pre-oxygenation with 100% oxygen Induction Type: IV induction Placement Confirmation: positive ETCO2,  CO2 detector and breath sounds checked- equal and bilateral Dental Injury: Teeth and Oropharynx as per pre-operative assessment

## 2017-07-27 NOTE — Op Note (Signed)
[facility] Patient Name: Devin [name] Procedure Date : 07/27/2017 MRN: [id number] Attending MD: [name] , MD Date of Birth: 11-13-1960 CSN: [id number] Age: 57 Admit Type: Inpatient Procedure:                Upper GI endoscopy Indications:              Gastrointestinal bleeding of unknown origin,                            Follow-up of esophagitis Providers:                [name], MD, Laurell Roof, Technician Referring MD:              Medicines:                Sedation Administered by an Anesthesia Professional Complications:            No immediate complications. Estimated Blood Loss:     Estimated blood loss was minimal. Procedure:                Pre-Anesthesia Assessment:                           - Prior to the procedure, a History and Physical                            was performed, and patient medications and                            allergies were reviewed. The patient's tolerance of                            previous anesthesia was also reviewed. The risks                            and benefits of the procedure and the sedation                            options and risks were discussed with the patient.                            All questions were answered, and informed consent                            was obtained. Prior Anticoagulants: The patient has                            taken no previous anticoagulant or antiplatelet                            agents. ASA Grade Assessment: III - A patient with  severe systemic disease. After reviewing the risks                            and benefits, the patient was deemed in                            satisfactory condition to undergo the procedure.                           After obtaining informed consent, the endoscope was                            passed under direct vision. Throughout the      procedure, the patient's blood pressure, pulse, and                            oxygen saturations were monitored continuously. The                            [id number] ([id number]) scope was introduced through the                            mouth, and advanced to the second part of duodenum.                            The upper GI endoscopy was accomplished without                            difficulty. The patient tolerated the procedure                            well. Scope In: Scope Out: Findings:      The Z-line was regular and was found 39 cm from the incisors.      The exam of the esophagus was otherwise normal.      Normal mucosa was found in the entire examined stomach.      The cardia and gastric fundus were normal on retroflexion.      Scattered mild inflammation was found in the duodenal bulb.      The first portion of the duodenum and second portion of the duodenum       were normal. Impression:               - Z-line regular, 39 cm from the incisors.                           - Normal mucosa was found in the entire stomach.                           - Duodenitis.                           - Normal first portion of the duodenum and second  portion of the duodenum.                           - No specimens collected. Moderate Sedation:      Moderate (conscious) sedation was personally administered by an       anesthesia professional. The following parameters were monitored: oxygen       saturation, heart rate, blood pressure, and response to care. Recommendation:           - Return patient to hospital ward for ongoing care.                           - Resume previous diet.                           - Continue present medications.                           - Perform a colonoscopy today. Procedure Code(s):        --- Professional ---                           [phone], Esophagogastroduodenoscopy, flexible,                            transoral;  diagnostic, including collection of                            specimen(s) by brushing or washing, when performed                            (separate procedure) Diagnosis Code(s):        --- Professional ---                           K29.80, Duodenitis without bleeding                           K92.2, Gastrointestinal hemorrhage, unspecified                           K20.9, Esophagitis, unspecified CPT copyright 2016 American Medical Association. All rights reserved. The codes documented in this report are preliminary and upon coder review may  be revised to meet current compliance requirements. [name], MD [name], MD 07/27/2017 1:31:47 PM Number of Addenda: 0
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
## 2017-07-27 NOTE — Progress Notes (Signed)
[facility] Gastroenterology Progress Note  Devin [name] 57 y.o. 1960/10/25  CC:  Nausea, vomiting, diarrhea, abdominal pain.   Subjective:  Patient had some bleeding during the prep for colonoscopy. Denied nausea vomiting. Continues to have generalized abdominal discomfort.  ROS : negative for chest pain and shortness of breath.   Objective: Vital signs in last 24 hours: Vitals:   07/27/17 0423 07/27/17 1126  BP: 118/82 131/83  Pulse: 89 64  Resp: 16 17  Temp: 98.3 F (36.8 C) 98.2 F (36.8 C)  SpO2: 100% 99%    Physical Exam:  General:  Alert, cooperative, no distress, appears stated age.   Head:  Normocephalic, without obvious abnormality, atraumatic  Eyes:  No scleral icterus.  Lungs:   Clear to auscultation bilaterally, respirations unlabored  Heart:  Regular rate and rhythm, S1, S2 normal  Abdomen:   Soft, Generalized abdominal discomfort on palpation,  nondistended, bowel sounds present. No peritoneal signs  Extremities: no  edema       Lab Results: Recent Labs    07/25/17 0923  07/26/17 0046 07/26/17 0631 07/27/17 0811  NA 123*   < > 131*  130*  --  132*  K 5.2*   < > 4.4  4.3  --  3.8  CL 93*   < > 101  100*  --  99*  CO2 16*   < > 18*  19*  --  23  GLUCOSE 88   < > 127*  128*  --  102*  BUN 10   < > 9  9  --  <5*  CREATININE 0.70   < > 0.91  0.92  --  0.73  CALCIUM 8.2*   < > 8.6*  8.6*  --  9.1  MG 1.9  --   --   --   --   PHOS  --   --   --  3.1  --    < > = values in this interval not displayed.   Recent Labs    07/26/17 0046 07/26/17 0631  AST 323* 323*  ALT 132* 137*  ALKPHOS 440* 474*  BILITOT 3.1* 2.6*  PROT 6.7 6.7  ALBUMIN 2.8* 2.8*   Recent Labs    07/24/17 2353 07/26/17 0046  WBC 5.2 3.3*  HGB 12.9* 10.5*  HCT 37.2* 30.0*  MCV 95.1 95.8  PLT 281 210   Recent Labs    07/25/17 0742  LABPROT 16.0*  INR 1.29      Assessment/Plan: - nausea vomiting and diarrhea for few days. Patient's symptoms are similar to  his admission in November 2018. nausea and vomiting could be from marijuana use. - ongoing alcohol as well as marijuana use. - intermittent dark blood in the stool. Symptom for last 6 months.HgB has improved to 12.9. - Abnormal LFTs. Most likely from alcoholic hepatitis. Marland Kitchen Extensive workup negative in the past.discriminant function score of 9.4 on admission - Homelessness - history of LA grade C esophagitis based on EGD in January 2018  Recommendations --------------------------- - EGD and colonoscopy today.  Risks (bleeding, infection, bowel perforation that could require surgery, sedation-related changes in cardiopulmonary systems), benefits (identification and possible treatment of source of symptoms, exclusion of certain causes of symptoms), and alternatives (watchful waiting, radiographic imaging studies, empiric medical treatment)  were explained to patient in detail and patient wishes to proceed.  - C. Difficile negative.GI pathogen panel negative - CT scan negative for acute changes. 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    [name] MD, [place] 07/27/2017, 12:16 PM  Contact #  [phone]

## 2017-07-27 NOTE — Anesthesia Postprocedure Evaluation (Signed)
Anesthesia Post Note  Patient: Devin [name]  Procedure(s) Performed: COLONOSCOPY WITH PROPOFOL (N/A ) ESOPHAGOGASTRODUODENOSCOPY (EGD) WITH PROPOFOL (N/A )     Patient location during evaluation: PACU Anesthesia Type: MAC Level of consciousness: awake and alert and oriented Pain management: pain level controlled Vital Signs Assessment: post-procedure vital signs reviewed and stable Respiratory status: spontaneous breathing, nonlabored ventilation and respiratory function stable Cardiovascular status: blood pressure returned to baseline and stable Postop Assessment: no apparent nausea or vomiting Anesthetic complications: no    [name] Vitals:  Vitals:   07/27/17 1340 07/27/17 1345  BP: 116/86 122/78  Pulse: 82 80  Resp: 19 18  Temp:    SpO2: 100% 100%    [name] Pain:  Vitals:   07/27/17 1332  TempSrc: Oral  PainSc: 5                  Ashe Graybeal A.

## 2017-07-27 NOTE — Op Note (Signed)
[facility] Patient Name: Devin [name] Procedure Date : 07/27/2017 MRN: [id number] Attending MD: [name] , MD Date of Birth: [birth year] CSN: [id number] Age: 57 Admit Type: Inpatient Procedure:                Colonoscopy Indications:              This is the patient's first colonoscopy,                            Gastrointestinal bleeding Providers:                [name], MD, Angus Seller, Alan Mulder, Technician Referring MD:              Medicines:                Sedation Administered by an Anesthesia Professional Complications:            No immediate complications. Estimated Blood Loss:     Estimated blood loss was minimal. Procedure:                Pre-Anesthesia Assessment:                           - Prior to the procedure, a History and Physical                            was performed, and patient medications and                            allergies were reviewed. The patient's tolerance of                            previous anesthesia was also reviewed. The risks                            and benefits of the procedure and the sedation                            options and risks were discussed with the patient.                            All questions were answered, and informed consent                            was obtained. Prior Anticoagulants: The patient has                            taken no previous anticoagulant or antiplatelet                            agents. ASA Grade Assessment: III - A patient with  severe systemic disease. After reviewing the risks                            and benefits, the patient was deemed in                            satisfactory condition to undergo the procedure.                           After obtaining informed consent, the colonoscope                            was passed under direct vision. Throughout the   procedure, the patient's blood pressure, pulse, and                            oxygen saturations were monitored continuously. The                            [id number] ([id number]) scope was introduced through                            the anus and advanced to the the cecum, identified                            by appendiceal orifice and ileocecal valve. The                            colonoscopy was technically difficult and complex                            due to poor bowel prep with stool present. The                            patient tolerated the procedure well. The quality                            of the bowel preparation was poor. The ileocecal                            valve, appendiceal orifice, and rectum were                            photographed. The quality of the bowel preparation                            was poor. Scope In: 1:03:54 PM Scope Out: 1:19:13 PM Scope Withdrawal Time: 0 hours 11 minutes 48 seconds  Total Procedure Duration: 0 hours 15 minutes 19 seconds  Findings:      The perianal exam findings include non-thrombosed external hemorrhoids.      A large amount of solid stool was found in the entire colon, interfering       with visualization. Lavage of the area was performed, resulting in  incomplete clearance with continued poor visualization.      Two sessile polyps were found in the proximal ascending colon. The       polyps were 4 to 5 mm in size. These polyps were removed with a hot       snare. Resection and retrieval were complete.      A 8 mm polyp was found in the hepatic flexure. The polyp was sessile.       The polyp was removed with a hot snare. Resection and retrieval were       complete.      Internal hemorrhoids were found during retroflexion. The hemorrhoids       were small. mild inflammation with erythema noted near dentate line. Impression:               - Preparation of the colon was poor.                           -  Non-thrombosed external hemorrhoids found on                            perianal exam.                           - Stool in the entire examined colon.                           - Two 4 to 5 mm polyps in the proximal ascending                            colon, removed with a hot snare. Resected and                            retrieved.                           - One 8 mm polyp at the hepatic flexure, removed                            with a hot snare. Resected and retrieved.                           - Internal hemorrhoids. Moderate Sedation:      Moderate (conscious) sedation was personally administered by an       anesthesia professional. The following parameters were monitored: oxygen       saturation, heart rate, blood pressure, and response to care. Recommendation:           - Return patient to hospital ward for ongoing care.                           - Resume regular diet.                           - Continue present medications.                           - Await pathology results.                           -  Repeat colonoscopy in 1 year because the bowel                            preparation was suboptimal.                           - Return to my office PRN.                           - Use hydrocortisone suppository 25 mg 2 per rectum                            once a day. Procedure Code(s):        --- Professional ---                           [phone], Colonoscopy, flexible; with removal of                            tumor(s), polyp(s), or other lesion(s) by snare                            technique Diagnosis Code(s):        --- Professional ---                           K64.4, Residual hemorrhoidal skin tags                           K64.8, Other hemorrhoids                           D12.2, Benign neoplasm of ascending colon                           D12.3, Benign neoplasm of transverse colon (hepatic                            flexure or splenic flexure)                            K92.2, Gastrointestinal hemorrhage, unspecified CPT copyright 2016 American Medical Association. All rights reserved. The codes documented in this report are preliminary and upon coder review may  be revised to meet current compliance requirements. [name], MD [name], MD 07/27/2017 1:38:40 PM Number of Addenda: 0

## 2017-07-27 NOTE — Brief Op Note (Addendum)
07/25/2017 - 07/27/2017  1:38 PM  PATIENT:  Devin [name]  57 y.o. male  PRE-OPERATIVE DIAGNOSIS:  GI bleed, H/O esophagitis  POST-OPERATIVE DIAGNOSIS:  EGD-mild duodenitis colon - colon polyps removed using hot snare, hemorrhoids  PROCEDURE:  Procedure(s) with comments: COLONOSCOPY WITH PROPOFOL (N/A) ESOPHAGOGASTRODUODENOSCOPY (EGD) WITH PROPOFOL (N/A) - 43235  SURGEON:  Surgeon(s) and Role:    * Geoge Lawrance, MD - Primary  Findings ------------- - EGD showed mild inflammation in the duodenal bulb otherwise normal. No evidence of bleeding or esophagitis. - Colonoscopy showed poor prep.3 small to medium-sized polyp removed with hot snare. Retroflexion showed mild inflammation around the dentate line   Recommendations --------------------------- - Start regular diet - Anusol suppository for 2 weeks -Recommend repeat colonoscopy in one year because of poor prep - okay to discharge from GI standpoint. - GI will sign off. Call us back if needed. Follow up with GI as needed.  [name] MD, [place] 07/27/2017, 1:40 PM  Contact #  [phone]

## 2017-07-27 NOTE — Transfer of Care (Signed)
Immediate Anesthesia Transfer of Care Note  Patient: Devin [name]  Procedure(s) Performed: COLONOSCOPY WITH PROPOFOL (N/A ) ESOPHAGOGASTRODUODENOSCOPY (EGD) WITH PROPOFOL (N/A )  Patient Location: PACU  Anesthesia Type:MAC  Level of Consciousness: awake, patient cooperative and responds to stimulation  Airway & Oxygen Therapy: Patient Spontanous Breathing and Patient connected to nasal cannula oxygen  Post-op Assessment: Report given to RN, Post -op Vital signs reviewed and stable and Patient moving all extremities X 4  Post vital signs: Reviewed and stable  Last Vitals:  Vitals Value Taken Time  BP 86/54 07/27/2017  1:28 PM  Temp    Pulse 78 07/27/2017  1:30 PM  Resp 21 07/27/2017  1:30 PM  SpO2 100 % 07/27/2017  1:30 PM  Vitals shown include unvalidated device data.  Last Pain:  Vitals:   07/27/17 1126  TempSrc: Oral  PainSc: 5       Patients Stated Pain Goal: 1 (41/42/39 5320)  Complications: No apparent anesthesia complications

## 2017-07-28 DIAGNOSIS — F1290 Cannabis use, unspecified, uncomplicated: Secondary | ICD-10-CM

## 2017-07-28 DIAGNOSIS — K648 Other hemorrhoids: Secondary | ICD-10-CM

## 2017-07-28 DIAGNOSIS — K2980 Duodenitis without bleeding: Secondary | ICD-10-CM

## 2017-07-28 MED ORDER — ONDANSETRON HCL 4 MG PO TABS: 4.0000 mg | ORAL_TABLET | Freq: Four times a day (QID) | ORAL | 0 refills | Status: DC | PRN

## 2017-07-28 MED ORDER — HYDROCORTISONE ACETATE 25 MG RE SUPP: 25.0000 mg | Freq: Two times a day (BID) | RECTAL | 0 refills | Status: DC

## 2017-07-28 NOTE — Progress Notes (Signed)
Patient discharged to home. After visit Summary reviewed. Patient capable of reverbalizing medications and follow up visits. No signs and symptoms of distress noted. Patient educated to return to the ED in the case of an emergency.Devin [name] K [name]  

## 2017-07-28 NOTE — Progress Notes (Signed)
   Subjective: [name] was seen laying in his bed doing well this morning. No complaints, OK with discharge.   Objective:  Vital signs in last 24 hours: Vitals:   07/27/17 1745 07/27/17 2012 07/28/17 0700 07/28/17 0935  BP: 120/79 135/79 126/85 132/79  Pulse: 92 (!) 51 83 97  Resp: 16 16 16 18   Temp: 97.6 F (36.4 C) 99 F (37.2 C) 98.4 F (36.9 C) 98.5 F (36.9 C)  TempSrc: Oral Oral Oral Oral  SpO2: 100% 99% 100% 100%  Weight:  148 lb (67.1 kg)    Height:       Physical Exam  Constitutional: He appears well-developed and well-nourished. No distress.  HENT:  Head: Normocephalic and atraumatic.  Eyes: No scleral icterus.  Cardiovascular: Normal rate, regular rhythm and normal heart sounds.  Respiratory: Effort normal and breath sounds normal. No respiratory distress. He has no wheezes.  GI: He exhibits no distension. There is tenderness (ruq tenderness).  Musculoskeletal: He exhibits no edema.  Neurological: He is alert.  Skin: He is not diaphoretic. No erythema.  Psychiatric: He has a normal mood and affect.   Assessment/Plan: [name] is a 57 year old male with essential hypertension, alcoholic hepatitis, hyperlipidemia and esophagitis who presented with 2 day history of nausea, vomiting, diarrhea, abdominal pain, and several month history of dark colored stools. He had elevated lfts but Hgb at baseline. CT scan did not show any acute abnormalities. EGD and colonoscopy with mild inflammation about the duodenal bulb and 3 small polyps were removed.   Alcoholic hepatitis without ascites Alcohol-use Disorder RUQ TTP and LFTs slowly improving. He's had an extensive work-up in the past which has been negative for hemochromatosis, wilson's disease, viral hepatitis, and autoimmune hepatitis. Has not experienced any significant EtOH WD syx since admission. Tolerating PO and agreeable to dc. -DC home today   Intermittent Rectal Bleeding: EGD/COLO without evidence for acute  blood loss. Hb is actually at his baseline.   HTN: Stable. Continue Metoprolol 100 mg daily  Depression/Homelessness/Substance Use: - Continue Sertraline 50 mg daily - Consult Social work for [employer] use assistance  Dispo: Anticipated discharge today.  Devin Cappelletti, DO 07/28/2017, 9:56 AM Pager: [phone]

## 2017-07-28 NOTE — Discharge Summary (Addendum)
Name: Devin [name] MRN: [id number] DOB: 01-15-1961 57 y.o. PCP: Devin Roan, MD  Date of Admission: 07/25/2017  5:57 AM Date of Discharge: 07/28/17 Attending Physician: Devin Kilts, MD  Discharge Diagnosis:  Principal Problem:   Alcoholic hepatitis without ascites  Active Problems:   Depression   GERD with esophagitis   Musculoskeletal chest pain   Essential hypertension   Malnutrition of moderate degree   Discharge Medications: Allergies as of 07/28/2017   No Known Allergies     Medication List    STOP taking these medications   dicyclomine 20 MG tablet Commonly known as:  BENTYL   sucralfate 1 g tablet Commonly known as:  CARAFATE     TAKE these medications   aspirin EC 81 MG tablet Take 1 tablet (81 mg total) by mouth daily.   atorvastatin 40 MG tablet Commonly known as:  LIPITOR Take 1 tablet (40 mg total) by mouth daily.   hydrocortisone 25 MG suppository Commonly known as:  ANUSOL-HC Place 1 suppository (25 mg total) rectally 2 (two) times daily.   metoprolol succinate 100 MG 24 hr tablet Commonly known as:  TOPROL-XL Take 1 tablet (100 mg total) by mouth daily. Take with or immediately following a meal.   nitroGLYCERIN 0.4 MG SL tablet Commonly known as:  NITROSTAT Place 0.4 mg under the tongue every 5 (five) minutes as needed for chest pain.   omeprazole 20 MG capsule Commonly known as:  PRILOSEC Take 1 capsule (20 mg total) by mouth daily.   ondansetron 4 MG tablet Commonly known as:  ZOFRAN Take 1 tablet (4 mg total) by mouth every 6 (six) hours as needed for nausea.   sertraline 50 MG tablet Commonly known as:  ZOLOFT Take 1 tablet (50 mg total) by mouth daily.       Disposition and follow-up:   Devin [name] was discharged from [facility] in stable condition.  At the hospital follow up visit please address:  1.  Alcoholic hepatitis without ascites: please ensure that the patient is taking  hydrocortisone suppository 25mg  2 per rectum qd. Please continue to encourage patient to abstain from alcohol use. Needs repeat colonoscopy in 1 year.   2.  Labs / imaging needed at time of follow-up: repeat colonoscopy in 1 year  3.  Pending labs/ test needing follow-up: none  Follow-up Appointments:   Hospital Course by problem list: Principal Problem:   Alcoholic hepatitis without ascites Active Problems:   Depression   GERD with esophagitis   Musculoskeletal chest pain   Essential hypertension   Malnutrition of moderate degree   Alcoholic hepatitis without ascites: The patient presented with nausea, vomiting, diarrhea, abdominal pain, intermittent dark blood in stool after continuing alcohol and marijuana use. His last drink being the morning prior to admission. Labs were significant for anion gap metabolic acidosis with worsening LFTs in (ast=323 and alt=137). The patient had workup for  wilson's, hemochromatosis, viral hepatits, autoimmune hepatitis that was negative (anti-smooth muscle antibody, alpha-1 antitrypsin level, low ceruloplasmin, hfe gene negative but elevated ferritin=1456 and iron sat=101) during a prior hospitalization in November 2018. Ct abdomen showed diffuse fatty infiltration of liver, no cirrhotic changes, no acute abdominal/pelvic findings, incidental pancreatic divisum found. Patient's discriminant function score=9.4. GI pathogen panel and c.dificile were negative. His hb remained at baseline. EGD and colonoscopy showed duodenitis, 3 polyps (2 in the proximal ascending colon and 1 polyp at hepatic flexure) that were resected, internal hemorrhoids. The patient was  discharged with instructions to use hydrocortisone suppository 25mg  2 per rectum qd and get a repeat colonoscopy in 1 year due to poor quality of colonoscopy. The patient did not have alcohol withdrawal symptoms and his ciwa scores continually remained low. Patient was stable at discharge.   Discharge  Vitals:   BP 132/79 (BP Location: Right Arm)   Pulse 97   Temp 98.5 F (36.9 C) (Oral)   Resp 18   Ht 5\' 10"  (1.778 m)   Wt 148 lb (67.1 kg)   SpO2 100%   BMI 21.24 kg/m   Pertinent Labs, Studies, and Procedures:  CBC Latest Ref Rng & Units 07/26/2017 07/24/2017 07/08/2017  WBC 4.0 - 10.5 K/uL 3.3(L) 5.2 3.6(L)  Hemoglobin 13.0 - 17.0 g/dL 10.5(L) 12.9(L) 10.6(L)  Hematocrit 39.0 - 52.0 % 30.0(L) 37.2(L) 31.3(L)  Platelets 150 - 400 K/uL 210 281 184   BMP Latest Ref Rng & Units 07/27/2017 07/26/2017 07/26/2017  Glucose 65 - 99 mg/dL 102(H) 127(H) 128(H)  BUN 6 - 20 mg/dL <5(L) 9 9  Creatinine 0.61 - 1.24 mg/dL 0.73 0.91 0.92  BUN/Creat Ratio 9 - 20 - - -  Sodium 135 - 145 mmol/L 132(L) 131(L) 130(L)  Potassium 3.5 - 5.1 mmol/L 3.8 4.4 4.3  Chloride 101 - 111 mmol/L 99(L) 101 100(L)  CO2 22 - 32 mmol/L 23 18(L) 19(L)  Calcium 8.9 - 10.3 mg/dL 9.1 8.6(L) 8.6(L)   CMP Latest Ref Rng & Units 07/27/2017 07/26/2017 07/26/2017  Glucose 65 - 99 mg/dL 102(H) - 127(H)  BUN 6 - 20 mg/dL <5(L) - 9  Creatinine 0.61 - 1.24 mg/dL 0.73 - 0.91  Sodium 135 - 145 mmol/L 132(L) - 131(L)  Potassium 3.5 - 5.1 mmol/L 3.8 - 4.4  Chloride 101 - 111 mmol/L 99(L) - 101  CO2 22 - 32 mmol/L 23 - 18(L)  Calcium 8.9 - 10.3 mg/dL 9.1 - 8.6(L)  Total Protein 6.5 - 8.1 g/dL 7.0 6.7 6.7  Total Bilirubin 0.3 - 1.2 mg/dL 2.6(H) 2.6(H) 3.1(H)  Alkaline Phos 38 - 126 U/L 385(H) 474(H) 440(H)  AST 15 - 41 U/L 211(H) 323(H) 323(H)  ALT 17 - 63 U/L 114(H) 137(H) 132(H)   Colonoscopy (07/27/17): - Preparation of the colon was poor. - Non-thrombosed external hemorrhoids found on perianal exam. - Stool in the entire examined colon. - Two 4 to 5 mm polyps in the proximal ascending colon, removed with a hot snare. Resected and retrieved. - One 8 mm polyp at the hepatic flexure, removed with a hot snare. Resected and retrieved. - Internal hemorrhoids.  Recommendations - Return patient to hospital ward for ongoing  care. - Resume regular diet. - Continue present medications. - Await pathology results. - Repeat colonoscopy in 1 year because the bowel preparation was suboptimal. - Return to my office PRN. - Use hydrocortisone suppository 25 mg 2 per rectum once a day.  Endoscopy (07/27/17): -z-line regular, 39cm from the incisors -Normal mucosa was found in the entire stomach -Duodenitis -Normal first portion of the duodenum and second portion of the duodenum -No specimens collected  Recommendation: -return patient to hospital ward for ongoing care -resume previous diet -continue present medications -perform a colonoscopy today  CT abdomen (07/25/17): 1. Mild diffuse fatty infiltration of the liver but no morphologic changes of cirrhosis or worrisome hepatic lesions. 2. No acute abdominal/pelvic findings, mass lesions or adenopathy. 3. Incidental pancreatic divisum. 4. 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   Discharge Instructions:   Signed: Lars Mage, MD Internal Medicine  PGY1 Pager:[phone]

## 2017-07-29 ENCOUNTER — Encounter (HOSPITAL_COMMUNITY): Payer: Self-pay | Admitting: Gastroenterology

## 2017-08-03 ENCOUNTER — Emergency Department (HOSPITAL_COMMUNITY)
Admission: EM | Admit: 2017-08-03 | Discharge: 2017-08-03 | Disposition: A | Discharge status: Home / Self Care | Payer: Medicaid Other | Attending: Emergency Medicine | Admitting: Emergency Medicine

## 2017-08-03 ENCOUNTER — Encounter (HOSPITAL_COMMUNITY): Payer: Self-pay | Admitting: *Deleted

## 2017-08-03 ENCOUNTER — Emergency Department (HOSPITAL_COMMUNITY): Payer: Medicaid Other

## 2017-08-03 ENCOUNTER — Other Ambulatory Visit: Payer: Self-pay

## 2017-08-03 DIAGNOSIS — R05 Cough: Secondary | ICD-10-CM | POA: Insufficient documentation

## 2017-08-03 DIAGNOSIS — R110 Nausea: Secondary | ICD-10-CM | POA: Insufficient documentation

## 2017-08-03 DIAGNOSIS — R1013 Epigastric pain: Secondary | ICD-10-CM | POA: Diagnosis not present

## 2017-08-03 DIAGNOSIS — Z7982 Long term (current) use of aspirin: Secondary | ICD-10-CM | POA: Diagnosis not present

## 2017-08-03 DIAGNOSIS — I10 Essential (primary) hypertension: Secondary | ICD-10-CM | POA: Diagnosis not present

## 2017-08-03 DIAGNOSIS — Z79899 Other long term (current) drug therapy: Secondary | ICD-10-CM | POA: Insufficient documentation

## 2017-08-03 DIAGNOSIS — Z7289 Other problems related to lifestyle: Secondary | ICD-10-CM

## 2017-08-03 DIAGNOSIS — F1210 Cannabis abuse, uncomplicated: Secondary | ICD-10-CM | POA: Insufficient documentation

## 2017-08-03 DIAGNOSIS — F1090 Alcohol use, unspecified, uncomplicated: Secondary | ICD-10-CM

## 2017-08-03 DIAGNOSIS — I252 Old myocardial infarction: Secondary | ICD-10-CM | POA: Insufficient documentation

## 2017-08-03 DIAGNOSIS — R0789 Other chest pain: Secondary | ICD-10-CM

## 2017-08-03 DIAGNOSIS — F17210 Nicotine dependence, cigarettes, uncomplicated: Secondary | ICD-10-CM | POA: Insufficient documentation

## 2017-08-03 DIAGNOSIS — Z789 Other specified health status: Secondary | ICD-10-CM | POA: Insufficient documentation

## 2017-08-03 DIAGNOSIS — R079 Chest pain, unspecified: Secondary | ICD-10-CM | POA: Diagnosis present

## 2017-08-03 LAB — BASIC METABOLIC PANEL
Anion gap: 15 (ref 5–15)
BUN: 16 mg/dL (ref 6–20)
CALCIUM: 8.9 mg/dL (ref 8.9–10.3)
CHLORIDE: 97 mmol/L — AB (ref 101–111)
CO2: 20 mmol/L — AB (ref 22–32)
CREATININE: 0.6 mg/dL — AB (ref 0.61–1.24)
GFR calc non Af Amer: >60 mL/min (ref >60)
Glucose, Bld: 95 mg/dL (ref 65–99)
Potassium: 4.2 mmol/L (ref 3.5–5.1)
Sodium: 132 mmol/L — ABNORMAL LOW (ref 135–145)

## 2017-08-03 LAB — HEPATIC FUNCTION PANEL
ALK PHOS: 417 U/L — AB (ref 38–126)
ALT: 87 U/L — AB (ref 17–63)
AST: 187 U/L — AB (ref 15–41)
Albumin: 3.3 g/dL — ABNORMAL LOW (ref 3.5–5.0)
BILIRUBIN DIRECT: 0.9 mg/dL — AB (ref 0.1–0.5)
BILIRUBIN INDIRECT: 1.4 mg/dL — AB (ref 0.3–0.9)
BILIRUBIN TOTAL: 2.3 mg/dL — AB (ref 0.3–1.2)
Total Protein: 7.8 g/dL (ref 6.5–8.1)

## 2017-08-03 LAB — I-STAT TROPONIN, ED
TROPONIN I, POC: 0 ng/mL (ref 0.00–0.08)
Troponin i, poc: 0 ng/mL (ref 0.00–0.08)

## 2017-08-03 LAB — CBC
HCT: 34.3 % — ABNORMAL LOW (ref 39.0–52.0)
Hemoglobin: 11.4 g/dL — ABNORMAL LOW (ref 13.0–17.0)
MCH: 32.3 pg (ref 26.0–34.0)
MCHC: 33.2 g/dL (ref 30.0–36.0)
MCV: 97.2 fL (ref 78.0–100.0)
PLATELETS: 221 10*3/uL (ref 150–400)
RBC: 3.53 MIL/uL — AB (ref 4.22–5.81)
RDW: 17.1 % — AB (ref 11.5–15.5)
WBC: 4.8 10*3/uL (ref 4.0–10.5)

## 2017-08-03 LAB — LIPASE, BLOOD: Lipase: 28 U/L (ref 11–51)

## 2017-08-03 MED ORDER — GI COCKTAIL ~~LOC~~
30.0000 mL | Freq: Once | ORAL | Status: AC
Start: 2017-08-03 — End: 2017-08-03
  Administered 2017-08-03: 30 mL via ORAL
  Filled 2017-08-03: qty 30

## 2017-08-03 MED ORDER — ONDANSETRON 4 MG PO TBDP
4.0000 mg | ORAL_TABLET | Freq: Once | ORAL | Status: AC
  Administered 2017-08-03: 4 mg via ORAL
  Filled 2017-08-03: qty 1
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
## 2017-08-03 NOTE — ED Triage Notes (Signed)
Pt here from home via [facility] for chest pain when coughing and palpation, emesis.  Hx of drug and etoh abuse.

## 2017-08-03 NOTE — ED Provider Notes (Signed)
Patient placed in Quick Look pathway, seen and evaluated   Chief Complaint: Chest pain  HPI:   Progressive onset substernal nonradiating chest pain which started this morning with associated vomiting. Denies history of DVT/PE, recent travel, prolonged immobilization, recent surgery, lower extremity edema or calf pain.  He also endorses diarrhea and bleeding hemorrhoids at times.  ROS: Nausea vomiting, no lower extremity edema or pain, no shortness of breath.  Physical Exam:   Gen: No distress  Neuro: Awake and Alert  Skin: Warm    Focused Exam: Afebrile, nontoxic-appearing, speaking in full sentences no acute distress. Tachycardic 102.   Initiation of care has begun. The patient has been counseled on the process, plan, and necessity for staying for the completion/evaluation, and the remainder of the medical screening examination     Dossie Der 08/03/17 1449    Tegeler, Gwenyth Allegra, MD 08/03/17 [phone]

## 2017-08-03 NOTE — ED Notes (Signed)
Bus pass given to pt. E-signature not available pt verbalized understanding of DC instructions

## 2017-08-04 ENCOUNTER — Encounter (HOSPITAL_COMMUNITY): Payer: Self-pay | Admitting: Emergency Medicine

## 2017-08-04 ENCOUNTER — Emergency Department (HOSPITAL_COMMUNITY)
Admission: EM | Admit: 2017-08-04 | Discharge: 2017-08-04 | Disposition: A | Discharge status: Home / Self Care | Payer: Medicaid Other | Attending: Emergency Medicine | Admitting: Emergency Medicine

## 2017-08-04 DIAGNOSIS — I10 Essential (primary) hypertension: Secondary | ICD-10-CM | POA: Diagnosis not present

## 2017-08-04 DIAGNOSIS — F17210 Nicotine dependence, cigarettes, uncomplicated: Secondary | ICD-10-CM | POA: Diagnosis not present

## 2017-08-04 DIAGNOSIS — K7010 Alcoholic hepatitis without ascites: Secondary | ICD-10-CM

## 2017-08-04 DIAGNOSIS — K2920 Alcoholic gastritis without bleeding: Secondary | ICD-10-CM | POA: Diagnosis not present

## 2017-08-04 DIAGNOSIS — Z79899 Other long term (current) drug therapy: Secondary | ICD-10-CM | POA: Diagnosis not present

## 2017-08-04 DIAGNOSIS — R110 Nausea: Secondary | ICD-10-CM | POA: Diagnosis present

## 2017-08-04 DIAGNOSIS — Z7982 Long term (current) use of aspirin: Secondary | ICD-10-CM | POA: Diagnosis not present

## 2017-08-04 MED ORDER — GI COCKTAIL ~~LOC~~
30.0000 mL | Freq: Once | ORAL | Status: AC
  Administered 2017-08-04: 30 mL via ORAL
  Filled 2017-08-04: qty 30

## 2017-08-04 MED ORDER — SODIUM CHLORIDE 0.9 % IV BOLUS: 1000.0000 mL | Freq: Once | INTRAVENOUS | Status: DC

## 2017-08-04 NOTE — Discharge Instructions (Addendum)
Do not drink alcohol Take your Prilosec Uses Zofran as needed for nausea. Recheck with your doctor next week

## 2017-08-04 NOTE — ED Triage Notes (Signed)
Pt. Stated, I was here and thought the night and I was going to catch the bus and it started again with stomach pain and cant keep nothing down.

## 2017-08-04 NOTE — ED Notes (Signed)
Pt sleeping in waiting room. Was seen last night and discharged. This tech woke the pt up and he stated he did not feel good and was going to check back in. No distress noted. RN stated he has been here all night.

## 2017-08-04 NOTE — ED Triage Notes (Signed)
Last something to drink yesterday morning. I DRANK A 40 oz .
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
## 2017-08-05 ENCOUNTER — Encounter (HOSPITAL_COMMUNITY): Payer: Self-pay

## 2017-08-05 ENCOUNTER — Other Ambulatory Visit: Payer: Self-pay

## 2017-08-05 DIAGNOSIS — F1010 Alcohol abuse, uncomplicated: Secondary | ICD-10-CM | POA: Insufficient documentation

## 2017-08-05 DIAGNOSIS — F17210 Nicotine dependence, cigarettes, uncomplicated: Secondary | ICD-10-CM | POA: Diagnosis not present

## 2017-08-05 DIAGNOSIS — K2920 Alcoholic gastritis without bleeding: Secondary | ICD-10-CM | POA: Diagnosis not present

## 2017-08-05 DIAGNOSIS — Z79899 Other long term (current) drug therapy: Secondary | ICD-10-CM | POA: Insufficient documentation

## 2017-08-05 DIAGNOSIS — I10 Essential (primary) hypertension: Secondary | ICD-10-CM | POA: Insufficient documentation

## 2017-08-05 DIAGNOSIS — Z7982 Long term (current) use of aspirin: Secondary | ICD-10-CM | POA: Diagnosis not present

## 2017-08-05 DIAGNOSIS — R1013 Epigastric pain: Secondary | ICD-10-CM | POA: Diagnosis present

## 2017-08-05 LAB — COMPREHENSIVE METABOLIC PANEL
ALT: 93 U/L — AB (ref 17–63)
AST: 231 U/L — AB (ref 15–41)
Albumin: 3.1 g/dL — ABNORMAL LOW (ref 3.5–5.0)
Alkaline Phosphatase: 436 U/L — ABNORMAL HIGH (ref 38–126)
Anion gap: 12 (ref 5–15)
BUN: 12 mg/dL (ref 6–20)
CHLORIDE: 97 mmol/L — AB (ref 101–111)
CO2: 18 mmol/L — AB (ref 22–32)
CREATININE: 0.71 mg/dL (ref 0.61–1.24)
Calcium: 8.4 mg/dL — ABNORMAL LOW (ref 8.9–10.3)
GFR calc Af Amer: >60 mL/min (ref >60)
Glucose, Bld: 77 mg/dL (ref 65–99)
Potassium: 4.1 mmol/L (ref 3.5–5.1)
Sodium: 127 mmol/L — ABNORMAL LOW (ref 135–145)
Total Bilirubin: 2.7 mg/dL — ABNORMAL HIGH (ref 0.3–1.2)
Total Protein: 7.2 g/dL (ref 6.5–8.1)

## 2017-08-05 LAB — URINALYSIS, ROUTINE W REFLEX MICROSCOPIC
Bilirubin Urine: NEGATIVE
GLUCOSE, UA: NEGATIVE mg/dL
HGB URINE DIPSTICK: NEGATIVE
KETONES UR: NEGATIVE mg/dL
Leukocytes, UA: NEGATIVE
Nitrite: NEGATIVE
PROTEIN: NEGATIVE mg/dL
Specific Gravity, Urine: 1.009 (ref 1.005–1.030)
pH: 6 (ref 5.0–8.0)

## 2017-08-05 LAB — CBC
HCT: 30.6 % — ABNORMAL LOW (ref 39.0–52.0)
Hemoglobin: 10.3 g/dL — ABNORMAL LOW (ref 13.0–17.0)
MCH: 33.2 pg (ref 26.0–34.0)
MCHC: 33.7 g/dL (ref 30.0–36.0)
MCV: 98.7 fL (ref 78.0–100.0)
PLATELETS: 235 10*3/uL (ref 150–400)
RBC: 3.1 MIL/uL — ABNORMAL LOW (ref 4.22–5.81)
RDW: 16.5 % — AB (ref 11.5–15.5)
WBC: 5 10*3/uL (ref 4.0–10.5)

## 2017-08-05 LAB — LIPASE, BLOOD: LIPASE: 44 U/L (ref 11–51)

## 2017-08-05 LAB — I-STAT TROPONIN, ED: Troponin i, poc: 0 ng/mL (ref 0.00–0.08)

## 2017-08-05 NOTE — ED Triage Notes (Addendum)
States abdominal pain for over a week with diarrhea voiced no respiratory or acute distress noted states hard to breathe at times. States some pressure in chest.

## 2017-08-06 ENCOUNTER — Emergency Department (HOSPITAL_COMMUNITY)
Admission: EM | Admit: 2017-08-06 | Discharge: 2017-08-06 | Disposition: A | Discharge status: Home / Self Care | Payer: Medicaid Other | Attending: Emergency Medicine | Admitting: Emergency Medicine

## 2017-08-06 DIAGNOSIS — K2920 Alcoholic gastritis without bleeding: Secondary | ICD-10-CM

## 2017-08-06 DIAGNOSIS — F1010 Alcohol abuse, uncomplicated: Secondary | ICD-10-CM

## 2017-08-06 DIAGNOSIS — R1013 Epigastric pain: Secondary | ICD-10-CM

## 2017-08-06 MED ORDER — SUCRALFATE 1 GM/10ML PO SUSP
1.0000 g | Freq: Three times a day (TID) | ORAL | 0 refills | Status: DC
Start: 2017-08-06 — End: 2017-11-18

## 2017-08-06 MED ORDER — ONDANSETRON HCL 4 MG/2ML IJ SOLN
4.0000 mg | Freq: Once | INTRAMUSCULAR | Status: AC
  Administered 2017-08-06: 4 mg via INTRAVENOUS
  Filled 2017-08-06: qty 2

## 2017-08-06 MED ORDER — MORPHINE SULFATE (PF) 4 MG/ML IV SOLN
4.0000 mg | Freq: Once | INTRAVENOUS | Status: AC
  Administered 2017-08-06: 4 mg via INTRAVENOUS
  Filled 2017-08-06: qty 1

## 2017-08-06 MED ORDER — GI COCKTAIL ~~LOC~~
30.0000 mL | Freq: Once | ORAL | Status: AC
  Administered 2017-08-06: 30 mL via ORAL
  Filled 2017-08-06: qty 30

## 2017-08-06 MED ORDER — FAMOTIDINE IN NACL 20-0.9 MG/50ML-% IV SOLN
20.0000 mg | Freq: Once | INTRAVENOUS | Status: AC
  Administered 2017-08-06: 20 mg via INTRAVENOUS
  Filled 2017-08-06: qty 50
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
## 2017-08-21 ENCOUNTER — Ambulatory Visit (INDEPENDENT_AMBULATORY_CARE_PROVIDER_SITE_OTHER): Payer: Medicaid Other | Admitting: Internal Medicine

## 2017-08-21 ENCOUNTER — Other Ambulatory Visit: Payer: Self-pay

## 2017-08-21 ENCOUNTER — Telehealth: Payer: Self-pay | Admitting: *Deleted

## 2017-08-21 VITALS — BP 115/70 | HR 88 | Temp 98.0°F | Ht 70.0 in | Wt 148.5 lb

## 2017-08-21 DIAGNOSIS — F1099 Alcohol use, unspecified with unspecified alcohol-induced disorder: Secondary | ICD-10-CM | POA: Diagnosis not present

## 2017-08-21 DIAGNOSIS — K2980 Duodenitis without bleeding: Secondary | ICD-10-CM

## 2017-08-21 DIAGNOSIS — K7010 Alcoholic hepatitis without ascites: Secondary | ICD-10-CM

## 2017-08-21 DIAGNOSIS — Z5900 Homelessness unspecified: Secondary | ICD-10-CM

## 2017-08-21 DIAGNOSIS — Z79899 Other long term (current) drug therapy: Secondary | ICD-10-CM | POA: Diagnosis not present

## 2017-08-21 DIAGNOSIS — K648 Other hemorrhoids: Secondary | ICD-10-CM

## 2017-08-21 NOTE — Assessment & Plan Note (Signed)
Patient is here for follow up of his chronic abdominal pain. He has alcoholic hepatitis diagnosed during recently hospitalization last month.  CT abdomen and pelvis revealed diffuse fatty infiltration of the liver, without changes suggestive of cirrhosis or worrisome hepatic lesions.  He also underwent colonoscopy and EGD during hospitalization. Found to have duodenitis and internal hemorrhoids. He was discharged on a PPI and hydrocortisone suppository. He has been unable to afford his medications.  Currently is only taking metoprolol 100 mg daily for his blood pressure.  He is homeless and has been unable to receive his social security checks. He continues to drink and have daily symptoms of abdominal pain, nausea, and vomiting. Multiple recent ED visits for similar complaints.  Recent labs with persistent transaminitis, elevated alk phos, and elevated T bili.  His renal function is normal. Reports he has "cut back" to 1-2 beers a day. Advised complete cessation. We are working to address his social issues in order for him to afford his medications. Naltrexone may be a good option in the future however we are currently limited due to his financial and social issues.  --Advised alcohol cessation
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
## 2017-08-21 NOTE — Patient Instructions (Signed)
FOLLOW-UP INSTRUCTIONS When: PCP next available For: Follow up What to bring: Medications  [name],  It was a pleasure to see you. I am sorry to hear about your medical issues and trouble with your finances. I have provided you with a letter that will hopefully help you receive your own social security checks. Please make an appointment to follow up with your primary care doctor at your earliest convenience. If you have any questions or concerns, call our clinic at [phone] or after hours call [phone] and ask for the internal medicine resident on call. Thank you!  - Dr. Philipp Ovens
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
## 2017-08-22 NOTE — Progress Notes (Signed)
Internal Medicine Clinic Attending  Case discussed with Dr. Guilloud at the time of the visit.  We reviewed the resident's history and exam and pertinent patient test results.  I agree with the assessment, diagnosis, and plan of care documented in the resident's note.  

## 2017-10-10 ENCOUNTER — Encounter (HOSPITAL_COMMUNITY): Payer: Self-pay | Admitting: Emergency Medicine

## 2017-10-10 ENCOUNTER — Emergency Department (HOSPITAL_COMMUNITY)
Admission: EM | Admit: 2017-10-10 | Discharge: 2017-10-10 | Disposition: A | Discharge status: Home / Self Care | Payer: Medicaid Other | Attending: Emergency Medicine | Admitting: Emergency Medicine

## 2017-10-10 DIAGNOSIS — Z7982 Long term (current) use of aspirin: Secondary | ICD-10-CM | POA: Diagnosis not present

## 2017-10-10 DIAGNOSIS — Z79899 Other long term (current) drug therapy: Secondary | ICD-10-CM | POA: Diagnosis not present

## 2017-10-10 DIAGNOSIS — R109 Unspecified abdominal pain: Secondary | ICD-10-CM | POA: Diagnosis present

## 2017-10-10 DIAGNOSIS — R1013 Epigastric pain: Secondary | ICD-10-CM

## 2017-10-10 DIAGNOSIS — F17210 Nicotine dependence, cigarettes, uncomplicated: Secondary | ICD-10-CM | POA: Diagnosis not present

## 2017-10-10 DIAGNOSIS — I10 Essential (primary) hypertension: Secondary | ICD-10-CM | POA: Insufficient documentation

## 2017-10-10 LAB — COMPREHENSIVE METABOLIC PANEL
ALBUMIN: 3.2 g/dL — AB (ref 3.5–5.0)
ALT: 66 U/L — AB (ref 17–63)
AST: 145 U/L — AB (ref 15–41)
Alkaline Phosphatase: 486 U/L — ABNORMAL HIGH (ref 38–126)
Anion gap: 12 (ref 5–15)
BUN: 7 mg/dL (ref 6–20)
CHLORIDE: 104 mmol/L (ref 101–111)
CO2: 22 mmol/L (ref 22–32)
CREATININE: 0.68 mg/dL (ref 0.61–1.24)
Calcium: 9.2 mg/dL (ref 8.9–10.3)
GFR calc Af Amer: >60 mL/min (ref >60)
GLUCOSE: 101 mg/dL — AB (ref 65–99)
POTASSIUM: 3.6 mmol/L (ref 3.5–5.1)
SODIUM: 138 mmol/L (ref 135–145)
Total Bilirubin: 1.6 mg/dL — ABNORMAL HIGH (ref 0.3–1.2)
Total Protein: 8.1 g/dL (ref 6.5–8.1)

## 2017-10-10 LAB — CBC
HEMATOCRIT: 35.4 % — AB (ref 39.0–52.0)
Hemoglobin: 11.5 g/dL — ABNORMAL LOW (ref 13.0–17.0)
MCH: 34.7 pg — AB (ref 26.0–34.0)
MCHC: 32.5 g/dL (ref 30.0–36.0)
MCV: 106.9 fL — AB (ref 78.0–100.0)
Platelets: 235 10*3/uL (ref 150–400)
RBC: 3.31 MIL/uL — ABNORMAL LOW (ref 4.22–5.81)
RDW: 13.7 % (ref 11.5–15.5)
WBC: 5.5 10*3/uL (ref 4.0–10.5)

## 2017-10-10 LAB — URINALYSIS, ROUTINE W REFLEX MICROSCOPIC
Bilirubin Urine: NEGATIVE
GLUCOSE, UA: NEGATIVE mg/dL
HGB URINE DIPSTICK: NEGATIVE
Ketones, ur: NEGATIVE mg/dL
Leukocytes, UA: NEGATIVE
Nitrite: NEGATIVE
PH: 6 (ref 5.0–8.0)
Protein, ur: NEGATIVE mg/dL
Specific Gravity, Urine: 1.004 — ABNORMAL LOW (ref 1.005–1.030)

## 2017-10-10 LAB — LIPASE, BLOOD: Lipase: 31 U/L (ref 11–51)

## 2017-10-10 MED ORDER — ONDANSETRON 4 MG PO TBDP
4.0000 mg | ORAL_TABLET | Freq: Once | ORAL | Status: AC
  Administered 2017-10-10: 4 mg via ORAL
  Filled 2017-10-10: qty 1

## 2017-10-10 MED ORDER — GI COCKTAIL ~~LOC~~
30.0000 mL | Freq: Once | ORAL | Status: AC
  Administered 2017-10-10: 30 mL via ORAL
  Filled 2017-10-10: qty 30

## 2017-10-10 MED ORDER — FAMOTIDINE 20 MG PO TABS
20.0000 mg | ORAL_TABLET | Freq: Once | ORAL | Status: AC
  Administered 2017-10-10: 20 mg via ORAL
  Filled 2017-10-10: qty 1

## 2017-10-10 NOTE — ED Triage Notes (Signed)
BIB EMS from [facility]... Pt reports gen abd pain, N/V/D X2 days. Pt has hx of alcoholic gastritis and cirrhosis.

## 2017-10-10 NOTE — ED Provider Notes (Signed)
Patient placed in Quick Look pathway, seen and evaluated   Chief Complaint: Abdominal pain   HPI:   57 y.o. brought in by EMS for abdominal pain that is been ongoing for last 2 days.  Patient reports that he has had episodes of nausea and vomiting over the last few days.  He states he has noticed small blood spots in his vomiting but no gross hematemesis.  Patient is also had diarrhea.  No black or tarry stools.  He has noticed some bright red spots on toilet.  He reports his abdominal pain is worse in the right upper quadrant.  He reports his last drink was yesterday.  Denies any fevers, chest pain, difficulty breathing.  ROS: Abdominal pain   Physical Exam:   Gen: No distress  Neuro: Awake and Alert  Skin: Warm    Focused Exam: Diffuse tenderness palpation noted abdomen is focal point in the right upper quadrant.  No rigidity, guarding.   Initiation of care has begun. The patient has been counseled on the process, plan, and necessity for staying for the completion/evaluation, and the remainder of the medical screening examination    Desma Mcgregor 10/10/17 2027    Lacretia Leigh, MD 10/10/17 2309

## 2017-10-10 NOTE — Discharge Instructions (Addendum)
These return for any problem.  Follow-up with your regular doctor as instructed.  Please attempt to drink alcohol in moderation.
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
## 2017-10-10 NOTE — ED Notes (Addendum)
Pt's name called for a room no answer x1

## 2017-10-19 ENCOUNTER — Emergency Department (HOSPITAL_COMMUNITY)
Admission: EM | Admit: 2017-10-19 | Discharge: 2017-10-20 | Disposition: A | Discharge status: Home / Self Care | Payer: Medicaid Other | Attending: Emergency Medicine | Admitting: Emergency Medicine

## 2017-10-19 ENCOUNTER — Other Ambulatory Visit: Payer: Self-pay

## 2017-10-19 DIAGNOSIS — Z7982 Long term (current) use of aspirin: Secondary | ICD-10-CM | POA: Diagnosis not present

## 2017-10-19 DIAGNOSIS — Z79899 Other long term (current) drug therapy: Secondary | ICD-10-CM | POA: Insufficient documentation

## 2017-10-19 DIAGNOSIS — I10 Essential (primary) hypertension: Secondary | ICD-10-CM | POA: Insufficient documentation

## 2017-10-19 DIAGNOSIS — F17210 Nicotine dependence, cigarettes, uncomplicated: Secondary | ICD-10-CM | POA: Insufficient documentation

## 2017-10-19 DIAGNOSIS — F10921 Alcohol use, unspecified with intoxication delirium: Secondary | ICD-10-CM | POA: Diagnosis not present

## 2017-10-19 DIAGNOSIS — R55 Syncope and collapse: Secondary | ICD-10-CM

## 2017-10-19 DIAGNOSIS — F10929 Alcohol use, unspecified with intoxication, unspecified: Secondary | ICD-10-CM | POA: Diagnosis present

## 2017-10-19 LAB — I-STAT TROPONIN, ED: TROPONIN I, POC: 0 ng/mL (ref 0.00–0.08)

## 2017-10-19 MED ORDER — SODIUM CHLORIDE 0.9 % IV BOLUS
1000.0000 mL | Freq: Once | INTRAVENOUS | Status: AC
  Administered 2017-10-19: 1000 mL via INTRAVENOUS
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
## 2017-10-19 NOTE — ED Notes (Signed)
C-collar removed by MD

## 2017-10-20 LAB — CBC WITH DIFFERENTIAL/PLATELET
Abs Immature Granulocytes: 0 10*3/uL (ref 0.0–0.1)
BASOS PCT: 1 %
Basophils Absolute: 0 10*3/uL (ref 0.0–0.1)
EOS ABS: 0.1 10*3/uL (ref 0.0–0.7)
EOS PCT: 1 %
HEMATOCRIT: 34.5 % — AB (ref 39.0–52.0)
Hemoglobin: 11.3 g/dL — ABNORMAL LOW (ref 13.0–17.0)
IMMATURE GRANULOCYTES: 1 %
LYMPHS ABS: 1.6 10*3/uL (ref 0.7–4.0)
Lymphocytes Relative: 26 %
MCH: 34.5 pg — AB (ref 26.0–34.0)
MCHC: 32.8 g/dL (ref 30.0–36.0)
MCV: 105.2 fL — AB (ref 78.0–100.0)
MONO ABS: 0.6 10*3/uL (ref 0.1–1.0)
Monocytes Relative: 10 %
Neutro Abs: 4 10*3/uL (ref 1.7–7.7)
Neutrophils Relative %: 63 %
PLATELETS: 161 10*3/uL (ref 150–400)
RBC: 3.28 MIL/uL — ABNORMAL LOW (ref 4.22–5.81)
RDW: 12.7 % (ref 11.5–15.5)
WBC: 6.4 10*3/uL (ref 4.0–10.5)

## 2017-10-20 LAB — I-STAT TROPONIN, ED: TROPONIN I, POC: 0 ng/mL (ref 0.00–0.08)

## 2017-10-20 LAB — COMPREHENSIVE METABOLIC PANEL
ALT: 73 U/L — ABNORMAL HIGH (ref 17–63)
AST: 154 U/L — ABNORMAL HIGH (ref 15–41)
Albumin: 3 g/dL — ABNORMAL LOW (ref 3.5–5.0)
Alkaline Phosphatase: 537 U/L — ABNORMAL HIGH (ref 38–126)
Anion gap: 10 (ref 5–15)
BILIRUBIN TOTAL: 1.1 mg/dL (ref 0.3–1.2)
BUN: 10 mg/dL (ref 6–20)
CO2: 22 mmol/L (ref 22–32)
CREATININE: 0.65 mg/dL (ref 0.61–1.24)
Calcium: 8.6 mg/dL — ABNORMAL LOW (ref 8.9–10.3)
Chloride: 107 mmol/L (ref 101–111)
Glucose, Bld: 92 mg/dL (ref 65–99)
POTASSIUM: 3.6 mmol/L (ref 3.5–5.1)
Sodium: 139 mmol/L (ref 135–145)
TOTAL PROTEIN: 7.6 g/dL (ref 6.5–8.1)

## 2017-10-20 LAB — ETHANOL: ALCOHOL ETHYL (B): 354 mg/dL — AB (ref <10)

## 2017-10-20 MED ORDER — CHLORDIAZEPOXIDE HCL 25 MG PO CAPS: ORAL_CAPSULE | ORAL | 0 refills | Status: DC

## 2017-10-20 MED ORDER — GI COCKTAIL ~~LOC~~
30.0000 mL | Freq: Once | ORAL | Status: AC
  Administered 2017-10-20: 30 mL via ORAL
  Filled 2017-10-20: qty 30
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
## 2017-10-20 NOTE — ED Notes (Signed)
Pt removed his IV, IV intact

## 2017-10-20 NOTE — ED Provider Notes (Signed)
I received the patient in signout from Dr. Jeneen Rinks.  Briefly patient is a 57 year old male with a chief complaint of loss of consciousness.  Patient was clinically intoxicated though because the patient was unable to provide a significant history he had a laboratory evaluation.  His alcohol level was found to be 354.  The plan is for a delta troponin, and discharged home once sober.  Reassessed once sober, complaining more of epigastric abdominal pain than chest pain.  No tenderness on exam.  Delta trop negative, given GI cocktail with some improvement.  D/c home.    Deno Etienne, DO 10/22/17 1005

## 2017-10-20 NOTE — ED Notes (Signed)
Pt ambulatory with steady and strong gait; denies any dizziness

## 2017-10-20 NOTE — ED Notes (Signed)
Nurse currently drawing labs 

## 2017-10-25 ENCOUNTER — Other Ambulatory Visit: Payer: Self-pay

## 2017-10-26 ENCOUNTER — Emergency Department (HOSPITAL_COMMUNITY)
Admission: EM | Admit: 2017-10-26 | Discharge: 2017-10-27 | Disposition: A | Discharge status: Home / Self Care | Payer: Medicaid Other | Attending: Emergency Medicine | Admitting: Emergency Medicine

## 2017-10-26 ENCOUNTER — Encounter (HOSPITAL_COMMUNITY): Payer: Self-pay | Admitting: Emergency Medicine

## 2017-10-26 DIAGNOSIS — R531 Weakness: Secondary | ICD-10-CM | POA: Diagnosis present

## 2017-10-26 DIAGNOSIS — R740 Nonspecific elevation of levels of transaminase and lactic acid dehydrogenase [LDH]: Secondary | ICD-10-CM | POA: Insufficient documentation

## 2017-10-26 DIAGNOSIS — F17210 Nicotine dependence, cigarettes, uncomplicated: Secondary | ICD-10-CM | POA: Diagnosis not present

## 2017-10-26 DIAGNOSIS — R7401 Elevation of levels of liver transaminase levels: Secondary | ICD-10-CM

## 2017-10-26 DIAGNOSIS — I10 Essential (primary) hypertension: Secondary | ICD-10-CM | POA: Insufficient documentation

## 2017-10-26 DIAGNOSIS — R55 Syncope and collapse: Secondary | ICD-10-CM | POA: Insufficient documentation

## 2017-10-26 DIAGNOSIS — Z7982 Long term (current) use of aspirin: Secondary | ICD-10-CM | POA: Diagnosis not present

## 2017-10-26 DIAGNOSIS — Z79899 Other long term (current) drug therapy: Secondary | ICD-10-CM | POA: Insufficient documentation

## 2017-10-26 LAB — URINALYSIS, ROUTINE W REFLEX MICROSCOPIC
Bilirubin Urine: NEGATIVE
Glucose, UA: NEGATIVE mg/dL
Hgb urine dipstick: NEGATIVE
Ketones, ur: NEGATIVE mg/dL
LEUKOCYTES UA: NEGATIVE
Nitrite: NEGATIVE
PROTEIN: NEGATIVE mg/dL
Specific Gravity, Urine: 1.017 (ref 1.005–1.030)
pH: 5 (ref 5.0–8.0)

## 2017-10-26 LAB — COMPREHENSIVE METABOLIC PANEL
ALT: 145 U/L — ABNORMAL HIGH (ref 0–44)
AST: 453 U/L — AB (ref 15–41)
Albumin: 2.8 g/dL — ABNORMAL LOW (ref 3.5–5.0)
Alkaline Phosphatase: 683 U/L — ABNORMAL HIGH (ref 38–126)
Anion gap: 11 (ref 5–15)
BUN: 10 mg/dL (ref 6–20)
CHLORIDE: 101 mmol/L (ref 98–111)
CO2: 22 mmol/L (ref 22–32)
Calcium: 8.9 mg/dL (ref 8.9–10.3)
Creatinine, Ser: 1.04 mg/dL (ref 0.61–1.24)
GFR calc Af Amer: >60 mL/min (ref >60)
Glucose, Bld: 93 mg/dL (ref 70–99)
POTASSIUM: 4 mmol/L (ref 3.5–5.1)
SODIUM: 134 mmol/L — AB (ref 135–145)
Total Bilirubin: 2.3 mg/dL — ABNORMAL HIGH (ref 0.3–1.2)
Total Protein: 7.1 g/dL (ref 6.5–8.1)

## 2017-10-26 LAB — CBC WITH DIFFERENTIAL/PLATELET
Abs Immature Granulocytes: 0 10*3/uL (ref 0.0–0.1)
Basophils Absolute: 0 10*3/uL (ref 0.0–0.1)
Basophils Relative: 0 %
EOS PCT: 1 %
Eosinophils Absolute: 0 10*3/uL (ref 0.0–0.7)
HEMATOCRIT: 31.3 % — AB (ref 39.0–52.0)
HEMOGLOBIN: 11 g/dL — AB (ref 13.0–17.0)
IMMATURE GRANULOCYTES: 0 %
LYMPHS ABS: 1.2 10*3/uL (ref 0.7–4.0)
LYMPHS PCT: 26 %
MCH: 35.3 pg — ABNORMAL HIGH (ref 26.0–34.0)
MCHC: 35.1 g/dL (ref 30.0–36.0)
MCV: 100.3 fL — ABNORMAL HIGH (ref 78.0–100.0)
Monocytes Absolute: 0.5 10*3/uL (ref 0.1–1.0)
Monocytes Relative: 10 %
Neutro Abs: 2.9 10*3/uL (ref 1.7–7.7)
Neutrophils Relative %: 63 %
Platelets: 154 10*3/uL (ref 150–400)
RBC: 3.12 MIL/uL — AB (ref 4.22–5.81)
RDW: 12.9 % (ref 11.5–15.5)
WBC: 4.6 10*3/uL (ref 4.0–10.5)

## 2017-10-26 LAB — I-STAT TROPONIN, ED: Troponin i, poc: 0 ng/mL (ref 0.00–0.08)

## 2017-10-26 LAB — ETHANOL: ALCOHOL ETHYL (B): 144 mg/dL — AB (ref <10)

## 2017-10-26 MED ORDER — ONDANSETRON 4 MG PO TBDP
4.0000 mg | ORAL_TABLET | Freq: Once | ORAL | Status: AC
  Administered 2017-10-26: 4 mg via ORAL
  Filled 2017-10-26: qty 1

## 2017-10-26 NOTE — ED Triage Notes (Signed)
Brought by ems for c/o not feeling well.  Endorses nausea and diarrhea.  Reports he walked a lot today and drank some water burt hasn't eaten much.  Reports 3 stools.

## 2017-10-26 NOTE — ED Provider Notes (Signed)
MSE was initiated and I personally evaluated the patient and placed orders (if any) at  7:50 PM on October 26, 2017.  The patient appears stable so that the remainder of the MSE may be completed by another provider.  Patient placed in Quick Look pathway, seen and evaluated   Chief Complaint: Near syncope, generalized weakness, abdominal pain  HPI:   Pt picked up by police after found laying on ground. Pt reports he felt like passing out and lowered himself to the ground. No syncope. Did not hit his head. Pt has been walking in the heat all day. Pt also c/o generalized abdominal pain with nausea for the past week.   ROS: See HPI  Physical Exam:   Gen: No distress  Neuro: Awake and Alert  Skin: Warm    Focused Exam: Slight tachycardia 103. Lunsg CTA. Abdomen soft w/ generalized discomfort.    Initiation of care has begun. The patient has been counseled on the process, plan, and necessity for staying for the completion/evaluation, and the remainder of the medical screening examination    Tamala Julian 10/26/17 2036    Jola Schmidt, MD 10/27/17 [phone]

## 2017-10-27 MED ORDER — CHLORDIAZEPOXIDE HCL 25 MG PO CAPS: ORAL_CAPSULE | ORAL | 0 refills | Status: DC

## 2017-10-27 MED ORDER — METOPROLOL SUCCINATE ER 100 MG PO TB24: 100.0000 mg | ORAL_TABLET | Freq: Every day | ORAL | 0 refills | Status: DC

## 2017-10-27 MED ORDER — OMEPRAZOLE 20 MG PO CPDR: 20.0000 mg | DELAYED_RELEASE_CAPSULE | Freq: Every day | ORAL | 0 refills | Status: DC
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
## 2017-10-27 NOTE — Discharge Instructions (Addendum)
You are seen in the ER for dizziness.  Your labs show that your liver function tests are getting worse.  This is likely because of alcohol use. Please see the [place] doctor for further help. Please refrain from alcohol use. Take the medications prescribed to help with alcohol withdrawal.  Do not mix this medication with alcohol, the combination could be deadly. Resources have been provided for alcohol rehab.  Substance Abuse Treatment Programs  Intensive Outpatient Programs [facility]     [address], [place]       [facility] [address] [place], [place]  [facility]     (Inpatient and outpatient)     [address]           [place] [phone] (Suboxone and Methadone)  [place], [place] [address]      [place] [address] [place], [place]  [facility] (Outpatient/Inpatient, [place])    (insurance only) [phone]             [facility] ([place]) [place], [place]     [facility]     [address]     [place], [place]       [facility] (for caregivers and family) [phone] [address] [place]een [place]. [place], [place]      Residential Treatment Programs [facility]      [address], [place], [place] [address]  [phone]       [address] [place], [place] [address] [phone]  [facility] [place]        [phone]       [facility] [phone]  [facility] ([place].)             [place], [place] or [place] of [place] [address] [place],  [address] [phone]  [facility] [place]    [address]      [place], [place]       [facility] [address] [place], [place]  [place]   [address] [place], [place] [address]     [phone]      Admissions: 8am-3pm M-F  [facility] (RTS) [address] [place], [place]  [facility] Program: Residential Program [phone] Days)   [place], [place]  [phone] or [phone]     [facility]: [place], [place] (Walk in Hours over the weekend or by referral)  [facility] [place], [place], [place] [address] [phone]  Crisis Mobile: [facility]:  [phone] (for crisis response 24 hours a day) [facility] Hotline:      [phone] Outpatient Psychiatry and Counseling  [facility]: Mobile Crisis Management 24 hours:  [phone]  [facility] of the [employer] sliding scale fee and walk in schedule: M-F 8am-12pm/1pm-3pm [place], [place] [address] [address] [place], [place] [address] [phone]  [facility] (Formerly known as [facility] [employer])- new patient walk-in appointments available Monday - Friday 8am -3pm.          [address] [place], [place] [address] [phone] or crisis line- [facility]/ Intensive Outpatient Therapy Program [place], [place] [address] [place]      [phone] [address], [place] [address]                 [place]   [facility] [phone]. [address] [place], [place] [address]   [facility] of Care          [address] Johnette Abraham   [place], [place] [address]       [phone]  [facility] [address], [place], [place] [address] [phone]  [facility]    [address] [place], [place] [address]     [place], [place] Joycelyn Man     [place] [address]     [phone]       [facility] [place] [address]  [facility]     [address], [place], [place], [place] [address] [place]     [address]     [place], [place] [address]     [phone]       [facility] [address] [place], [place] [address] [phone] Resources for Temporary Residential Assistance/Crisis [facility]) M-F 8am-3pm   [address], [place] [address]   [phone] Services include: laundry, barbering, support groups, case management, phone  & computer access, showers, AA/NA mtgs, mental health/substance abuse nurse, job skills class, disability information, VA assistance, spiritual classes, etc.   [facility]   [address], [place]     [place]              [employer] (women and children)       [place]. [place],  [address] [phone] [email or web address] for application and process Application Required  [facility]   [address]    [place] [address]  [place], [place] [address] [phone]([email or web address] application appt.) Application Required  [facility] (women only)    [address]     [place], [place]  [address]     [phone]      Intake starts 6pm daily Need valid ID, SSC, & Police report [employer] [address] [place], [place] [phone] Application Required  [employer] (men only)     [place].      [place], [place]       [place] (Pregnant women only) [address] [place], [place]  [facility]      [place] [address].      [place], [place] [address]     [phone]             [facility] [address] [place], [place] 90 day commitment/SA/Application process  [facility](men only)     [address]     [place], [place]       Check-in at [facility] of [facility] [address] [place], [place] [address] [phone] Men/Women/Women and Children must be there by 7 pm  [place], [place]

## 2017-11-11 ENCOUNTER — Emergency Department (HOSPITAL_COMMUNITY): Payer: Medicaid Other

## 2017-11-11 ENCOUNTER — Emergency Department (HOSPITAL_COMMUNITY)
Admission: EM | Admit: 2017-11-11 | Discharge: 2017-11-11 | Disposition: A | Discharge status: Home / Self Care | Payer: Medicaid Other | Attending: Emergency Medicine | Admitting: Emergency Medicine

## 2017-11-11 ENCOUNTER — Other Ambulatory Visit: Payer: Self-pay

## 2017-11-11 ENCOUNTER — Encounter (HOSPITAL_COMMUNITY): Payer: Self-pay | Admitting: Pharmacy Technician

## 2017-11-11 DIAGNOSIS — Z79899 Other long term (current) drug therapy: Secondary | ICD-10-CM | POA: Insufficient documentation

## 2017-11-11 DIAGNOSIS — F17210 Nicotine dependence, cigarettes, uncomplicated: Secondary | ICD-10-CM | POA: Insufficient documentation

## 2017-11-11 DIAGNOSIS — R079 Chest pain, unspecified: Secondary | ICD-10-CM

## 2017-11-11 DIAGNOSIS — Z7982 Long term (current) use of aspirin: Secondary | ICD-10-CM | POA: Diagnosis not present

## 2017-11-11 DIAGNOSIS — R109 Unspecified abdominal pain: Secondary | ICD-10-CM | POA: Diagnosis not present

## 2017-11-11 DIAGNOSIS — I119 Hypertensive heart disease without heart failure: Secondary | ICD-10-CM | POA: Insufficient documentation

## 2017-11-11 LAB — BASIC METABOLIC PANEL
Anion gap: 11 (ref 5–15)
BUN: 13 mg/dL (ref 6–20)
CO2: 20 mmol/L — ABNORMAL LOW (ref 22–32)
Calcium: 8.5 mg/dL — ABNORMAL LOW (ref 8.9–10.3)
Chloride: 101 mmol/L (ref 98–111)
Creatinine, Ser: 0.58 mg/dL — ABNORMAL LOW (ref 0.61–1.24)
GFR calc Af Amer: >60 mL/min (ref >60)
GFR calc non Af Amer: >60 mL/min (ref >60)
Glucose, Bld: 100 mg/dL — ABNORMAL HIGH (ref 70–99)
Potassium: 4.6 mmol/L (ref 3.5–5.1)
Sodium: 132 mmol/L — ABNORMAL LOW (ref 135–145)

## 2017-11-11 LAB — CBC
HCT: 31.3 % — ABNORMAL LOW (ref 39.0–52.0)
Hemoglobin: 10.9 g/dL — ABNORMAL LOW (ref 13.0–17.0)
MCH: 35.9 pg — ABNORMAL HIGH (ref 26.0–34.0)
MCHC: 34.8 g/dL (ref 30.0–36.0)
MCV: 103 fL — ABNORMAL HIGH (ref 78.0–100.0)
Platelets: 183 10*3/uL (ref 150–400)
RBC: 3.04 MIL/uL — ABNORMAL LOW (ref 4.22–5.81)
RDW: 14.2 % (ref 11.5–15.5)
WBC: 5.4 10*3/uL (ref 4.0–10.5)

## 2017-11-11 LAB — I-STAT TROPONIN, ED: Troponin i, poc: 0 ng/mL (ref 0.00–0.08)

## 2017-11-11 MED ORDER — HYDROMORPHONE HCL 1 MG/ML IJ SOLN
1.0000 mg | Freq: Once | INTRAMUSCULAR | Status: AC
  Administered 2017-11-11: 1 mg via INTRAVENOUS
  Filled 2017-11-11: qty 1

## 2017-11-11 MED ORDER — ONDANSETRON HCL 4 MG/2ML IJ SOLN
4.0000 mg | Freq: Once | INTRAMUSCULAR | Status: AC
  Administered 2017-11-11: 4 mg via INTRAVENOUS
  Filled 2017-11-11: qty 2

## 2017-11-11 NOTE — ED Triage Notes (Signed)
Pt bib EMS sitting outside and approx 1330 pt started with abd pain cp. CP sharp stabby down L arm sudden onset 9/10 no interventions improved pain. 324mg  asa and 0.4mg  SL nitro X2 given by ems. Abd distended upon arrival.  18g LAC.130/90, HR 112, 100% RA, RR 18.

## 2017-11-11 NOTE — ED Notes (Signed)
Pt returned from xray

## 2017-11-11 NOTE — ED Notes (Signed)
EDP at bedside  

## 2017-11-11 NOTE — ED Notes (Signed)
Pt stable, ambulatory, states understanding of discharge instructions 

## 2017-11-17 ENCOUNTER — Encounter (HOSPITAL_COMMUNITY): Payer: Self-pay

## 2017-11-17 ENCOUNTER — Emergency Department (HOSPITAL_COMMUNITY)
Admission: EM | Admit: 2017-11-17 | Discharge: 2017-11-18 | Disposition: A | Discharge status: Home / Self Care | Payer: Medicaid Other | Attending: Emergency Medicine | Admitting: Emergency Medicine

## 2017-11-17 ENCOUNTER — Other Ambulatory Visit: Payer: Self-pay

## 2017-11-17 ENCOUNTER — Emergency Department (HOSPITAL_COMMUNITY): Payer: Medicaid Other

## 2017-11-17 DIAGNOSIS — I252 Old myocardial infarction: Secondary | ICD-10-CM | POA: Insufficient documentation

## 2017-11-17 DIAGNOSIS — F17210 Nicotine dependence, cigarettes, uncomplicated: Secondary | ICD-10-CM | POA: Diagnosis not present

## 2017-11-17 DIAGNOSIS — Z7982 Long term (current) use of aspirin: Secondary | ICD-10-CM | POA: Insufficient documentation

## 2017-11-17 DIAGNOSIS — I10 Essential (primary) hypertension: Secondary | ICD-10-CM | POA: Diagnosis not present

## 2017-11-17 DIAGNOSIS — R079 Chest pain, unspecified: Secondary | ICD-10-CM | POA: Insufficient documentation

## 2017-11-17 DIAGNOSIS — R1010 Upper abdominal pain, unspecified: Secondary | ICD-10-CM | POA: Insufficient documentation

## 2017-11-17 DIAGNOSIS — I119 Hypertensive heart disease without heart failure: Secondary | ICD-10-CM | POA: Diagnosis not present

## 2017-11-17 DIAGNOSIS — Z79899 Other long term (current) drug therapy: Secondary | ICD-10-CM | POA: Insufficient documentation

## 2017-11-17 DIAGNOSIS — R109 Unspecified abdominal pain: Secondary | ICD-10-CM

## 2017-11-17 LAB — URINALYSIS, ROUTINE W REFLEX MICROSCOPIC
BACTERIA UA: NONE SEEN
BILIRUBIN URINE: NEGATIVE
Glucose, UA: NEGATIVE mg/dL
Ketones, ur: NEGATIVE mg/dL
LEUKOCYTES UA: NEGATIVE
Nitrite: NEGATIVE
Protein, ur: NEGATIVE mg/dL
SPECIFIC GRAVITY, URINE: 1.002 — AB (ref 1.005–1.030)
pH: 6 (ref 5.0–8.0)

## 2017-11-17 LAB — CBC
HEMATOCRIT: 33.6 % — AB (ref 39.0–52.0)
Hemoglobin: 11.2 g/dL — ABNORMAL LOW (ref 13.0–17.0)
MCH: 33.2 pg (ref 26.0–34.0)
MCHC: 33.3 g/dL (ref 30.0–36.0)
MCV: 99.7 fL (ref 78.0–100.0)
Platelets: 176 10*3/uL (ref 150–400)
RBC: 3.37 MIL/uL — ABNORMAL LOW (ref 4.22–5.81)
RDW: 14 % (ref 11.5–15.5)
WBC: 7.6 10*3/uL (ref 4.0–10.5)

## 2017-11-17 LAB — COMPREHENSIVE METABOLIC PANEL
ALT: 43 U/L (ref 0–44)
AST: 105 U/L — ABNORMAL HIGH (ref 15–41)
Albumin: 3 g/dL — ABNORMAL LOW (ref 3.5–5.0)
Alkaline Phosphatase: 496 U/L — ABNORMAL HIGH (ref 38–126)
Anion gap: 12 (ref 5–15)
BUN: 6 mg/dL (ref 6–20)
CALCIUM: 8.9 mg/dL (ref 8.9–10.3)
CO2: 21 mmol/L — ABNORMAL LOW (ref 22–32)
Chloride: 100 mmol/L (ref 98–111)
Creatinine, Ser: 0.61 mg/dL (ref 0.61–1.24)
GFR calc Af Amer: >60 mL/min (ref >60)
GLUCOSE: 94 mg/dL (ref 70–99)
Potassium: 3.5 mmol/L (ref 3.5–5.1)
Sodium: 133 mmol/L — ABNORMAL LOW (ref 135–145)
TOTAL PROTEIN: 8 g/dL (ref 6.5–8.1)
Total Bilirubin: 1.7 mg/dL — ABNORMAL HIGH (ref 0.3–1.2)

## 2017-11-17 LAB — I-STAT TROPONIN, ED: Troponin i, poc: 0 ng/mL (ref 0.00–0.08)

## 2017-11-17 NOTE — ED Triage Notes (Signed)
Onset today chest pain, abd pain, vomiting x 2.

## 2017-11-18 ENCOUNTER — Emergency Department (HOSPITAL_COMMUNITY): Payer: Medicaid Other

## 2017-11-18 MED ORDER — GI COCKTAIL ~~LOC~~
30.0000 mL | Freq: Once | ORAL | Status: AC
  Administered 2017-11-18: 30 mL via ORAL
  Filled 2017-11-18: qty 30

## 2017-11-18 MED ORDER — IOHEXOL 300 MG/ML  SOLN
100.0000 mL | Freq: Once | INTRAMUSCULAR | Status: AC | PRN
  Administered 2017-11-18: 100 mL via INTRAVENOUS

## 2017-11-18 MED ORDER — DICYCLOMINE HCL 20 MG PO TABS: 20.0000 mg | ORAL_TABLET | Freq: Two times a day (BID) | ORAL | 0 refills | Status: DC

## 2017-11-18 MED ORDER — ONDANSETRON 4 MG PO TBDP
8.0000 mg | ORAL_TABLET | Freq: Once | ORAL | Status: AC
  Administered 2017-11-18: 8 mg via ORAL
  Filled 2017-11-18: qty 2

## 2017-11-18 MED ORDER — SUCRALFATE 1 GM/10ML PO SUSP: 1.0000 g | Freq: Three times a day (TID) | ORAL | 0 refills | Status: DC

## 2017-11-18 NOTE — ED Notes (Signed)
Pt noted to be resting in bed with eyes closed NAD

## 2017-11-18 NOTE — ED Provider Notes (Signed)
  Physical Exam  BP (!) 133/97   Pulse 88   Temp 97.9 F (36.6 C) (Oral)   Resp 20   SpO2 97%   Physical Exam  Constitutional: He appears well-developed and well-nourished.  Cardiovascular: Normal rate, regular rhythm, normal heart sounds and intact distal pulses.  Pulmonary/Chest: Effort normal and breath sounds normal.  Abdominal: Soft. Bowel sounds are normal. He exhibits distension. There is tenderness in the epigastric area.  Skin: Skin is warm and intact. He is not diaphoretic. No pallor.  Nursing note and vitals reviewed.   ED Course/Procedures   Clinical Course as of Nov 19 815  Sun Nov 18, 2017  5697 Case discussed with Antonietta Breach, PA-C at shift change.   [GM]  0730 CT abdomen showed gallbladder thickening and distention in the colon from gas. There are no acute intraabdominal processes that warrant emergent intervention today.   [GM]    Clinical Course User Index [GM] Mortis, Jonelle Sports, PA-C   Procedures  MDM   Devin [name] is a 57 y.o. male with a history of alcohol use, liver disease 2/2 chronic alcohol use, esophagitis, HTN and polysubstance use who presented to the ED for abdominal pain. Patient has had a total of 4 ED visits since 09/2017 for abdominal pain and always RUQ and/or epigastric tenderness on exam. Labs consistent with chronic alcohol use and are consistent with labs from last 2 ED visits. CT abdomen ordered to assist in ruling out acute and emergent intra-abdominal processes. Gallbladder thickening and colonic distention from gas appreciated. No emergent intra-abdominal processes identified that warrant further evaluation or treatment as an inpatient today. Will prescribe medication to assist with symptom relief for abdominal pain.  Dispo: Home. After thorough clinical evaluation, this patient is determined to be medically stable and can be safely discharged with the previously mentioned treatment and/or outpatient follow-up/referral(s). At this  time, there are no other apparent medical conditions that require further screening, evaluation or treatment.          Junita Push 11/18/17 9480    Orpah Greek, MD 11/19/17 [phone]

## 2017-11-18 NOTE — ED Notes (Signed)
Patient given discharge instructions and verbalized understanding.  Patient stable to discharge at this time.  Patient is alert and oriented to baseline.  No distressed noted at this time.  All belongings taken with the patient at discharge.   
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
## 2017-11-18 NOTE — Discharge Instructions (Addendum)
CT scan showed that you have gas in your colon. It also showed some thickening in your gallbladder. Please follow-up with your PCP, Dr. Shan Levans, soon to discuss your ongoing abdominal pain. I have prescribed some medication that may help with your abdominal pain.

## 2017-11-18 NOTE — ED Notes (Signed)
Pt resting VS documented 

## 2017-11-22 ENCOUNTER — Emergency Department (HOSPITAL_COMMUNITY)
Admission: EM | Admit: 2017-11-22 | Discharge: 2017-11-23 | Disposition: A | Discharge status: Home / Self Care | Payer: Medicaid Other | Attending: Emergency Medicine | Admitting: Emergency Medicine

## 2017-11-22 ENCOUNTER — Encounter (HOSPITAL_COMMUNITY): Payer: Self-pay

## 2017-11-22 DIAGNOSIS — D539 Nutritional anemia, unspecified: Secondary | ICD-10-CM | POA: Diagnosis not present

## 2017-11-22 DIAGNOSIS — Z7982 Long term (current) use of aspirin: Secondary | ICD-10-CM | POA: Diagnosis not present

## 2017-11-22 DIAGNOSIS — R945 Abnormal results of liver function studies: Secondary | ICD-10-CM

## 2017-11-22 DIAGNOSIS — R109 Unspecified abdominal pain: Secondary | ICD-10-CM

## 2017-11-22 DIAGNOSIS — I10 Essential (primary) hypertension: Secondary | ICD-10-CM | POA: Diagnosis not present

## 2017-11-22 DIAGNOSIS — F10920 Alcohol use, unspecified with intoxication, uncomplicated: Secondary | ICD-10-CM | POA: Diagnosis not present

## 2017-11-22 DIAGNOSIS — R112 Nausea with vomiting, unspecified: Secondary | ICD-10-CM

## 2017-11-22 DIAGNOSIS — Z79899 Other long term (current) drug therapy: Secondary | ICD-10-CM | POA: Diagnosis not present

## 2017-11-22 DIAGNOSIS — F17210 Nicotine dependence, cigarettes, uncomplicated: Secondary | ICD-10-CM | POA: Insufficient documentation

## 2017-11-22 DIAGNOSIS — R7989 Other specified abnormal findings of blood chemistry: Secondary | ICD-10-CM

## 2017-11-22 DIAGNOSIS — R197 Diarrhea, unspecified: Secondary | ICD-10-CM

## 2017-11-22 LAB — URINALYSIS, ROUTINE W REFLEX MICROSCOPIC
Bilirubin Urine: NEGATIVE
GLUCOSE, UA: NEGATIVE mg/dL
Hgb urine dipstick: NEGATIVE
Ketones, ur: NEGATIVE mg/dL
LEUKOCYTES UA: NEGATIVE
Nitrite: NEGATIVE
PH: 6 (ref 5.0–8.0)
PROTEIN: NEGATIVE mg/dL
SPECIFIC GRAVITY, URINE: 1.003 — AB (ref 1.005–1.030)

## 2017-11-22 LAB — COMPREHENSIVE METABOLIC PANEL
ALT: 36 U/L (ref 0–44)
AST: 93 U/L — AB (ref 15–41)
Albumin: 2.9 g/dL — ABNORMAL LOW (ref 3.5–5.0)
Alkaline Phosphatase: 437 U/L — ABNORMAL HIGH (ref 38–126)
Anion gap: 10 (ref 5–15)
BUN: 7 mg/dL (ref 6–20)
CO2: 20 mmol/L — ABNORMAL LOW (ref 22–32)
Calcium: 8.8 mg/dL — ABNORMAL LOW (ref 8.9–10.3)
Chloride: 107 mmol/L (ref 98–111)
Creatinine, Ser: 0.96 mg/dL (ref 0.61–1.24)
Glucose, Bld: 107 mg/dL — ABNORMAL HIGH (ref 70–99)
POTASSIUM: 3.5 mmol/L (ref 3.5–5.1)
Sodium: 137 mmol/L (ref 135–145)
Total Bilirubin: 1.7 mg/dL — ABNORMAL HIGH (ref 0.3–1.2)
Total Protein: 7.7 g/dL (ref 6.5–8.1)

## 2017-11-22 LAB — CBC
HEMATOCRIT: 33.9 % — AB (ref 39.0–52.0)
Hemoglobin: 11.1 g/dL — ABNORMAL LOW (ref 13.0–17.0)
MCH: 33.6 pg (ref 26.0–34.0)
MCHC: 32.7 g/dL (ref 30.0–36.0)
MCV: 102.7 fL — AB (ref 78.0–100.0)
Platelets: 220 10*3/uL (ref 150–400)
RBC: 3.3 MIL/uL — AB (ref 4.22–5.81)
RDW: 14.8 % (ref 11.5–15.5)
WBC: 6.4 10*3/uL (ref 4.0–10.5)

## 2017-11-22 LAB — ETHANOL: ALCOHOL ETHYL (B): 308 mg/dL — AB (ref <10)

## 2017-11-22 LAB — LIPASE, BLOOD: Lipase: 55 U/L — ABNORMAL HIGH (ref 11–51)

## 2017-11-22 MED ORDER — ONDANSETRON HCL 4 MG/2ML IJ SOLN
4.0000 mg | Freq: Once | INTRAMUSCULAR | Status: AC
  Administered 2017-11-23: 4 mg via INTRAVENOUS
  Filled 2017-11-22: qty 2

## 2017-11-22 MED ORDER — SODIUM CHLORIDE 0.9 % IV BOLUS
1000.0000 mL | Freq: Once | INTRAVENOUS | Status: AC
  Administered 2017-11-23: 1000 mL via INTRAVENOUS

## 2017-11-22 MED ORDER — FAMOTIDINE IN NACL 20-0.9 MG/50ML-% IV SOLN
20.0000 mg | Freq: Once | INTRAVENOUS | Status: AC
  Administered 2017-11-23: 20 mg via INTRAVENOUS
  Filled 2017-11-22: qty 50

## 2017-11-22 NOTE — ED Provider Notes (Signed)
MSE was initiated and I personally evaluated the patient and placed orders (if any) at  8:44 PM on November 22, 2017.  The patient appears stable so that the remainder of the MSE may be completed by another provider.  Patient placed in Quick Look pathway, seen and evaluated   Chief Complaint: Abdominal pain, diarrhea and vomiting  HPI:   57 year old male with a past medical history of alcohol abuse, alcoholic liver disease, esophagitis, hypertension, polysubstance abuse who presents to ED for evaluation of lower abdominal pain, 3-4 episodes of nonbloody, nonbilious emesis and 3-4 episodes of diarrhea since yesterday.  States that he also has dysuria.  No sick contacts with similar symptoms.  No suspicious food ingestions.  Patient reports eating a meal earlier today without difficulty.  ROS: Abdominal pain  Physical Exam:   Gen: No distress  Neuro: Awake and Alert  Skin: Warm    Focused Exam: Generalized tenderness to palpation with no rebound or guarding present.  Mildly tachycardic to low 100s.   Initiation of care has begun. The patient has been counseled on the process, plan, and necessity for staying for the completion/evaluation, and the remainder of the medical screening examination    Delia Heady, PA-C 11/22/17 2046    Isla Pence, MD 11/22/17 2126
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
## 2017-11-22 NOTE — ED Triage Notes (Signed)
Pt comes via [facility] from [facility] for sudden onset of abd pain, lower with vomiting x 3 and diarrhea x 2. ETOH on board.

## 2017-11-23 MED ORDER — FAMOTIDINE 20 MG PO TABS: 20.0000 mg | ORAL_TABLET | Freq: Two times a day (BID) | ORAL | 0 refills | Status: DC

## 2017-11-23 MED ORDER — ONDANSETRON HCL 4 MG PO TABS: 4.0000 mg | ORAL_TABLET | Freq: Four times a day (QID) | ORAL | 0 refills | Status: DC | PRN

## 2017-11-23 NOTE — Discharge Instructions (Addendum)
Take loperamide (Imodium AD) as needed for diarrhea.  Do not drink anything with alcohol in it!!

## 2017-11-23 NOTE — ED Notes (Signed)
Pt discharged from ED; instructions provided and scripts given; Pt encouraged to return to ED if symptoms worsen and to f/u with PCP; Pt verbalized understanding of all instructions 

## 2017-11-24 ENCOUNTER — Emergency Department (HOSPITAL_COMMUNITY)
Admission: EM | Admit: 2017-11-24 | Discharge: 2017-11-25 | Discharge status: Against Medical Advice | Payer: Medicaid Other | Attending: Emergency Medicine | Admitting: Emergency Medicine

## 2017-11-24 ENCOUNTER — Encounter (HOSPITAL_COMMUNITY): Payer: Self-pay | Admitting: *Deleted

## 2017-11-24 ENCOUNTER — Other Ambulatory Visit: Payer: Self-pay

## 2017-11-24 DIAGNOSIS — I252 Old myocardial infarction: Secondary | ICD-10-CM | POA: Diagnosis not present

## 2017-11-24 DIAGNOSIS — Z79899 Other long term (current) drug therapy: Secondary | ICD-10-CM | POA: Insufficient documentation

## 2017-11-24 DIAGNOSIS — I10 Essential (primary) hypertension: Secondary | ICD-10-CM | POA: Diagnosis not present

## 2017-11-24 DIAGNOSIS — F17210 Nicotine dependence, cigarettes, uncomplicated: Secondary | ICD-10-CM | POA: Insufficient documentation

## 2017-11-24 DIAGNOSIS — R1084 Generalized abdominal pain: Secondary | ICD-10-CM | POA: Insufficient documentation

## 2017-11-24 LAB — COMPREHENSIVE METABOLIC PANEL
ALBUMIN: 3 g/dL — AB (ref 3.5–5.0)
ALK PHOS: 457 U/L — AB (ref 38–126)
ALT: 36 U/L (ref 0–44)
ANION GAP: 11 (ref 5–15)
AST: 91 U/L — ABNORMAL HIGH (ref 15–41)
BUN: 8 mg/dL (ref 6–20)
CALCIUM: 8.9 mg/dL (ref 8.9–10.3)
CO2: 19 mmol/L — AB (ref 22–32)
Chloride: 104 mmol/L (ref 98–111)
Creatinine, Ser: 0.63 mg/dL (ref 0.61–1.24)
GFR calc Af Amer: >60 mL/min (ref >60)
GFR calc non Af Amer: >60 mL/min (ref >60)
GLUCOSE: 104 mg/dL — AB (ref 70–99)
POTASSIUM: 3.4 mmol/L — AB (ref 3.5–5.1)
SODIUM: 134 mmol/L — AB (ref 135–145)
TOTAL PROTEIN: 8 g/dL (ref 6.5–8.1)
Total Bilirubin: 1.4 mg/dL — ABNORMAL HIGH (ref 0.3–1.2)

## 2017-11-24 LAB — URINALYSIS, ROUTINE W REFLEX MICROSCOPIC
Bilirubin Urine: NEGATIVE
Glucose, UA: NEGATIVE mg/dL
Hgb urine dipstick: NEGATIVE
Ketones, ur: NEGATIVE mg/dL
Leukocytes, UA: NEGATIVE
NITRITE: NEGATIVE
PH: 6 (ref 5.0–8.0)
Protein, ur: NEGATIVE mg/dL
SPECIFIC GRAVITY, URINE: 1.002 — AB (ref 1.005–1.030)

## 2017-11-24 LAB — CBC
HEMATOCRIT: 34.6 % — AB (ref 39.0–52.0)
HEMOGLOBIN: 11.3 g/dL — AB (ref 13.0–17.0)
MCH: 33.7 pg (ref 26.0–34.0)
MCHC: 32.7 g/dL (ref 30.0–36.0)
MCV: 103.3 fL — ABNORMAL HIGH (ref 78.0–100.0)
Platelets: 217 10*3/uL (ref 150–400)
RBC: 3.35 MIL/uL — AB (ref 4.22–5.81)
RDW: 15 % (ref 11.5–15.5)
WBC: 6.6 10*3/uL (ref 4.0–10.5)

## 2017-11-24 LAB — LIPASE, BLOOD: LIPASE: 42 U/L (ref 11–51)

## 2017-11-24 NOTE — ED Triage Notes (Signed)
Pt arrived via EMS  With ABD pain ,nausea . ETOH today with nausea.

## 2017-11-24 NOTE — ED Notes (Signed)
Pt not in hallway 8

## 2017-11-25 LAB — TROPONIN I

## 2017-11-25 MED ORDER — GI COCKTAIL ~~LOC~~
30.0000 mL | Freq: Once | ORAL | Status: AC
  Administered 2017-11-25: 30 mL via ORAL
  Filled 2017-11-25: qty 30

## 2017-11-25 MED ORDER — ONDANSETRON 4 MG PO TBDP
4.0000 mg | ORAL_TABLET | Freq: Once | ORAL | Status: AC
Start: 2017-11-25 — End: 2017-11-25
  Administered 2017-11-25: 4 mg via ORAL
  Filled 2017-11-25: qty 1

## 2017-11-25 NOTE — ED Notes (Addendum)
Multiple attempts to room patient, pt states he does not want to be seen, states he just wants to sleep. Notified security that patient needs to be escorted out.

## 2017-11-25 NOTE — Discharge Instructions (Addendum)
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
## 2017-12-07 ENCOUNTER — Emergency Department (HOSPITAL_COMMUNITY)
Admission: EM | Admit: 2017-12-07 | Discharge: 2017-12-07 | Discharge status: Against Medical Advice | Payer: Medicaid Other

## 2017-12-07 NOTE — ED Notes (Signed)
RN attempted to speak and triage pt however he did not want to speak to a nurse.  Pt refused to speak to RN.  Pt would not allow any assistance or vitals to be taken.  Pt was alert and oriented.  RN first notified.  Pt placed back in waiting room.
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
## 2017-12-18 ENCOUNTER — Emergency Department (HOSPITAL_COMMUNITY): Payer: Medicaid Other

## 2017-12-18 ENCOUNTER — Encounter (HOSPITAL_COMMUNITY): Payer: Self-pay | Admitting: *Deleted

## 2017-12-18 ENCOUNTER — Emergency Department (HOSPITAL_COMMUNITY)
Admission: EM | Admit: 2017-12-18 | Discharge: 2017-12-19 | Disposition: A | Discharge status: Home / Self Care | Payer: Medicaid Other | Attending: Emergency Medicine | Admitting: Emergency Medicine

## 2017-12-18 DIAGNOSIS — Z79899 Other long term (current) drug therapy: Secondary | ICD-10-CM | POA: Insufficient documentation

## 2017-12-18 DIAGNOSIS — F10920 Alcohol use, unspecified with intoxication, uncomplicated: Secondary | ICD-10-CM

## 2017-12-18 DIAGNOSIS — F10929 Alcohol use, unspecified with intoxication, unspecified: Secondary | ICD-10-CM | POA: Insufficient documentation

## 2017-12-18 DIAGNOSIS — R109 Unspecified abdominal pain: Secondary | ICD-10-CM | POA: Diagnosis present

## 2017-12-18 DIAGNOSIS — F17210 Nicotine dependence, cigarettes, uncomplicated: Secondary | ICD-10-CM | POA: Diagnosis not present

## 2017-12-18 DIAGNOSIS — I10 Essential (primary) hypertension: Secondary | ICD-10-CM | POA: Diagnosis not present

## 2017-12-18 DIAGNOSIS — R1084 Generalized abdominal pain: Secondary | ICD-10-CM | POA: Insufficient documentation

## 2017-12-18 LAB — BASIC METABOLIC PANEL
Anion gap: 13 (ref 5–15)
BUN: 10 mg/dL (ref 6–20)
CALCIUM: 8.4 mg/dL — AB (ref 8.9–10.3)
CO2: 22 mmol/L (ref 22–32)
CREATININE: 0.71 mg/dL (ref 0.61–1.24)
Chloride: 103 mmol/L (ref 98–111)
GFR calc Af Amer: >60 mL/min (ref >60)
GLUCOSE: 92 mg/dL (ref 70–99)
POTASSIUM: 3.6 mmol/L (ref 3.5–5.1)
Sodium: 138 mmol/L (ref 135–145)

## 2017-12-18 LAB — CBC
HCT: 34.2 % — ABNORMAL LOW (ref 39.0–52.0)
Hemoglobin: 11.1 g/dL — ABNORMAL LOW (ref 13.0–17.0)
MCH: 33.6 pg (ref 26.0–34.0)
MCHC: 32.5 g/dL (ref 30.0–36.0)
MCV: 103.6 fL — ABNORMAL HIGH (ref 78.0–100.0)
Platelets: 180 10*3/uL (ref 150–400)
RBC: 3.3 MIL/uL — ABNORMAL LOW (ref 4.22–5.81)
RDW: 14.6 % (ref 11.5–15.5)
WBC: 4.9 10*3/uL (ref 4.0–10.5)

## 2017-12-18 LAB — I-STAT TROPONIN, ED: Troponin i, poc: 0.02 ng/mL (ref 0.00–0.08)

## 2017-12-18 LAB — LIPASE, BLOOD: Lipase: 43 U/L (ref 11–51)

## 2017-12-18 LAB — ETHANOL: ALCOHOL ETHYL (B): 327 mg/dL — AB (ref <10)

## 2017-12-18 MED ORDER — GI COCKTAIL ~~LOC~~
30.0000 mL | Freq: Once | ORAL | Status: AC
  Administered 2017-12-19: 30 mL via ORAL
  Filled 2017-12-18: qty 30

## 2017-12-18 NOTE — ED Provider Notes (Signed)
The patient is a 57 year old male well-known to the emergency department, well-known to use alcohol and to have liver problems, he states that he laid down tonight because he was not feeling quite right and someone found him lying on the ground and called 911.  The patient was complaining of both abdominal pain chest pain, also in the same breath is asking for something to drink and eat.  On my exam his lungs are clear, heart is regular, his abdomen is soft, there is no palpable liver or spleen edge, he is not have any guarding or peritoneal signs.  Will need initial work-up but likely stable for discharge once he sobers up.  Nothing acute that would make me think he has a surgical abdomen at this time.   EKG Interpretation  Date/Time:  Tuesday December 18 2017 20:40:37 EDT Ventricular Rate:  108 PR Interval:  154 QRS Duration: 78 QT Interval:  348 QTC Calculation: 466 R Axis:   44 Text Interpretation:  Sinus tachycardia Nonspecific T wave abnormality Abnormal ECG Since last tracing rate faster Confirmed by Noemi Chapel [phone]) on 12/18/2017 8:48:44 PM Also confirmed by Noemi Chapel [phone]), editor Hattie Perch ([phone])  on 12/19/2017 7:46:42 AM      Medical screening examination/treatment/procedure(s) were conducted as a shared visit with non-physician practitioner(s) and myself.  I personally evaluated the patient during the encounter.  Clinical Impression:   Final diagnoses:  Generalized abdominal pain  Alcoholic intoxication without complication (HCC)         Noemi Chapel, MD 12/19/17 1032

## 2017-12-18 NOTE — ED Triage Notes (Signed)
About an hour ago, patient felt like he was going to pass out so laid down. Woke up with mid-sternal chest pains radiating to his abdomen..Denied Nausea vomiting and SOB per EMS. However was hypotensive on scene, BP was 99/ 71. Had 500 bolus of saline and BP came up 105/64 and also gave 325 of aspirin
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
## 2017-12-18 NOTE — ED Provider Notes (Signed)
Assumed care at shift change.  See prior notes for full H&P.  Briefly, 57 y.o. M here with near syncope, abdominal pain.  Hx of frequent visits to ED, hx of alcoholism.  Intoxicated here today, ethanol 327.  Otherwise, reassuring.  Plan:  Sober and discharge in AM.  Results for orders placed or performed during the hospital encounter of 87/68/11  Basic metabolic panel  Result Value Ref Range   Sodium 138 135 - 145 mmol/L   Potassium 3.6 3.5 - 5.1 mmol/L   Chloride 103 98 - 111 mmol/L   CO2 22 22 - 32 mmol/L   Glucose, Bld 92 70 - 99 mg/dL   BUN 10 6 - 20 mg/dL   Creatinine, Ser 0.71 0.61 - 1.24 mg/dL   Calcium 8.4 (L) 8.9 - 10.3 mg/dL   GFR calc non Af Amer >60 >60 mL/min   GFR calc Af Amer >60 >60 mL/min   Anion gap 13 5 - 15  CBC  Result Value Ref Range   WBC 4.9 4.0 - 10.5 K/uL   RBC 3.30 (L) 4.22 - 5.81 MIL/uL   Hemoglobin 11.1 (L) 13.0 - 17.0 g/dL   HCT 34.2 (L) 39.0 - 52.0 %   MCV 103.6 (H) 78.0 - 100.0 fL   MCH 33.6 26.0 - 34.0 pg   MCHC 32.5 30.0 - 36.0 g/dL   RDW 14.6 11.5 - 15.5 %   Platelets 180 150 - 400 K/uL  Ethanol  Result Value Ref Range   Alcohol, Ethyl (B) 327 (HH) <10 mg/dL  Lipase, blood  Result Value Ref Range   Lipase 43 11 - 51 U/L  I-stat troponin, ED  Result Value Ref Range   Troponin i, poc 0.02 0.00 - 0.08 ng/mL   Comment 3           Dg Chest 2 View  Result Date: 12/18/2017 CLINICAL DATA:  Chest pain EXAM: CHEST - 2 VIEW COMPARISON:  11/17/2017, 08/03/2017 FINDINGS: The heart size and mediastinal contours are within normal limits. Both lungs are clear. Mild scoliosis of the spine. IMPRESSION: No active cardiopulmonary disease. Electronically Signed   By: Donavan Foil M.D.   On: 12/18/2017 21:30    6:03 AM Patient allowed to sober here overnight.  VSS.  D/c home in stable condition.    Larene Pickett, PA-C 12/19/17 [id number]    Randal Buba, April, MD 12/19/17 [phone]

## 2017-12-19 NOTE — ED Notes (Signed)
RN attempted to discharge pt, he became belligerent and very rude to RN.  Pt left w/o signing discharge papers.

## 2017-12-19 NOTE — ED Notes (Signed)
Patient was seen up moving in the room by RN. Was educated the importance of calling for help and also implications that arise when he falls. Was assisted back in bed and patient's backpack was kept close to the stretcher.  Will keep monitoring.

## 2017-12-22 ENCOUNTER — Encounter (HOSPITAL_COMMUNITY): Payer: Self-pay

## 2017-12-22 ENCOUNTER — Emergency Department (HOSPITAL_COMMUNITY): Payer: Medicaid Other

## 2017-12-22 ENCOUNTER — Emergency Department (HOSPITAL_COMMUNITY)
Admission: EM | Admit: 2017-12-22 | Discharge: 2017-12-23 | Disposition: A | Discharge status: Home / Self Care | Payer: Medicaid Other | Attending: Emergency Medicine | Admitting: Emergency Medicine

## 2017-12-22 DIAGNOSIS — I10 Essential (primary) hypertension: Secondary | ICD-10-CM | POA: Diagnosis not present

## 2017-12-22 DIAGNOSIS — R109 Unspecified abdominal pain: Secondary | ICD-10-CM | POA: Diagnosis present

## 2017-12-22 DIAGNOSIS — I252 Old myocardial infarction: Secondary | ICD-10-CM | POA: Diagnosis not present

## 2017-12-22 DIAGNOSIS — R1011 Right upper quadrant pain: Secondary | ICD-10-CM | POA: Diagnosis not present

## 2017-12-22 DIAGNOSIS — F17210 Nicotine dependence, cigarettes, uncomplicated: Secondary | ICD-10-CM | POA: Insufficient documentation

## 2017-12-22 DIAGNOSIS — R079 Chest pain, unspecified: Secondary | ICD-10-CM | POA: Diagnosis not present

## 2017-12-22 DIAGNOSIS — R1084 Generalized abdominal pain: Secondary | ICD-10-CM

## 2017-12-22 DIAGNOSIS — F10920 Alcohol use, unspecified with intoxication, uncomplicated: Secondary | ICD-10-CM

## 2017-12-22 DIAGNOSIS — F10220 Alcohol dependence with intoxication, uncomplicated: Secondary | ICD-10-CM | POA: Diagnosis not present

## 2017-12-22 DIAGNOSIS — Z79899 Other long term (current) drug therapy: Secondary | ICD-10-CM | POA: Diagnosis not present

## 2017-12-22 DIAGNOSIS — K625 Hemorrhage of anus and rectum: Secondary | ICD-10-CM | POA: Diagnosis not present

## 2017-12-22 DIAGNOSIS — Y908 Blood alcohol level of 240 mg/100 ml or more: Secondary | ICD-10-CM | POA: Diagnosis not present
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
## 2017-12-23 ENCOUNTER — Emergency Department (HOSPITAL_COMMUNITY): Payer: Medicaid Other

## 2017-12-23 ENCOUNTER — Encounter (HOSPITAL_COMMUNITY): Payer: Self-pay | Admitting: Radiology

## 2017-12-23 LAB — BASIC METABOLIC PANEL
ANION GAP: 14 (ref 5–15)
BUN: 5 mg/dL — ABNORMAL LOW (ref 6–20)
CALCIUM: 8.9 mg/dL (ref 8.9–10.3)
CHLORIDE: 106 mmol/L (ref 98–111)
CO2: 19 mmol/L — AB (ref 22–32)
Creatinine, Ser: 0.67 mg/dL (ref 0.61–1.24)
GFR calc Af Amer: >60 mL/min (ref >60)
GFR calc non Af Amer: >60 mL/min (ref >60)
Glucose, Bld: 110 mg/dL — ABNORMAL HIGH (ref 70–99)
Potassium: 3.1 mmol/L — ABNORMAL LOW (ref 3.5–5.1)
SODIUM: 139 mmol/L (ref 135–145)

## 2017-12-23 LAB — HEPATIC FUNCTION PANEL
ALT: 37 U/L (ref 0–44)
AST: 98 U/L — AB (ref 15–41)
Albumin: 3 g/dL — ABNORMAL LOW (ref 3.5–5.0)
Alkaline Phosphatase: 251 U/L — ABNORMAL HIGH (ref 38–126)
Bilirubin, Direct: 0.3 mg/dL — ABNORMAL HIGH (ref 0.0–0.2)
Indirect Bilirubin: 0.8 mg/dL (ref 0.3–0.9)
TOTAL PROTEIN: 7.9 g/dL (ref 6.5–8.1)
Total Bilirubin: 1.1 mg/dL (ref 0.3–1.2)

## 2017-12-23 LAB — CBC
HCT: 38.6 % — ABNORMAL LOW (ref 39.0–52.0)
HEMOGLOBIN: 12.3 g/dL — AB (ref 13.0–17.0)
MCH: 33.3 pg (ref 26.0–34.0)
MCHC: 31.9 g/dL (ref 30.0–36.0)
MCV: 104.6 fL — AB (ref 78.0–100.0)
PLATELETS: 184 10*3/uL (ref 150–400)
RBC: 3.69 MIL/uL — AB (ref 4.22–5.81)
RDW: 14.3 % (ref 11.5–15.5)
WBC: 5.5 10*3/uL (ref 4.0–10.5)

## 2017-12-23 LAB — ETHANOL: Alcohol, Ethyl (B): 368 mg/dL (ref <10)

## 2017-12-23 LAB — URINALYSIS, ROUTINE W REFLEX MICROSCOPIC
BILIRUBIN URINE: NEGATIVE
Bacteria, UA: NONE SEEN
GLUCOSE, UA: NEGATIVE mg/dL
HGB URINE DIPSTICK: NEGATIVE
KETONES UR: NEGATIVE mg/dL
Leukocytes, UA: NEGATIVE
NITRITE: NEGATIVE
Protein, ur: NEGATIVE mg/dL
Specific Gravity, Urine: 1.002 — ABNORMAL LOW (ref 1.005–1.030)
pH: 6 (ref 5.0–8.0)

## 2017-12-23 LAB — LIPASE, BLOOD: LIPASE: 39 U/L (ref 11–51)

## 2017-12-23 LAB — TROPONIN I

## 2017-12-23 LAB — POC OCCULT BLOOD, ED: FECAL OCCULT BLD: POSITIVE — AB

## 2017-12-23 LAB — I-STAT TROPONIN, ED: TROPONIN I, POC: 0.03 ng/mL (ref 0.00–0.08)

## 2017-12-23 MED ORDER — POTASSIUM CHLORIDE CRYS ER 20 MEQ PO TBCR
40.0000 meq | EXTENDED_RELEASE_TABLET | Freq: Once | ORAL | Status: AC
  Administered 2017-12-23: 40 meq via ORAL
  Filled 2017-12-23: qty 2

## 2017-12-23 MED ORDER — LORAZEPAM 1 MG PO TABS: 0.0000 mg | ORAL_TABLET | Freq: Four times a day (QID) | ORAL | Status: DC

## 2017-12-23 MED ORDER — OMEPRAZOLE 20 MG PO CPDR: 20.0000 mg | DELAYED_RELEASE_CAPSULE | Freq: Every day | ORAL | 0 refills | Status: DC

## 2017-12-23 MED ORDER — FOLIC ACID 1 MG PO TABS: 1.0000 mg | ORAL_TABLET | Freq: Every day | ORAL | Status: DC

## 2017-12-23 MED ORDER — THIAMINE HCL 100 MG/ML IJ SOLN: 100.0000 mg | Freq: Every day | INTRAMUSCULAR | Status: DC

## 2017-12-23 MED ORDER — FENTANYL CITRATE (PF) 100 MCG/2ML IJ SOLN
25.0000 ug | Freq: Once | INTRAMUSCULAR | Status: AC
  Administered 2017-12-23: 25 ug via INTRAVENOUS
  Filled 2017-12-23: qty 2

## 2017-12-23 MED ORDER — LORAZEPAM 1 MG PO TABS: 1.0000 mg | ORAL_TABLET | Freq: Four times a day (QID) | ORAL | Status: DC | PRN

## 2017-12-23 MED ORDER — LORAZEPAM 1 MG PO TABS: 0.0000 mg | ORAL_TABLET | Freq: Two times a day (BID) | ORAL | Status: DC

## 2017-12-23 MED ORDER — VITAMIN B-1 100 MG PO TABS: 100.0000 mg | ORAL_TABLET | Freq: Every day | ORAL | Status: DC

## 2017-12-23 MED ORDER — TECHNETIUM TC 99M MEBROFENIN IV KIT
5.3900 | PACK | Freq: Once | INTRAVENOUS | Status: AC | PRN
  Administered 2017-12-23: 5.39 via INTRAVENOUS

## 2017-12-23 MED ORDER — IOPAMIDOL (ISOVUE-300) INJECTION 61%
INTRAVENOUS | Status: AC
  Filled 2017-12-23: qty 100

## 2017-12-23 MED ORDER — LORAZEPAM 2 MG/ML IJ SOLN: 1.0000 mg | Freq: Four times a day (QID) | INTRAMUSCULAR | Status: DC | PRN

## 2017-12-23 MED ORDER — IOPAMIDOL (ISOVUE-300) INJECTION 61%
100.0000 mL | Freq: Once | INTRAVENOUS | Status: AC | PRN
  Administered 2017-12-23: 100 mL via INTRAVENOUS

## 2017-12-23 MED ORDER — ADULT MULTIVITAMIN W/MINERALS CH: 1.0000 | ORAL_TABLET | Freq: Every day | ORAL | Status: DC

## 2017-12-23 NOTE — ED Provider Notes (Signed)
3:57 PM Substantial delay in obtaining the patient's HIDA scan.  Patient is awake, alert, hemodynamically unremarkable. With reassuring findings, patient is appropriate for discharge with outpatient follow-up.    Carmin Muskrat, MD 12/23/17 [phone]

## 2017-12-23 NOTE — ED Notes (Signed)
Called [facility] to note that patient is 10 minutes from the 6 hour mark and he is ready for transport. Patient informed of next steps

## 2017-12-23 NOTE — Discharge Instructions (Addendum)
As discussed, your evaluation today has been largely reassuring.  But, it is important that you monitor your condition carefully, and do not hesitate to return to the ED if you develop new, or concerning changes in your condition. ? ?Otherwise, please follow-up with your physician for appropriate ongoing care. ? ?

## 2017-12-23 NOTE — ED Notes (Signed)
Patient transported to [facility] 

## 2017-12-23 NOTE — ED Notes (Signed)
D/c reviewed with patient 
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
## 2017-12-23 NOTE — ED Notes (Addendum)
[facility] called to discuss PT's readiness for testing. Patient will do the test 6 hours after pain medicine and 6 Hours of NPO

## 2017-12-28 ENCOUNTER — Encounter (HOSPITAL_COMMUNITY): Payer: Self-pay

## 2017-12-28 ENCOUNTER — Emergency Department (HOSPITAL_COMMUNITY)
Admission: EM | Admit: 2017-12-28 | Discharge: 2017-12-29 | Disposition: A | Discharge status: Home / Self Care | Payer: Medicaid Other | Attending: Emergency Medicine | Admitting: Emergency Medicine

## 2017-12-28 ENCOUNTER — Other Ambulatory Visit: Payer: Self-pay

## 2017-12-28 DIAGNOSIS — F10920 Alcohol use, unspecified with intoxication, uncomplicated: Secondary | ICD-10-CM

## 2017-12-28 DIAGNOSIS — I252 Old myocardial infarction: Secondary | ICD-10-CM | POA: Diagnosis not present

## 2017-12-28 DIAGNOSIS — I10 Essential (primary) hypertension: Secondary | ICD-10-CM | POA: Insufficient documentation

## 2017-12-28 DIAGNOSIS — Z7982 Long term (current) use of aspirin: Secondary | ICD-10-CM | POA: Diagnosis not present

## 2017-12-28 DIAGNOSIS — R1013 Epigastric pain: Secondary | ICD-10-CM | POA: Diagnosis not present

## 2017-12-28 DIAGNOSIS — Z79899 Other long term (current) drug therapy: Secondary | ICD-10-CM | POA: Insufficient documentation

## 2017-12-28 DIAGNOSIS — F17210 Nicotine dependence, cigarettes, uncomplicated: Secondary | ICD-10-CM | POA: Insufficient documentation

## 2017-12-28 LAB — COMPREHENSIVE METABOLIC PANEL
ALT: 44 U/L (ref 0–44)
ANION GAP: 12 (ref 5–15)
AST: 123 U/L — ABNORMAL HIGH (ref 15–41)
Albumin: 3.2 g/dL — ABNORMAL LOW (ref 3.5–5.0)
Alkaline Phosphatase: 290 U/L — ABNORMAL HIGH (ref 38–126)
BUN: 8 mg/dL (ref 6–20)
CHLORIDE: 101 mmol/L (ref 98–111)
CO2: 20 mmol/L — AB (ref 22–32)
CREATININE: 0.86 mg/dL (ref 0.61–1.24)
Calcium: 8.7 mg/dL — ABNORMAL LOW (ref 8.9–10.3)
Glucose, Bld: 90 mg/dL (ref 70–99)
POTASSIUM: 3.8 mmol/L (ref 3.5–5.1)
SODIUM: 133 mmol/L — AB (ref 135–145)
Total Bilirubin: 1.3 mg/dL — ABNORMAL HIGH (ref 0.3–1.2)
Total Protein: 7.8 g/dL (ref 6.5–8.1)

## 2017-12-28 LAB — CBC
HEMATOCRIT: 35.9 % — AB (ref 39.0–52.0)
HEMOGLOBIN: 11.7 g/dL — AB (ref 13.0–17.0)
MCH: 34.3 pg — AB (ref 26.0–34.0)
MCHC: 32.6 g/dL (ref 30.0–36.0)
MCV: 105.3 fL — AB (ref 78.0–100.0)
PLATELETS: 178 10*3/uL (ref 150–400)
RBC: 3.41 MIL/uL — AB (ref 4.22–5.81)
RDW: 14.1 % (ref 11.5–15.5)
WBC: 5.2 10*3/uL (ref 4.0–10.5)

## 2017-12-28 LAB — LIPASE, BLOOD: LIPASE: 49 U/L (ref 11–51)

## 2017-12-28 LAB — ETHANOL: Alcohol, Ethyl (B): 353 mg/dL (ref <10)

## 2017-12-28 MED ORDER — KETOROLAC TROMETHAMINE 60 MG/2ML IM SOLN
60.0000 mg | Freq: Once | INTRAMUSCULAR | Status: AC
  Administered 2017-12-29: 60 mg via INTRAMUSCULAR
  Filled 2017-12-28: qty 2
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
## 2017-12-28 NOTE — ED Provider Notes (Signed)
Patient placed in Quick Look pathway, seen and evaluated   Chief Complaint: abdominal pain  HPI:   Devin [name] is a 57 y.o. male who drinks liquor and beer on a regular basis and is homeless arrived to the ED via EMS for abdominal pain that he rates as 9/10. He reports swelling and diarrhea x 2 weeks. Patient reports he has had some vomiting, given 4 mg Zofran PTA. Pt has 18 L AC placed by EMS.   ROS: GI: nausea and abdominal pain    Physical Exam:  BP 112/77 (BP Location: Right Arm)   Pulse (!) 114   Temp 98.4 F (36.9 C) (Oral)   Resp 18   Ht 5\' 10"  (1.778 m)   Wt 66 kg   SpO2 97%   BMI 20.88 kg/m    Gen: No distress  Neuro: Awake and Alert  Skin: Warm and dry  Abdomen: tender with palpation RUQ, RLQ    Initiation of care has begun. The patient has been counseled on the process, plan, and necessity for staying for the completion/evaluation, and the remainder of the medical screening examination    Ashley Murrain, NP 12/28/17 Olga, Kevin, MD 12/29/17 [phone]

## 2017-12-28 NOTE — ED Triage Notes (Signed)
Pt arrived via [facility] EMS with c/o abd pain and swelling X2 weeks. Pt reports he has had some vomiting, given 4 mg Zofran PTA. Tenderness upon palpation. Pt has 18 L AC placed by EMS.

## 2017-12-29 NOTE — Discharge Instructions (Addendum)
Take Prilosec, 20 mg twice daily as prescribed at your last visit.  Reduce your alcohol consumption significantly.  Follow-up with gastroenterology if your symptoms are not improving in the next week.  The contact information for [facility] has been provided in this discharge summary for you to call and make these arrangements.

## 2018-01-10 ENCOUNTER — Encounter (HOSPITAL_COMMUNITY): Payer: Self-pay | Admitting: Emergency Medicine

## 2018-01-10 ENCOUNTER — Emergency Department (HOSPITAL_COMMUNITY)
Admission: EM | Admit: 2018-01-10 | Discharge: 2018-01-11 | Disposition: A | Discharge status: Home / Self Care | Payer: Medicaid Other | Attending: Emergency Medicine | Admitting: Emergency Medicine

## 2018-01-10 DIAGNOSIS — F17210 Nicotine dependence, cigarettes, uncomplicated: Secondary | ICD-10-CM | POA: Diagnosis not present

## 2018-01-10 DIAGNOSIS — I10 Essential (primary) hypertension: Secondary | ICD-10-CM | POA: Insufficient documentation

## 2018-01-10 DIAGNOSIS — Z7982 Long term (current) use of aspirin: Secondary | ICD-10-CM | POA: Insufficient documentation

## 2018-01-10 DIAGNOSIS — R000 Tachycardia, unspecified: Secondary | ICD-10-CM | POA: Diagnosis not present

## 2018-01-10 DIAGNOSIS — R1013 Epigastric pain: Secondary | ICD-10-CM

## 2018-01-10 DIAGNOSIS — Z590 Homelessness: Secondary | ICD-10-CM | POA: Insufficient documentation

## 2018-01-10 DIAGNOSIS — Z9119 Patient's noncompliance with other medical treatment and regimen: Secondary | ICD-10-CM | POA: Diagnosis not present

## 2018-01-10 DIAGNOSIS — Z79899 Other long term (current) drug therapy: Secondary | ICD-10-CM | POA: Diagnosis not present

## 2018-01-10 DIAGNOSIS — R1011 Right upper quadrant pain: Secondary | ICD-10-CM | POA: Insufficient documentation

## 2018-01-10 LAB — URINALYSIS, ROUTINE W REFLEX MICROSCOPIC
BILIRUBIN URINE: NEGATIVE
Glucose, UA: NEGATIVE mg/dL
HGB URINE DIPSTICK: NEGATIVE
Ketones, ur: NEGATIVE mg/dL
Leukocytes, UA: NEGATIVE
Nitrite: NEGATIVE
Protein, ur: NEGATIVE mg/dL
SPECIFIC GRAVITY, URINE: 1.002 — AB (ref 1.005–1.030)
pH: 6 (ref 5.0–8.0)

## 2018-01-10 LAB — COMPREHENSIVE METABOLIC PANEL
ALBUMIN: 3.6 g/dL (ref 3.5–5.0)
ALK PHOS: 323 U/L — AB (ref 38–126)
ALT: 47 U/L — ABNORMAL HIGH (ref 0–44)
ANION GAP: 12 (ref 5–15)
AST: 131 U/L — ABNORMAL HIGH (ref 15–41)
BUN: 10 mg/dL (ref 6–20)
CHLORIDE: 103 mmol/L (ref 98–111)
CO2: 21 mmol/L — AB (ref 22–32)
Calcium: 9.4 mg/dL (ref 8.9–10.3)
Creatinine, Ser: 0.76 mg/dL (ref 0.61–1.24)
GFR calc Af Amer: >60 mL/min (ref >60)
GFR calc non Af Amer: >60 mL/min (ref >60)
GLUCOSE: 101 mg/dL — AB (ref 70–99)
POTASSIUM: 3.7 mmol/L (ref 3.5–5.1)
SODIUM: 136 mmol/L (ref 135–145)
Total Bilirubin: 1.2 mg/dL (ref 0.3–1.2)
Total Protein: 8.4 g/dL — ABNORMAL HIGH (ref 6.5–8.1)

## 2018-01-10 LAB — CBC
HCT: 38.6 % — ABNORMAL LOW (ref 39.0–52.0)
HEMOGLOBIN: 12.4 g/dL — AB (ref 13.0–17.0)
MCH: 34.1 pg — AB (ref 26.0–34.0)
MCHC: 32.1 g/dL (ref 30.0–36.0)
MCV: 106 fL — AB (ref 78.0–100.0)
Platelets: 216 10*3/uL (ref 150–400)
RBC: 3.64 MIL/uL — ABNORMAL LOW (ref 4.22–5.81)
RDW: 13.6 % (ref 11.5–15.5)
WBC: 6.1 10*3/uL (ref 4.0–10.5)

## 2018-01-10 LAB — LIPASE, BLOOD: Lipase: 46 U/L (ref 11–51)

## 2018-01-10 MED ORDER — ONDANSETRON 4 MG PO TBDP
4.0000 mg | ORAL_TABLET | Freq: Once | ORAL | Status: AC
  Administered 2018-01-10: 4 mg via ORAL
  Filled 2018-01-10: qty 1

## 2018-01-10 MED ORDER — FAMOTIDINE 20 MG PO TABS: 20.0000 mg | ORAL_TABLET | Freq: Two times a day (BID) | ORAL | 0 refills | Status: DC

## 2018-01-10 MED ORDER — RANITIDINE HCL 150 MG/10ML PO SYRP
150.0000 mg | ORAL_SOLUTION | Freq: Once | ORAL | Status: AC
  Administered 2018-01-10: 150 mg via ORAL
  Filled 2018-01-10: qty 10

## 2018-01-10 MED ORDER — GI COCKTAIL ~~LOC~~
30.0000 mL | Freq: Once | ORAL | Status: AC
  Administered 2018-01-10: 30 mL via ORAL
  Filled 2018-01-10: qty 30

## 2018-01-10 NOTE — ED Triage Notes (Signed)
Pt presents with abd pain with n/v ongoing but worse today; blood in stool, hx hemorrhoids

## 2018-01-10 NOTE — Discharge Instructions (Signed)
Take your home medicines as prescribed.  You can take Pepcid twice daily.  You have this at home.  Avoid drinking alcohol.  Avoid fried foods, fatty foods, or spicy foods.  Follow-up with your primary care physician or gastroenterologist for reevaluation of your symptoms.  Return to the emergency department if any concerning signs or symptoms develop such as fevers, persistent vomiting, or lightheadedness.

## 2018-01-10 NOTE — ED Provider Notes (Addendum)
MSE was initiated and I personally evaluated the patient  5:48 PM on January 10, 2018.  Patient placed in Quick Look pathway, seen and evaluated   Chief Complaint: abdominal pain  HPI:   Patient is a 57 year old male with hx of alcoholic liver disease, GERD, homelessness, HTN, hyperlipidemia presents to the ED with complaints of abdominal pain that has been going on "awhile" worse today with N/V/D today. Has noted some blood in stool, hx of hemorrhoids. Denies recent abx or foreign travel.   ROS: + abdominal pain, n/v/d, - urinary sxs  Physical Exam:   Gen: No distress  Neuro: Awake and Alert  Skin: Warm    Focused Exam: Abdomen: normoactive bowel sounds, diffuse tenderness to palpation.   Initiation of care has begun. The patient has been counseled on the process, plan, and necessity for staying for the completion/evaluation, and the remainder of the medical screening examination. I discussed with patient  and if present family/friends the importance of alerting staff to any new or worsening symptoms or any other concerns throughout his/her ER visit.      Amaryllis Dyke, PA-C 01/10/18 1751    Amaryllis Dyke, PA-C 01/10/18 1756    Davonna Belling, MD 01/11/18 0104
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
## 2018-01-10 NOTE — ED Notes (Signed)
Pt given sandwich meal. Denies any additional n/v. Reports continued pain.

## 2018-01-11 NOTE — ED Notes (Signed)
Reviewed d/c instructions with pt, who verbalized understanding and had no outstanding questions. Pt departed in NAD, refused use of wheelchair. Pt given sandwich meal, Sprite, and bus pass upon request.

## 2018-01-12 ENCOUNTER — Emergency Department (HOSPITAL_COMMUNITY)
Admission: EM | Admit: 2018-01-12 | Discharge: 2018-01-12 | Disposition: A | Discharge status: Home / Self Care | Payer: Medicaid Other | Attending: Emergency Medicine | Admitting: Emergency Medicine

## 2018-01-12 ENCOUNTER — Other Ambulatory Visit: Payer: Self-pay

## 2018-01-12 DIAGNOSIS — Z79899 Other long term (current) drug therapy: Secondary | ICD-10-CM | POA: Diagnosis not present

## 2018-01-12 DIAGNOSIS — F17210 Nicotine dependence, cigarettes, uncomplicated: Secondary | ICD-10-CM | POA: Diagnosis not present

## 2018-01-12 DIAGNOSIS — I252 Old myocardial infarction: Secondary | ICD-10-CM | POA: Insufficient documentation

## 2018-01-12 DIAGNOSIS — I10 Essential (primary) hypertension: Secondary | ICD-10-CM | POA: Diagnosis not present

## 2018-01-12 DIAGNOSIS — F1010 Alcohol abuse, uncomplicated: Secondary | ICD-10-CM | POA: Insufficient documentation

## 2018-01-12 DIAGNOSIS — Z7982 Long term (current) use of aspirin: Secondary | ICD-10-CM | POA: Diagnosis not present

## 2018-01-12 DIAGNOSIS — R1013 Epigastric pain: Secondary | ICD-10-CM | POA: Insufficient documentation

## 2018-01-12 MED ORDER — GI COCKTAIL ~~LOC~~
30.0000 mL | Freq: Once | ORAL | Status: AC
  Administered 2018-01-12: 30 mL via ORAL
  Filled 2018-01-12: qty 30

## 2018-01-12 MED ORDER — FAMOTIDINE 20 MG PO TABS
20.0000 mg | ORAL_TABLET | Freq: Once | ORAL | Status: AC
  Administered 2018-01-12: 20 mg via ORAL
  Filled 2018-01-12: qty 1

## 2018-01-12 NOTE — ED Notes (Signed)
Bed: [facility] Expected date:  Expected time:  Means of arrival:  Comments: ETOH 
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
## 2018-01-12 NOTE — ED Notes (Signed)
Provided pt with two sandwiches

## 2018-01-12 NOTE — Discharge Instructions (Addendum)
It was our pleasure to provide your ER care today - we hope that you feel better.  Continue taking acid blocker medication as prescribed.   Avoid any alcohol use - follow up with AA, and use resource guide provided for additional community resources.   Follow up with primary care doctor in the coming week.   Return to ER if worse, new symptoms, high fevers, persistent vomiting, other concern.

## 2018-01-12 NOTE — ED Notes (Signed)
Pt informed he is to be discharged, he is arguing, requesting to see the Dr. Aletha Halim notify Dr Ashok Cordia.

## 2018-01-12 NOTE — ED Triage Notes (Signed)
Pt arrives via EMS from [place]. Pt is homeless. Pt has been drinking all day today. Pt reports generalized abd pain. EMS reports hernia. Pt reports N/V

## 2018-01-12 NOTE — ED Notes (Signed)
Devin [name], charge Dispensing optician at the bedside. Pt is being escorted. He declined discharge vital signs. Bus pass provided.

## 2018-01-15 ENCOUNTER — Emergency Department (HOSPITAL_COMMUNITY): Payer: Medicaid Other

## 2018-01-15 ENCOUNTER — Encounter (HOSPITAL_COMMUNITY): Payer: Self-pay

## 2018-01-15 ENCOUNTER — Emergency Department (HOSPITAL_COMMUNITY)
Admission: EM | Admit: 2018-01-15 | Discharge: 2018-01-15 | Disposition: A | Discharge status: Home / Self Care | Payer: Medicaid Other | Attending: Emergency Medicine | Admitting: Emergency Medicine

## 2018-01-15 ENCOUNTER — Other Ambulatory Visit: Payer: Self-pay

## 2018-01-15 DIAGNOSIS — F10929 Alcohol use, unspecified with intoxication, unspecified: Secondary | ICD-10-CM | POA: Diagnosis present

## 2018-01-15 DIAGNOSIS — R1013 Epigastric pain: Secondary | ICD-10-CM

## 2018-01-15 DIAGNOSIS — Z7982 Long term (current) use of aspirin: Secondary | ICD-10-CM | POA: Diagnosis not present

## 2018-01-15 DIAGNOSIS — F1010 Alcohol abuse, uncomplicated: Secondary | ICD-10-CM | POA: Insufficient documentation

## 2018-01-15 DIAGNOSIS — Z79899 Other long term (current) drug therapy: Secondary | ICD-10-CM | POA: Diagnosis not present

## 2018-01-15 DIAGNOSIS — F17210 Nicotine dependence, cigarettes, uncomplicated: Secondary | ICD-10-CM | POA: Insufficient documentation

## 2018-01-15 DIAGNOSIS — I10 Essential (primary) hypertension: Secondary | ICD-10-CM | POA: Insufficient documentation

## 2018-01-15 LAB — URINALYSIS, ROUTINE W REFLEX MICROSCOPIC
BILIRUBIN URINE: NEGATIVE
Glucose, UA: NEGATIVE mg/dL
HGB URINE DIPSTICK: NEGATIVE
Ketones, ur: NEGATIVE mg/dL
Leukocytes, UA: NEGATIVE
Nitrite: NEGATIVE
Protein, ur: NEGATIVE mg/dL
SPECIFIC GRAVITY, URINE: 1.013 (ref 1.005–1.030)
pH: 6 (ref 5.0–8.0)

## 2018-01-15 LAB — COMPREHENSIVE METABOLIC PANEL
ALK PHOS: 361 U/L — AB (ref 38–126)
ALT: 60 U/L — AB (ref 0–44)
ANION GAP: 13 (ref 5–15)
AST: 200 U/L — ABNORMAL HIGH (ref 15–41)
Albumin: 3.5 g/dL (ref 3.5–5.0)
BILIRUBIN TOTAL: 1.5 mg/dL — AB (ref 0.3–1.2)
BUN: 19 mg/dL (ref 6–20)
CALCIUM: 9.1 mg/dL (ref 8.9–10.3)
CO2: 19 mmol/L — AB (ref 22–32)
CREATININE: 0.83 mg/dL (ref 0.61–1.24)
Chloride: 104 mmol/L (ref 98–111)
GFR calc non Af Amer: >60 mL/min (ref >60)
Glucose, Bld: 103 mg/dL — ABNORMAL HIGH (ref 70–99)
Potassium: 4.6 mmol/L (ref 3.5–5.1)
Sodium: 136 mmol/L (ref 135–145)
Total Protein: 8.3 g/dL — ABNORMAL HIGH (ref 6.5–8.1)

## 2018-01-15 LAB — CBC
HCT: 37.2 % — ABNORMAL LOW (ref 39.0–52.0)
HEMOGLOBIN: 12.5 g/dL — AB (ref 13.0–17.0)
MCH: 34.4 pg — AB (ref 26.0–34.0)
MCHC: 33.6 g/dL (ref 30.0–36.0)
MCV: 102.5 fL — ABNORMAL HIGH (ref 78.0–100.0)
Platelets: 239 10*3/uL (ref 150–400)
RBC: 3.63 MIL/uL — AB (ref 4.22–5.81)
RDW: 13.5 % (ref 11.5–15.5)
WBC: 6.1 10*3/uL (ref 4.0–10.5)

## 2018-01-15 LAB — LIPASE, BLOOD: Lipase: 44 U/L (ref 11–51)

## 2018-01-15 LAB — ETHANOL: ALCOHOL ETHYL (B): 320 mg/dL — AB (ref <10)

## 2018-01-15 MED ORDER — FAMOTIDINE 20 MG PO TABS
20.0000 mg | ORAL_TABLET | Freq: Once | ORAL | Status: AC
  Administered 2018-01-15: 20 mg via ORAL
  Filled 2018-01-15: qty 1

## 2018-01-15 MED ORDER — GI COCKTAIL ~~LOC~~
30.0000 mL | Freq: Once | ORAL | Status: AC
  Administered 2018-01-15: 30 mL via ORAL
  Filled 2018-01-15: qty 30

## 2018-01-15 NOTE — ED Notes (Signed)
When taking pt to lobby pt asked for a bus pass and something to eat. I explained to pt that I couldn't feed him until the DR seen him due to being here for abdominal pain and he couldn't be given a bus pass until he was seen and discharged.
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
## 2018-01-15 NOTE — ED Triage Notes (Signed)
He called EMS d/t "abdominal pain". He is intoxicated (ETOH) in appearance and demeanor. He is in no distress. Labs performed at triage.

## 2018-01-15 NOTE — ED Notes (Signed)
Ethanol 327

## 2018-01-15 NOTE — Discharge Instructions (Addendum)
Take the pepcid prescribed a few days ago.  Try to stop drinking.

## 2018-01-15 NOTE — ED Notes (Signed)
Patient transported to X-ray 

## 2018-01-15 NOTE — ED Notes (Signed)
Patient ambulatory without difficulty. Provided with bus pass, sandwich, sprite and milk at time of discharge.

## 2018-01-17 ENCOUNTER — Encounter (HOSPITAL_COMMUNITY): Payer: Self-pay | Admitting: Obstetrics and Gynecology

## 2018-01-17 ENCOUNTER — Emergency Department (HOSPITAL_COMMUNITY)
Admission: EM | Admit: 2018-01-17 | Discharge: 2018-01-18 | Disposition: A | Discharge status: Home / Self Care | Payer: Medicaid Other | Attending: Emergency Medicine | Admitting: Emergency Medicine

## 2018-01-17 ENCOUNTER — Emergency Department (HOSPITAL_COMMUNITY): Payer: Medicaid Other

## 2018-01-17 ENCOUNTER — Other Ambulatory Visit: Payer: Self-pay

## 2018-01-17 DIAGNOSIS — I252 Old myocardial infarction: Secondary | ICD-10-CM | POA: Insufficient documentation

## 2018-01-17 DIAGNOSIS — Y906 Blood alcohol level of 120-199 mg/100 ml: Secondary | ICD-10-CM | POA: Insufficient documentation

## 2018-01-17 DIAGNOSIS — K210 Gastro-esophageal reflux disease with esophagitis: Secondary | ICD-10-CM | POA: Diagnosis not present

## 2018-01-17 DIAGNOSIS — F10920 Alcohol use, unspecified with intoxication, uncomplicated: Secondary | ICD-10-CM | POA: Diagnosis not present

## 2018-01-17 DIAGNOSIS — R109 Unspecified abdominal pain: Secondary | ICD-10-CM

## 2018-01-17 DIAGNOSIS — R112 Nausea with vomiting, unspecified: Secondary | ICD-10-CM | POA: Insufficient documentation

## 2018-01-17 DIAGNOSIS — I10 Essential (primary) hypertension: Secondary | ICD-10-CM | POA: Insufficient documentation

## 2018-01-17 DIAGNOSIS — R1011 Right upper quadrant pain: Secondary | ICD-10-CM | POA: Diagnosis not present

## 2018-01-17 DIAGNOSIS — F17210 Nicotine dependence, cigarettes, uncomplicated: Secondary | ICD-10-CM | POA: Diagnosis not present

## 2018-01-17 DIAGNOSIS — R748 Abnormal levels of other serum enzymes: Secondary | ICD-10-CM | POA: Diagnosis not present

## 2018-01-17 DIAGNOSIS — Z79899 Other long term (current) drug therapy: Secondary | ICD-10-CM | POA: Diagnosis not present

## 2018-01-17 DIAGNOSIS — R1013 Epigastric pain: Secondary | ICD-10-CM | POA: Diagnosis not present

## 2018-01-17 LAB — COMPREHENSIVE METABOLIC PANEL
ALBUMIN: 3.4 g/dL — AB (ref 3.5–5.0)
ALK PHOS: 340 U/L — AB (ref 38–126)
ALT: 60 U/L — AB (ref 0–44)
AST: 178 U/L — ABNORMAL HIGH (ref 15–41)
Anion gap: 14 (ref 5–15)
BUN: 14 mg/dL (ref 6–20)
CO2: 23 mmol/L (ref 22–32)
Calcium: 9.6 mg/dL (ref 8.9–10.3)
Chloride: 101 mmol/L (ref 98–111)
Creatinine, Ser: 1.16 mg/dL (ref 0.61–1.24)
GFR calc Af Amer: >60 mL/min (ref >60)
GFR calc non Af Amer: >60 mL/min (ref >60)
GLUCOSE: 131 mg/dL — AB (ref 70–99)
Potassium: 3.7 mmol/L (ref 3.5–5.1)
Sodium: 138 mmol/L (ref 135–145)
Total Bilirubin: 0.9 mg/dL (ref 0.3–1.2)
Total Protein: 8.2 g/dL — ABNORMAL HIGH (ref 6.5–8.1)

## 2018-01-17 LAB — CBC
HCT: 37.5 % — ABNORMAL LOW (ref 39.0–52.0)
Hemoglobin: 12.7 g/dL — ABNORMAL LOW (ref 13.0–17.0)
MCH: 34.3 pg — AB (ref 26.0–34.0)
MCHC: 33.9 g/dL (ref 30.0–36.0)
MCV: 101.4 fL — ABNORMAL HIGH (ref 78.0–100.0)
Platelets: 217 10*3/uL (ref 150–400)
RBC: 3.7 MIL/uL — ABNORMAL LOW (ref 4.22–5.81)
RDW: 13.3 % (ref 11.5–15.5)
WBC: 5.2 10*3/uL (ref 4.0–10.5)

## 2018-01-17 LAB — LIPASE, BLOOD: Lipase: 42 U/L (ref 11–51)

## 2018-01-17 LAB — ETHANOL: ALCOHOL ETHYL (B): 133 mg/dL — AB (ref <10)

## 2018-01-17 MED ORDER — LORAZEPAM 1 MG PO TABS: 0.0000 mg | ORAL_TABLET | Freq: Two times a day (BID) | ORAL | Status: DC

## 2018-01-17 MED ORDER — GI COCKTAIL ~~LOC~~
30.0000 mL | Freq: Once | ORAL | Status: AC
  Administered 2018-01-17: 30 mL via ORAL
  Filled 2018-01-17: qty 30

## 2018-01-17 MED ORDER — FAMOTIDINE IN NACL 20-0.9 MG/50ML-% IV SOLN
20.0000 mg | Freq: Once | INTRAVENOUS | Status: AC
  Administered 2018-01-18: 20 mg via INTRAVENOUS
  Filled 2018-01-17: qty 50

## 2018-01-17 MED ORDER — THIAMINE HCL 100 MG/ML IJ SOLN: 100.0000 mg | Freq: Every day | INTRAMUSCULAR | Status: DC

## 2018-01-17 MED ORDER — LORAZEPAM 1 MG PO TABS: 0.0000 mg | ORAL_TABLET | Freq: Four times a day (QID) | ORAL | Status: DC

## 2018-01-17 MED ORDER — LORAZEPAM 2 MG/ML IJ SOLN: 0.0000 mg | Freq: Two times a day (BID) | INTRAMUSCULAR | Status: DC

## 2018-01-17 MED ORDER — LORAZEPAM 2 MG/ML IJ SOLN: 0.0000 mg | Freq: Four times a day (QID) | INTRAMUSCULAR | Status: DC

## 2018-01-17 MED ORDER — VITAMIN B-1 100 MG PO TABS: 100.0000 mg | ORAL_TABLET | Freq: Every day | ORAL | Status: DC

## 2018-01-17 MED ORDER — SODIUM CHLORIDE 0.9 % IV BOLUS
1000.0000 mL | Freq: Once | INTRAVENOUS | Status: AC
  Administered 2018-01-17: 1000 mL via INTRAVENOUS

## 2018-01-17 NOTE — ED Triage Notes (Signed)
Per EMS:  Pt was at [employer] and complained of abdominal pain and they called 911. Pt reports he had an episode of emesis. Pt had no emesis with EMS.

## 2018-01-18 ENCOUNTER — Emergency Department (HOSPITAL_COMMUNITY): Payer: Medicaid Other

## 2018-01-18 MED ORDER — LORAZEPAM 1 MG PO TABS
1.0000 mg | ORAL_TABLET | Freq: Once | ORAL | Status: AC
  Administered 2018-01-18: 1 mg via ORAL
  Filled 2018-01-18: qty 1

## 2018-01-18 MED ORDER — IOPAMIDOL (ISOVUE-300) INJECTION 61%
INTRAVENOUS | Status: AC
  Filled 2018-01-18: qty 100

## 2018-01-18 MED ORDER — IOPAMIDOL (ISOVUE-300) INJECTION 61%
100.0000 mL | Freq: Once | INTRAVENOUS | Status: AC | PRN
  Administered 2018-01-18: 100 mL via INTRAVENOUS

## 2018-01-18 NOTE — ED Provider Notes (Signed)
Care assumed from PA list summary at shift change, please see her note for full details, but in brief Devin [name] is a 57 y.o. male with a history of alcohol abuse, alcohol induced liver disease, esophagitis, GERD, MI, hyperlipidemia and hypertension who presents for evaluation of epigastric and right-sided abdominal pain.  Did drink alcohol today prior to arrival.  Labs overall appear stable and at baseline, no leukocytosis, LFTs are not increased from usual, normal renal function, no acute electrode derangements and normal lipase.  Patient was tachycardic at 112 on arrival, although current review reveals the patient is consistently tachycardic anytime he is here.  2 L fluid bolus and Pepcid given for symptom management, as well as GI cocktail.  Patient's alcohol level was slightly lower than usual dose of 1 mg of Ativan was given although patient does not appear to be in active withdrawal, CIWA not significantly elevated.  At signout CT abdomen pelvis pending.  Labs Reviewed  COMPREHENSIVE METABOLIC PANEL - Abnormal; Notable for the following components:      Result Value   Glucose, Bld 131 (*)    Total Protein 8.2 (*)    Albumin 3.4 (*)    AST 178 (*)    ALT 60 (*)    Alkaline Phosphatase 340 (*)    All other components within normal limits  CBC - Abnormal; Notable for the following components:   RBC 3.70 (*)    Hemoglobin 12.7 (*)    HCT 37.5 (*)    MCV 101.4 (*)    MCH 34.3 (*)    All other components within normal limits  ETHANOL - Abnormal; Notable for the following components:   Alcohol, Ethyl (B) 133 (*)    All other components within normal limits  LIPASE, BLOOD  URINALYSIS, ROUTINE W REFLEX MICROSCOPIC   Ct Abdomen Pelvis W Contrast  Result Date: 01/18/2018 CLINICAL DATA:  Acute abdominal pain and emesis. EXAM: CT ABDOMEN AND PELVIS WITH CONTRAST TECHNIQUE: Multidetector CT imaging of the abdomen and pelvis was performed using the standard protocol following bolus  administration of intravenous contrast. CONTRAST:  172mL ISOVUE-300 IOPAMIDOL (ISOVUE-300) INJECTION 61% COMPARISON:  12/23/2017 FINDINGS: LOWER CHEST: Lung bases are clear. Included heart size is normal. No pericardial effusion. HEPATOBILIARY: Liver and gallbladder are normal. PANCREAS: Normal. SPLEEN: Normal. ADRENALS/URINARY TRACT: Kidneys are orthotopic, demonstrating symmetric enhancement. No nephrolithiasis, hydronephrosis or solid renal masses. Urinary bladder is partially distended and unremarkable. Normal adrenal glands. STOMACH/BOWEL: Mild fluid-filled distention of jejunum and proximal ileum question enteritis. No mechanical bowel obstruction. No significant stool burden. Normal appendix is visualized. Lobular slightly enhancing soft tissue prominence within the distal rectum, question hemorrhoidal tissue versus polyp versus other mucosal lesion. VASCULAR/LYMPHATIC: Aortoiliac vessels are normal in course and caliber. No lymphadenopathy by CT size criteria. REPRODUCTIVE: Normal. OTHER: No intraperitoneal free fluid or free air. MUSCULOSKELETAL: Degenerative disc disease L5-S1. Minimal anterolisthesis grade 1 of T11 and T12. IMPRESSION: 1. Mild fluid-filled distention of small bowel question enteritis. No mechanical bowel obstruction. Normal appendix. 2. Lobular soft tissue densities in the distal rectum. Cannot exclude a mucosal abnormality. Suggest clinical correlation. 3. Mild degenerative change of the included thoracolumbar spine. Electronically Signed   By: Ashley Royalty M.D.   On: 01/18/2018 00:59   Dg Abdomen Acute W/chest  Result Date: 01/17/2018 CLINICAL DATA:  Epigastric pain EXAM: DG ABDOMEN ACUTE W/ 1V CHEST COMPARISON:  01/15/2018, 12/22/2017 FINDINGS: Single view chest demonstrates no acute opacity or pleural effusion. The cardiomediastinal silhouette is normal. No pneumothorax.  Old left fifth rib fracture. Supine and upright views of the abdomen demonstrate no free air beneath the  diaphragm. Nonobstructed bowel-gas pattern with mild diffuse increased bowel gas. Calcified phleboliths in the left pelvis. IMPRESSION: Negative abdominal radiographs.  No acute cardiopulmonary disease. Electronically Signed   By: Donavan Foil M.D.   On: 01/17/2018 23:22    On my examination patient is comfortable and reports improvement in pain, heart rate has improved to 107, which seems to be patient's baseline.  CT shows some mild fluid-filled distention of the small bowel suggestive of enteritis, patient is afebrile, and I suspect this is most likely viral and related to patient's chronic gastritis.  Is no evidence of bowel obstruction, appendix is normal, there is some lobular soft tissue densities noted in the distal rectum, patient has chronic history of hemorrhoids and I suspect this is what this is most likely.  Patient has been prescribed multiple different medications to help control his symptoms and his most recent several visits, I have encouraged him to use these medications, and stressed the importance of outpatient follow-up with PCP and GI.  Return precautions have been discussed with the patient he expresses understanding and is in agreement with plan, stable for discharge home at this time.   Jacqlyn Larsen, PA-C 01/18/18 [place] Chen    Veryl Speak, MD 01/18/18 [phone]

## 2018-01-18 NOTE — Discharge Instructions (Addendum)
Your evaluation today is overall reassuring and your labs appear unchanged from usual.  Your CT scan does not show anything that would require surgery or antibiotics today.  Some mild inflammation in your small bowel is most likely related to your chronic gastritis and alcohol use.  Please use the medications that you have been prescribed multiple times, including Zofran, Carafate and Pepcid.  You will need to follow-up outpatient with primary care as well as GI for further evaluation.  Return to the emergency department for fevers, worsening abdominal pain, persistent vomiting, blood in your vomit or stools or any other new or concerning symptoms.
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
## 2018-01-21 ENCOUNTER — Encounter (HOSPITAL_COMMUNITY): Payer: Self-pay

## 2018-01-21 ENCOUNTER — Emergency Department (HOSPITAL_COMMUNITY)
Admission: EM | Admit: 2018-01-21 | Discharge: 2018-01-22 | Disposition: A | Discharge status: Home / Self Care | Payer: Medicaid Other | Attending: Emergency Medicine | Admitting: Emergency Medicine

## 2018-01-21 ENCOUNTER — Other Ambulatory Visit: Payer: Self-pay

## 2018-01-21 DIAGNOSIS — R1084 Generalized abdominal pain: Secondary | ICD-10-CM | POA: Insufficient documentation

## 2018-01-21 DIAGNOSIS — R197 Diarrhea, unspecified: Secondary | ICD-10-CM | POA: Diagnosis not present

## 2018-01-21 DIAGNOSIS — Z79899 Other long term (current) drug therapy: Secondary | ICD-10-CM | POA: Diagnosis not present

## 2018-01-21 DIAGNOSIS — K5900 Constipation, unspecified: Secondary | ICD-10-CM | POA: Diagnosis not present

## 2018-01-21 DIAGNOSIS — F17210 Nicotine dependence, cigarettes, uncomplicated: Secondary | ICD-10-CM | POA: Insufficient documentation

## 2018-01-21 DIAGNOSIS — I10 Essential (primary) hypertension: Secondary | ICD-10-CM | POA: Diagnosis not present

## 2018-01-21 DIAGNOSIS — R112 Nausea with vomiting, unspecified: Secondary | ICD-10-CM | POA: Diagnosis not present

## 2018-01-21 DIAGNOSIS — R109 Unspecified abdominal pain: Secondary | ICD-10-CM

## 2018-01-21 DIAGNOSIS — Z7982 Long term (current) use of aspirin: Secondary | ICD-10-CM | POA: Diagnosis not present

## 2018-01-21 DIAGNOSIS — I252 Old myocardial infarction: Secondary | ICD-10-CM | POA: Insufficient documentation

## 2018-01-21 DIAGNOSIS — Z590 Homelessness: Secondary | ICD-10-CM | POA: Insufficient documentation

## 2018-01-21 DIAGNOSIS — G8929 Other chronic pain: Secondary | ICD-10-CM

## 2018-01-21 LAB — URINALYSIS, ROUTINE W REFLEX MICROSCOPIC
BILIRUBIN URINE: NEGATIVE
GLUCOSE, UA: NEGATIVE mg/dL
HGB URINE DIPSTICK: NEGATIVE
Ketones, ur: NEGATIVE mg/dL
Leukocytes, UA: NEGATIVE
Nitrite: NEGATIVE
PROTEIN: NEGATIVE mg/dL
Specific Gravity, Urine: 1.002 — ABNORMAL LOW (ref 1.005–1.030)
pH: 6 (ref 5.0–8.0)

## 2018-01-21 LAB — CBC
HCT: 36.5 % — ABNORMAL LOW (ref 39.0–52.0)
Hemoglobin: 12.1 g/dL — ABNORMAL LOW (ref 13.0–17.0)
MCH: 33.9 pg (ref 26.0–34.0)
MCHC: 33.2 g/dL (ref 30.0–36.0)
MCV: 102.2 fL — AB (ref 78.0–100.0)
PLATELETS: 199 10*3/uL (ref 150–400)
RBC: 3.57 MIL/uL — ABNORMAL LOW (ref 4.22–5.81)
RDW: 13.4 % (ref 11.5–15.5)
WBC: 5.6 10*3/uL (ref 4.0–10.5)

## 2018-01-21 LAB — COMPREHENSIVE METABOLIC PANEL
ALT: 49 U/L — AB (ref 0–44)
AST: 121 U/L — AB (ref 15–41)
Albumin: 3.2 g/dL — ABNORMAL LOW (ref 3.5–5.0)
Alkaline Phosphatase: 303 U/L — ABNORMAL HIGH (ref 38–126)
Anion gap: 14 (ref 5–15)
BUN: 10 mg/dL (ref 6–20)
CHLORIDE: 101 mmol/L (ref 98–111)
CO2: 18 mmol/L — ABNORMAL LOW (ref 22–32)
CREATININE: 0.79 mg/dL (ref 0.61–1.24)
Calcium: 9 mg/dL (ref 8.9–10.3)
GFR calc Af Amer: >60 mL/min (ref >60)
GLUCOSE: 106 mg/dL — AB (ref 70–99)
Potassium: 3.5 mmol/L (ref 3.5–5.1)
Sodium: 133 mmol/L — ABNORMAL LOW (ref 135–145)
Total Bilirubin: 1.1 mg/dL (ref 0.3–1.2)
Total Protein: 8.2 g/dL — ABNORMAL HIGH (ref 6.5–8.1)

## 2018-01-21 LAB — LIPASE, BLOOD: LIPASE: 31 U/L (ref 11–51)

## 2018-01-21 NOTE — ED Provider Notes (Signed)
MSE was initiated and I personally evaluated the patient and placed orders (if any) at  8:56 PM on January 21, 2018.  The patient appears stable so that the remainder of the MSE may be completed by another provider.  Patient placed in Quick Look pathway, seen and evaluated   Chief Complaint: abd pain  HPI:   57 year old male with past medical history of alcohol abuse, GERD, esophagitis, hypertension presents to ED for evaluation of epigastric abdominal pain that began today.  Patient appears intoxicated so is unable to give any further information.  He does state that he has had similar pain in the past.  Reports normal bowel movements.  Unable to tell me if he has had any vomiting.  ROS: abd pain (one)  Physical Exam:   Gen: No distress  Neuro: Awake and Alert  Skin: Warm    Focused Exam: generalized ttp of abdomen without rebound or guarding. Appears intoxicated.   Initiation of care has begun. The patient has been counseled on the process, plan, and necessity for staying for the completion/evaluation, and the remainder of the medical screening examination    Delia Heady, PA-C 01/21/18 2057    Dorie Rank, MD 01/21/18 2321

## 2018-01-21 NOTE — ED Triage Notes (Signed)
Pt here by EMS for abdominal pain that began this evening.  ETOH on board, ambulatory to the wheelchair.     EMS Vitals: 125/88, [id number], RR16, 99% room air, CBG 101

## 2018-01-22 ENCOUNTER — Encounter (HOSPITAL_COMMUNITY): Payer: Self-pay | Admitting: Emergency Medicine

## 2018-01-22 MED ORDER — FAMOTIDINE 20 MG PO TABS
20.0000 mg | ORAL_TABLET | Freq: Once | ORAL | Status: AC
  Administered 2018-01-22: 20 mg via ORAL
  Filled 2018-01-22: qty 1

## 2018-01-22 MED ORDER — GI COCKTAIL ~~LOC~~
30.0000 mL | Freq: Once | ORAL | Status: AC
  Administered 2018-01-22: 30 mL via ORAL
  Filled 2018-01-22: qty 30

## 2018-01-22 NOTE — ED Notes (Signed)
ED Provider at bedside. 
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
## 2018-01-22 NOTE — Discharge Instructions (Addendum)
STOP DRINKING ALCOHOL!!! You can take prilosec OTC twice a day for 2 weeks then once a day for your stomach pain.

## 2018-01-28 ENCOUNTER — Encounter (HOSPITAL_COMMUNITY): Payer: Self-pay | Admitting: Emergency Medicine

## 2018-01-28 ENCOUNTER — Emergency Department (HOSPITAL_COMMUNITY)
Admission: EM | Admit: 2018-01-28 | Discharge: 2018-01-29 | Disposition: A | Discharge status: Home / Self Care | Payer: Medicaid Other | Attending: Emergency Medicine | Admitting: Emergency Medicine

## 2018-01-28 DIAGNOSIS — Z79899 Other long term (current) drug therapy: Secondary | ICD-10-CM | POA: Diagnosis not present

## 2018-01-28 DIAGNOSIS — I10 Essential (primary) hypertension: Secondary | ICD-10-CM | POA: Insufficient documentation

## 2018-01-28 DIAGNOSIS — K2920 Alcoholic gastritis without bleeding: Secondary | ICD-10-CM

## 2018-01-28 DIAGNOSIS — I252 Old myocardial infarction: Secondary | ICD-10-CM | POA: Insufficient documentation

## 2018-01-28 DIAGNOSIS — Z7982 Long term (current) use of aspirin: Secondary | ICD-10-CM | POA: Diagnosis not present

## 2018-01-28 DIAGNOSIS — R197 Diarrhea, unspecified: Secondary | ICD-10-CM | POA: Insufficient documentation

## 2018-01-28 DIAGNOSIS — F1010 Alcohol abuse, uncomplicated: Secondary | ICD-10-CM | POA: Insufficient documentation

## 2018-01-28 DIAGNOSIS — R112 Nausea with vomiting, unspecified: Secondary | ICD-10-CM | POA: Diagnosis not present

## 2018-01-28 DIAGNOSIS — F17210 Nicotine dependence, cigarettes, uncomplicated: Secondary | ICD-10-CM | POA: Diagnosis not present

## 2018-01-28 DIAGNOSIS — R1013 Epigastric pain: Secondary | ICD-10-CM | POA: Diagnosis present

## 2018-01-28 LAB — CBC
HEMATOCRIT: 35.5 % — AB (ref 39.0–52.0)
HEMOGLOBIN: 11.7 g/dL — AB (ref 13.0–17.0)
MCH: 33.4 pg (ref 26.0–34.0)
MCHC: 33 g/dL (ref 30.0–36.0)
MCV: 101.4 fL — ABNORMAL HIGH (ref 78.0–100.0)
Platelets: 175 10*3/uL (ref 150–400)
RBC: 3.5 MIL/uL — ABNORMAL LOW (ref 4.22–5.81)
RDW: 13.3 % (ref 11.5–15.5)
WBC: 4.9 10*3/uL (ref 4.0–10.5)

## 2018-01-28 LAB — LIPASE, BLOOD: Lipase: 39 U/L (ref 11–51)

## 2018-01-28 LAB — COMPREHENSIVE METABOLIC PANEL
ALK PHOS: 327 U/L — AB (ref 38–126)
ALT: 54 U/L — ABNORMAL HIGH (ref 0–44)
ANION GAP: 11 (ref 5–15)
AST: 150 U/L — ABNORMAL HIGH (ref 15–41)
Albumin: 3 g/dL — ABNORMAL LOW (ref 3.5–5.0)
BILIRUBIN TOTAL: 1 mg/dL (ref 0.3–1.2)
BUN: 11 mg/dL (ref 6–20)
CALCIUM: 8.7 mg/dL — AB (ref 8.9–10.3)
CO2: 18 mmol/L — ABNORMAL LOW (ref 22–32)
Chloride: 103 mmol/L (ref 98–111)
Creatinine, Ser: 0.87 mg/dL (ref 0.61–1.24)
GFR calc Af Amer: >60 mL/min (ref >60)
GLUCOSE: 107 mg/dL — AB (ref 70–99)
POTASSIUM: 3.9 mmol/L (ref 3.5–5.1)
Sodium: 132 mmol/L — ABNORMAL LOW (ref 135–145)
TOTAL PROTEIN: 7.5 g/dL (ref 6.5–8.1)

## 2018-01-28 LAB — URINALYSIS, ROUTINE W REFLEX MICROSCOPIC
BILIRUBIN URINE: NEGATIVE
Glucose, UA: NEGATIVE mg/dL
Hgb urine dipstick: NEGATIVE
KETONES UR: NEGATIVE mg/dL
LEUKOCYTES UA: NEGATIVE
NITRITE: NEGATIVE
PH: 6 (ref 5.0–8.0)
Protein, ur: NEGATIVE mg/dL
Specific Gravity, Urine: 1.013 (ref 1.005–1.030)

## 2018-01-28 LAB — I-STAT TROPONIN, ED: TROPONIN I, POC: 0.01 ng/mL (ref 0.00–0.08)

## 2018-01-28 LAB — ETHANOL: Alcohol, Ethyl (B): 217 mg/dL — ABNORMAL HIGH (ref <10)

## 2018-01-28 MED ORDER — LACTATED RINGERS IV BOLUS
1000.0000 mL | Freq: Once | INTRAVENOUS | Status: AC
  Administered 2018-01-28: 1000 mL via INTRAVENOUS

## 2018-01-28 MED ORDER — FAMOTIDINE 20 MG PO TABS
20.0000 mg | ORAL_TABLET | Freq: Once | ORAL | Status: AC
  Administered 2018-01-28: 20 mg via ORAL
  Filled 2018-01-28: qty 1

## 2018-01-28 MED ORDER — ALUM & MAG HYDROXIDE-SIMETH 200-200-20 MG/5ML PO SUSP
30.0000 mL | Freq: Once | ORAL | Status: AC
  Administered 2018-01-28: 30 mL via ORAL
  Filled 2018-01-28: qty 30
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
## 2018-01-28 NOTE — ED Triage Notes (Signed)
Pt transported by EMS from fire department with c/o abd pain and distention, N/V today, diarrhea yesterday. VSS.

## 2018-01-29 NOTE — Discharge Instructions (Signed)
Return to the ED for any worsening or other concerns.  Take medications as previously prescribed.  Follow up with primary care doctor this week.  Decrease alcohol intake.

## 2018-01-29 NOTE — ED Notes (Signed)
Pt provided Sprite for PO trial, will provide sandwich if no n/v pain.

## 2018-01-29 NOTE — ED Notes (Signed)
Pt ambulated well in hallway w/o complaints

## 2018-01-29 NOTE — ED Notes (Signed)
Pt provided sandwich, applesauce and beverage. Pt reports not nausea or increased pain after initial beverage.

## 2018-01-29 NOTE — ED Provider Notes (Signed)
Patient signed out pending by mouth challenge and metabolization of alcohol.  Per nursing, patient has been tolerating fluids without difficulty.  He ambulates independently.  Suspect etiology of symptoms related to alcoholic gastritis.  Discharge paperwork printed and per prior provider.   [name], MD 01/29/18 [phone]

## 2018-02-04 ENCOUNTER — Other Ambulatory Visit: Payer: Self-pay

## 2018-02-04 ENCOUNTER — Encounter (HOSPITAL_COMMUNITY): Payer: Self-pay | Admitting: Emergency Medicine

## 2018-02-04 ENCOUNTER — Emergency Department (HOSPITAL_COMMUNITY)
Admission: EM | Admit: 2018-02-04 | Discharge: 2018-02-04 | Disposition: A | Discharge status: Home / Self Care | Payer: Medicaid Other | Attending: Emergency Medicine | Admitting: Emergency Medicine

## 2018-02-04 DIAGNOSIS — F17210 Nicotine dependence, cigarettes, uncomplicated: Secondary | ICD-10-CM | POA: Insufficient documentation

## 2018-02-04 DIAGNOSIS — R1013 Epigastric pain: Secondary | ICD-10-CM | POA: Insufficient documentation

## 2018-02-04 DIAGNOSIS — I10 Essential (primary) hypertension: Secondary | ICD-10-CM | POA: Diagnosis not present

## 2018-02-04 LAB — CBC
HEMATOCRIT: 36.1 % — AB (ref 39.0–52.0)
Hemoglobin: 12.2 g/dL — ABNORMAL LOW (ref 13.0–17.0)
MCH: 33.7 pg (ref 26.0–34.0)
MCHC: 33.8 g/dL (ref 30.0–36.0)
MCV: 99.7 fL (ref 78.0–100.0)
Platelets: 202 10*3/uL (ref 150–400)
RBC: 3.62 MIL/uL — ABNORMAL LOW (ref 4.22–5.81)
RDW: 13.7 % (ref 11.5–15.5)
WBC: 6 10*3/uL (ref 4.0–10.5)

## 2018-02-04 LAB — URINALYSIS, ROUTINE W REFLEX MICROSCOPIC
BACTERIA UA: NONE SEEN
Bilirubin Urine: NEGATIVE
Glucose, UA: NEGATIVE mg/dL
Hgb urine dipstick: NEGATIVE
Ketones, ur: NEGATIVE mg/dL
LEUKOCYTES UA: NEGATIVE
Nitrite: NEGATIVE
Protein, ur: NEGATIVE mg/dL
SPECIFIC GRAVITY, URINE: 1.003 — AB (ref 1.005–1.030)
pH: 6 (ref 5.0–8.0)

## 2018-02-04 LAB — COMPREHENSIVE METABOLIC PANEL
ALBUMIN: 3.3 g/dL — AB (ref 3.5–5.0)
ALK PHOS: 353 U/L — AB (ref 38–126)
ALT: 58 U/L — AB (ref 0–44)
AST: 164 U/L — AB (ref 15–41)
Anion gap: 11 (ref 5–15)
BILIRUBIN TOTAL: 1.6 mg/dL — AB (ref 0.3–1.2)
BUN: 10 mg/dL (ref 6–20)
CO2: 19 mmol/L — AB (ref 22–32)
Calcium: 9 mg/dL (ref 8.9–10.3)
Chloride: 99 mmol/L (ref 98–111)
Creatinine, Ser: 0.74 mg/dL (ref 0.61–1.24)
GFR calc Af Amer: >60 mL/min (ref >60)
Glucose, Bld: 92 mg/dL (ref 70–99)
Potassium: 3.8 mmol/L (ref 3.5–5.1)
Sodium: 129 mmol/L — ABNORMAL LOW (ref 135–145)
Total Protein: 8 g/dL (ref 6.5–8.1)

## 2018-02-04 LAB — LIPASE, BLOOD: Lipase: 34 U/L (ref 11–51)

## 2018-02-04 MED ORDER — GI COCKTAIL ~~LOC~~
30.0000 mL | Freq: Once | ORAL | Status: AC
  Administered 2018-02-04: 30 mL via ORAL
  Filled 2018-02-04: qty 30

## 2018-02-04 MED ORDER — KETOROLAC TROMETHAMINE 30 MG/ML IJ SOLN
15.0000 mg | Freq: Once | INTRAMUSCULAR | Status: AC
  Administered 2018-02-04: 15 mg via INTRAMUSCULAR
  Filled 2018-02-04: qty 1
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
## 2018-02-04 NOTE — Discharge Instructions (Addendum)
As discussed, your evaluation today has been largely reassuring.  But, it is important that you monitor your condition carefully, and do not hesitate to return to the ED if you develop new, or concerning changes in your condition. ? ?Otherwise, please follow-up with your physician for appropriate ongoing care. ? ?

## 2018-02-04 NOTE — ED Triage Notes (Signed)
Patient with abdominal pain and distention with nausea and vomiting that started today.

## 2018-02-08 ENCOUNTER — Encounter (HOSPITAL_COMMUNITY): Payer: Self-pay | Admitting: *Deleted

## 2018-02-08 ENCOUNTER — Other Ambulatory Visit: Payer: Self-pay

## 2018-02-08 ENCOUNTER — Emergency Department (HOSPITAL_COMMUNITY)
Admission: EM | Admit: 2018-02-08 | Discharge: 2018-02-09 | Disposition: A | Discharge status: Home / Self Care | Payer: Medicaid Other | Attending: Emergency Medicine | Admitting: Emergency Medicine

## 2018-02-08 DIAGNOSIS — F17210 Nicotine dependence, cigarettes, uncomplicated: Secondary | ICD-10-CM | POA: Insufficient documentation

## 2018-02-08 DIAGNOSIS — R1084 Generalized abdominal pain: Secondary | ICD-10-CM | POA: Diagnosis not present

## 2018-02-08 DIAGNOSIS — I252 Old myocardial infarction: Secondary | ICD-10-CM | POA: Insufficient documentation

## 2018-02-08 DIAGNOSIS — I10 Essential (primary) hypertension: Secondary | ICD-10-CM | POA: Insufficient documentation

## 2018-02-08 DIAGNOSIS — E785 Hyperlipidemia, unspecified: Secondary | ICD-10-CM | POA: Diagnosis not present

## 2018-02-08 DIAGNOSIS — Z79899 Other long term (current) drug therapy: Secondary | ICD-10-CM | POA: Insufficient documentation

## 2018-02-08 DIAGNOSIS — F1010 Alcohol abuse, uncomplicated: Secondary | ICD-10-CM | POA: Diagnosis not present

## 2018-02-08 LAB — CBC
HEMATOCRIT: 37.9 % — AB (ref 39.0–52.0)
Hemoglobin: 12.9 g/dL — ABNORMAL LOW (ref 13.0–17.0)
MCH: 34.3 pg — AB (ref 26.0–34.0)
MCHC: 34 g/dL (ref 30.0–36.0)
MCV: 100.8 fL — ABNORMAL HIGH (ref 80.0–100.0)
Platelets: 190 10*3/uL (ref 150–400)
RBC: 3.76 MIL/uL — AB (ref 4.22–5.81)
RDW: 14.5 % (ref 11.5–15.5)
WBC: 5.6 10*3/uL (ref 4.0–10.5)
nRBC: 0 % (ref 0.0–0.2)

## 2018-02-08 LAB — URINALYSIS, ROUTINE W REFLEX MICROSCOPIC
Bilirubin Urine: NEGATIVE
Glucose, UA: NEGATIVE mg/dL
HGB URINE DIPSTICK: NEGATIVE
Ketones, ur: NEGATIVE mg/dL
Leukocytes, UA: NEGATIVE
NITRITE: NEGATIVE
PH: 6 (ref 5.0–8.0)
Protein, ur: NEGATIVE mg/dL
SPECIFIC GRAVITY, URINE: 1.002 — AB (ref 1.005–1.030)

## 2018-02-08 LAB — COMPREHENSIVE METABOLIC PANEL
ALBUMIN: 3.3 g/dL — AB (ref 3.5–5.0)
ALT: 60 U/L — ABNORMAL HIGH (ref 0–44)
ANION GAP: 12 (ref 5–15)
AST: 179 U/L — AB (ref 15–41)
Alkaline Phosphatase: 395 U/L — ABNORMAL HIGH (ref 38–126)
BUN: 10 mg/dL (ref 6–20)
CHLORIDE: 101 mmol/L (ref 98–111)
CO2: 22 mmol/L (ref 22–32)
Calcium: 8.8 mg/dL — ABNORMAL LOW (ref 8.9–10.3)
Creatinine, Ser: 0.74 mg/dL (ref 0.61–1.24)
GFR calc Af Amer: >60 mL/min (ref >60)
GFR calc non Af Amer: >60 mL/min (ref >60)
GLUCOSE: 97 mg/dL (ref 70–99)
Potassium: 4 mmol/L (ref 3.5–5.1)
SODIUM: 135 mmol/L (ref 135–145)
Total Bilirubin: 1.8 mg/dL — ABNORMAL HIGH (ref 0.3–1.2)
Total Protein: 8.2 g/dL — ABNORMAL HIGH (ref 6.5–8.1)

## 2018-02-08 LAB — LIPASE, BLOOD: LIPASE: 38 U/L (ref 11–51)

## 2018-02-08 NOTE — ED Triage Notes (Signed)
Per [facility], pt c/o abd pain, nausea with ETOH onboard.

## 2018-02-09 MED ORDER — ONDANSETRON 8 MG PO TBDP
8.0000 mg | ORAL_TABLET | Freq: Once | ORAL | Status: AC
  Administered 2018-02-09: 8 mg via ORAL
  Filled 2018-02-09: qty 1
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
## 2018-02-11 ENCOUNTER — Encounter: Payer: Self-pay | Admitting: Internal Medicine

## 2018-02-16 ENCOUNTER — Encounter (HOSPITAL_COMMUNITY): Payer: Self-pay | Admitting: Emergency Medicine

## 2018-02-16 ENCOUNTER — Other Ambulatory Visit: Payer: Self-pay

## 2018-02-16 ENCOUNTER — Emergency Department (HOSPITAL_COMMUNITY)
Admission: EM | Admit: 2018-02-16 | Discharge: 2018-02-17 | Disposition: A | Discharge status: Home / Self Care | Payer: Medicaid Other | Attending: Emergency Medicine | Admitting: Emergency Medicine

## 2018-02-16 DIAGNOSIS — R1011 Right upper quadrant pain: Secondary | ICD-10-CM | POA: Diagnosis present

## 2018-02-16 DIAGNOSIS — I10 Essential (primary) hypertension: Secondary | ICD-10-CM | POA: Insufficient documentation

## 2018-02-16 DIAGNOSIS — F17210 Nicotine dependence, cigarettes, uncomplicated: Secondary | ICD-10-CM | POA: Diagnosis not present

## 2018-02-16 DIAGNOSIS — I252 Old myocardial infarction: Secondary | ICD-10-CM | POA: Diagnosis not present

## 2018-02-16 DIAGNOSIS — K2920 Alcoholic gastritis without bleeding: Secondary | ICD-10-CM | POA: Diagnosis not present

## 2018-02-16 LAB — URINALYSIS, ROUTINE W REFLEX MICROSCOPIC
BILIRUBIN URINE: NEGATIVE
Glucose, UA: NEGATIVE mg/dL
KETONES UR: NEGATIVE mg/dL
LEUKOCYTES UA: NEGATIVE
Nitrite: NEGATIVE
Protein, ur: NEGATIVE mg/dL
SPECIFIC GRAVITY, URINE: 1.019 (ref 1.005–1.030)
pH: 5 (ref 5.0–8.0)

## 2018-02-16 LAB — COMPREHENSIVE METABOLIC PANEL
ALBUMIN: 2.9 g/dL — AB (ref 3.5–5.0)
ALT: 80 U/L — ABNORMAL HIGH (ref 0–44)
AST: 294 U/L — ABNORMAL HIGH (ref 15–41)
Alkaline Phosphatase: 451 U/L — ABNORMAL HIGH (ref 38–126)
Anion gap: 11 (ref 5–15)
BILIRUBIN TOTAL: 2.7 mg/dL — AB (ref 0.3–1.2)
BUN: 8 mg/dL (ref 6–20)
CALCIUM: 8.9 mg/dL (ref 8.9–10.3)
CO2: 20 mmol/L — ABNORMAL LOW (ref 22–32)
Chloride: 99 mmol/L (ref 98–111)
Creatinine, Ser: 1.24 mg/dL (ref 0.61–1.24)
GFR calc Af Amer: >60 mL/min (ref >60)
GFR calc non Af Amer: >60 mL/min (ref >60)
GLUCOSE: 116 mg/dL — AB (ref 70–99)
POTASSIUM: 3.9 mmol/L (ref 3.5–5.1)
SODIUM: 130 mmol/L — AB (ref 135–145)
Total Protein: 7.6 g/dL (ref 6.5–8.1)

## 2018-02-16 LAB — CBC
HEMATOCRIT: 32.6 % — AB (ref 39.0–52.0)
HEMOGLOBIN: 11.3 g/dL — AB (ref 13.0–17.0)
MCH: 33.2 pg (ref 26.0–34.0)
MCHC: 34.7 g/dL (ref 30.0–36.0)
MCV: 95.9 fL (ref 80.0–100.0)
NRBC: 0 % (ref 0.0–0.2)
PLATELETS: 117 10*3/uL — AB (ref 150–400)
RBC: 3.4 MIL/uL — AB (ref 4.22–5.81)
RDW: 14.3 % (ref 11.5–15.5)
WBC: 5.7 10*3/uL (ref 4.0–10.5)

## 2018-02-16 LAB — LIPASE, BLOOD: Lipase: 48 U/L (ref 11–51)

## 2018-02-16 MED ORDER — ONDANSETRON 4 MG PO TBDP
4.0000 mg | ORAL_TABLET | Freq: Once | ORAL | Status: AC | PRN
  Administered 2018-02-16: 4 mg via ORAL
  Filled 2018-02-16: qty 1

## 2018-02-16 NOTE — ED Triage Notes (Signed)
Pt has has n/v with po intake x 2 hours.

## 2018-02-16 NOTE — ED Notes (Signed)
Due to patient's continued tachycardia and irregular labs, will upgrade acuity and prioritize rooming.

## 2018-02-17 ENCOUNTER — Emergency Department (HOSPITAL_COMMUNITY): Payer: Medicaid Other

## 2018-02-17 MED ORDER — METOCLOPRAMIDE HCL 5 MG/ML IJ SOLN
10.0000 mg | Freq: Once | INTRAMUSCULAR | Status: AC
  Administered 2018-02-17: 10 mg via INTRAVENOUS
  Filled 2018-02-17: qty 2

## 2018-02-17 MED ORDER — SODIUM CHLORIDE 0.9 % IV BOLUS
1000.0000 mL | Freq: Once | INTRAVENOUS | Status: AC
  Administered 2018-02-17: 1000 mL via INTRAVENOUS

## 2018-02-17 NOTE — ED Notes (Signed)
Patient transported to Ultrasound 
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
## 2018-02-17 NOTE — ED Notes (Signed)
Pt tolerating PO intake well.

## 2018-03-03 ENCOUNTER — Inpatient Hospital Stay (HOSPITAL_COMMUNITY)
Admission: EM | Admit: 2018-03-03 | Discharge: 2018-03-05 | DRG: 378 | Disposition: A | Discharge status: Home / Self Care | Payer: Medicaid Other | Attending: Student in an Organized Health Care Education/Training Program | Admitting: Student in an Organized Health Care Education/Training Program

## 2018-03-03 ENCOUNTER — Emergency Department (HOSPITAL_COMMUNITY): Payer: Medicaid Other

## 2018-03-03 ENCOUNTER — Other Ambulatory Visit: Payer: Self-pay

## 2018-03-03 ENCOUNTER — Encounter (HOSPITAL_COMMUNITY): Payer: Self-pay

## 2018-03-03 DIAGNOSIS — R195 Other fecal abnormalities: Secondary | ICD-10-CM

## 2018-03-03 DIAGNOSIS — K648 Other hemorrhoids: Secondary | ICD-10-CM

## 2018-03-03 DIAGNOSIS — E440 Moderate protein-calorie malnutrition: Secondary | ICD-10-CM | POA: Diagnosis present

## 2018-03-03 DIAGNOSIS — F17210 Nicotine dependence, cigarettes, uncomplicated: Secondary | ICD-10-CM | POA: Diagnosis present

## 2018-03-03 DIAGNOSIS — Z599 Problem related to housing and economic circumstances, unspecified: Secondary | ICD-10-CM

## 2018-03-03 DIAGNOSIS — R109 Unspecified abdominal pain: Secondary | ICD-10-CM | POA: Diagnosis present

## 2018-03-03 DIAGNOSIS — Z5900 Homelessness unspecified: Secondary | ICD-10-CM

## 2018-03-03 DIAGNOSIS — K5900 Constipation, unspecified: Secondary | ICD-10-CM | POA: Diagnosis present

## 2018-03-03 DIAGNOSIS — K921 Melena: Principal | ICD-10-CM | POA: Diagnosis present

## 2018-03-03 DIAGNOSIS — R1032 Left lower quadrant pain: Secondary | ICD-10-CM

## 2018-03-03 DIAGNOSIS — R0789 Other chest pain: Secondary | ICD-10-CM | POA: Diagnosis present

## 2018-03-03 DIAGNOSIS — F1020 Alcohol dependence, uncomplicated: Secondary | ICD-10-CM | POA: Diagnosis present

## 2018-03-03 DIAGNOSIS — D649 Anemia, unspecified: Secondary | ICD-10-CM | POA: Diagnosis present

## 2018-03-03 DIAGNOSIS — Z6820 Body mass index (BMI) 20.0-20.9, adult: Secondary | ICD-10-CM | POA: Diagnosis not present

## 2018-03-03 DIAGNOSIS — I10 Essential (primary) hypertension: Secondary | ICD-10-CM | POA: Diagnosis present

## 2018-03-03 DIAGNOSIS — Z79899 Other long term (current) drug therapy: Secondary | ICD-10-CM | POA: Diagnosis not present

## 2018-03-03 DIAGNOSIS — K219 Gastro-esophageal reflux disease without esophagitis: Secondary | ICD-10-CM

## 2018-03-03 DIAGNOSIS — K649 Unspecified hemorrhoids: Secondary | ICD-10-CM | POA: Diagnosis not present

## 2018-03-03 DIAGNOSIS — K635 Polyp of colon: Secondary | ICD-10-CM | POA: Diagnosis not present

## 2018-03-03 DIAGNOSIS — G8929 Other chronic pain: Secondary | ICD-10-CM | POA: Diagnosis present

## 2018-03-03 DIAGNOSIS — I252 Old myocardial infarction: Secondary | ICD-10-CM | POA: Diagnosis not present

## 2018-03-03 DIAGNOSIS — E785 Hyperlipidemia, unspecified: Secondary | ICD-10-CM | POA: Diagnosis present

## 2018-03-03 DIAGNOSIS — M109 Gout, unspecified: Secondary | ICD-10-CM | POA: Diagnosis present

## 2018-03-03 DIAGNOSIS — R162 Hepatomegaly with splenomegaly, not elsewhere classified: Secondary | ICD-10-CM | POA: Diagnosis not present

## 2018-03-03 DIAGNOSIS — K709 Alcoholic liver disease, unspecified: Secondary | ICD-10-CM | POA: Diagnosis not present

## 2018-03-03 DIAGNOSIS — Z23 Encounter for immunization: Secondary | ICD-10-CM

## 2018-03-03 DIAGNOSIS — R1031 Right lower quadrant pain: Secondary | ICD-10-CM | POA: Diagnosis present

## 2018-03-03 DIAGNOSIS — R7401 Elevation of levels of liver transaminase levels: Secondary | ICD-10-CM | POA: Diagnosis present

## 2018-03-03 DIAGNOSIS — R1011 Right upper quadrant pain: Secondary | ICD-10-CM | POA: Diagnosis present

## 2018-03-03 DIAGNOSIS — R740 Nonspecific elevation of levels of transaminase and lactic acid dehydrogenase [LDH]: Secondary | ICD-10-CM

## 2018-03-03 DIAGNOSIS — K7030 Alcoholic cirrhosis of liver without ascites: Secondary | ICD-10-CM | POA: Diagnosis present

## 2018-03-03 DIAGNOSIS — K2980 Duodenitis without bleeding: Secondary | ICD-10-CM

## 2018-03-03 HISTORY — DX: Depression, unspecified: F32.A

## 2018-03-03 HISTORY — DX: Personal history of other diseases of the musculoskeletal system and connective tissue: Z87.39

## 2018-03-03 HISTORY — DX: Major depressive disorder, single episode, unspecified: F32.9

## 2018-03-03 HISTORY — DX: Unspecified osteoarthritis, unspecified site: M19.90

## 2018-03-03 HISTORY — DX: Personal history of other medical treatment: Z92.89

## 2018-03-03 HISTORY — DX: Cardiac murmur, unspecified: R01.1

## 2018-03-03 LAB — COMPREHENSIVE METABOLIC PANEL
ALK PHOS: 448 U/L — AB (ref 38–126)
ALT: 51 U/L — AB (ref 0–44)
ANION GAP: 9 (ref 5–15)
AST: 177 U/L — AB (ref 15–41)
Albumin: 2.4 g/dL — ABNORMAL LOW (ref 3.5–5.0)
BILIRUBIN TOTAL: 3.6 mg/dL — AB (ref 0.3–1.2)
BUN: 10 mg/dL (ref 6–20)
CALCIUM: 8.4 mg/dL — AB (ref 8.9–10.3)
CO2: 20 mmol/L — AB (ref 22–32)
Chloride: 105 mmol/L (ref 98–111)
Creatinine, Ser: 0.83 mg/dL (ref 0.61–1.24)
GFR calc Af Amer: >60 mL/min (ref >60)
GFR calc non Af Amer: >60 mL/min (ref >60)
GLUCOSE: 106 mg/dL — AB (ref 70–99)
Potassium: 3.8 mmol/L (ref 3.5–5.1)
SODIUM: 134 mmol/L — AB (ref 135–145)
TOTAL PROTEIN: 7.2 g/dL (ref 6.5–8.1)

## 2018-03-03 LAB — URINALYSIS, ROUTINE W REFLEX MICROSCOPIC
Bilirubin Urine: NEGATIVE
Glucose, UA: NEGATIVE mg/dL
Hgb urine dipstick: NEGATIVE
KETONES UR: NEGATIVE mg/dL
Leukocytes, UA: NEGATIVE
Nitrite: NEGATIVE
Protein, ur: NEGATIVE mg/dL
SPECIFIC GRAVITY, URINE: 1.018 (ref 1.005–1.030)
pH: 5 (ref 5.0–8.0)

## 2018-03-03 LAB — POC OCCULT BLOOD, ED: FECAL OCCULT BLD: POSITIVE — AB

## 2018-03-03 LAB — PROTIME-INR
INR: 1.42
Prothrombin Time: 17.2 seconds — ABNORMAL HIGH (ref 11.4–15.2)

## 2018-03-03 LAB — TYPE AND SCREEN
ABO/RH(D): O POS
Antibody Screen: NEGATIVE

## 2018-03-03 LAB — CBC
HEMATOCRIT: 29.2 % — AB (ref 39.0–52.0)
HEMOGLOBIN: 9.5 g/dL — AB (ref 13.0–17.0)
MCH: 32.9 pg (ref 26.0–34.0)
MCHC: 32.5 g/dL (ref 30.0–36.0)
MCV: 101 fL — ABNORMAL HIGH (ref 80.0–100.0)
NRBC: 0.3 % — AB (ref 0.0–0.2)
Platelets: 178 10*3/uL (ref 150–400)
RBC: 2.89 MIL/uL — ABNORMAL LOW (ref 4.22–5.81)
RDW: 19.6 % — AB (ref 11.5–15.5)
WBC: 5.9 10*3/uL (ref 4.0–10.5)

## 2018-03-03 LAB — LIPASE, BLOOD: Lipase: 51 U/L (ref 11–51)

## 2018-03-03 LAB — TROPONIN I: TROPONIN I: 0.04 ng/mL — AB (ref <0.03)

## 2018-03-03 LAB — I-STAT TROPONIN, ED
TROPONIN I, POC: 0 ng/mL (ref 0.00–0.08)
Troponin i, poc: 0 ng/mL (ref 0.00–0.08)

## 2018-03-03 MED ORDER — STERILE WATER FOR INJECTION IJ SOLN
INTRAMUSCULAR | Status: AC
  Administered 2018-03-03: 10 mL
  Filled 2018-03-03: qty 10

## 2018-03-03 MED ORDER — SODIUM CHLORIDE 0.9 % IV BOLUS
1000.0000 mL | Freq: Once | INTRAVENOUS | Status: AC
  Administered 2018-03-03: 1000 mL via INTRAVENOUS

## 2018-03-03 MED ORDER — ALUM & MAG HYDROXIDE-SIMETH 200-200-20 MG/5ML PO SUSP
30.0000 mL | Freq: Once | ORAL | Status: AC
  Administered 2018-03-03: 30 mL via ORAL
  Filled 2018-03-03: qty 30

## 2018-03-03 MED ORDER — FOLIC ACID 1 MG PO TABS
1.0000 mg | ORAL_TABLET | Freq: Every day | ORAL | Status: DC
  Administered 2018-03-03 – 2018-03-05 (×3): 1 mg via ORAL
  Filled 2018-03-03 (×3): qty 1

## 2018-03-03 MED ORDER — VITAMIN B-1 100 MG PO TABS
100.0000 mg | ORAL_TABLET | Freq: Every day | ORAL | Status: DC
  Administered 2018-03-03 – 2018-03-05 (×3): 100 mg via ORAL
  Filled 2018-03-03 (×3): qty 1

## 2018-03-03 MED ORDER — ONDANSETRON HCL 4 MG/2ML IJ SOLN
4.0000 mg | Freq: Once | INTRAMUSCULAR | Status: AC
  Administered 2018-03-03: 4 mg via INTRAVENOUS
  Filled 2018-03-03: qty 2

## 2018-03-03 MED ORDER — ASPIRIN 81 MG PO CHEW: 324.0000 mg | CHEWABLE_TABLET | Freq: Once | ORAL | Status: DC

## 2018-03-03 MED ORDER — IOHEXOL 300 MG/ML  SOLN
100.0000 mL | Freq: Once | INTRAMUSCULAR | Status: AC | PRN
  Administered 2018-03-03: 100 mL via INTRAVENOUS

## 2018-03-03 MED ORDER — FAMOTIDINE IN NACL 20-0.9 MG/50ML-% IV SOLN
20.0000 mg | Freq: Once | INTRAVENOUS | Status: AC
  Administered 2018-03-03: 20 mg via INTRAVENOUS
  Filled 2018-03-03: qty 50

## 2018-03-03 MED ORDER — SODIUM CHLORIDE 0.9% FLUSH
3.0000 mL | Freq: Two times a day (BID) | INTRAVENOUS | Status: DC
  Administered 2018-03-03 – 2018-03-05 (×4): 3 mL via INTRAVENOUS

## 2018-03-03 MED ORDER — ONDANSETRON HCL 4 MG/2ML IJ SOLN: 4.0000 mg | Freq: Four times a day (QID) | INTRAMUSCULAR | Status: DC | PRN

## 2018-03-03 MED ORDER — ADULT MULTIVITAMIN W/MINERALS CH
1.0000 | ORAL_TABLET | Freq: Every day | ORAL | Status: DC
  Administered 2018-03-03 – 2018-03-05 (×3): 1 via ORAL
  Filled 2018-03-03 (×3): qty 1

## 2018-03-03 MED ORDER — ACETAMINOPHEN 325 MG PO TABS
650.0000 mg | ORAL_TABLET | Freq: Four times a day (QID) | ORAL | Status: DC | PRN
  Administered 2018-03-04: 650 mg via ORAL
  Filled 2018-03-03: qty 2

## 2018-03-03 MED ORDER — SODIUM CHLORIDE 0.9 % IV SOLN
8.0000 mg/h | INTRAVENOUS | Status: DC
  Administered 2018-03-03: 8 mg/h via INTRAVENOUS
  Filled 2018-03-03 (×2): qty 80

## 2018-03-03 MED ORDER — METOPROLOL SUCCINATE ER 100 MG PO TB24
100.0000 mg | ORAL_TABLET | Freq: Every day | ORAL | Status: DC
  Filled 2018-03-03: qty 1

## 2018-03-03 MED ORDER — ACETAMINOPHEN 650 MG RE SUPP: 650.0000 mg | Freq: Four times a day (QID) | RECTAL | Status: DC | PRN

## 2018-03-03 MED ORDER — ONDANSETRON HCL 4 MG PO TABS: 4.0000 mg | ORAL_TABLET | Freq: Four times a day (QID) | ORAL | Status: DC | PRN

## 2018-03-03 MED ORDER — DICYCLOMINE HCL 10 MG PO CAPS
10.0000 mg | ORAL_CAPSULE | Freq: Once | ORAL | Status: AC
  Administered 2018-03-03: 10 mg via ORAL
  Filled 2018-03-03: qty 1

## 2018-03-03 MED ORDER — ENOXAPARIN SODIUM 40 MG/0.4ML ~~LOC~~ SOLN
40.0000 mg | SUBCUTANEOUS | Status: DC
  Administered 2018-03-03 – 2018-03-04 (×2): 40 mg via SUBCUTANEOUS
  Filled 2018-03-03 (×2): qty 0.4

## 2018-03-03 MED ORDER — PANTOPRAZOLE SODIUM 40 MG IV SOLR
40.0000 mg | Freq: Once | INTRAVENOUS | Status: AC
  Administered 2018-03-03: 40 mg via INTRAVENOUS
  Filled 2018-03-03: qty 40
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
## 2018-03-03 NOTE — ED Notes (Signed)
Pt states his chest pain is 5/10 and abdominal pain is 7/10

## 2018-03-03 NOTE — Progress Notes (Signed)
New Admission Note: 03/03/18 6:45 PM   Arrival Method:  stretcher Mental Orientation: alert and oriented x 4 Telemetry: box 6 applied, central telemetry verified x 2 Assessment: complete Skin: assessed by 2 RNs IV:  Left forearm 20 gauge infusing pantoprazole at 25 ml/hr, L AC NSL Pain: 9/10 abdomen Tubes: none Safety Measures: bed low, call bell in reach, safety video viewed Admission: completed 28M Orientation: done Family: no one at bedside Personal Belongings: clothes and bookbag  Orders have been reviewed and implemented. Will continue to monitor the patient. Call light has been placed within reach and bed alarm has been activated.    Manya Silvas, RN MSN [place] Phone number: [phone]

## 2018-03-03 NOTE — ED Notes (Signed)
Patient transported to CT 

## 2018-03-03 NOTE — ED Triage Notes (Addendum)
Pt arrives via ems with c/o of abd pain vomiting and cp  X 1 1/2 hours was seen for same last week is non compliant with meds due to inabiklity to obtain pt given  1 nitro and 324 asa per ems pt has 18 left ac

## 2018-03-04 DIAGNOSIS — K921 Melena: Principal | ICD-10-CM

## 2018-03-04 DIAGNOSIS — K709 Alcoholic liver disease, unspecified: Secondary | ICD-10-CM

## 2018-03-04 DIAGNOSIS — K649 Unspecified hemorrhoids: Secondary | ICD-10-CM

## 2018-03-04 DIAGNOSIS — R162 Hepatomegaly with splenomegaly, not elsewhere classified: Secondary | ICD-10-CM

## 2018-03-04 DIAGNOSIS — K5900 Constipation, unspecified: Secondary | ICD-10-CM

## 2018-03-04 DIAGNOSIS — Z7289 Other problems related to lifestyle: Secondary | ICD-10-CM

## 2018-03-04 LAB — COMPREHENSIVE METABOLIC PANEL
ALT: 52 U/L — ABNORMAL HIGH (ref 0–44)
AST: 173 U/L — AB (ref 15–41)
Albumin: 2.5 g/dL — ABNORMAL LOW (ref 3.5–5.0)
Alkaline Phosphatase: 411 U/L — ABNORMAL HIGH (ref 38–126)
Anion gap: 8 (ref 5–15)
BUN: <5 mg/dL — ABNORMAL LOW (ref 6–20)
CHLORIDE: 104 mmol/L (ref 98–111)
CO2: 24 mmol/L (ref 22–32)
Calcium: 8.5 mg/dL — ABNORMAL LOW (ref 8.9–10.3)
Creatinine, Ser: 0.87 mg/dL (ref 0.61–1.24)
Glucose, Bld: 110 mg/dL — ABNORMAL HIGH (ref 70–99)
Potassium: 3.5 mmol/L (ref 3.5–5.1)
SODIUM: 136 mmol/L (ref 135–145)
Total Bilirubin: 4.2 mg/dL — ABNORMAL HIGH (ref 0.3–1.2)
Total Protein: 7.7 g/dL (ref 6.5–8.1)

## 2018-03-04 LAB — CBC
HEMATOCRIT: 30.9 % — AB (ref 39.0–52.0)
Hemoglobin: 9.9 g/dL — ABNORMAL LOW (ref 13.0–17.0)
MCH: 32 pg (ref 26.0–34.0)
MCHC: 32 g/dL (ref 30.0–36.0)
MCV: 100 fL (ref 80.0–100.0)
NRBC: 0.6 % — AB (ref 0.0–0.2)
Platelets: 162 10*3/uL (ref 150–400)
RBC: 3.09 MIL/uL — ABNORMAL LOW (ref 4.22–5.81)
RDW: 19.9 % — ABNORMAL HIGH (ref 11.5–15.5)
WBC: 5.3 10*3/uL (ref 4.0–10.5)

## 2018-03-04 LAB — PROTIME-INR
INR: 1.37
Prothrombin Time: 16.7 seconds — ABNORMAL HIGH (ref 11.4–15.2)

## 2018-03-04 LAB — TROPONIN I
TROPONIN I: 0.04 ng/mL — AB (ref <0.03)
TROPONIN I: 0.03 ng/mL — AB (ref <0.03)
Troponin I: 0.03 ng/mL (ref <0.03)

## 2018-03-04 MED ORDER — ALUM & MAG HYDROXIDE-SIMETH 200-200-20 MG/5ML PO SUSP
30.0000 mL | Freq: Once | ORAL | Status: AC
  Administered 2018-03-04: 30 mL via ORAL
  Filled 2018-03-04: qty 30

## 2018-03-04 MED ORDER — LACTULOSE 10 GM/15ML PO SOLN: 20.0000 g | Freq: Three times a day (TID) | ORAL | Status: DC | PRN

## 2018-03-04 MED ORDER — WITCH HAZEL-GLYCERIN EX PADS
MEDICATED_PAD | CUTANEOUS | Status: DC | PRN
  Administered 2018-03-05: 10:00:00 via TOPICAL
  Filled 2018-03-04: qty 100

## 2018-03-04 MED ORDER — ENSURE ENLIVE PO LIQD
237.0000 mL | Freq: Three times a day (TID) | ORAL | Status: DC
  Administered 2018-03-04 – 2018-03-05 (×4): 237 mL via ORAL

## 2018-03-04 NOTE — Evaluation (Signed)
Physical Therapy Evaluation and Discharge Patient Details Name: Devin [name] MRN: [id number] DOB: 09-08-1960 Today's Date: 03/04/2018   History of Present Illness  Pt is an un-housed 57 yo male with a PMHx of ETOH use disorder, alcoholic liver disease, GERD, HTN, HLD, MI presenting to the ED with chest pain, abdominal pain, vomiting and bloody stools the past week. In the ED, he was found to be hypertensive and tachycardic. Imaging negative for acute changes.  Clinical Impression  Patient evaluated by Physical Therapy with no further acute PT needs identified. All education has been completed and the patient has no further questions. At the time of PT eval pt was able to perform transfers and ambulation with gross modified independence and no AD. Pt reports mild pain/discomfort in his abdomen during session and did not seem to be limited throughout OOB mobility. Pt reports he has been independent in the room. Although pt does not know where exactly he will be discharging to, do not feel he will require DME for safety, and pt completed stair training without difficulty. At this time, do not feel this patient requires continued acute rehab services and pt agreed when we discussed PT signing off at this time. See below for any follow-up Physical Therapy or equipment needs. If needs change, please reconsult. Thank you for this referral.     Follow Up Recommendations No PT follow up;Supervision - Intermittent    Equipment Recommendations  None recommended by PT    Recommendations for Other Services       Precautions / Restrictions Precautions Precautions: Fall Restrictions Weight Bearing Restrictions: No      Mobility  Bed Mobility Overal bed mobility: Modified Independent                Transfers Overall transfer level: Modified independent Equipment used: None                Ambulation/Gait Ambulation/Gait assistance: Modified independent (Device/Increase time) Gait  Distance (Feet): 750 Feet Assistive device: None Gait Pattern/deviations: WFL(Within Functional Limits)   Gait velocity interpretation: >2.62 ft/sec, indicative of community ambulatory General Gait Details: No unsteadiness or LOB noted.   Stairs Stairs: Yes Stairs assistance: Modified independent (Device/Increase time) Stair Management: One rail Right;Alternating pattern;Forwards Number of Stairs: 10 General stair comments: No assist required. No difficulty noted however HR did increase to 122 bpm after stair training.   Wheelchair Mobility    Modified Rankin (Stroke Patients Only)       Balance Overall balance assessment: Modified Independent                               Standardized Balance Assessment Standardized Balance Assessment : Dynamic Gait Index   Dynamic Gait Index Level Surface: Normal Change in Gait Speed: Normal Gait with Horizontal Head Turns: Normal Gait with Vertical Head Turns: Normal Gait and Pivot Turn: Normal Step Over Obstacle: Normal Step Around Obstacles: Normal Steps: Normal Total Score: 24       Pertinent Vitals/Pain Pain Assessment: Faces Faces Pain Scale: Hurts a little bit Pain Location: Abdomen  Pain Descriptors / Indicators: Aching;Grimacing Pain Intervention(s): Monitored during session    Home Living Family/patient expects to be discharged to:: Shelter/Homeless                 Additional Comments: Pt reports he does not have a place to stay at this time and is interested in a "detox program" at d/c.  Prior Function Level of Independence: Independent               Hand Dominance        Extremity/Trunk Assessment   Upper Extremity Assessment Upper Extremity Assessment: Overall WFL for tasks assessed    Lower Extremity Assessment Lower Extremity Assessment: Overall WFL for tasks assessed    Cervical / Trunk Assessment Cervical / Trunk Assessment: Other exceptions Cervical / Trunk  Exceptions: Forward head with rounded shoulders postuer  Communication   Communication: No difficulties  Cognition Arousal/Alertness: Awake/alert Behavior During Therapy: WFL for tasks assessed/performed Overall Cognitive Status: Within Functional Limits for tasks assessed                                        General Comments      Exercises     Assessment/Plan    PT Assessment Patent does not need any further PT services  PT Problem List         PT Treatment Interventions      PT Goals (Current goals can be found in the Care Plan section)  Acute Rehab PT Goals Patient Stated Goal: Stop bleeding from his rectum PT Goal Formulation: All assessment and education complete, DC therapy    Frequency     Barriers to discharge        Co-evaluation               AM-PAC PT "6 Clicks" Daily Activity  Outcome Measure Difficulty turning over in bed (including adjusting bedclothes, sheets and blankets)?: None Difficulty moving from lying on back to sitting on the side of the bed? : None Difficulty sitting down on and standing up from a chair with arms (e.g., wheelchair, bedside commode, etc,.)?: None Help needed moving to and from a bed to chair (including a wheelchair)?: None Help needed walking in hospital room?: None Help needed climbing 3-5 steps with a railing? : None 6 Click Score: 24    End of Session   Activity Tolerance: Patient tolerated treatment well Patient left: in bed;with call bell/phone within reach Nurse Communication: Mobility status PT Visit Diagnosis: Pain;Difficulty in walking, not elsewhere classified (R26.2) Pain - part of body: (Abdomen)    Time: 2633-3545 PT Time Calculation (min) (ACUTE ONLY): 23 min   Charges:   PT Evaluation $PT Eval Moderate Complexity: 1 Mod PT Treatments $Gait Training: 8-22 mins        Rolinda Roan, PT, DPT Acute Rehabilitation Services Pager: [phone] Office: [phone]   Thelma Comp 03/04/2018, 3:00 PM

## 2018-03-04 NOTE — Care Management (Addendum)
CM received consult for medication assistance.  Pt has active medicaid per epic - at this time CM can not provide assistance for current regimen.  Discharge medications will need to be reconciled based on Medicaid preferred drug list.CM will continue to follow for needs

## 2018-03-04 NOTE — Progress Notes (Signed)
Initial Nutrition Assessment  DOCUMENTATION CODES:   Non-severe (moderate) malnutrition in context of chronic illness  INTERVENTION:   - Add Ensure Enlive TID (each provides 350 kcal and 20 g protein) - Continue folvite, MVI with minerals, and thiamine   NUTRITION DIAGNOSIS:   Moderate Malnutrition related to chronic illness, social / environmental circumstances as evidenced by estimated needs, per patient/family report, moderate fat depletion, energy intake < 75% for > or equal to 3 months.  GOAL:   Patient will meet greater than or equal to 90% of their needs  MONITOR:   PO intake, Labs, Supplement acceptance  REASON FOR ASSESSMENT:   Malnutrition Screening Tool    ASSESSMENT:   57 yo male, admitted with hematochezia, abdominal pain and vomiting. PMH significant for EtOH use, alcoholic liver disease, GERD, HTN, HLD, MI. Noncompliant with medications d/t financial insecurity.   Labs: glucose 110, BUN <5, Creatinine WNL, corrected Ca WNL, ALP 411, AST 173, ALT 52, troponin I 0.03, Hgb 9.9, Hct 30.9 Meds: folvite, MVI with minerals, thiamine  Pt enjoying lunch at time of visit (100% pot roast, mashed potatoes, green beans - asking for a second serving). Reports a good appetite today, and describes his appetite PTA as variable. States he eats well when he is hungry. Unable to ascertain normal meal pattern.  Pt does not follow any special diet and does not take MVI outside of those prescribed this hospital admission.  UBW 155-160# (70.5-72.7 kg), last weighing this much several years ago --> 16% wt loss over several years is not clinically significant.   Pt endorses nausea and vomiting, and bloody stool. Pt actively bleeding from rectum, soaking through clothes onto bed. Denies diarrhea or constipation, though last BM 11/1 PTA. Pt reports no difficulty chewing or swallowing - note, pt declined graham crackers for a snack because they are too hard to eat with missing  teeth.  Pt amenable to Ensure Enlive TID with meals in strawberry or vanilla flavors. Encouraged pt to eat protein-rich foods with every meal.  NUTRITION - FOCUSED PHYSICAL EXAM:    Most Recent Value  Orbital Region  Mild depletion  Upper Arm Region  Moderate depletion  Thoracic and Lumbar Region  Moderate depletion  Buccal Region  Moderate depletion  Temple Region  Mild depletion  Clavicle Bone Region  Mild depletion  Clavicle and Acromion Bone Region  Mild depletion  Scapular Bone Region  No depletion  Dorsal Hand  Mild depletion  Patellar Region  Unable to assess  Anterior Thigh Region  Unable to assess  Posterior Calf Region  No depletion  Edema (RD Assessment)  None  Hair  Reviewed  Eyes  Reviewed  Mouth  Reviewed  Skin  Reviewed  Nails  Other (Comment) pale      Diet Order:   100% of 1 meal documented, per nsg Diet Order            Diet Heart Room service appropriate? Yes; Fluid consistency: Thin  Diet effective now              EDUCATION NEEDS:  No education needs have been identified at this time  Skin:  Skin Assessment: Reviewed RN Assessment  Last BM:  PTA, 11/1 per chart  Height:  Ht Readings from Last 1 Encounters:  03/03/18 5\' 10"  (1.778 m)    Weight:  Wt Readings from Last 1 Encounters:  03/04/18 63.5 kg    Ideal Body Weight:  75.5 kg  BMI:  Body mass index is  20.09 kg/m.  Estimated Nutritional Needs:   Kcal:  0375-4360 kcal/day(25-30 kcal/kg IBW)  Protein:  91-113 g protein/day(1.2-1.5 g protein/day)  Fluid:  2L daily or per MD discretion  Althea Grimmer, MS, RDN, LDN On-call pager: [phone]

## 2018-03-04 NOTE — Plan of Care (Signed)
  Problem: Clinical Measurements: Goal: Diagnostic test results will improve Outcome: Progressing   Problem: Activity: Goal: Risk for activity intolerance will decrease Outcome: Progressing   Problem: Nutrition: Goal: Adequate nutrition will be maintained Outcome: Progressing   

## 2018-03-04 NOTE — Progress Notes (Signed)
   Subjective: Devin [name] reported feeling better this morning. He said he was still having abdominal pain but it was better than yesterday. Denied any n/v. Was tolerating PO intake and had one bowel movement with blood only noted on the toilet paper. He also said he had a nose bleed.   Objective:  Vital signs in last 24 hours: Vitals:   03/03/18 1800 03/03/18 1838 03/03/18 1953 03/04/18 0412  BP: (!) 157/94 (!) 143/106 (!) 148/102 (!) 133/96  Pulse: (!) 105 (!) 103 (!) 106 97  Resp:  20 20 20   Temp:  99 F (37.2 C) 98.7 F (37.1 C) 98.5 F (36.9 C)  TempSrc:  Oral Oral Oral  SpO2: 99% 100% 100% 99%  Weight:      Height:       Physical Exam  Constitutional: He is oriented to person, place, and time and well-developed, well-nourished, and in no distress.  Abdominal: He exhibits distension. There is tenderness. There is no rebound and no guarding.  Hepatomegaly and splenomegaly noted  Neurological: He is alert and oriented to person, place, and time.  Skin: Skin is warm and dry.    Assessment/Plan:  Principal Problem:   Hematochezia Active Problems:   History of alcohol use disorder   Atypical chest pain   Transaminitis   Serum total bilirubin elevated   Abdominal pain  Devin [name] is an un-housed 57 yo male with a PMHx of ETOH use disorder, alcoholic liver disease, GERD, HTN, HLD, MI presenting to the ED with chest pain, abdominal pain, vomiting and bloody stools.  He woke up this morning with acute onset of severe abdominal pain in the left lower and right lower quadrant and bilateral chest pain.   Abdominal Pain Distended and tender on exam. Continues to have bloody bowel movements, blood on toilet paper not in stool. Pain likely due to constipation. Will add lactulose TID PRN with a goal of 1-2 bowel movements a day. -follow-up a.m. CBC -Follow-up a.m. CMP -Zofran as needed for nausea -Pantoprazole 40 mg IV  Atypical Chest Pain Likely due to acid reflux and vomiting.   Repeat EKG showed NSR.  -d/c Telemetry -Troponin 0.04 x2  Anemia Hgb 9.9, stable.  Patient reported bloody stools and history of hemorrhoids.  Tachycardic on admission. -Follow-up a.m. CBC -Monitor vitals  Alcohol use disorder -CIWA protocol -Folic acid, thiamine, multivitamin with minerals  Dispo: Anticipated discharge in approximately 1 day(s).   Mike Craze, DO 03/04/2018, 7:26 AM Pager: [phone]

## 2018-03-05 ENCOUNTER — Telehealth: Payer: Self-pay | Admitting: Internal Medicine

## 2018-03-05 ENCOUNTER — Encounter (HOSPITAL_COMMUNITY): Payer: Self-pay | Admitting: General Practice

## 2018-03-05 LAB — CBC
HEMATOCRIT: 30.1 % — AB (ref 39.0–52.0)
Hemoglobin: 9.8 g/dL — ABNORMAL LOW (ref 13.0–17.0)
MCH: 32.5 pg (ref 26.0–34.0)
MCHC: 32.6 g/dL (ref 30.0–36.0)
MCV: 99.7 fL (ref 80.0–100.0)
NRBC: 0.7 % — AB (ref 0.0–0.2)
PLATELETS: 160 10*3/uL (ref 150–400)
RBC: 3.02 MIL/uL — AB (ref 4.22–5.81)
RDW: 19.6 % — ABNORMAL HIGH (ref 11.5–15.5)
WBC: 6.1 10*3/uL (ref 4.0–10.5)

## 2018-03-05 MED ORDER — INFLUENZA VAC SPLIT QUAD 0.5 ML IM SUSY: 0.5000 mL | PREFILLED_SYRINGE | INTRAMUSCULAR | Status: DC

## 2018-03-05 MED ORDER — LACTULOSE 10 GM/15ML PO SOLN
20.0000 g | Freq: Once | ORAL | Status: AC
  Administered 2018-03-05: 20 g via ORAL
  Filled 2018-03-05: qty 30

## 2018-03-05 MED ORDER — NALTREXONE HCL 50 MG PO TABS: 50.0000 mg | ORAL_TABLET | Freq: Every day | ORAL | 0 refills | Status: DC

## 2018-03-05 MED ORDER — INFLUENZA VAC SPLIT QUAD 0.5 ML IM SUSY
0.5000 mL | PREFILLED_SYRINGE | INTRAMUSCULAR | Status: AC
  Administered 2018-03-05: 0.5 mL via INTRAMUSCULAR

## 2018-03-05 MED ORDER — LACTULOSE 10 GM/15ML PO SOLN: 20.0000 g | Freq: Every day | ORAL | 0 refills | Status: AC

## 2018-03-05 MED ORDER — NALTREXONE HCL 50 MG PO TABS: 50.0000 mg | ORAL_TABLET | Freq: Every day | ORAL | 0 refills | Status: AC

## 2018-03-05 NOTE — Discharge Summary (Addendum)
Name: Devin [name] MRN: [id number] DOB: 17-Jan-1961 57 y.o. PCP: Katherine Roan, MD  Date of Admission: 03/03/2018 11:42 AM Date of Discharge: 03/05/2018 Attending Physician: Axel Filler, *  Discharge Diagnosis: 1.  Chronic abdominal pain 2. Alcohol use disorder   Discharge Medications: Allergies as of 03/05/2018   No Known Allergies     Medication List    TAKE these medications   atorvastatin 40 MG tablet Commonly known as:  LIPITOR Take 1 tablet (40 mg total) by mouth daily.   dicyclomine 20 MG tablet Commonly known as:  BENTYL Take 1 tablet (20 mg total) by mouth 2 (two) times daily for 7 days.   famotidine 20 MG tablet Commonly known as:  PEPCID Take 1 tablet (20 mg total) by mouth 2 (two) times daily.   lactulose 10 GM/15ML solution Commonly known as:  CHRONULAC Take 30 mLs (20 g total) by mouth daily.   metoprolol succinate 100 MG 24 hr tablet Commonly known as:  TOPROL-XL Take 1 tablet (100 mg total) by mouth daily. Take with or immediately following a meal.   naltrexone 50 MG tablet Commonly known as:  DEPADE Take 1 tablet (50 mg total) by mouth daily.   omeprazole 20 MG capsule Commonly known as:  PRILOSEC Take 1 capsule (20 mg total) by mouth daily.   sertraline 50 MG tablet Commonly known as:  ZOLOFT Take 1 tablet (50 mg total) by mouth daily.   sucralfate 1 GM/10ML suspension Commonly known as:  CARAFATE Take 10 mLs (1 g total) by mouth 4 (four) times daily -  with meals and at bedtime.       Disposition and follow-up:   [name] was discharged from [facility] in Stable condition.  At the hospital follow up visit please address:  1.  Chronic abdominal pain- patient was gaseous and distended during admission, prescribed lactulose once a day, please assess compliance and if symptoms have improved.  Case management informed us that this medication was not covered by medicaid, please see if patient was  able to afford this medication.  2. Anemia- Hgb was 9.5, decreased from 11.3 from 10/19; stable on admission, follow up CBC   3. Alcohol use disorder- patient expressed interest in quitting alcohol, prescribed naltrexone qd, assess compliance   4.  Labs / imaging needed at time of follow-up: cbc  5.  Pending labs/ test needing follow-up: none  Follow-up Appointments:   Hospital follow up appointment at [facility] on 11/2 at 1045 am  Hospital Course by problem list: 1. Chronic Abdominal Pain- Patient has history of chronic abdominal pain that seems to have worsened over the past week.  Distended, tender on right upper quadrant and right lower quadrant.  He stated he usually comes and goes however this time it did not go away.  He had 3 episodes of nonbloody nonbilious vomiting.  Noted bloody stool for the past week but has a history of hemorrhoids.  LFTs elevated, unchanged from prior admission.  Total bilirubin elevated at 3.6, slightly higher than previous admission. Mild gallbladder distension without gallstones. Most likely in the setting of constipation. Prescribed lactulose qd.  2. Anemia- Hgb was 9.5, decreased from 11.3 from 10/19. Remained stable during hospital course. Patient has a history of internal hemorrhoids that have been bleeding. Colonoscopy was March 2019 found to have multiple sessile polyps and internal hemorrhoids.  Upper endoscopy also in March 2019 showed duodenitis. Recommend following up outpatient.  3. Alcohol  use disorder- started on naltrexone 50 mg qd and SW was to provide community resources to help patient quit alcohol.    Discharge Vitals:   BP (!) 132/95 (BP Location: Right Arm)   Pulse (!) 103   Temp 98.2 F (36.8 C) (Oral)   Resp 18   Ht 5\' 10"  (1.778 m)   Wt 63.5 kg   SpO2 99%   BMI 20.09 kg/m   Pertinent Labs, Studies, and Procedures:   CBC Latest Ref Rng & Units 03/05/2018 03/04/2018 03/03/2018  WBC 4.0 - 10.5 K/uL 6.1 5.3 5.9  Hemoglobin 13.0  - 17.0 g/dL 9.8(L) 9.9(L) 9.5(L)  Hematocrit 39.0 - 52.0 % 30.1(L) 30.9(L) 29.2(L)  Platelets 150 - 400 K/uL 160 162 178    CMP Latest Ref Rng & Units 03/04/2018 03/03/2018 02/16/2018  Glucose 70 - 99 mg/dL 110(H) 106(H) 116(H)  BUN 6 - 20 mg/dL <5(L) 10 8  Creatinine 0.61 - 1.24 mg/dL 0.87 0.83 1.24  Sodium 135 - 145 mmol/L 136 134(L) 130(L)  Potassium 3.5 - 5.1 mmol/L 3.5 3.8 3.9  Chloride 98 - 111 mmol/L 104 105 99  CO2 22 - 32 mmol/L 24 20(L) 20(L)  Calcium 8.9 - 10.3 mg/dL 8.5(L) 8.4(L) 8.9  Total Protein 6.5 - 8.1 g/dL 7.7 7.2 7.6  Total Bilirubin 0.3 - 1.2 mg/dL 4.2(H) 3.6(H) 2.7(H)  Alkaline Phos 38 - 126 U/L 411(H) 448(H) 451(H)  AST 15 - 41 U/L 173(H) 177(H) 294(H)  ALT 0 - 44 U/L 52(H) 51(H) 80(H)   Dg Chest 2 View  Result Date: 03/03/2018 CLINICAL DATA:  Abdominal pain and vomiting. EXAM: CHEST - 2 VIEW COMPARISON:  Chest x-ray dated January 17, 2018. FINDINGS: The heart size and mediastinal contours are within normal limits. Normal pulmonary vascularity. No focal consolidation, pleural effusion, or pneumothorax. Bilateral nipple shadows again noted. No acute osseous abnormality. Old left sixth rib fracture again noted. IMPRESSION: No active cardiopulmonary disease. Electronically Signed   By: Titus Dubin M.D.   On: 03/03/2018 12:28   Ct Abdomen Pelvis W Contrast  Result Date: 03/03/2018 CLINICAL DATA:  Abdominal pain and vomiting. EXAM: CT ABDOMEN AND PELVIS WITH CONTRAST TECHNIQUE: Multidetector CT imaging of the abdomen and pelvis was performed using the standard protocol following bolus administration of intravenous contrast. CONTRAST:  152mL OMNIPAQUE IOHEXOL 300 MG/ML  SOLN COMPARISON:  CT abdomen pelvis dated January 18, 2018. FINDINGS: Lower chest: No acute abnormality. Hepatobiliary: Hepatomegaly with diffuse hepatic steatosis, unchanged. Mild gallbladder distention without gallstones, wall thickening, or biliary dilatation. Pancreas: Unremarkable. No pancreatic  ductal dilatation or surrounding inflammatory changes. Spleen: Normal in size without focal abnormality. Adrenals/Urinary Tract: Adrenal glands are unremarkable. Kidneys are normal, without renal calculi, focal lesion, or hydronephrosis. Bladder is unremarkable. Stomach/Bowel: The stomach, small bowel, and colon are unremarkable. Normal appendix. Unchanged asymmetric wall thickening of the rectum (series 3, image 82). Vascular/Lymphatic: No significant vascular findings are present. Mildly enlarged periportal and precaval lymph nodes are unchanged. Reproductive: Prostate is unremarkable. Other: No abdominal wall hernia or abnormality. No abdominopelvic ascites. No pneumoperitoneum. Musculoskeletal: No acute or significant osseous findings. IMPRESSION: 1.  No acute intra-abdominal process. 2. Unchanged asymmetric wall thickening of the rectum. Further evaluation with digital exam and/or colonoscopy is recommended to exclude malignancy. 3. Unchanged hepatomegaly and diffuse steatosis. Electronically Signed   By: Titus Dubin M.D.   On: 03/03/2018 15:08     Discharge Instructions: Discharge Instructions    Diet - low sodium heart healthy   Complete by:  As  directed    Discharge instructions   Complete by:  As directed    Devin [name],  Please start taking Lactulose once a day to help your constipation. Also, start taking Naltrexone 50 mg once a day to help decrease your alcohol cravings.   Increase activity slowly   Complete by:  As directed       Signed: Ray Gervasi N, DO 03/05/2018, 1:06 PM   Pager: [phone]

## 2018-03-05 NOTE — Progress Notes (Signed)
   Subjective: Mr. Bhardwaj reported feeling well today.  He is still continuing to have rectal bleeding due to his hemorrhoids.  He said he had one bowel movement this morning and his abdominal pain has improved.  Denied any nausea vomiting.  Was in agreement to start naltrexone to help quit alcohol.  Objective:  Vital signs in last 24 hours: Vitals:   03/04/18 1609 03/04/18 2102 03/05/18 0532 03/05/18 0945  BP: (!) 145/93 (!) 158/108 132/89 (!) 132/95  Pulse: (!) 110 (!) 108 100 (!) 103  Resp: 20 18 18 18   Temp: 99 F (37.2 C) 98.3 F (36.8 C) 98.3 F (36.8 C) 98.2 F (36.8 C)  TempSrc: Oral Oral Oral Oral  SpO2: 100% 97% 99% 99%  Weight:   63.5 kg   Height:       Physical Exam  Constitutional: He is oriented to person, place, and time and well-developed, well-nourished, and in no distress.  Abdominal: He exhibits distension. There is tenderness. There is no guarding.  Neurological: He is alert and oriented to person, place, and time.    Assessment/Plan:  Principal Problem:   Abdominal pain Active Problems:   Alcohol use disorder, moderate, dependence (HCC)   Homelessness   Alcoholic cirrhosis ([place])  Mr. Edmonds is an un-housed 57 yo male with a PMHx of ETOH use disorder, alcoholic liver disease, GERD, HTN, HLD, MI presenting to the ED with chest pain, abdominal pain, vomiting and bloody stools. He woke up this morning with acute onset of severe abdominal pain in the left lower and right lower quadrant and bilateral chest pain.  Abdominal Pain Distended and tender on exam. Continues to have bloody bowel movements. Said his last BM was this morning. Will recommend lactulose once daily with a goal of 1-2 bowel movements a day. Stable for discharge today. -Zofran as needed for nausea -Pantoprazole 40mg IV  Anemia Hgb 9.8, stable  Alcohol use disorder -CIWA protocol -Folic acid, thiamine, multivitamin with minerals  Dispo: Patient is medically stable for discharge  today. Pending social work recommendations for resources to help quit alcohol use and for housing.   Tyria Springer N, DO 03/05/2018, 1:06 PM Pager: [phone]

## 2018-03-05 NOTE — Clinical Social Work Note (Signed)
CSW met with patient earlier today regarding his living situation and alcohol treatment. Patient explained that he is currently homeless and this was discussed. Devin [name] reported that his niece was his payee and he was staying with her in [place], but she was not using his money appropriately and he went to [facility] and was made his own payee. Patient acknowledged that he has a problem with alcohol and wants detox and this was dicussed. CSW also encouraged patient to apply for public housing in [place], although there will be a long waiting list. Devin [name] was concerned about his current living situation and CSW expressed understanding and shelter recourses provided. CSW and patient also talked about the [employer] and the services they can provided, including access to phone to call various resources.  Patient reported no other needs or request.  Patient discharged earlier this evening.  Devin [name], MSW, LCSW Licensed Clinical Social Worker [place] [phone]

## 2018-03-05 NOTE — Progress Notes (Signed)
Devin [name] to be D/C'd Home per MD order.  Discussed prescriptions and follow up appointments with the patient. Prescriptions given to patient, medication list explained in detail. Pt verbalized understanding.  Allergies as of 03/05/2018   No Known Allergies     Medication List    TAKE these medications   atorvastatin 40 MG tablet Commonly known as:  LIPITOR Take 1 tablet (40 mg total) by mouth daily.   dicyclomine 20 MG tablet Commonly known as:  BENTYL Take 1 tablet (20 mg total) by mouth 2 (two) times daily for 7 days.   famotidine 20 MG tablet Commonly known as:  PEPCID Take 1 tablet (20 mg total) by mouth 2 (two) times daily.   lactulose 10 GM/15ML solution Commonly known as:  CHRONULAC Take 30 mLs (20 g total) by mouth daily.   metoprolol succinate 100 MG 24 hr tablet Commonly known as:  TOPROL-XL Take 1 tablet (100 mg total) by mouth daily. Take with or immediately following a meal.   naltrexone 50 MG tablet Commonly known as:  DEPADE Take 1 tablet (50 mg total) by mouth daily.   omeprazole 20 MG capsule Commonly known as:  PRILOSEC Take 1 capsule (20 mg total) by mouth daily.   sertraline 50 MG tablet Commonly known as:  ZOLOFT Take 1 tablet (50 mg total) by mouth daily.   sucralfate 1 GM/10ML suspension Commonly known as:  CARAFATE Take 10 mLs (1 g total) by mouth 4 (four) times daily -  with meals and at bedtime.       Vitals:   03/05/18 0945 03/05/18 1623  BP: (!) 132/95 (!) 161/99  Pulse: (!) 103 (!) 116  Resp: 18 18  Temp: 98.2 F (36.8 C) 98.1 F (36.7 C)  SpO2: 99% 100%    Skin clean, dry and intact without evidence of skin break down, no evidence of skin tears noted. IV catheter discontinued intact. Site without signs and symptoms of complications. Dressing and pressure applied. Pt denies pain at this time. No complaints noted.  An After Visit Summary was printed and given to the patient. Patient escorted via [place], and D/C home via  private auto.

## 2018-03-05 NOTE — Care Management Note (Addendum)
Case Management Note  Patient Details  Name: Devin [name] MRN: [id number] Date of Birth: 1960-12-05  Subjective/Objective: Pt admitted with abdominal pain, nausea                   Action/Plan:  PTA independent from a motel per pt.  Pt confirmed he has a PCP and active medicaid.  Pt informed CM that at discharge he will not have a place to return as he can not afford the motel until his next SSI check in December.  Pt would not elaborate as to funds still available from this months SSI check - as we are only 5 days into the month with pt having 2 midnights already this hospitalization.  CSW consulted for resources for housing.   Expected Discharge Date:                  Expected Discharge Plan:  Home/Self Care  In-House Referral:  Clinical Social Work  Discharge planning Services  CM Consult  Post Acute Care Choice:    Choice offered to:     DME Arranged:    DME Agency:     HH Arranged:    HH Agency:     Status of Service:  In process, will continue to follow  If discussed at Long Length of Stay Meetings, dates discussed:    Additional Comments:   [name], RN  Update:  CM informed attending group that lactulose is not on covered by [facility] - pt has follow up with clinic next week and they will address it then 03/05/2018, 10:27 AM

## 2018-03-05 NOTE — Telephone Encounter (Signed)
Hospital f/u per Dr Frederico Hamman; pt appt 11/12 1045am/nw

## 2018-03-06 ENCOUNTER — Other Ambulatory Visit: Payer: Self-pay

## 2018-03-06 ENCOUNTER — Emergency Department (HOSPITAL_COMMUNITY)
Admission: EM | Admit: 2018-03-06 | Discharge: 2018-03-06 | Disposition: A | Discharge status: Home / Self Care | Payer: Medicaid Other | Attending: Emergency Medicine | Admitting: Emergency Medicine

## 2018-03-06 ENCOUNTER — Encounter (HOSPITAL_COMMUNITY): Payer: Self-pay

## 2018-03-06 DIAGNOSIS — I10 Essential (primary) hypertension: Secondary | ICD-10-CM | POA: Diagnosis not present

## 2018-03-06 DIAGNOSIS — Z79899 Other long term (current) drug therapy: Secondary | ICD-10-CM | POA: Diagnosis not present

## 2018-03-06 DIAGNOSIS — F17210 Nicotine dependence, cigarettes, uncomplicated: Secondary | ICD-10-CM | POA: Insufficient documentation

## 2018-03-06 DIAGNOSIS — E785 Hyperlipidemia, unspecified: Secondary | ICD-10-CM | POA: Insufficient documentation

## 2018-03-06 DIAGNOSIS — R1010 Upper abdominal pain, unspecified: Secondary | ICD-10-CM | POA: Insufficient documentation

## 2018-03-06 LAB — COMPREHENSIVE METABOLIC PANEL
ALT: 60 U/L — ABNORMAL HIGH (ref 0–44)
AST: 179 U/L — AB (ref 15–41)
Albumin: 2.7 g/dL — ABNORMAL LOW (ref 3.5–5.0)
Alkaline Phosphatase: 382 U/L — ABNORMAL HIGH (ref 38–126)
Anion gap: 11 (ref 5–15)
BILIRUBIN TOTAL: 3.2 mg/dL — AB (ref 0.3–1.2)
BUN: 17 mg/dL (ref 6–20)
CO2: 22 mmol/L (ref 22–32)
CREATININE: 1 mg/dL (ref 0.61–1.24)
Calcium: 8.6 mg/dL — ABNORMAL LOW (ref 8.9–10.3)
Chloride: 101 mmol/L (ref 98–111)
Glucose, Bld: 95 mg/dL (ref 70–99)
POTASSIUM: 4.3 mmol/L (ref 3.5–5.1)
Sodium: 134 mmol/L — ABNORMAL LOW (ref 135–145)
TOTAL PROTEIN: 7.7 g/dL (ref 6.5–8.1)

## 2018-03-06 LAB — ETHANOL: ALCOHOL ETHYL (B): 33 mg/dL — AB (ref <10)

## 2018-03-06 LAB — URINALYSIS, ROUTINE W REFLEX MICROSCOPIC
Bilirubin Urine: NEGATIVE
Glucose, UA: NEGATIVE mg/dL
HGB URINE DIPSTICK: NEGATIVE
Ketones, ur: NEGATIVE mg/dL
LEUKOCYTES UA: NEGATIVE
NITRITE: NEGATIVE
PROTEIN: NEGATIVE mg/dL
Specific Gravity, Urine: 1.014 (ref 1.005–1.030)
pH: 5 (ref 5.0–8.0)

## 2018-03-06 LAB — RAPID URINE DRUG SCREEN, HOSP PERFORMED
Amphetamines: NOT DETECTED
BENZODIAZEPINES: NOT DETECTED
Barbiturates: NOT DETECTED
Cocaine: NOT DETECTED
OPIATES: NOT DETECTED
Tetrahydrocannabinol: NOT DETECTED

## 2018-03-06 LAB — CBC
HCT: 29.8 % — ABNORMAL LOW (ref 39.0–52.0)
HEMOGLOBIN: 9.7 g/dL — AB (ref 13.0–17.0)
MCH: 33.2 pg (ref 26.0–34.0)
MCHC: 32.6 g/dL (ref 30.0–36.0)
MCV: 102.1 fL — ABNORMAL HIGH (ref 80.0–100.0)
NRBC: 0.9 % — AB (ref 0.0–0.2)
PLATELETS: 200 10*3/uL (ref 150–400)
RBC: 2.92 MIL/uL — ABNORMAL LOW (ref 4.22–5.81)
RDW: 19.9 % — ABNORMAL HIGH (ref 11.5–15.5)
WBC: 6.7 10*3/uL (ref 4.0–10.5)

## 2018-03-06 LAB — LIPASE, BLOOD: Lipase: 82 U/L — ABNORMAL HIGH (ref 11–51)

## 2018-03-06 LAB — I-STAT TROPONIN, ED: TROPONIN I, POC: 0 ng/mL (ref 0.00–0.08)

## 2018-03-06 MED ORDER — KETOROLAC TROMETHAMINE 15 MG/ML IJ SOLN
30.0000 mg | Freq: Once | INTRAMUSCULAR | Status: AC
  Administered 2018-03-06: 30 mg via INTRAMUSCULAR
  Filled 2018-03-06: qty 2
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
## 2018-03-06 NOTE — ED Triage Notes (Addendum)
Pt BIBA. Pt complains of RUQ pain. Pt states that he is bloated as well. Pt also describes musculoskeletal pain. Pt states that he has had "1 beer this morning". Pt describes n&v this AM, but not witnessed by the RN or EMS.  Pt continues to be medication noncompliant.

## 2018-03-06 NOTE — Discharge Instructions (Addendum)
Please take the medication that was prescribed to you for your abdominal pain. Your laboratory results were unchanged from your previous visit. Please discontinue alcohol as this worsens your condition. I have provided a list of resources which you can use for outpatient treatment.

## 2018-03-07 ENCOUNTER — Other Ambulatory Visit: Payer: Self-pay

## 2018-03-07 ENCOUNTER — Emergency Department (HOSPITAL_COMMUNITY)
Admission: EM | Admit: 2018-03-07 | Discharge: 2018-03-08 | Disposition: A | Discharge status: Home / Self Care | Payer: Medicaid Other | Attending: Emergency Medicine | Admitting: Emergency Medicine

## 2018-03-07 ENCOUNTER — Encounter (HOSPITAL_COMMUNITY): Payer: Self-pay | Admitting: Emergency Medicine

## 2018-03-07 DIAGNOSIS — Z79899 Other long term (current) drug therapy: Secondary | ICD-10-CM | POA: Insufficient documentation

## 2018-03-07 DIAGNOSIS — F17210 Nicotine dependence, cigarettes, uncomplicated: Secondary | ICD-10-CM | POA: Diagnosis not present

## 2018-03-07 DIAGNOSIS — K625 Hemorrhage of anus and rectum: Secondary | ICD-10-CM | POA: Insufficient documentation

## 2018-03-07 DIAGNOSIS — Z5900 Homelessness unspecified: Secondary | ICD-10-CM

## 2018-03-07 DIAGNOSIS — K649 Unspecified hemorrhoids: Secondary | ICD-10-CM | POA: Diagnosis not present

## 2018-03-07 DIAGNOSIS — I10 Essential (primary) hypertension: Secondary | ICD-10-CM | POA: Insufficient documentation

## 2018-03-07 DIAGNOSIS — R109 Unspecified abdominal pain: Secondary | ICD-10-CM | POA: Diagnosis present

## 2018-03-07 LAB — CBC WITH DIFFERENTIAL/PLATELET
Abs Immature Granulocytes: 0.13 10*3/uL — ABNORMAL HIGH (ref 0.00–0.07)
Basophils Absolute: 0 10*3/uL (ref 0.0–0.1)
Basophils Relative: 1 %
Eosinophils Absolute: 0.1 10*3/uL (ref 0.0–0.5)
Eosinophils Relative: 1 %
HCT: 29.4 % — ABNORMAL LOW (ref 39.0–52.0)
HEMOGLOBIN: 9.5 g/dL — AB (ref 13.0–17.0)
Immature Granulocytes: 2 %
LYMPHS PCT: 19 %
Lymphs Abs: 1.5 10*3/uL (ref 0.7–4.0)
MCH: 33.2 pg (ref 26.0–34.0)
MCHC: 32.3 g/dL (ref 30.0–36.0)
MCV: 102.8 fL — ABNORMAL HIGH (ref 80.0–100.0)
MONO ABS: 1 10*3/uL (ref 0.1–1.0)
Monocytes Relative: 14 %
Neutro Abs: 4.8 10*3/uL (ref 1.7–7.7)
Neutrophils Relative %: 63 %
Platelets: 158 10*3/uL (ref 150–400)
RBC: 2.86 MIL/uL — AB (ref 4.22–5.81)
RDW: 19.9 % — ABNORMAL HIGH (ref 11.5–15.5)
WBC: 7.5 10*3/uL (ref 4.0–10.5)
nRBC: 0.3 % — ABNORMAL HIGH (ref 0.0–0.2)

## 2018-03-07 LAB — COMPREHENSIVE METABOLIC PANEL
ALT: 58 U/L — AB (ref 0–44)
ANION GAP: 13 (ref 5–15)
AST: 160 U/L — ABNORMAL HIGH (ref 15–41)
Albumin: 2.7 g/dL — ABNORMAL LOW (ref 3.5–5.0)
Alkaline Phosphatase: 353 U/L — ABNORMAL HIGH (ref 38–126)
BUN: 12 mg/dL (ref 6–20)
CO2: 20 mmol/L — AB (ref 22–32)
Calcium: 8.8 mg/dL — ABNORMAL LOW (ref 8.9–10.3)
Chloride: 101 mmol/L (ref 98–111)
Creatinine, Ser: 0.76 mg/dL (ref 0.61–1.24)
GFR calc non Af Amer: >60 mL/min (ref >60)
Glucose, Bld: 88 mg/dL (ref 70–99)
POTASSIUM: 3.5 mmol/L (ref 3.5–5.1)
SODIUM: 134 mmol/L — AB (ref 135–145)
Total Bilirubin: 3.1 mg/dL — ABNORMAL HIGH (ref 0.3–1.2)
Total Protein: 7.8 g/dL (ref 6.5–8.1)

## 2018-03-07 LAB — SAMPLE TO BLOOD BANK

## 2018-03-07 LAB — ETHANOL: ALCOHOL ETHYL (B): 311 mg/dL — AB (ref <10)

## 2018-03-07 LAB — PROTIME-INR
INR: 1.34
PROTHROMBIN TIME: 16.4 s — AB (ref 11.4–15.2)

## 2018-03-07 LAB — LIPASE, BLOOD: Lipase: 60 U/L — ABNORMAL HIGH (ref 11–51)

## 2018-03-07 LAB — POC OCCULT BLOOD, ED: Fecal Occult Bld: POSITIVE — AB

## 2018-03-07 MED ORDER — SODIUM CHLORIDE 0.9 % IV BOLUS: 500.0000 mL | Freq: Once | INTRAVENOUS | Status: DC

## 2018-03-07 MED ORDER — HYDROCORTISONE ACETATE 25 MG RE SUPP: 25.0000 mg | Freq: Two times a day (BID) | RECTAL | 0 refills | Status: DC

## 2018-03-07 NOTE — ED Notes (Signed)
Melissa, NT requested from [place], [place], a regular diet tray for this patient.  NS ordered food tray for patient, spoke with [facility] and was told "we'll take care of it." Patient did not receive his food order.

## 2018-03-07 NOTE — ED Triage Notes (Signed)
PT arrives via EMS. He was at [facility] trying to fill prescriptions and "wandering around" property. PT admits to drinking today. Speech is slurred. PT was seen at [facility] yesterday for abdominal pain. PT has new rectal bleeding

## 2018-03-07 NOTE — ED Provider Notes (Signed)
Patient accepted at signout from Dr. Eulis Foster.  Plan to observe the patient until adequately sober for discharge. Physical Exam  BP (!) 131/97   Pulse (!) 106   Temp 97.9 F (36.6 C) (Oral)   Resp 20   SpO2 97%   Physical Exam  ED Course/Procedures   Clinical Course as of Mar 07 2224  Thu Mar 07, 2018  1627 Abnormal, blood present  POC occult blood, ED Provider will collect(!) [EW]  1627 Normal except sodium low, CO2 low, calcium low, albumin low, AST high, ALT high, alk phos is high, total bilirubin high  Comprehensive metabolic panel(!) [EW]  4883 Trending CMET, unchanged   [EW]  1628 High  Ethanol(!!) [EW]  1628 Normal except hemoglobin low, MCV high  CBC with Differential(!) [EW]  1629 Trending of CBC, no change.   [EW]  1629 Mild elevation  Lipase, blood(!) [EW]  1630 Mild elevation, unchanged from prior  INR: 1.34 [EW]    Clinical Course User Index [EW] Daleen Bo, MD    Procedures  MDM  22: 25 patient is now alert and interactive.  He shows no signs of distress.  He is not somnolent or confused.  Rectal bleeding identified by Dr. Eulis Foster is hemorrhoidal.  Will provide Anusol suppositories.  Follow-up instructions provided.      Charlesetta Shanks, MD 03/07/18 2227
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
## 2018-03-07 NOTE — ED Notes (Signed)
Got patient undress on the monitor did ekg shown to er doctor patient is resting with call bell in reach 

## 2018-03-12 ENCOUNTER — Encounter: Payer: Self-pay | Admitting: Internal Medicine

## 2018-03-12 ENCOUNTER — Ambulatory Visit: Payer: Self-pay

## 2018-03-12 NOTE — Telephone Encounter (Signed)
Patient no showed HFU appt today. Hubbard Hartshorn, RN, BSN

## 2018-04-06 ENCOUNTER — Emergency Department (HOSPITAL_COMMUNITY)
Admission: EM | Admit: 2018-04-06 | Discharge: 2018-04-07 | Disposition: A | Discharge status: Home / Self Care | Payer: Medicaid Other | Attending: Emergency Medicine | Admitting: Emergency Medicine

## 2018-04-06 ENCOUNTER — Other Ambulatory Visit: Payer: Self-pay

## 2018-04-06 ENCOUNTER — Encounter (HOSPITAL_COMMUNITY): Payer: Self-pay | Admitting: Emergency Medicine

## 2018-04-06 DIAGNOSIS — I10 Essential (primary) hypertension: Secondary | ICD-10-CM | POA: Insufficient documentation

## 2018-04-06 DIAGNOSIS — F17210 Nicotine dependence, cigarettes, uncomplicated: Secondary | ICD-10-CM | POA: Insufficient documentation

## 2018-04-06 DIAGNOSIS — N50819 Testicular pain, unspecified: Secondary | ICD-10-CM

## 2018-04-06 DIAGNOSIS — N50811 Right testicular pain: Secondary | ICD-10-CM | POA: Diagnosis present

## 2018-04-06 DIAGNOSIS — Z79899 Other long term (current) drug therapy: Secondary | ICD-10-CM | POA: Diagnosis not present
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
## 2018-04-07 LAB — URINALYSIS, ROUTINE W REFLEX MICROSCOPIC
BILIRUBIN URINE: NEGATIVE
GLUCOSE, UA: NEGATIVE mg/dL
HGB URINE DIPSTICK: NEGATIVE
Ketones, ur: NEGATIVE mg/dL
Leukocytes, UA: NEGATIVE
Nitrite: NEGATIVE
Protein, ur: NEGATIVE mg/dL
Specific Gravity, Urine: 1.018 (ref 1.005–1.030)
pH: 5 (ref 5.0–8.0)

## 2018-04-07 MED ORDER — CEFTRIAXONE SODIUM 250 MG IJ SOLR
250.0000 mg | Freq: Once | INTRAMUSCULAR | Status: AC
  Administered 2018-04-07: 250 mg via INTRAMUSCULAR
  Filled 2018-04-07: qty 250

## 2018-04-07 MED ORDER — ACETAMINOPHEN 500 MG PO TABS: 1000.0000 mg | ORAL_TABLET | Freq: Three times a day (TID) | ORAL | 0 refills | Status: AC

## 2018-04-07 MED ORDER — STERILE WATER FOR INJECTION IJ SOLN
INTRAMUSCULAR | Status: AC
  Administered 2018-04-07: 10 mL
  Filled 2018-04-07: qty 10

## 2018-04-07 MED ORDER — LEVOFLOXACIN 500 MG PO TABS: 500.0000 mg | ORAL_TABLET | Freq: Every day | ORAL | 0 refills | Status: AC

## 2018-04-07 MED ORDER — LEVOFLOXACIN 500 MG PO TABS
500.0000 mg | ORAL_TABLET | Freq: Once | ORAL | Status: AC
  Administered 2018-04-07: 500 mg via ORAL
  Filled 2018-04-07: qty 1

## 2018-04-07 MED ORDER — KETOROLAC TROMETHAMINE 60 MG/2ML IM SOLN
30.0000 mg | Freq: Once | INTRAMUSCULAR | Status: AC
  Administered 2018-04-07: 30 mg via INTRAMUSCULAR
  Filled 2018-04-07: qty 2
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
## 2018-04-07 NOTE — ED Notes (Addendum)
Urine and culture sent to lab  

## 2018-04-07 NOTE — ED Notes (Signed)
Pt verbalized discharge instructions and follow up care.   

## 2018-05-09 ENCOUNTER — Encounter (HOSPITAL_COMMUNITY): Payer: Self-pay

## 2018-05-09 ENCOUNTER — Emergency Department (HOSPITAL_COMMUNITY)
Admission: EM | Admit: 2018-05-09 | Discharge: 2018-05-10 | Disposition: A | Discharge status: Home / Self Care | Payer: Medicaid Other | Attending: Emergency Medicine | Admitting: Emergency Medicine

## 2018-05-09 DIAGNOSIS — I10 Essential (primary) hypertension: Secondary | ICD-10-CM | POA: Insufficient documentation

## 2018-05-09 DIAGNOSIS — N50819 Testicular pain, unspecified: Secondary | ICD-10-CM

## 2018-05-09 DIAGNOSIS — Z79899 Other long term (current) drug therapy: Secondary | ICD-10-CM | POA: Insufficient documentation

## 2018-05-09 DIAGNOSIS — F17210 Nicotine dependence, cigarettes, uncomplicated: Secondary | ICD-10-CM | POA: Insufficient documentation

## 2018-05-09 DIAGNOSIS — N50811 Right testicular pain: Secondary | ICD-10-CM | POA: Insufficient documentation

## 2018-05-09 LAB — CBC
HEMATOCRIT: 36.2 % — AB (ref 39.0–52.0)
HEMOGLOBIN: 11.9 g/dL — AB (ref 13.0–17.0)
MCH: 31.5 pg (ref 26.0–34.0)
MCHC: 32.9 g/dL (ref 30.0–36.0)
MCV: 95.8 fL (ref 80.0–100.0)
Platelets: 321 10*3/uL (ref 150–400)
RBC: 3.78 MIL/uL — AB (ref 4.22–5.81)
RDW: 14.6 % (ref 11.5–15.5)
WBC: 6.3 10*3/uL (ref 4.0–10.5)
nRBC: 0 % (ref 0.0–0.2)

## 2018-05-09 LAB — COMPREHENSIVE METABOLIC PANEL
ALBUMIN: 3.7 g/dL (ref 3.5–5.0)
ALT: 40 U/L (ref 0–44)
ANION GAP: 13 (ref 5–15)
AST: 64 U/L — AB (ref 15–41)
Alkaline Phosphatase: 214 U/L — ABNORMAL HIGH (ref 38–126)
BUN: 10 mg/dL (ref 6–20)
CHLORIDE: 101 mmol/L (ref 98–111)
CO2: 24 mmol/L (ref 22–32)
Calcium: 9.1 mg/dL (ref 8.9–10.3)
Creatinine, Ser: 0.82 mg/dL (ref 0.61–1.24)
GFR calc Af Amer: >60 mL/min (ref >60)
GFR calc non Af Amer: >60 mL/min (ref >60)
GLUCOSE: 90 mg/dL (ref 70–99)
POTASSIUM: 3.3 mmol/L — AB (ref 3.5–5.1)
SODIUM: 138 mmol/L (ref 135–145)
Total Bilirubin: 0.9 mg/dL (ref 0.3–1.2)
Total Protein: 8 g/dL (ref 6.5–8.1)

## 2018-05-09 LAB — LIPASE, BLOOD: Lipase: 68 U/L — ABNORMAL HIGH (ref 11–51)

## 2018-05-09 NOTE — ED Triage Notes (Signed)
Pt here with ems for generalized abd pain today, nausea no vomiting. +ETOH today.

## 2018-05-10 ENCOUNTER — Emergency Department (HOSPITAL_COMMUNITY): Payer: Medicaid Other

## 2018-05-10 LAB — URINALYSIS, ROUTINE W REFLEX MICROSCOPIC
BILIRUBIN URINE: NEGATIVE
Glucose, UA: NEGATIVE mg/dL
Hgb urine dipstick: NEGATIVE
KETONES UR: NEGATIVE mg/dL
Leukocytes, UA: NEGATIVE
NITRITE: NEGATIVE
PROTEIN: NEGATIVE mg/dL
Specific Gravity, Urine: 1.017 (ref 1.005–1.030)
pH: 5 (ref 5.0–8.0)

## 2018-05-10 LAB — GC/CHLAMYDIA PROBE AMP (~~LOC~~) NOT AT ARMC
CHLAMYDIA, DNA PROBE: NEGATIVE
NEISSERIA GONORRHEA: NEGATIVE

## 2018-05-10 MED ORDER — FENTANYL CITRATE (PF) 100 MCG/2ML IJ SOLN
50.0000 ug | Freq: Once | INTRAMUSCULAR | Status: AC
  Administered 2018-05-10: 50 ug via INTRAVENOUS
  Filled 2018-05-10: qty 2

## 2018-05-10 MED ORDER — SODIUM CHLORIDE 0.9 % IV BOLUS
1000.0000 mL | Freq: Once | INTRAVENOUS | Status: AC
  Administered 2018-05-10: 1000 mL via INTRAVENOUS

## 2018-05-10 MED ORDER — ONDANSETRON HCL 4 MG/2ML IJ SOLN
4.0000 mg | INTRAMUSCULAR | Status: AC
  Administered 2018-05-10: 4 mg via INTRAVENOUS
  Filled 2018-05-10: qty 2

## 2018-05-10 NOTE — ED Notes (Addendum)
Pt discharged from ED; instructions provided; Pt encouraged to return to ED if symptoms worsen and to f/u with PCP; Pt verbalized understanding of all instructions 

## 2018-05-10 NOTE — Discharge Instructions (Addendum)
Return for any new or worsening symptoms.
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
## 2018-05-10 NOTE — ED Notes (Signed)
Pt ambulated safely without assistance or need for assistive device.

## 2018-05-15 ENCOUNTER — Emergency Department (HOSPITAL_COMMUNITY)
Admission: EM | Admit: 2018-05-15 | Discharge: 2018-05-16 | Disposition: A | Discharge status: Home / Self Care | Payer: Medicaid Other | Attending: Emergency Medicine | Admitting: Emergency Medicine

## 2018-05-15 ENCOUNTER — Encounter (HOSPITAL_COMMUNITY): Payer: Self-pay | Admitting: Emergency Medicine

## 2018-05-15 ENCOUNTER — Other Ambulatory Visit: Payer: Self-pay

## 2018-05-15 DIAGNOSIS — F1210 Cannabis abuse, uncomplicated: Secondary | ICD-10-CM | POA: Insufficient documentation

## 2018-05-15 DIAGNOSIS — I10 Essential (primary) hypertension: Secondary | ICD-10-CM | POA: Diagnosis not present

## 2018-05-15 DIAGNOSIS — Z79899 Other long term (current) drug therapy: Secondary | ICD-10-CM | POA: Insufficient documentation

## 2018-05-15 DIAGNOSIS — I252 Old myocardial infarction: Secondary | ICD-10-CM | POA: Insufficient documentation

## 2018-05-15 DIAGNOSIS — F17210 Nicotine dependence, cigarettes, uncomplicated: Secondary | ICD-10-CM | POA: Diagnosis not present

## 2018-05-15 DIAGNOSIS — R1031 Right lower quadrant pain: Secondary | ICD-10-CM | POA: Diagnosis not present

## 2018-05-15 DIAGNOSIS — N50811 Right testicular pain: Secondary | ICD-10-CM | POA: Diagnosis present

## 2018-05-15 LAB — URINALYSIS, ROUTINE W REFLEX MICROSCOPIC
Bilirubin Urine: NEGATIVE
Glucose, UA: NEGATIVE mg/dL
Hgb urine dipstick: NEGATIVE
Ketones, ur: NEGATIVE mg/dL
LEUKOCYTES UA: NEGATIVE
NITRITE: NEGATIVE
PH: 6 (ref 5.0–8.0)
PROTEIN: NEGATIVE mg/dL
Specific Gravity, Urine: 1.002 — ABNORMAL LOW (ref 1.005–1.030)

## 2018-05-15 NOTE — ED Triage Notes (Signed)
Pt BIB [facility], c/o groin pain and pain with urination that started 20 minutes PTA. Denies injury/trauma

## 2018-05-16 ENCOUNTER — Ambulatory Visit (HOSPITAL_COMMUNITY)
Admission: RE | Admit: 2018-05-16 | Discharge: 2018-05-16 | Disposition: A | Discharge status: Home / Self Care | Payer: Medicaid Other | Source: Ambulatory Visit | Attending: Internal Medicine | Admitting: Internal Medicine

## 2018-05-16 ENCOUNTER — Ambulatory Visit (INDEPENDENT_AMBULATORY_CARE_PROVIDER_SITE_OTHER): Payer: Medicaid Other | Admitting: Internal Medicine

## 2018-05-16 ENCOUNTER — Other Ambulatory Visit: Payer: Self-pay

## 2018-05-16 VITALS — BP 159/102 | HR 110 | Temp 97.8°F | Ht 70.0 in | Wt 164.0 lb

## 2018-05-16 DIAGNOSIS — Z79899 Other long term (current) drug therapy: Secondary | ICD-10-CM

## 2018-05-16 DIAGNOSIS — N50811 Right testicular pain: Secondary | ICD-10-CM | POA: Insufficient documentation

## 2018-05-16 DIAGNOSIS — I43 Cardiomyopathy in diseases classified elsewhere: Secondary | ICD-10-CM

## 2018-05-16 DIAGNOSIS — R110 Nausea: Secondary | ICD-10-CM

## 2018-05-16 DIAGNOSIS — Z7289 Other problems related to lifestyle: Secondary | ICD-10-CM | POA: Diagnosis not present

## 2018-05-16 DIAGNOSIS — K2980 Duodenitis without bleeding: Secondary | ICD-10-CM | POA: Diagnosis not present

## 2018-05-16 DIAGNOSIS — K7469 Other cirrhosis of liver: Secondary | ICD-10-CM | POA: Diagnosis not present

## 2018-05-16 DIAGNOSIS — I10 Essential (primary) hypertension: Secondary | ICD-10-CM

## 2018-05-16 DIAGNOSIS — I119 Hypertensive heart disease without heart failure: Secondary | ICD-10-CM

## 2018-05-16 MED ORDER — PROMETHAZINE HCL 25 MG/ML IJ SOLN
12.5000 mg | Freq: Once | INTRAMUSCULAR | Status: AC
  Administered 2018-05-16: 12.5 mg via INTRAMUSCULAR

## 2018-05-16 MED ORDER — PANTOPRAZOLE SODIUM 40 MG PO TBEC: 40.0000 mg | DELAYED_RELEASE_TABLET | Freq: Every day | ORAL | 5 refills | Status: DC

## 2018-05-16 MED ORDER — METOPROLOL SUCCINATE ER 100 MG PO TB24: 100.0000 mg | ORAL_TABLET | Freq: Every day | ORAL | 0 refills | Status: DC

## 2018-05-16 MED ORDER — TRAMADOL HCL 50 MG PO TABS: 50.0000 mg | ORAL_TABLET | Freq: Two times a day (BID) | ORAL | 0 refills | Status: DC | PRN
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
## 2018-05-16 NOTE — Assessment & Plan Note (Signed)
Refilled metoprolol 100mg  daily for HTN and HTNsive cardiomyopathy

## 2018-05-16 NOTE — Patient Instructions (Signed)
We will get imaging of your abdomen to assess your pain.  For pain you can take tramadol, one tablet every 12 hours.   I have refilled your pantoprazole and your blood pressure medicines.

## 2018-05-16 NOTE — Assessment & Plan Note (Addendum)
Patient with right testicular and inguinal pain that is episodic in nature. Exam reveals mild focal distention with TTP at each possible hernia sites and superior right testicle. Patient has associated nausea w/o vomiting, fever.  Previously evaluated for testicular torsion in ED 6d ago with u/s negative for any acute testicular process. His initial presentation with this to the ED, he was treated for epididymitis with levofloxacin which patient states he completed.   Plan: --obtain stat CT pel to assess for hernia and esp strangulation --administered IM phenergan in the clinic  Addendum: CT w/o hernias, lymphadenopathy or any other abnormality. Will refer to urology since pain is primary testicular.

## 2018-05-17 NOTE — Progress Notes (Signed)
Internal Medicine Clinic Attending  I saw and evaluated the patient.  I personally confirmed the key portions of the history and exam documented by Dr. Svalina and I reviewed pertinent patient test results.  The assessment, diagnosis, and plan were formulated together and I agree with the documentation in the resident's note.  

## 2018-05-21 ENCOUNTER — Emergency Department (HOSPITAL_COMMUNITY)
Admission: EM | Admit: 2018-05-21 | Discharge: 2018-05-22 | Disposition: A | Discharge status: Home / Self Care | Payer: Medicaid Other | Attending: Emergency Medicine | Admitting: Emergency Medicine

## 2018-05-21 DIAGNOSIS — F17210 Nicotine dependence, cigarettes, uncomplicated: Secondary | ICD-10-CM | POA: Diagnosis not present

## 2018-05-21 DIAGNOSIS — N50819 Testicular pain, unspecified: Secondary | ICD-10-CM

## 2018-05-21 DIAGNOSIS — N50811 Right testicular pain: Secondary | ICD-10-CM | POA: Insufficient documentation

## 2018-05-21 DIAGNOSIS — I10 Essential (primary) hypertension: Secondary | ICD-10-CM | POA: Diagnosis not present

## 2018-05-22 ENCOUNTER — Emergency Department (HOSPITAL_COMMUNITY): Payer: Medicaid Other
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
## 2018-05-22 NOTE — ED Notes (Signed)
Patient c/o RIGHT testicular pain. States "it feels like someone kicked and it hurts really bad".

## 2018-05-22 NOTE — ED Notes (Signed)
Patient transported to US 

## 2018-05-22 NOTE — ED Notes (Signed)
Patient verbalizes understanding of medications and discharge instructions. No further questions at this time. VSS and patient ambulatory at discharge.    Patient given [place] sandwich and PO fluids.
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
## 2018-05-27 ENCOUNTER — Encounter (HOSPITAL_COMMUNITY): Payer: Self-pay

## 2018-05-27 ENCOUNTER — Emergency Department (HOSPITAL_COMMUNITY)
Admission: EM | Admit: 2018-05-27 | Discharge: 2018-05-28 | Disposition: A | Discharge status: Home / Self Care | Payer: Medicaid Other | Attending: Emergency Medicine | Admitting: Emergency Medicine

## 2018-05-27 ENCOUNTER — Other Ambulatory Visit: Payer: Self-pay

## 2018-05-27 DIAGNOSIS — Z79899 Other long term (current) drug therapy: Secondary | ICD-10-CM | POA: Diagnosis not present

## 2018-05-27 DIAGNOSIS — R1031 Right lower quadrant pain: Secondary | ICD-10-CM | POA: Diagnosis present

## 2018-05-27 DIAGNOSIS — G8929 Other chronic pain: Secondary | ICD-10-CM | POA: Insufficient documentation

## 2018-05-27 DIAGNOSIS — I10 Essential (primary) hypertension: Secondary | ICD-10-CM | POA: Insufficient documentation

## 2018-05-27 DIAGNOSIS — E876 Hypokalemia: Secondary | ICD-10-CM | POA: Diagnosis not present

## 2018-05-27 DIAGNOSIS — R109 Unspecified abdominal pain: Secondary | ICD-10-CM | POA: Insufficient documentation

## 2018-05-27 DIAGNOSIS — I252 Old myocardial infarction: Secondary | ICD-10-CM | POA: Diagnosis not present

## 2018-05-27 DIAGNOSIS — R1110 Vomiting, unspecified: Secondary | ICD-10-CM | POA: Diagnosis not present

## 2018-05-27 DIAGNOSIS — F17210 Nicotine dependence, cigarettes, uncomplicated: Secondary | ICD-10-CM | POA: Diagnosis not present

## 2018-05-27 LAB — LIPASE, BLOOD: Lipase: 40 U/L (ref 11–51)

## 2018-05-27 LAB — CBC
HCT: 33.1 % — ABNORMAL LOW (ref 39.0–52.0)
Hemoglobin: 10.7 g/dL — ABNORMAL LOW (ref 13.0–17.0)
MCH: 30.2 pg (ref 26.0–34.0)
MCHC: 32.3 g/dL (ref 30.0–36.0)
MCV: 93.5 fL (ref 80.0–100.0)
Platelets: 194 10*3/uL (ref 150–400)
RBC: 3.54 MIL/uL — AB (ref 4.22–5.81)
RDW: 15.9 % — ABNORMAL HIGH (ref 11.5–15.5)
WBC: 4 10*3/uL (ref 4.0–10.5)
nRBC: 0 % (ref 0.0–0.2)

## 2018-05-27 LAB — URINALYSIS, ROUTINE W REFLEX MICROSCOPIC
Bilirubin Urine: NEGATIVE
Glucose, UA: NEGATIVE mg/dL
Hgb urine dipstick: NEGATIVE
Ketones, ur: NEGATIVE mg/dL
Leukocytes, UA: NEGATIVE
Nitrite: NEGATIVE
Protein, ur: NEGATIVE mg/dL
Specific Gravity, Urine: 1.008 (ref 1.005–1.030)
pH: 6 (ref 5.0–8.0)

## 2018-05-27 LAB — COMPREHENSIVE METABOLIC PANEL
ALT: 41 U/L (ref 0–44)
ANION GAP: 12 (ref 5–15)
AST: 101 U/L — ABNORMAL HIGH (ref 15–41)
Albumin: 3.1 g/dL — ABNORMAL LOW (ref 3.5–5.0)
Alkaline Phosphatase: 209 U/L — ABNORMAL HIGH (ref 38–126)
BUN: 7 mg/dL (ref 6–20)
CO2: 24 mmol/L (ref 22–32)
Calcium: 8.6 mg/dL — ABNORMAL LOW (ref 8.9–10.3)
Chloride: 105 mmol/L (ref 98–111)
Creatinine, Ser: 1.12 mg/dL (ref 0.61–1.24)
GFR calc Af Amer: >60 mL/min (ref >60)
GFR calc non Af Amer: >60 mL/min (ref >60)
Glucose, Bld: 99 mg/dL (ref 70–99)
Potassium: 2.9 mmol/L — ABNORMAL LOW (ref 3.5–5.1)
Sodium: 141 mmol/L (ref 135–145)
TOTAL PROTEIN: 7.2 g/dL (ref 6.5–8.1)
Total Bilirubin: 0.7 mg/dL (ref 0.3–1.2)

## 2018-05-27 MED ORDER — SODIUM CHLORIDE 0.9% FLUSH: 3.0000 mL | Freq: Once | INTRAVENOUS | Status: DC

## 2018-05-27 NOTE — ED Triage Notes (Signed)
Pt here for abd pain. Hx of the same. No trauma to the abdomen, does state he had ETOH prior to the pain.

## 2018-05-28 MED ORDER — ACETAMINOPHEN 500 MG PO TABS
1000.0000 mg | ORAL_TABLET | Freq: Once | ORAL | Status: AC
  Administered 2018-05-28: 1000 mg via ORAL
  Filled 2018-05-28: qty 2

## 2018-05-28 MED ORDER — POTASSIUM CHLORIDE CRYS ER 20 MEQ PO TBCR: 20.0000 meq | EXTENDED_RELEASE_TABLET | Freq: Two times a day (BID) | ORAL | 0 refills | Status: DC

## 2018-05-28 MED ORDER — POTASSIUM CHLORIDE CRYS ER 20 MEQ PO TBCR
40.0000 meq | EXTENDED_RELEASE_TABLET | Freq: Once | ORAL | Status: AC
  Administered 2018-05-28: 40 meq via ORAL
  Filled 2018-05-28: qty 2

## 2018-05-28 NOTE — ED Notes (Signed)
Patient verbalizes understanding of discharge instructions. Opportunity for questioning and answers were provided. Armband removed by staff, pt discharged from ED.  

## 2018-05-28 NOTE — Discharge Instructions (Signed)
You may alternate Tylenol 1000 mg every 6 hours as needed for pain and Ibuprofen 800 mg every 8 hours as needed for pain.  Please take Ibuprofen with food. ° °
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
## 2018-05-30 ENCOUNTER — Ambulatory Visit: Payer: Self-pay

## 2018-05-30 ENCOUNTER — Encounter: Payer: Self-pay | Admitting: Internal Medicine

## 2018-06-03 ENCOUNTER — Encounter: Payer: Self-pay | Admitting: *Deleted

## 2018-06-06 ENCOUNTER — Other Ambulatory Visit: Payer: Self-pay

## 2018-06-06 ENCOUNTER — Emergency Department (HOSPITAL_COMMUNITY): Payer: Medicaid Other

## 2018-06-06 ENCOUNTER — Emergency Department (HOSPITAL_COMMUNITY)
Admission: EM | Admit: 2018-06-06 | Discharge: 2018-06-06 | Disposition: A | Discharge status: Home / Self Care | Payer: Medicaid Other | Attending: Emergency Medicine | Admitting: Emergency Medicine

## 2018-06-06 ENCOUNTER — Encounter (HOSPITAL_COMMUNITY): Payer: Self-pay | Admitting: Emergency Medicine

## 2018-06-06 DIAGNOSIS — K1370 Unspecified lesions of oral mucosa: Secondary | ICD-10-CM | POA: Insufficient documentation

## 2018-06-06 DIAGNOSIS — K2100 Gastro-esophageal reflux disease with esophagitis, without bleeding: Secondary | ICD-10-CM

## 2018-06-06 DIAGNOSIS — Z590 Homelessness: Secondary | ICD-10-CM | POA: Insufficient documentation

## 2018-06-06 DIAGNOSIS — I10 Essential (primary) hypertension: Secondary | ICD-10-CM | POA: Diagnosis not present

## 2018-06-06 DIAGNOSIS — K0889 Other specified disorders of teeth and supporting structures: Secondary | ICD-10-CM | POA: Insufficient documentation

## 2018-06-06 DIAGNOSIS — Z9114 Patient's other noncompliance with medication regimen: Secondary | ICD-10-CM

## 2018-06-06 DIAGNOSIS — Z79899 Other long term (current) drug therapy: Secondary | ICD-10-CM | POA: Insufficient documentation

## 2018-06-06 DIAGNOSIS — F17210 Nicotine dependence, cigarettes, uncomplicated: Secondary | ICD-10-CM | POA: Insufficient documentation

## 2018-06-06 DIAGNOSIS — Z9119 Patient's noncompliance with other medical treatment and regimen: Secondary | ICD-10-CM | POA: Insufficient documentation

## 2018-06-06 DIAGNOSIS — F1020 Alcohol dependence, uncomplicated: Secondary | ICD-10-CM | POA: Diagnosis not present

## 2018-06-06 DIAGNOSIS — R109 Unspecified abdominal pain: Secondary | ICD-10-CM | POA: Diagnosis present

## 2018-06-06 DIAGNOSIS — K1379 Other lesions of oral mucosa: Secondary | ICD-10-CM

## 2018-06-06 DIAGNOSIS — R1084 Generalized abdominal pain: Secondary | ICD-10-CM | POA: Diagnosis not present

## 2018-06-06 LAB — URINALYSIS, ROUTINE W REFLEX MICROSCOPIC
BACTERIA UA: NONE SEEN
Bilirubin Urine: NEGATIVE
Glucose, UA: NEGATIVE mg/dL
Ketones, ur: NEGATIVE mg/dL
Leukocytes, UA: NEGATIVE
Nitrite: NEGATIVE
Protein, ur: NEGATIVE mg/dL
Specific Gravity, Urine: 1.005 (ref 1.005–1.030)
pH: 6 (ref 5.0–8.0)

## 2018-06-06 LAB — CBC
HCT: 35.9 % — ABNORMAL LOW (ref 39.0–52.0)
Hemoglobin: 11.4 g/dL — ABNORMAL LOW (ref 13.0–17.0)
MCH: 29.9 pg (ref 26.0–34.0)
MCHC: 31.8 g/dL (ref 30.0–36.0)
MCV: 94.2 fL (ref 80.0–100.0)
Platelets: 218 10*3/uL (ref 150–400)
RBC: 3.81 MIL/uL — ABNORMAL LOW (ref 4.22–5.81)
RDW: 18.1 % — ABNORMAL HIGH (ref 11.5–15.5)
WBC: 4.8 10*3/uL (ref 4.0–10.5)
nRBC: 0 % (ref 0.0–0.2)

## 2018-06-06 LAB — COMPREHENSIVE METABOLIC PANEL
ALT: 57 U/L — ABNORMAL HIGH (ref 0–44)
AST: 122 U/L — AB (ref 15–41)
Albumin: 3.9 g/dL (ref 3.5–5.0)
Alkaline Phosphatase: 303 U/L — ABNORMAL HIGH (ref 38–126)
Anion gap: 13 (ref 5–15)
BUN: 12 mg/dL (ref 6–20)
CO2: 23 mmol/L (ref 22–32)
Calcium: 8.8 mg/dL — ABNORMAL LOW (ref 8.9–10.3)
Chloride: 100 mmol/L (ref 98–111)
Creatinine, Ser: 0.71 mg/dL (ref 0.61–1.24)
GFR calc Af Amer: >60 mL/min (ref >60)
GFR calc non Af Amer: >60 mL/min (ref >60)
Glucose, Bld: 73 mg/dL (ref 70–99)
Potassium: 3.8 mmol/L (ref 3.5–5.1)
Sodium: 136 mmol/L (ref 135–145)
Total Bilirubin: 1.3 mg/dL — ABNORMAL HIGH (ref 0.3–1.2)
Total Protein: 8.4 g/dL — ABNORMAL HIGH (ref 6.5–8.1)

## 2018-06-06 LAB — LIPASE, BLOOD: Lipase: 39 U/L (ref 11–51)

## 2018-06-06 LAB — I-STAT TROPONIN, ED: Troponin i, poc: 0.01 ng/mL (ref 0.00–0.08)

## 2018-06-06 MED ORDER — SUCRALFATE 1 G PO TABS: 1.0000 g | ORAL_TABLET | Freq: Three times a day (TID) | ORAL | 0 refills | Status: DC
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
## 2018-06-06 NOTE — Discharge Instructions (Addendum)
Please pick up the Protonix that you were previously prescribed.  I have given you a prescription for Carafate today.  Please follow-up with your primary care doctor.

## 2018-06-06 NOTE — ED Triage Notes (Signed)
Pt BIB [facility] from a parking garage on [address] Pt c/o abdominal pain and nausea x5 days. Pt not cooperative with EMS. 150/p for BP and HR was all that was obtained from pt.

## 2018-06-11 ENCOUNTER — Emergency Department (HOSPITAL_COMMUNITY)
Admission: EM | Admit: 2018-06-11 | Discharge: 2018-06-11 | Disposition: A | Discharge status: Home / Self Care | Payer: Medicaid Other | Attending: Emergency Medicine | Admitting: Emergency Medicine

## 2018-06-11 ENCOUNTER — Encounter (HOSPITAL_COMMUNITY): Payer: Self-pay | Admitting: Emergency Medicine

## 2018-06-11 ENCOUNTER — Emergency Department (HOSPITAL_COMMUNITY): Payer: Medicaid Other

## 2018-06-11 DIAGNOSIS — I10 Essential (primary) hypertension: Secondary | ICD-10-CM | POA: Insufficient documentation

## 2018-06-11 DIAGNOSIS — R1011 Right upper quadrant pain: Secondary | ICD-10-CM | POA: Diagnosis not present

## 2018-06-11 DIAGNOSIS — F17210 Nicotine dependence, cigarettes, uncomplicated: Secondary | ICD-10-CM | POA: Insufficient documentation

## 2018-06-11 DIAGNOSIS — Z7982 Long term (current) use of aspirin: Secondary | ICD-10-CM | POA: Diagnosis not present

## 2018-06-11 DIAGNOSIS — R10811 Right upper quadrant abdominal tenderness: Secondary | ICD-10-CM

## 2018-06-11 LAB — COMPREHENSIVE METABOLIC PANEL
ALT: 61 U/L — ABNORMAL HIGH (ref 0–44)
ANION GAP: 12 (ref 5–15)
AST: 206 U/L — ABNORMAL HIGH (ref 15–41)
Albumin: 3.9 g/dL (ref 3.5–5.0)
Alkaline Phosphatase: 366 U/L — ABNORMAL HIGH (ref 38–126)
BUN: 12 mg/dL (ref 6–20)
CO2: 19 mmol/L — ABNORMAL LOW (ref 22–32)
Calcium: 9 mg/dL (ref 8.9–10.3)
Chloride: 105 mmol/L (ref 98–111)
Creatinine, Ser: 0.78 mg/dL (ref 0.61–1.24)
GFR calc Af Amer: >60 mL/min (ref >60)
GFR calc non Af Amer: >60 mL/min (ref >60)
GLUCOSE: 84 mg/dL (ref 70–99)
Potassium: 4.1 mmol/L (ref 3.5–5.1)
Sodium: 136 mmol/L (ref 135–145)
Total Bilirubin: 1 mg/dL (ref 0.3–1.2)
Total Protein: 8.8 g/dL — ABNORMAL HIGH (ref 6.5–8.1)

## 2018-06-11 LAB — URINALYSIS, ROUTINE W REFLEX MICROSCOPIC
BILIRUBIN URINE: NEGATIVE
Bacteria, UA: NONE SEEN
Glucose, UA: NEGATIVE mg/dL
Ketones, ur: NEGATIVE mg/dL
Leukocytes,Ua: NEGATIVE
Nitrite: NEGATIVE
Protein, ur: NEGATIVE mg/dL
Specific Gravity, Urine: 1.003 — ABNORMAL LOW (ref 1.005–1.030)
pH: 5 (ref 5.0–8.0)

## 2018-06-11 LAB — CBC
HCT: 35.1 % — ABNORMAL LOW (ref 39.0–52.0)
HEMOGLOBIN: 11.4 g/dL — AB (ref 13.0–17.0)
MCH: 30.2 pg (ref 26.0–34.0)
MCHC: 32.5 g/dL (ref 30.0–36.0)
MCV: 92.9 fL (ref 80.0–100.0)
Platelets: 257 10*3/uL (ref 150–400)
RBC: 3.78 MIL/uL — AB (ref 4.22–5.81)
RDW: 18 % — ABNORMAL HIGH (ref 11.5–15.5)
WBC: 5.1 10*3/uL (ref 4.0–10.5)
nRBC: 0 % (ref 0.0–0.2)

## 2018-06-11 LAB — LIPASE, BLOOD: Lipase: 49 U/L (ref 11–51)

## 2018-06-11 MED ORDER — ONDANSETRON 4 MG PO TBDP
4.0000 mg | ORAL_TABLET | Freq: Once | ORAL | Status: AC | PRN
  Administered 2018-06-11: 4 mg via ORAL
  Filled 2018-06-11: qty 1

## 2018-06-11 MED ORDER — FAMOTIDINE 20 MG PO TABS: 20.0000 mg | ORAL_TABLET | Freq: Two times a day (BID) | ORAL | 0 refills | Status: DC

## 2018-06-11 MED ORDER — METOPROLOL SUCCINATE ER 100 MG PO TB24: 100.0000 mg | ORAL_TABLET | Freq: Every day | ORAL | 1 refills | Status: DC

## 2018-06-11 MED ORDER — METOPROLOL SUCCINATE ER 100 MG PO TB24
100.0000 mg | ORAL_TABLET | Freq: Every day | ORAL | Status: DC
  Administered 2018-06-11: 100 mg via ORAL
  Filled 2018-06-11: qty 1

## 2018-06-11 MED ORDER — SODIUM CHLORIDE 0.9 % IV BOLUS
1000.0000 mL | Freq: Once | INTRAVENOUS | Status: AC
  Administered 2018-06-11: 1000 mL via INTRAVENOUS

## 2018-06-11 MED ORDER — PANTOPRAZOLE SODIUM 20 MG PO TBEC: 20.0000 mg | DELAYED_RELEASE_TABLET | Freq: Every day | ORAL | 1 refills | Status: DC

## 2018-06-11 MED ORDER — ONDANSETRON 4 MG PO TBDP: 4.0000 mg | ORAL_TABLET | Freq: Three times a day (TID) | ORAL | 0 refills | Status: DC | PRN

## 2018-06-11 MED ORDER — KETOROLAC TROMETHAMINE 30 MG/ML IJ SOLN
15.0000 mg | Freq: Once | INTRAMUSCULAR | Status: AC
  Administered 2018-06-11: 15 mg via INTRAVENOUS
  Filled 2018-06-11: qty 1

## 2018-06-11 NOTE — Discharge Instructions (Addendum)
Follow-up with your primary care provider soon as possible. Follow-up with the urologist for the testicular pain. Follow-up with the general surgeon for continued assessment of the right upper quadrant pain.   Diet: Start with a clear liquid diet, progressed to a full liquid diet, and then bland solids as you are able. Please adhere to the enclosed dietary suggestions.  In general, avoid NSAIDs (i.e. ibuprofen, naproxen, etc.), caffeine, alcohol, spicy foods, fatty foods, or any other foods that seem to cause your symptoms to arise.  Protonix: Take this medication daily, 20-30 minutes prior to your first meal, for the next 8 weeks.  Continue to take this medication even if you begin to feel better.  Pepcid: Take this medication twice a day for the next 5 days.  Follow-up: Please follow-up with your primary care provider on this matter.  Return: Return to the ED for significantly worsening symptoms, persistent vomiting, persistent fever, vomiting blood, blood in the stools, dark stools, or any other major concerns.  For prescription assistance, may try using prescription discount sites or apps, such as goodrx.com
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
## 2018-06-11 NOTE — ED Triage Notes (Signed)
Per [facility] pt homeless c/o abd pains. Hx cirrohsis and drank ETOH today.  Vitals HR80s, CBG 97

## 2018-06-19 ENCOUNTER — Emergency Department (HOSPITAL_COMMUNITY)
Admission: EM | Admit: 2018-06-19 | Discharge: 2018-06-20 | Disposition: A | Discharge status: Home / Self Care | Payer: Medicaid Other | Attending: Emergency Medicine | Admitting: Emergency Medicine

## 2018-06-19 ENCOUNTER — Encounter (HOSPITAL_COMMUNITY): Payer: Self-pay

## 2018-06-19 DIAGNOSIS — Z79899 Other long term (current) drug therapy: Secondary | ICD-10-CM | POA: Diagnosis not present

## 2018-06-19 DIAGNOSIS — I252 Old myocardial infarction: Secondary | ICD-10-CM | POA: Diagnosis not present

## 2018-06-19 DIAGNOSIS — I10 Essential (primary) hypertension: Secondary | ICD-10-CM | POA: Diagnosis not present

## 2018-06-19 DIAGNOSIS — R1013 Epigastric pain: Secondary | ICD-10-CM | POA: Insufficient documentation

## 2018-06-19 DIAGNOSIS — F17210 Nicotine dependence, cigarettes, uncomplicated: Secondary | ICD-10-CM | POA: Diagnosis not present

## 2018-06-19 DIAGNOSIS — Z7982 Long term (current) use of aspirin: Secondary | ICD-10-CM | POA: Diagnosis not present

## 2018-06-19 DIAGNOSIS — F10920 Alcohol use, unspecified with intoxication, uncomplicated: Secondary | ICD-10-CM

## 2018-06-19 LAB — CBC WITH DIFFERENTIAL/PLATELET
Abs Immature Granulocytes: 0.02 10*3/uL (ref 0.00–0.07)
Basophils Absolute: 0 10*3/uL (ref 0.0–0.1)
Basophils Relative: 1 %
Eosinophils Absolute: 0.1 10*3/uL (ref 0.0–0.5)
Eosinophils Relative: 1 %
HCT: 34.3 % — ABNORMAL LOW (ref 39.0–52.0)
Hemoglobin: 11.1 g/dL — ABNORMAL LOW (ref 13.0–17.0)
IMMATURE GRANULOCYTES: 0 %
LYMPHS ABS: 1.5 10*3/uL (ref 0.7–4.0)
Lymphocytes Relative: 32 %
MCH: 30.2 pg (ref 26.0–34.0)
MCHC: 32.4 g/dL (ref 30.0–36.0)
MCV: 93.2 fL (ref 80.0–100.0)
Monocytes Absolute: 0.6 10*3/uL (ref 0.1–1.0)
Monocytes Relative: 13 %
Neutro Abs: 2.4 10*3/uL (ref 1.7–7.7)
Neutrophils Relative %: 53 %
PLATELETS: 212 10*3/uL (ref 150–400)
RBC: 3.68 MIL/uL — ABNORMAL LOW (ref 4.22–5.81)
RDW: 20.3 % — AB (ref 11.5–15.5)
WBC: 4.5 10*3/uL (ref 4.0–10.5)
nRBC: 0 % (ref 0.0–0.2)

## 2018-06-19 LAB — COMPREHENSIVE METABOLIC PANEL
ALT: 56 U/L — ABNORMAL HIGH (ref 0–44)
AST: 144 U/L — ABNORMAL HIGH (ref 15–41)
Albumin: 3.8 g/dL (ref 3.5–5.0)
Alkaline Phosphatase: 331 U/L — ABNORMAL HIGH (ref 38–126)
Anion gap: 14 (ref 5–15)
BUN: 13 mg/dL (ref 6–20)
CO2: 20 mmol/L — ABNORMAL LOW (ref 22–32)
Calcium: 8.5 mg/dL — ABNORMAL LOW (ref 8.9–10.3)
Chloride: 101 mmol/L (ref 98–111)
Creatinine, Ser: 0.7 mg/dL (ref 0.61–1.24)
GFR calc Af Amer: >60 mL/min (ref >60)
GFR calc non Af Amer: >60 mL/min (ref >60)
GLUCOSE: 76 mg/dL (ref 70–99)
Potassium: 3.8 mmol/L (ref 3.5–5.1)
Sodium: 135 mmol/L (ref 135–145)
Total Bilirubin: 0.7 mg/dL (ref 0.3–1.2)
Total Protein: 8.3 g/dL — ABNORMAL HIGH (ref 6.5–8.1)

## 2018-06-19 LAB — LIPASE, BLOOD: Lipase: 40 U/L (ref 11–51)

## 2018-06-19 LAB — ETHANOL: Alcohol, Ethyl (B): 390 mg/dL (ref <10)

## 2018-06-19 MED ORDER — SODIUM CHLORIDE 0.9 % IV BOLUS
1000.0000 mL | Freq: Once | INTRAVENOUS | Status: AC
  Administered 2018-06-19: 1000 mL via INTRAVENOUS

## 2018-06-19 MED ORDER — KETOROLAC TROMETHAMINE 30 MG/ML IJ SOLN
30.0000 mg | Freq: Once | INTRAMUSCULAR | Status: AC
  Administered 2018-06-19: 30 mg via INTRAVENOUS
  Filled 2018-06-19: qty 1

## 2018-06-19 NOTE — Discharge Instructions (Addendum)
Follow-up with primary doctor for any new concerns.

## 2018-06-19 NOTE — ED Notes (Signed)
Date and time results received: 06/19/18 1505  Test: alcohol level  Critical Value: 390  Name of Provider Notified: Delo, MD  Waiting for orders will continue to monitor.

## 2018-06-19 NOTE — ED Triage Notes (Signed)
Patient arrived via [facility] from [place].   Patient c/o generalized abdominal pain.  Patient states he had "one" beer today.   Hx. cirrhosis of liver, substance abuse, pancreatitis.   A/Ox4 Ambulatory on scene with EMS.
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
## 2018-06-20 NOTE — ED Provider Notes (Signed)
Pt awake/alert,no distress, appears clinically sober will discharge   Ripley Fraise, MD 06/20/18 [phone]

## 2018-06-21 ENCOUNTER — Other Ambulatory Visit: Payer: Self-pay

## 2018-06-21 ENCOUNTER — Encounter (HOSPITAL_COMMUNITY): Payer: Self-pay

## 2018-06-21 ENCOUNTER — Emergency Department (HOSPITAL_COMMUNITY): Payer: Medicaid Other

## 2018-06-21 ENCOUNTER — Observation Stay (HOSPITAL_COMMUNITY)
Admission: EM | Admit: 2018-06-21 | Discharge: 2018-06-22 | Disposition: A | Discharge status: Home / Self Care | Payer: Medicaid Other | Attending: Internal Medicine | Admitting: Internal Medicine

## 2018-06-21 DIAGNOSIS — E785 Hyperlipidemia, unspecified: Secondary | ICD-10-CM | POA: Diagnosis not present

## 2018-06-21 DIAGNOSIS — R0602 Shortness of breath: Secondary | ICD-10-CM

## 2018-06-21 DIAGNOSIS — R002 Palpitations: Secondary | ICD-10-CM | POA: Insufficient documentation

## 2018-06-21 DIAGNOSIS — K210 Gastro-esophageal reflux disease with esophagitis: Secondary | ICD-10-CM | POA: Diagnosis not present

## 2018-06-21 DIAGNOSIS — Z9119 Patient's noncompliance with other medical treatment and regimen: Secondary | ICD-10-CM | POA: Diagnosis not present

## 2018-06-21 DIAGNOSIS — I119 Hypertensive heart disease without heart failure: Secondary | ICD-10-CM | POA: Diagnosis not present

## 2018-06-21 DIAGNOSIS — I43 Cardiomyopathy in diseases classified elsewhere: Secondary | ICD-10-CM | POA: Diagnosis not present

## 2018-06-21 DIAGNOSIS — I252 Old myocardial infarction: Secondary | ICD-10-CM | POA: Insufficient documentation

## 2018-06-21 DIAGNOSIS — Z590 Homelessness: Secondary | ICD-10-CM | POA: Insufficient documentation

## 2018-06-21 DIAGNOSIS — F1020 Alcohol dependence, uncomplicated: Secondary | ICD-10-CM | POA: Insufficient documentation

## 2018-06-21 DIAGNOSIS — F17210 Nicotine dependence, cigarettes, uncomplicated: Secondary | ICD-10-CM | POA: Insufficient documentation

## 2018-06-21 DIAGNOSIS — R079 Chest pain, unspecified: Principal | ICD-10-CM | POA: Insufficient documentation

## 2018-06-21 DIAGNOSIS — K219 Gastro-esophageal reflux disease without esophagitis: Secondary | ICD-10-CM

## 2018-06-21 DIAGNOSIS — F1290 Cannabis use, unspecified, uncomplicated: Secondary | ICD-10-CM | POA: Diagnosis not present

## 2018-06-21 DIAGNOSIS — Z7289 Other problems related to lifestyle: Secondary | ICD-10-CM

## 2018-06-21 DIAGNOSIS — R000 Tachycardia, unspecified: Secondary | ICD-10-CM

## 2018-06-21 DIAGNOSIS — Z720 Tobacco use: Secondary | ICD-10-CM

## 2018-06-21 DIAGNOSIS — K2920 Alcoholic gastritis without bleeding: Secondary | ICD-10-CM | POA: Diagnosis not present

## 2018-06-21 DIAGNOSIS — Z79899 Other long term (current) drug therapy: Secondary | ICD-10-CM

## 2018-06-21 DIAGNOSIS — R1013 Epigastric pain: Secondary | ICD-10-CM

## 2018-06-21 DIAGNOSIS — Z8249 Family history of ischemic heart disease and other diseases of the circulatory system: Secondary | ICD-10-CM | POA: Diagnosis not present

## 2018-06-21 DIAGNOSIS — I471 Supraventricular tachycardia: Secondary | ICD-10-CM | POA: Insufficient documentation

## 2018-06-21 LAB — CBC WITH DIFFERENTIAL/PLATELET
Abs Immature Granulocytes: 0.01 10*3/uL (ref 0.00–0.07)
Basophils Absolute: 0 10*3/uL (ref 0.0–0.1)
Basophils Relative: 1 %
Eosinophils Absolute: 0.1 10*3/uL (ref 0.0–0.5)
Eosinophils Relative: 3 %
HCT: 32.3 % — ABNORMAL LOW (ref 39.0–52.0)
Hemoglobin: 10.2 g/dL — ABNORMAL LOW (ref 13.0–17.0)
Immature Granulocytes: 0 %
Lymphocytes Relative: 35 %
Lymphs Abs: 1.2 10*3/uL (ref 0.7–4.0)
MCH: 29.2 pg (ref 26.0–34.0)
MCHC: 31.6 g/dL (ref 30.0–36.0)
MCV: 92.6 fL (ref 80.0–100.0)
Monocytes Absolute: 0.4 10*3/uL (ref 0.1–1.0)
Monocytes Relative: 12 %
Neutro Abs: 1.7 10*3/uL (ref 1.7–7.7)
Neutrophils Relative %: 49 %
Platelets: 207 10*3/uL (ref 150–400)
RBC: 3.49 MIL/uL — AB (ref 4.22–5.81)
RDW: 20.8 % — ABNORMAL HIGH (ref 11.5–15.5)
WBC: 3.4 10*3/uL — ABNORMAL LOW (ref 4.0–10.5)
nRBC: 0 % (ref 0.0–0.2)

## 2018-06-21 LAB — RAPID URINE DRUG SCREEN, HOSP PERFORMED
Amphetamines: NOT DETECTED
Barbiturates: NOT DETECTED
Benzodiazepines: NOT DETECTED
Cocaine: NOT DETECTED
Opiates: NOT DETECTED
TETRAHYDROCANNABINOL: POSITIVE — AB

## 2018-06-21 LAB — I-STAT TROPONIN, ED: Troponin i, poc: 0.01 ng/mL (ref 0.00–0.08)

## 2018-06-21 LAB — BASIC METABOLIC PANEL
Anion gap: 10 (ref 5–15)
BUN: 9 mg/dL (ref 6–20)
CO2: 23 mmol/L (ref 22–32)
Calcium: 8.8 mg/dL — ABNORMAL LOW (ref 8.9–10.3)
Chloride: 109 mmol/L (ref 98–111)
Creatinine, Ser: 0.85 mg/dL (ref 0.61–1.24)
GFR calc Af Amer: >60 mL/min (ref >60)
GFR calc non Af Amer: >60 mL/min (ref >60)
Glucose, Bld: 93 mg/dL (ref 70–99)
Potassium: 3.8 mmol/L (ref 3.5–5.1)
Sodium: 142 mmol/L (ref 135–145)

## 2018-06-21 LAB — MAGNESIUM: Magnesium: 1.7 mg/dL (ref 1.7–2.4)

## 2018-06-21 LAB — TROPONIN I
Troponin I: 0.03 ng/mL (ref <0.03)
Troponin I: <0.03 ng/mL (ref <0.03)

## 2018-06-21 LAB — D-DIMER, QUANTITATIVE: D-Dimer, Quant: 0.42 ug/mL-FEU (ref 0.00–0.50)

## 2018-06-21 LAB — ETHANOL: Alcohol, Ethyl (B): 41 mg/dL — ABNORMAL HIGH (ref <10)

## 2018-06-21 LAB — HIV ANTIBODY (ROUTINE TESTING W REFLEX): HIV Screen 4th Generation wRfx: NONREACTIVE

## 2018-06-21 MED ORDER — PANTOPRAZOLE SODIUM 40 MG IV SOLR
40.0000 mg | Freq: Once | INTRAVENOUS | Status: AC
  Administered 2018-06-21: 40 mg via INTRAVENOUS
  Filled 2018-06-21: qty 40

## 2018-06-21 MED ORDER — LORAZEPAM 1 MG PO TABS: 1.0000 mg | ORAL_TABLET | Freq: Four times a day (QID) | ORAL | Status: DC | PRN

## 2018-06-21 MED ORDER — METOPROLOL SUCCINATE ER 25 MG PO TB24
125.0000 mg | ORAL_TABLET | Freq: Every day | ORAL | Status: DC
  Administered 2018-06-21: 125 mg via ORAL
  Filled 2018-06-21: qty 1

## 2018-06-21 MED ORDER — ADULT MULTIVITAMIN W/MINERALS CH
1.0000 | ORAL_TABLET | Freq: Every day | ORAL | Status: DC
  Administered 2018-06-21 – 2018-06-22 (×2): 1 via ORAL
  Filled 2018-06-21 (×2): qty 1

## 2018-06-21 MED ORDER — ACETAMINOPHEN 650 MG RE SUPP: 650.0000 mg | Freq: Four times a day (QID) | RECTAL | Status: DC | PRN

## 2018-06-21 MED ORDER — PANTOPRAZOLE SODIUM 40 MG PO TBEC
40.0000 mg | DELAYED_RELEASE_TABLET | Freq: Every day | ORAL | Status: DC
  Administered 2018-06-21 – 2018-06-22 (×2): 40 mg via ORAL
  Filled 2018-06-21 (×2): qty 1

## 2018-06-21 MED ORDER — ALUM HYDROXIDE-MAG CARBONATE 95-358 MG/15ML PO SUSP
30.0000 mL | Freq: Three times a day (TID) | ORAL | Status: DC | PRN
  Administered 2018-06-22: 30 mL via ORAL
  Filled 2018-06-21 (×2): qty 30
  Filled 2018-06-21 (×2): qty 355

## 2018-06-21 MED ORDER — ACETAMINOPHEN 325 MG PO TABS: 650.0000 mg | ORAL_TABLET | Freq: Four times a day (QID) | ORAL | Status: DC | PRN

## 2018-06-21 MED ORDER — ENOXAPARIN SODIUM 40 MG/0.4ML ~~LOC~~ SOLN
40.0000 mg | SUBCUTANEOUS | Status: DC
  Administered 2018-06-21: 40 mg via SUBCUTANEOUS
  Filled 2018-06-21 (×2): qty 0.4

## 2018-06-21 MED ORDER — FOLIC ACID 1 MG PO TABS
1.0000 mg | ORAL_TABLET | Freq: Every day | ORAL | Status: DC
  Administered 2018-06-21 – 2018-06-22 (×2): 1 mg via ORAL
  Filled 2018-06-21 (×2): qty 1

## 2018-06-21 MED ORDER — THIAMINE HCL 100 MG/ML IJ SOLN
100.0000 mg | Freq: Every day | INTRAMUSCULAR | Status: DC
  Administered 2018-06-21 – 2018-06-22 (×2): 100 mg via INTRAVENOUS
  Filled 2018-06-21 (×2): qty 2

## 2018-06-21 MED ORDER — VITAMIN B-1 100 MG PO TABS: 100.0000 mg | ORAL_TABLET | Freq: Every day | ORAL | Status: DC

## 2018-06-21 MED ORDER — LORAZEPAM 1 MG PO TABS
0.0000 mg | ORAL_TABLET | Freq: Four times a day (QID) | ORAL | Status: DC
  Administered 2018-06-21 (×2): 1 mg via ORAL
  Filled 2018-06-21 (×2): qty 1

## 2018-06-21 MED ORDER — LORAZEPAM 1 MG PO TABS: 0.0000 mg | ORAL_TABLET | Freq: Two times a day (BID) | ORAL | Status: DC

## 2018-06-21 MED ORDER — SODIUM CHLORIDE 0.9 % IV BOLUS
1000.0000 mL | Freq: Once | INTRAVENOUS | Status: AC
  Administered 2018-06-21: 1000 mL via INTRAVENOUS

## 2018-06-21 MED ORDER — METOPROLOL SUCCINATE ER 100 MG PO TB24: 100.0000 mg | ORAL_TABLET | Freq: Every day | ORAL | Status: DC

## 2018-06-21 MED ORDER — LORAZEPAM 2 MG/ML IJ SOLN: 1.0000 mg | Freq: Four times a day (QID) | INTRAMUSCULAR | Status: DC | PRN

## 2018-06-21 MED ORDER — SENNOSIDES-DOCUSATE SODIUM 8.6-50 MG PO TABS: 1.0000 | ORAL_TABLET | Freq: Every evening | ORAL | Status: DC | PRN

## 2018-06-21 MED ORDER — METOPROLOL SUCCINATE ER 100 MG PO TB24
100.0000 mg | ORAL_TABLET | Freq: Every day | ORAL | Status: DC
  Administered 2018-06-21: 100 mg via ORAL
  Filled 2018-06-21: qty 1

## 2018-06-21 NOTE — ED Notes (Signed)
Attempted to call report

## 2018-06-21 NOTE — Progress Notes (Signed)
   Subjective: Patient was seen and evaluated at bedside on morning rounds. He admited this morning. Mentions that . He drank 2 beers yesterday and had 1 episode of vomiting after that. he then had some chest pain and shortness of breath and palpitation yesterday when he was walking. Currently has mild pain at substernal area and epigastric area. No other complaint.   Objective:  Vital signs in last 24 hours: Vitals:   06/21/18 0400 06/21/18 0415 06/21/18 0455 06/21/18 0650  BP: 113/71 130/89 133/79 (!) 129/97  Pulse: (!) 101 (!) 106 (!) 158 100  Resp: 18 (!) 25 15   Temp:      TempSrc:      SpO2:   98%    Physical Exam  Constitutional: Appears well-developed and well-nourished. No distress.  HENT:  Head: Normocephalic and atraumatic.  Eyes: Conjunctivae are normal.  Cardiovascular: Normal rate, regular rhythm and normal heart sounds.  Respiratory: Effort normal and breath sounds normal. No respiratory distress. No wheezes.  GI: Soft. Bowel sounds are normal. No distension. There is tenderness at epigastric area .  Musculoskeletal: No edema.  Neurological: Is alert.  Skin: Diaphoretic. No erythema.  Psychiatric: Normal mood and affect. Behavior is normal. Judgment and thought content normal.   Assessment/Plan:  Active Problems:   Chest pain Devin [name] is a 58 year old male with alcohol use disorder, GERD, HTN and homelessness presenting for evaluation of chest pain. Patient states that he was walking to his friend's house in the cold when he began to feel discomfort in the middle of his chest with associated palpitations and shortness of breath.  Chest pain, SOB, Palpitation:  EKG: MAT, with no acute ST-T changes. Trop negative. Heart score: 2 on admission. D-Dimer negative. Had a nl heart cath 2008. Echo last year EF 60-65%.  His C.P is likely due to gastritis (also mentions that chest pain started after emesis) He was seen at ED few days ago due to abdominal pain.  Lipase and LFT were all normal at that time. Pain improved. No further work up at this time but we will trend Trop and resume home dose of Metoprolol 100 mg QD MAT: mighe have been contributing with his SOB and palpitation. Patient with no pulmonary disease or known heart disease, metabolic disease or recent surgery as underlying cause of MAT, and etiology is not clear. Resumed his Matoprolol 100 mg QD Pulse rate at 96 now.    -Trend Trop x3 -Metoprolol succinate 100 mg QD  Abdominal pain GERD: Has some epigastric tenderness on exam. No evidence of acute abdoman or GI bleeding. -Protonix 40 mg IV once  Alcohol use disorder: EtOh level elevated at 41 on admission. Last drink yesterday. No evidence of withdrawal now. -CIWA with Ativan  -Thiamin 622 mg IV QD -Folic acid 1 mg Po QD  HTN: Normotensive today on Metoprolol 100 mg QD. -Continue Metoprolol succinate 100 mg QD  Dispo: Anticipated discharge in approximately 1 day  [name], MD 06/21/2018, 7:00 AM [id number]

## 2018-06-21 NOTE — ED Notes (Signed)
Breakfast ordered heart healthy  

## 2018-06-21 NOTE — Progress Notes (Addendum)
Patient arrived to 5C10 from ED> safety precautions and orders reviewed TELE applied. Pt denied pain at this time. No other distress noted. CIWA scored 0. Will continue to monitor.   Ave Filter, RN

## 2018-06-21 NOTE — ED Notes (Signed)
Patient transported to X-ray 
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
## 2018-06-21 NOTE — ED Triage Notes (Signed)
Pt arrived via [facility]; pt walking down the street and felt as if BP was elevated and he was about to pass out; pt asked local officer to call 911. EMS reports pt c/o CP 8/10 and administered 324 ASA, nitro x 2; which caused decrease in BP, systolic 86, rec'd bolus; 96% on RA; irreg HR 101-170.
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
## 2018-06-22 ENCOUNTER — Other Ambulatory Visit: Payer: Self-pay | Admitting: Internal Medicine

## 2018-06-22 DIAGNOSIS — K2920 Alcoholic gastritis without bleeding: Secondary | ICD-10-CM

## 2018-06-22 DIAGNOSIS — R079 Chest pain, unspecified: Secondary | ICD-10-CM | POA: Diagnosis not present

## 2018-06-22 DIAGNOSIS — R1013 Epigastric pain: Secondary | ICD-10-CM | POA: Diagnosis not present

## 2018-06-22 DIAGNOSIS — G8929 Other chronic pain: Secondary | ICD-10-CM

## 2018-06-22 DIAGNOSIS — I479 Paroxysmal tachycardia, unspecified: Secondary | ICD-10-CM

## 2018-06-22 DIAGNOSIS — I10 Essential (primary) hypertension: Secondary | ICD-10-CM

## 2018-06-22 MED ORDER — PANTOPRAZOLE SODIUM 40 MG PO TBEC: 40.0000 mg | DELAYED_RELEASE_TABLET | Freq: Every day | ORAL | 0 refills | Status: DC

## 2018-06-22 MED ORDER — FOLIC ACID 1 MG PO TABS: 1.0000 mg | ORAL_TABLET | Freq: Every day | ORAL | 0 refills | Status: DC

## 2018-06-22 MED ORDER — VITAMIN B-1 100 MG PO TABS: 100.0000 mg | ORAL_TABLET | Freq: Every day | ORAL | Status: DC

## 2018-06-22 MED ORDER — THIAMINE HCL 100 MG PO TABS: 100.0000 mg | ORAL_TABLET | Freq: Every day | ORAL | 0 refills | Status: DC

## 2018-06-22 MED ORDER — METOPROLOL SUCCINATE ER 50 MG PO TB24: 150.0000 mg | ORAL_TABLET | Freq: Every day | ORAL | 0 refills | Status: DC

## 2018-06-22 NOTE — Progress Notes (Signed)
Pt call light and belonigngs within reach Pt sleeping at this time Will continue to monitor

## 2018-06-22 NOTE — Progress Notes (Signed)
MD called and stated they will complete transitional medication from out pt pharmacy and deliver prior to discharge   Called out pt pharmacy at [phone], voicemail stated currently closed  Called main pharmacy to verify [phone], stated out pt is closed today   Informed MD, MD aware   Called social worker and asked for additional bus passes to allow for pt to transport to pharmacy and back

## 2018-06-22 NOTE — Care Management (Addendum)
Pt has active Medicaid, so no MATCH can be given.  Outpatient Pharmacy did not have pt's Medicaid information.  Pt's prescription copay will be $3 per [place].

## 2018-06-22 NOTE — Discharge Summary (Addendum)
Name: Devin [name] MRN: [id number] DOB: [birth year] 58 y.o. PCP: Katherine Roan, MD  Date of Admission: 06/21/2018 12:40 AM Date of Discharge: 06/22/2018 Attending Physician: Oval Linsey Discharge Diagnosis: Chest pain MAT Abdominal pain GERD   Alcohol use disorder Homelessness  Discharge Medications: Allergies as of 06/22/2018   No Known Allergies     Medication List    STOP taking these medications   famotidine 20 MG tablet Commonly known as:  PEPCID   ondansetron 4 MG disintegrating tablet Commonly known as:  ZOFRAN ODT     TAKE these medications   folic acid 1 MG tablet Commonly known as:  FOLVITE Take 1 tablet (1 mg total) by mouth daily.   metoprolol succinate 50 MG 24 hr tablet Commonly known as:  TOPROL-XL Take 3 tablets (150 mg total) by mouth daily. What changed:    medication strength  how much to take   pantoprazole 40 MG tablet Commonly known as:  PROTONIX Take 1 tablet (40 mg total) by mouth daily. What changed:    medication strength  how much to take   thiamine 100 MG tablet Take 1 tablet (100 mg total) by mouth daily.       Disposition and follow-up:   Devin [name] was discharged from [facility] in Stable condition.  At the hospital follow up visit please address:  Chest pain/MAT: patient has chronic alcoholic gastritis, he also experienced chest discomfort/palpitations when he was in MAT with a rate of about 160. ACS workup neg -make sure pt is taking his metoprolol  Alcohol use disorder: EtOh level elevated at 41 on admission. No withdrawal. social work gave a Building surveyor of programs in the area to help him quit. -make sure he is taking thiamine/folate -consider adding naltrexone if he has abstained  Homelessness: chronic issue for the patient, frequent ED visits -inquire about housing situation  2.  Labs / imaging needed at time of follow-up: none  3.  Pending labs/ test needing follow-up:  none  Follow-up Appointments: Follow-up Information    Katherine Roan, MD Follow up in 1 week(s).   Specialty:  Internal Medicine Why:  please follow up in our clinic in one week Contact information: [place] [id number] [phone]           Hospital Course by problem list: Chest pain MAT Abdominal pain GERD   Alcohol use disorder Homelessness  Patient was admitted for chest pain rule out and rate control of his multifocal atrial tachycardia.  His chest discomfort seemed to be related to his increased ventricular rate.  His epigastric pain from chronic alcohol use is also sometimes misconstrued as chest pain.  As we expected his ACS work-up returned negative.  Once he was restarted on metoprolol his symptoms significantly improved.  He was also restarted on PPI therapy which improved his epigastric pain.  We were able to titrate his metoprolol dose and he left in normal sinus rhythm.  We consulted social work so he would have access to programs in the area to help him quit his alcohol use and also find stable housing.  He was discharged with his prescriptions which only required a $3 co-pay we were able to get this sent to [facility] outpatient pharmacy which is within walking distance.  Unfortunately the [facility] pharmacy was closed.  He was also given bus passes for transportation.  During his stay he was monitored for signs and symptoms of alcohol  withdrawal.  Luckily there were none he required no benzodiazepines.  Discharge Vitals:   BP (!) 146/109 (BP Location: Left Arm)   Pulse 89   Temp 98.4 F (36.9 C) (Oral)   Resp 19   Ht 5\' 10"  (1.778 m)   Wt 68 kg Comment: per pt  SpO2 98%   BMI 21.52 kg/m   Pertinent Labs, Studies, and Procedures:  CBC Latest Ref Rng & Units 06/21/2018 06/19/2018 06/11/2018  WBC 4.0 - 10.5 K/uL 3.4(L) 4.5 5.1  Hemoglobin 13.0 - 17.0 g/dL 10.2(L) 11.1(L) 11.4(L)  Hematocrit 39.0 - 52.0 % 32.3(L) 34.3(L) 35.1(L)   Platelets 150 - 400 K/uL 207 212 257   BMP Latest Ref Rng & Units 06/21/2018 06/19/2018 06/11/2018  Glucose 70 - 99 mg/dL 93 76 84  BUN 6 - 20 mg/dL 9 13 12   Creatinine 0.61 - 1.24 mg/dL 0.85 0.70 0.78  BUN/Creat Ratio 9 - 20 - - -  Sodium 135 - 145 mmol/L 142 135 136  Potassium 3.5 - 5.1 mmol/L 3.8 3.8 4.1  Chloride 98 - 111 mmol/L 109 101 105  CO2 22 - 32 mmol/L 23 20(L) 19(L)  Calcium 8.9 - 10.3 mg/dL 8.8(L) 8.5(L) 9.0    Ref Range & Units 2d ago (06/21/18) 2d ago (06/21/18) 38mo ago (03/04/18) 49mo ago (03/04/18) 85mo ago (03/04/18) 5mo ago (03/03/18) 8mo ago (12/23/17)  Troponin I <0.03 ng/mL <0.03  0.03High Panic  CM 0.03High Panic  CM 0.03High Panic  CM 0.04High Panic  CM 0.04High Panic  CM <0.03 CM    Ref Range & Units 2d ago 8mo ago 66yr ago  D-Dimer, Quant 0.00 - 0.50 ug/mL-FEU 0.42  0.34 CM 0.43 CM    Ref Range & Units 2d ago (06/21/18) 4d ago (06/19/18) 33mo ago (03/07/18) 23mo ago (03/06/18)  Alcohol, Ethyl (B) <10 mg/dL 41High   390High Panic  CM 311High Panic  CM 33High  CM     Discharge Instructions: Discharge Instructions    Diet - low sodium heart healthy   Complete by:  As directed    Discharge instructions   Complete by:  As directed    Devin [name], we are glad you are feeling better.  I have given you prescription for your metoprolol.  This will help with your blood pressure and heart rate and decrease those palpitations you were having when you first arrived.  Please use the resources provided to you by our social worker to help abstain from alcohol and find a more permanent place to live.  We will see you for follow-up appointment back in our clinic.   Increase activity slowly   Complete by:  As directed       Signed: Katherine Roan, MD 06/23/2018, 11:22 AM

## 2018-06-22 NOTE — Progress Notes (Signed)
   Subjective: Devin [name] feels much better today.  He still has some epigastric pain which is chronic but improved.  He denies palpitations or chest discomfort.     Objective:  Vital signs in last 24 hours: Vitals:   06/21/18 2341 06/22/18 0546 06/22/18 0735 06/22/18 0814  BP: (!) 140/97 (!) 142/102 (!) 133/107   Pulse: 87 81 81   Resp: 20 17 18    Temp: 98.9 F (37.2 C) 98.6 F (37 C) 98.6 F (37 C)   TempSrc: Oral Oral Oral   SpO2: 98% 100% 100%   Weight:    68 kg  Height:    5\' 10"  (1.778 m)   Physical Exam   Cardiac: normal rate and rhythm, clear s1 and s2, no murmurs, rubs or gallops Pulmonary: CTAB, not in distress Abdominal: mildly distended abdomen, tenderness to palpation in upper quadrants, active bowel sounds Extremities: no LE edema Psych: Alert, conversant, in good spirits   Assessment/Plan:  Active Problems:   Chest pain [name] is a 58 year old male with alcohol use disorder, GERD, HTN and homelessness presenting for evaluation of chest pain. Patient states that he was walking to his friend's house in the cold when he began to feel discomfort in the middle of his chest with associated palpitations and shortness of breath.  Chest pain/MAT: patient has chronic alcoholic gastritis, he also experienced chest discomfort/palpitations when he was in MAT with a rate of about 160. ACS workup neg -d/c with metoprolol xl 125mg  daily  Abdominal pain GERD: Has some epigastric tenderness on exam. No evidence of acute abdomen or GI bleeding. -continue protonix -advised abstinence from alcohol -social work consulted to help with alcohol use disorder.    Alcohol use disorder: EtOh level elevated at 41 on admission. No withdrawal  -social work Haematologist of programs in our area  -continue thiamine/folate  Homelessness: chronic issue for the patient, frequent ED visits -social work provided list of shelters and resources in the area  HTN: bp improving on  metoprolol -continue metoprolol, pcp follow up  Dispo: Anticipated discharge today  Katherine Roan, MD 06/22/2018, 10:49 AM

## 2018-06-22 NOTE — Progress Notes (Addendum)
Informed pt that medication will cost $3  Pt states he needs to go see his pastor before picking up medication  Educated pt on importance of getting metoprolol today, informed that oupt. pharmacy is on hospital property and open till 4 pm  Pt not understanding  Educated pt on risks of elevated blood pressure, elevated heart rate and medication can help relieve his palpitations  Pt understanding and stated he will pick up medications later  Discharge education provided at bedside. Pt IV removed, catheter intact and telemetry removed.Pt has all belongings including 3 bus passes   Pt discharged via wheelchair with NT to bus stop

## 2018-06-22 NOTE — Progress Notes (Signed)
Patient's B/P 142/102 this am. Paged Dr Kennon Holter. CIWA 1. Awaiting orders, if any.

## 2018-06-22 NOTE — Progress Notes (Addendum)
10:15AM: CSW followed up with patient regarding alcohol use. CSW assessed patient's alcohol use and noted patient declined having difficulty and reports drinking 1-2 40oz a couple times a week. CSW processed patient's drinking and noted patient adamantly denied alcohol dependence. CSW provided patient with substance use community supports and discussed with patient utilizing them for support as needed.  CSW met with patient to discuss homelessness concerns. CSW provided patient with homelessness resource list. CSW discussed with patient utilizing said resources and reviewed the white flag program. CSW noted patient reported he had SSI and has been staying in motels and recently was living with his niece in [place] until they had a falling out over money. CSW noted patient reported he was working towards getting his own place. CSW discussed supports and planning to work towards that goal and noted patient identified the supportive role of a pastor in his life and processed with patient ways to advocate for supports for his housing. CSW is signing off, please re-consult for future social work needs.  Lamonte Richer, LCSW, [place] Worker II [phone]

## 2018-06-22 NOTE — Progress Notes (Signed)
Pt states he cannot afford medication  Outpt pharmacy stated metoprolol will cost $32 Called case manager, Caryl Pina aware  Stated she will call outpt pharmacy and call back

## 2018-06-22 NOTE — Progress Notes (Signed)
[facility] on, contact number at [phone], stated they will be open till 4 pm  Informed MD team  MD aware

## 2018-06-22 NOTE — Progress Notes (Signed)
Called social work regarding consult  Social work. Devin [name], aware and stated will be rounding soon

## 2018-06-25 ENCOUNTER — Telehealth: Payer: Self-pay | Admitting: *Deleted

## 2018-06-25 NOTE — Telephone Encounter (Signed)
CALLED PATIENT

## 2018-06-25 NOTE — Telephone Encounter (Signed)
lvm for patient to call urology to make appointment for to call opc if he wishes to cancel this referral.  Patient has not yet to this day called to re schedule this appointment.

## 2018-06-29 ENCOUNTER — Encounter (HOSPITAL_COMMUNITY): Payer: Self-pay

## 2018-06-29 ENCOUNTER — Other Ambulatory Visit: Payer: Self-pay

## 2018-06-29 ENCOUNTER — Emergency Department (HOSPITAL_COMMUNITY)
Admission: EM | Admit: 2018-06-29 | Discharge: 2018-06-29 | Disposition: A | Discharge status: Home / Self Care | Payer: Medicaid Other | Attending: Emergency Medicine | Admitting: Emergency Medicine

## 2018-06-29 DIAGNOSIS — F17210 Nicotine dependence, cigarettes, uncomplicated: Secondary | ICD-10-CM | POA: Diagnosis not present

## 2018-06-29 DIAGNOSIS — Z79899 Other long term (current) drug therapy: Secondary | ICD-10-CM | POA: Insufficient documentation

## 2018-06-29 DIAGNOSIS — I10 Essential (primary) hypertension: Secondary | ICD-10-CM | POA: Diagnosis not present

## 2018-06-29 DIAGNOSIS — G8929 Other chronic pain: Secondary | ICD-10-CM | POA: Diagnosis not present

## 2018-06-29 DIAGNOSIS — R1030 Lower abdominal pain, unspecified: Secondary | ICD-10-CM | POA: Diagnosis present

## 2018-06-29 DIAGNOSIS — R7989 Other specified abnormal findings of blood chemistry: Secondary | ICD-10-CM

## 2018-06-29 DIAGNOSIS — R109 Unspecified abdominal pain: Secondary | ICD-10-CM

## 2018-06-29 DIAGNOSIS — F1010 Alcohol abuse, uncomplicated: Secondary | ICD-10-CM | POA: Insufficient documentation

## 2018-06-29 DIAGNOSIS — R945 Abnormal results of liver function studies: Secondary | ICD-10-CM | POA: Diagnosis not present

## 2018-06-29 LAB — CBC WITH DIFFERENTIAL/PLATELET
Abs Immature Granulocytes: 0 10*3/uL (ref 0.00–0.07)
BASOS ABS: 0.1 10*3/uL (ref 0.0–0.1)
Basophils Relative: 2 %
Eosinophils Absolute: 0.1 10*3/uL (ref 0.0–0.5)
Eosinophils Relative: 3 %
HCT: 34.7 % — ABNORMAL LOW (ref 39.0–52.0)
Hemoglobin: 11 g/dL — ABNORMAL LOW (ref 13.0–17.0)
Lymphocytes Relative: 56 %
Lymphs Abs: 2 10*3/uL (ref 0.7–4.0)
MCH: 29.5 pg (ref 26.0–34.0)
MCHC: 31.7 g/dL (ref 30.0–36.0)
MCV: 93 fL (ref 80.0–100.0)
Monocytes Absolute: 0.2 10*3/uL (ref 0.1–1.0)
Monocytes Relative: 6 %
NEUTROS ABS: 1.2 10*3/uL — AB (ref 1.7–7.7)
NEUTROS PCT: 33 %
NRBC: 0 % (ref 0.0–0.2)
NRBC: 0 /100{WBCs}
Platelets: 259 10*3/uL (ref 150–400)
RBC: 3.73 MIL/uL — ABNORMAL LOW (ref 4.22–5.81)
RDW: 21.3 % — ABNORMAL HIGH (ref 11.5–15.5)
WBC: 3.5 10*3/uL — ABNORMAL LOW (ref 4.0–10.5)

## 2018-06-29 LAB — COMPREHENSIVE METABOLIC PANEL
ALBUMIN: 3.3 g/dL — AB (ref 3.5–5.0)
ALT: 47 U/L — ABNORMAL HIGH (ref 0–44)
AST: 98 U/L — ABNORMAL HIGH (ref 15–41)
Alkaline Phosphatase: 265 U/L — ABNORMAL HIGH (ref 38–126)
Anion gap: 10 (ref 5–15)
BUN: 13 mg/dL (ref 6–20)
CO2: 24 mmol/L (ref 22–32)
Calcium: 8.9 mg/dL (ref 8.9–10.3)
Chloride: 107 mmol/L (ref 98–111)
Creatinine, Ser: 0.75 mg/dL (ref 0.61–1.24)
GFR calc Af Amer: >60 mL/min (ref >60)
GFR calc non Af Amer: >60 mL/min (ref >60)
Glucose, Bld: 96 mg/dL (ref 70–99)
POTASSIUM: 3.6 mmol/L (ref 3.5–5.1)
Sodium: 141 mmol/L (ref 135–145)
Total Bilirubin: 0.6 mg/dL (ref 0.3–1.2)
Total Protein: 7.7 g/dL (ref 6.5–8.1)

## 2018-06-29 LAB — ETHANOL: ALCOHOL ETHYL (B): 328 mg/dL — AB (ref <10)

## 2018-06-29 LAB — LIPASE, BLOOD: LIPASE: 39 U/L (ref 11–51)

## 2018-06-29 MED ORDER — SODIUM CHLORIDE 0.9 % IV BOLUS
500.0000 mL | Freq: Once | INTRAVENOUS | Status: AC
  Administered 2018-06-29: 500 mL via INTRAVENOUS

## 2018-06-29 MED ORDER — ALUM & MAG HYDROXIDE-SIMETH 200-200-20 MG/5ML PO SUSP
30.0000 mL | Freq: Once | ORAL | Status: AC
  Administered 2018-06-29: 30 mL via ORAL
  Filled 2018-06-29: qty 30

## 2018-06-29 MED ORDER — LIDOCAINE VISCOUS HCL 2 % MT SOLN
15.0000 mL | Freq: Once | OROMUCOSAL | Status: AC
  Administered 2018-06-29: 15 mL via ORAL
  Filled 2018-06-29: qty 15

## 2018-06-29 MED ORDER — ONDANSETRON HCL 4 MG/2ML IJ SOLN
4.0000 mg | Freq: Once | INTRAMUSCULAR | Status: AC
  Administered 2018-06-29: 4 mg via INTRAVENOUS
  Filled 2018-06-29: qty 2

## 2018-06-29 NOTE — ED Notes (Signed)
Ambulated pt in hall.  Gait steady.  Pt states "I feel rough".   PA made aware.
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
## 2018-06-29 NOTE — ED Notes (Signed)
Gave pt water to drink and is drinking.

## 2018-06-29 NOTE — ED Notes (Signed)
Lab contacted and questioned about lab work collected at 1751 and not processed by 1618. They advised that the tubes were not in the lab and were also "not collected" per the pt's chart. Lab specimens were collected for the second time and resent to main lab.

## 2018-06-29 NOTE — Discharge Instructions (Addendum)
It is important that you stop drinking.  This is causing your liver issues and your chronic abdominal pain. I have included resources to help with you stopping drinking. It is important that you follow-up with the primary care doctor listed below. Return to the emergency room with any new, worsening, concerning symptoms.

## 2018-06-30 ENCOUNTER — Emergency Department (HOSPITAL_COMMUNITY): Payer: Medicaid Other

## 2018-06-30 ENCOUNTER — Other Ambulatory Visit: Payer: Self-pay

## 2018-06-30 ENCOUNTER — Emergency Department (HOSPITAL_COMMUNITY)
Admission: EM | Admit: 2018-06-30 | Discharge: 2018-06-30 | Disposition: A | Discharge status: Home / Self Care | Payer: Medicaid Other | Attending: Emergency Medicine | Admitting: Emergency Medicine

## 2018-06-30 DIAGNOSIS — R1110 Vomiting, unspecified: Secondary | ICD-10-CM | POA: Diagnosis present

## 2018-06-30 DIAGNOSIS — E860 Dehydration: Secondary | ICD-10-CM | POA: Insufficient documentation

## 2018-06-30 DIAGNOSIS — K2990 Gastroduodenitis, unspecified, without bleeding: Secondary | ICD-10-CM

## 2018-06-30 DIAGNOSIS — I493 Ventricular premature depolarization: Secondary | ICD-10-CM

## 2018-06-30 DIAGNOSIS — Z7982 Long term (current) use of aspirin: Secondary | ICD-10-CM | POA: Diagnosis not present

## 2018-06-30 DIAGNOSIS — R109 Unspecified abdominal pain: Secondary | ICD-10-CM | POA: Diagnosis not present

## 2018-06-30 DIAGNOSIS — I10 Essential (primary) hypertension: Secondary | ICD-10-CM | POA: Diagnosis not present

## 2018-06-30 DIAGNOSIS — F17210 Nicotine dependence, cigarettes, uncomplicated: Secondary | ICD-10-CM | POA: Diagnosis not present

## 2018-06-30 DIAGNOSIS — G8929 Other chronic pain: Secondary | ICD-10-CM

## 2018-06-30 LAB — CBC WITH DIFFERENTIAL/PLATELET
Abs Immature Granulocytes: 0 10*3/uL (ref 0.00–0.07)
Band Neutrophils: 1 %
Basophils Absolute: 0.1 10*3/uL (ref 0.0–0.1)
Basophils Relative: 4 %
Eosinophils Absolute: 0 10*3/uL (ref 0.0–0.5)
Eosinophils Relative: 0 %
HCT: 36.1 % — ABNORMAL LOW (ref 39.0–52.0)
Hemoglobin: 11.3 g/dL — ABNORMAL LOW (ref 13.0–17.0)
Lymphocytes Relative: 25 %
Lymphs Abs: 0.8 10*3/uL (ref 0.7–4.0)
MCH: 29.5 pg (ref 26.0–34.0)
MCHC: 31.3 g/dL (ref 30.0–36.0)
MCV: 94.3 fL (ref 80.0–100.0)
MONO ABS: 0.4 10*3/uL (ref 0.1–1.0)
Monocytes Relative: 13 %
Neutro Abs: 1.9 10*3/uL (ref 1.7–7.7)
Neutrophils Relative %: 57 %
Platelets: 223 10*3/uL (ref 150–400)
RBC: 3.83 MIL/uL — AB (ref 4.22–5.81)
RDW: 21.2 % — ABNORMAL HIGH (ref 11.5–15.5)
WBC: 3.3 10*3/uL — AB (ref 4.0–10.5)
nRBC: 0 % (ref 0.0–0.2)
nRBC: 0 /100 WBC

## 2018-06-30 LAB — PROTIME-INR
INR: 1.3 — ABNORMAL HIGH (ref 0.8–1.2)
Prothrombin Time: 16.5 seconds — ABNORMAL HIGH (ref 11.4–15.2)

## 2018-06-30 LAB — COMPREHENSIVE METABOLIC PANEL
ALT: 47 U/L — ABNORMAL HIGH (ref 0–44)
AST: 102 U/L — ABNORMAL HIGH (ref 15–41)
Albumin: 3.5 g/dL (ref 3.5–5.0)
Alkaline Phosphatase: 292 U/L — ABNORMAL HIGH (ref 38–126)
Anion gap: 12 (ref 5–15)
BUN: 9 mg/dL (ref 6–20)
CO2: 24 mmol/L (ref 22–32)
Calcium: 9.2 mg/dL (ref 8.9–10.3)
Chloride: 99 mmol/L (ref 98–111)
Creatinine, Ser: 0.83 mg/dL (ref 0.61–1.24)
GFR calc non Af Amer: >60 mL/min (ref >60)
Glucose, Bld: 93 mg/dL (ref 70–99)
Potassium: 3.9 mmol/L (ref 3.5–5.1)
Sodium: 135 mmol/L (ref 135–145)
Total Bilirubin: 1 mg/dL (ref 0.3–1.2)
Total Protein: 8.5 g/dL — ABNORMAL HIGH (ref 6.5–8.1)

## 2018-06-30 LAB — LIPASE, BLOOD: Lipase: 38 U/L (ref 11–51)

## 2018-06-30 MED ORDER — HYDROMORPHONE HCL 1 MG/ML IJ SOLN
1.0000 mg | Freq: Once | INTRAMUSCULAR | Status: AC
  Administered 2018-06-30: 1 mg via INTRAVENOUS
  Filled 2018-06-30: qty 1

## 2018-06-30 MED ORDER — LACTATED RINGERS IV BOLUS
1000.0000 mL | Freq: Once | INTRAVENOUS | Status: AC
  Administered 2018-06-30: 1000 mL via INTRAVENOUS

## 2018-06-30 MED ORDER — ONDANSETRON 4 MG PO TBDP: 4.0000 mg | ORAL_TABLET | Freq: Three times a day (TID) | ORAL | 0 refills | Status: DC | PRN

## 2018-06-30 MED ORDER — FAMOTIDINE 20 MG PO TABS: 20.0000 mg | ORAL_TABLET | Freq: Two times a day (BID) | ORAL | 0 refills | Status: DC

## 2018-06-30 MED ORDER — ALUM & MAG HYDROXIDE-SIMETH 200-200-20 MG/5ML PO SUSP
30.0000 mL | Freq: Once | ORAL | Status: AC
  Administered 2018-06-30: 30 mL via ORAL
  Filled 2018-06-30: qty 30

## 2018-06-30 MED ORDER — METOPROLOL SUCCINATE ER 25 MG PO TB24
150.0000 mg | ORAL_TABLET | Freq: Once | ORAL | Status: AC
  Administered 2018-06-30: 150 mg via ORAL
  Filled 2018-06-30: qty 1

## 2018-06-30 MED ORDER — METOPROLOL SUCCINATE ER 50 MG PO TB24: 150.0000 mg | ORAL_TABLET | Freq: Every day | ORAL | 0 refills | Status: DC

## 2018-06-30 MED ORDER — LIDOCAINE VISCOUS HCL 2 % MT SOLN
15.0000 mL | Freq: Once | OROMUCOSAL | Status: AC
  Administered 2018-06-30: 15 mL via ORAL
  Filled 2018-06-30: qty 15

## 2018-06-30 MED ORDER — PANTOPRAZOLE SODIUM 40 MG IV SOLR
40.0000 mg | Freq: Once | INTRAVENOUS | Status: AC
  Administered 2018-06-30: 40 mg via INTRAVENOUS
  Filled 2018-06-30: qty 40

## 2018-06-30 MED ORDER — PANTOPRAZOLE SODIUM 40 MG PO TBEC: 40.0000 mg | DELAYED_RELEASE_TABLET | Freq: Every day | ORAL | 0 refills | Status: DC

## 2018-06-30 MED ORDER — ONDANSETRON HCL 4 MG/2ML IJ SOLN
4.0000 mg | Freq: Once | INTRAMUSCULAR | Status: AC
  Administered 2018-06-30: 4 mg via INTRAVENOUS
  Filled 2018-06-30: qty 2

## 2018-06-30 NOTE — ED Notes (Signed)
Patient transported to X-ray 

## 2018-06-30 NOTE — Discharge Instructions (Addendum)
If you develop worsening, continued, or recurrent abdominal pain, uncontrolled vomiting, vomiting blood, fever, chest or back pain, or any other new/concerning symptoms then return to the ER for evaluation.

## 2018-06-30 NOTE — ED Triage Notes (Signed)
Pt reports that he had two episodes of bloody vomit this morning that started around 0400 this morning. Pt states that he feels like his "heart is racing." Pt is alert and oriented x4 at present. Pt reports abdominal pain that has been going on "for a while." Pt states the abdominal pain is worse this morning, but feels like previous abdominal pain he has been having.
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
## 2018-07-01 ENCOUNTER — Encounter: Payer: Self-pay | Admitting: Internal Medicine

## 2018-07-02 ENCOUNTER — Encounter (HOSPITAL_COMMUNITY): Payer: Self-pay

## 2018-07-02 ENCOUNTER — Emergency Department (HOSPITAL_COMMUNITY)
Admission: EM | Admit: 2018-07-02 | Discharge: 2018-07-02 | Disposition: A | Discharge status: Home / Self Care | Payer: Medicaid Other | Attending: Emergency Medicine | Admitting: Emergency Medicine

## 2018-07-02 ENCOUNTER — Other Ambulatory Visit: Payer: Self-pay

## 2018-07-02 DIAGNOSIS — F17210 Nicotine dependence, cigarettes, uncomplicated: Secondary | ICD-10-CM | POA: Insufficient documentation

## 2018-07-02 DIAGNOSIS — R1110 Vomiting, unspecified: Secondary | ICD-10-CM | POA: Diagnosis present

## 2018-07-02 DIAGNOSIS — Z7982 Long term (current) use of aspirin: Secondary | ICD-10-CM | POA: Insufficient documentation

## 2018-07-02 DIAGNOSIS — I10 Essential (primary) hypertension: Secondary | ICD-10-CM | POA: Insufficient documentation

## 2018-07-02 DIAGNOSIS — Z79899 Other long term (current) drug therapy: Secondary | ICD-10-CM | POA: Insufficient documentation

## 2018-07-02 DIAGNOSIS — R1013 Epigastric pain: Secondary | ICD-10-CM | POA: Diagnosis not present

## 2018-07-02 DIAGNOSIS — K2951 Unspecified chronic gastritis with bleeding: Secondary | ICD-10-CM | POA: Diagnosis not present

## 2018-07-02 DIAGNOSIS — I252 Old myocardial infarction: Secondary | ICD-10-CM | POA: Insufficient documentation

## 2018-07-02 LAB — URINALYSIS, ROUTINE W REFLEX MICROSCOPIC
Bilirubin Urine: NEGATIVE
Glucose, UA: NEGATIVE mg/dL
Ketones, ur: NEGATIVE mg/dL
Nitrite: NEGATIVE
Protein, ur: NEGATIVE mg/dL
RBC / HPF: >50 RBC/hpf — ABNORMAL HIGH (ref 0–5)
Specific Gravity, Urine: 1.029 (ref 1.005–1.030)
pH: 5 (ref 5.0–8.0)

## 2018-07-02 LAB — COMPREHENSIVE METABOLIC PANEL
ALK PHOS: 266 U/L — AB (ref 38–126)
ALT: 42 U/L (ref 0–44)
AST: 93 U/L — ABNORMAL HIGH (ref 15–41)
Albumin: 3.4 g/dL — ABNORMAL LOW (ref 3.5–5.0)
Anion gap: 10 (ref 5–15)
BUN: 17 mg/dL (ref 6–20)
CALCIUM: 8.8 mg/dL — AB (ref 8.9–10.3)
CO2: 23 mmol/L (ref 22–32)
Chloride: 103 mmol/L (ref 98–111)
Creatinine, Ser: 0.9 mg/dL (ref 0.61–1.24)
GFR calc Af Amer: >60 mL/min (ref >60)
GFR calc non Af Amer: >60 mL/min (ref >60)
Glucose, Bld: 145 mg/dL — ABNORMAL HIGH (ref 70–99)
Potassium: 3.3 mmol/L — ABNORMAL LOW (ref 3.5–5.1)
Sodium: 136 mmol/L (ref 135–145)
Total Bilirubin: 0.9 mg/dL (ref 0.3–1.2)
Total Protein: 7.6 g/dL (ref 6.5–8.1)

## 2018-07-02 LAB — CBC
HCT: 33 % — ABNORMAL LOW (ref 39.0–52.0)
Hemoglobin: 10.5 g/dL — ABNORMAL LOW (ref 13.0–17.0)
MCH: 29.6 pg (ref 26.0–34.0)
MCHC: 31.8 g/dL (ref 30.0–36.0)
MCV: 93 fL (ref 80.0–100.0)
Platelets: 243 10*3/uL (ref 150–400)
RBC: 3.55 MIL/uL — ABNORMAL LOW (ref 4.22–5.81)
RDW: 20.3 % — AB (ref 11.5–15.5)
WBC: 4 10*3/uL (ref 4.0–10.5)
nRBC: 0 % (ref 0.0–0.2)

## 2018-07-02 LAB — LIPASE, BLOOD: Lipase: 46 U/L (ref 11–51)

## 2018-07-02 MED ORDER — METOPROLOL TARTRATE 25 MG PO TABS
25.0000 mg | ORAL_TABLET | Freq: Once | ORAL | Status: AC
  Administered 2018-07-02: 25 mg via ORAL
  Filled 2018-07-02: qty 1

## 2018-07-02 MED ORDER — PANTOPRAZOLE SODIUM 40 MG IV SOLR
40.0000 mg | Freq: Once | INTRAVENOUS | Status: AC
  Administered 2018-07-02: 40 mg via INTRAVENOUS
  Filled 2018-07-02: qty 40

## 2018-07-02 MED ORDER — POTASSIUM CHLORIDE CRYS ER 20 MEQ PO TBCR
40.0000 meq | EXTENDED_RELEASE_TABLET | Freq: Once | ORAL | Status: AC
  Administered 2018-07-02: 40 meq via ORAL
  Filled 2018-07-02: qty 2

## 2018-07-02 MED ORDER — SODIUM CHLORIDE 0.9% FLUSH
3.0000 mL | Freq: Once | INTRAVENOUS | Status: AC
  Administered 2018-07-02: 3 mL via INTRAVENOUS

## 2018-07-02 MED ORDER — SODIUM CHLORIDE 0.9 % IV BOLUS (SEPSIS)
1000.0000 mL | Freq: Once | INTRAVENOUS | Status: AC
  Administered 2018-07-02: 1000 mL via INTRAVENOUS

## 2018-07-02 MED ORDER — ONDANSETRON HCL 4 MG/2ML IJ SOLN
4.0000 mg | Freq: Once | INTRAMUSCULAR | Status: AC
  Administered 2018-07-02: 4 mg via INTRAVENOUS
  Filled 2018-07-02: qty 2

## 2018-07-02 NOTE — Discharge Instructions (Signed)
If you continue to drink alcohol you will continue to have the same problems.  You will also continue to have these problems if you do not take your medications as prescribed.  Please fill your prescriptions and take your medications.  Return to the emergency department if your symptoms are getting worse.  Otherwise you need to follow-up with your primary care doctor and the GI specialist for further work-up. Thank you for allowing me to care for you today. Please return to the emergency department if you have new or worsening symptoms. Take your medications as instructed.
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
## 2018-07-02 NOTE — ED Notes (Signed)
Patient verbalizes understanding of discharge instructions. Opportunity for questioning and answers were provided. Armband removed by staff, pt discharged from ED ambulatory.   

## 2018-07-02 NOTE — ED Triage Notes (Signed)
Pt reports vomiting blood today, was seen 2 days ago for the same. Pt a.o, nad noted.

## 2018-07-03 ENCOUNTER — Emergency Department (HOSPITAL_COMMUNITY)
Admission: EM | Admit: 2018-07-03 | Discharge: 2018-07-04 | Disposition: A | Discharge status: Home / Self Care | Payer: Medicaid Other | Attending: Emergency Medicine | Admitting: Emergency Medicine

## 2018-07-03 ENCOUNTER — Other Ambulatory Visit: Payer: Self-pay

## 2018-07-03 ENCOUNTER — Encounter (HOSPITAL_COMMUNITY): Payer: Self-pay | Admitting: Emergency Medicine

## 2018-07-03 ENCOUNTER — Telehealth: Payer: Self-pay | Admitting: *Deleted

## 2018-07-03 DIAGNOSIS — I10 Essential (primary) hypertension: Secondary | ICD-10-CM | POA: Insufficient documentation

## 2018-07-03 DIAGNOSIS — R945 Abnormal results of liver function studies: Secondary | ICD-10-CM | POA: Diagnosis not present

## 2018-07-03 DIAGNOSIS — R7989 Other specified abnormal findings of blood chemistry: Secondary | ICD-10-CM | POA: Diagnosis not present

## 2018-07-03 DIAGNOSIS — F10120 Alcohol abuse with intoxication, uncomplicated: Secondary | ICD-10-CM | POA: Diagnosis not present

## 2018-07-03 DIAGNOSIS — F17210 Nicotine dependence, cigarettes, uncomplicated: Secondary | ICD-10-CM | POA: Diagnosis not present

## 2018-07-03 DIAGNOSIS — Z590 Homelessness: Secondary | ICD-10-CM | POA: Diagnosis not present

## 2018-07-03 DIAGNOSIS — R1013 Epigastric pain: Secondary | ICD-10-CM | POA: Insufficient documentation

## 2018-07-03 DIAGNOSIS — D649 Anemia, unspecified: Secondary | ICD-10-CM | POA: Insufficient documentation

## 2018-07-03 DIAGNOSIS — R1110 Vomiting, unspecified: Secondary | ICD-10-CM | POA: Diagnosis present

## 2018-07-03 DIAGNOSIS — F329 Major depressive disorder, single episode, unspecified: Secondary | ICD-10-CM | POA: Insufficient documentation

## 2018-07-03 DIAGNOSIS — Z7982 Long term (current) use of aspirin: Secondary | ICD-10-CM | POA: Insufficient documentation

## 2018-07-03 DIAGNOSIS — F10920 Alcohol use, unspecified with intoxication, uncomplicated: Secondary | ICD-10-CM

## 2018-07-03 DIAGNOSIS — R778 Other specified abnormalities of plasma proteins: Secondary | ICD-10-CM

## 2018-07-03 DIAGNOSIS — Z79899 Other long term (current) drug therapy: Secondary | ICD-10-CM | POA: Diagnosis not present

## 2018-07-03 DIAGNOSIS — R112 Nausea with vomiting, unspecified: Secondary | ICD-10-CM

## 2018-07-03 DIAGNOSIS — I252 Old myocardial infarction: Secondary | ICD-10-CM | POA: Insufficient documentation

## 2018-07-03 DIAGNOSIS — R748 Abnormal levels of other serum enzymes: Secondary | ICD-10-CM

## 2018-07-03 MED ORDER — PANTOPRAZOLE SODIUM 40 MG PO TBEC
40.0000 mg | DELAYED_RELEASE_TABLET | Freq: Once | ORAL | Status: AC
  Administered 2018-07-03: 40 mg via ORAL
  Filled 2018-07-03: qty 1

## 2018-07-03 MED ORDER — SODIUM CHLORIDE 0.9 % IV BOLUS
1000.0000 mL | Freq: Once | INTRAVENOUS | Status: AC
  Administered 2018-07-03: 1000 mL via INTRAVENOUS

## 2018-07-03 MED ORDER — ONDANSETRON HCL 4 MG/2ML IJ SOLN
4.0000 mg | Freq: Once | INTRAMUSCULAR | Status: AC
  Administered 2018-07-03: 4 mg via INTRAVENOUS
  Filled 2018-07-03: qty 2

## 2018-07-03 MED ORDER — ALUM & MAG HYDROXIDE-SIMETH 200-200-20 MG/5ML PO SUSP
30.0000 mL | Freq: Once | ORAL | Status: AC
  Administered 2018-07-03: 30 mL via ORAL
  Filled 2018-07-03: qty 30

## 2018-07-03 MED ORDER — LIDOCAINE VISCOUS HCL 2 % MT SOLN
15.0000 mL | Freq: Once | OROMUCOSAL | Status: AC
  Administered 2018-07-03: 15 mL via ORAL
  Filled 2018-07-03: qty 15

## 2018-07-03 NOTE — ED Triage Notes (Signed)
BIB EMS from [facility] Pt reports central CP and single episode of emesis with bright red blood. Hx MI in 2003, supposed to be on plavix, metoprolo, atenolol but not taking any of them. Given 335mL NS en route for some initial hypotension noted by EMS (SBP 90).
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
## 2018-07-04 ENCOUNTER — Emergency Department (HOSPITAL_COMMUNITY)
Admission: EM | Admit: 2018-07-04 | Discharge: 2018-07-05 | Disposition: A | Discharge status: Home / Self Care | Payer: Medicaid Other | Source: Home / Self Care | Attending: Emergency Medicine | Admitting: Emergency Medicine

## 2018-07-04 ENCOUNTER — Other Ambulatory Visit: Payer: Self-pay

## 2018-07-04 DIAGNOSIS — I10 Essential (primary) hypertension: Secondary | ICD-10-CM | POA: Insufficient documentation

## 2018-07-04 DIAGNOSIS — Z79899 Other long term (current) drug therapy: Secondary | ICD-10-CM

## 2018-07-04 DIAGNOSIS — I252 Old myocardial infarction: Secondary | ICD-10-CM

## 2018-07-04 DIAGNOSIS — Z7982 Long term (current) use of aspirin: Secondary | ICD-10-CM | POA: Insufficient documentation

## 2018-07-04 DIAGNOSIS — K2920 Alcoholic gastritis without bleeding: Secondary | ICD-10-CM

## 2018-07-04 DIAGNOSIS — F17210 Nicotine dependence, cigarettes, uncomplicated: Secondary | ICD-10-CM | POA: Insufficient documentation

## 2018-07-04 LAB — CBC WITH DIFFERENTIAL/PLATELET
ABS IMMATURE GRANULOCYTES: 0.02 10*3/uL (ref 0.00–0.07)
Abs Immature Granulocytes: 0 10*3/uL (ref 0.00–0.07)
BASOS ABS: 0 10*3/uL (ref 0.0–0.1)
BASOS PCT: 1 %
Basophils Absolute: 0 10*3/uL (ref 0.0–0.1)
Basophils Relative: 1 %
Eosinophils Absolute: 0.2 10*3/uL (ref 0.0–0.5)
Eosinophils Absolute: 0.2 10*3/uL (ref 0.0–0.5)
Eosinophils Relative: 5 %
Eosinophils Relative: 4 %
HCT: 34.1 % — ABNORMAL LOW (ref 39.0–52.0)
HEMATOCRIT: 35.2 % — AB (ref 39.0–52.0)
Hemoglobin: 10.8 g/dL — ABNORMAL LOW (ref 13.0–17.0)
Hemoglobin: 11 g/dL — ABNORMAL LOW (ref 13.0–17.0)
Immature Granulocytes: 0 %
Immature Granulocytes: 1 %
Lymphocytes Relative: 42 %
Lymphocytes Relative: 36 %
Lymphs Abs: 1.5 10*3/uL (ref 0.7–4.0)
Lymphs Abs: 1.5 10*3/uL (ref 0.7–4.0)
MCH: 29.7 pg (ref 26.0–34.0)
MCH: 30.1 pg (ref 26.0–34.0)
MCHC: 31.7 g/dL (ref 30.0–36.0)
MCHC: 31.3 g/dL (ref 30.0–36.0)
MCV: 93.7 fL (ref 80.0–100.0)
MCV: 96.2 fL (ref 80.0–100.0)
Monocytes Absolute: 0.4 10*3/uL (ref 0.1–1.0)
Monocytes Absolute: 0.5 10*3/uL (ref 0.1–1.0)
Monocytes Relative: 12 %
Monocytes Relative: 12 %
NRBC: 0 % (ref 0.0–0.2)
Neutro Abs: 1.4 10*3/uL — ABNORMAL LOW (ref 1.7–7.7)
Neutro Abs: 2.1 10*3/uL (ref 1.7–7.7)
Neutrophils Relative %: 40 %
Neutrophils Relative %: 46 %
Platelets: 230 10*3/uL (ref 150–400)
Platelets: 203 10*3/uL (ref 150–400)
RBC: 3.64 MIL/uL — ABNORMAL LOW (ref 4.22–5.81)
RBC: 3.66 MIL/uL — ABNORMAL LOW (ref 4.22–5.81)
RDW: 20.7 % — ABNORMAL HIGH (ref 11.5–15.5)
RDW: 20.7 % — ABNORMAL HIGH (ref 11.5–15.5)
WBC: 3.5 10*3/uL — AB (ref 4.0–10.5)
WBC: 4.3 10*3/uL (ref 4.0–10.5)
nRBC: 0 % (ref 0.0–0.2)

## 2018-07-04 LAB — COMPREHENSIVE METABOLIC PANEL
ALT: 45 U/L — AB (ref 0–44)
ALT: 45 U/L — ABNORMAL HIGH (ref 0–44)
ANION GAP: 11 (ref 5–15)
AST: 105 U/L — ABNORMAL HIGH (ref 15–41)
AST: 93 U/L — ABNORMAL HIGH (ref 15–41)
Albumin: 3.2 g/dL — ABNORMAL LOW (ref 3.5–5.0)
Albumin: 3.7 g/dL (ref 3.5–5.0)
Alkaline Phosphatase: 269 U/L — ABNORMAL HIGH (ref 38–126)
Alkaline Phosphatase: 281 U/L — ABNORMAL HIGH (ref 38–126)
Anion gap: 9 (ref 5–15)
BUN: 14 mg/dL (ref 6–20)
BUN: 17 mg/dL (ref 6–20)
CALCIUM: 8.6 mg/dL — AB (ref 8.9–10.3)
CO2: 20 mmol/L — ABNORMAL LOW (ref 22–32)
CO2: 23 mmol/L (ref 22–32)
CREATININE: 1.15 mg/dL (ref 0.61–1.24)
Calcium: 8.5 mg/dL — ABNORMAL LOW (ref 8.9–10.3)
Chloride: 108 mmol/L (ref 98–111)
Chloride: 107 mmol/L (ref 98–111)
Creatinine, Ser: 0.88 mg/dL (ref 0.61–1.24)
GFR calc Af Amer: >60 mL/min (ref >60)
GFR calc non Af Amer: >60 mL/min (ref >60)
GFR calc non Af Amer: >60 mL/min (ref >60)
Glucose, Bld: 111 mg/dL — ABNORMAL HIGH (ref 70–99)
Glucose, Bld: 92 mg/dL (ref 70–99)
Potassium: 3.7 mmol/L (ref 3.5–5.1)
Potassium: 3.8 mmol/L (ref 3.5–5.1)
Sodium: 139 mmol/L (ref 135–145)
Sodium: 139 mmol/L (ref 135–145)
Total Bilirubin: 0.6 mg/dL (ref 0.3–1.2)
Total Bilirubin: 0.8 mg/dL (ref 0.3–1.2)
Total Protein: 7.4 g/dL (ref 6.5–8.1)
Total Protein: 8.3 g/dL — ABNORMAL HIGH (ref 6.5–8.1)

## 2018-07-04 LAB — LIPASE, BLOOD
Lipase: 57 U/L — ABNORMAL HIGH (ref 11–51)
Lipase: 65 U/L — ABNORMAL HIGH (ref 11–51)

## 2018-07-04 LAB — HEMOGLOBIN AND HEMATOCRIT, BLOOD
HCT: 31 % — ABNORMAL LOW (ref 39.0–52.0)
HEMATOCRIT: 33.4 % — AB (ref 39.0–52.0)
HEMOGLOBIN: 10.4 g/dL — AB (ref 13.0–17.0)
Hemoglobin: 9.9 g/dL — ABNORMAL LOW (ref 13.0–17.0)

## 2018-07-04 LAB — I-STAT TROPONIN, ED: Troponin i, poc: 0.01 ng/mL (ref 0.00–0.08)

## 2018-07-04 LAB — TROPONIN I: Troponin I: 0.03 ng/mL (ref <0.03)

## 2018-07-04 LAB — ETHANOL: Alcohol, Ethyl (B): 264 mg/dL — ABNORMAL HIGH (ref <10)

## 2018-07-04 MED ORDER — ACETAMINOPHEN 325 MG PO TABS
650.0000 mg | ORAL_TABLET | Freq: Once | ORAL | Status: AC
  Administered 2018-07-04: 650 mg via ORAL
  Filled 2018-07-04: qty 2

## 2018-07-04 MED ORDER — ONDANSETRON 4 MG PO TBDP: 4.0000 mg | ORAL_TABLET | Freq: Three times a day (TID) | ORAL | 0 refills | Status: DC | PRN

## 2018-07-04 MED ORDER — SODIUM CHLORIDE 0.9 % IV BOLUS
1000.0000 mL | Freq: Once | INTRAVENOUS | Status: AC
  Administered 2018-07-04: 1000 mL via INTRAVENOUS

## 2018-07-04 MED ORDER — PANTOPRAZOLE SODIUM 40 MG PO TBEC: 40.0000 mg | DELAYED_RELEASE_TABLET | Freq: Every day | ORAL | 0 refills | Status: DC

## 2018-07-04 NOTE — ED Notes (Signed)
Discovered pt was eating leftover food in the room. Food was moved to counter and pt was told again that he should not eat until cleared by the doctor.

## 2018-07-04 NOTE — ED Triage Notes (Signed)
Per EMS, Pt consumed unknown amount of ETOH. Presenting [place] garden. Complaining of RLQ pain.

## 2018-07-04 NOTE — Discharge Instructions (Signed)
Do not drink any alcohol, even beer.  Alcohol is very irritating to the stomach and can make your stomach bleed.  Get the prescription for pantoprazole (Protonix) filled and take it every day.  It will not help you if you do not take it.

## 2018-07-04 NOTE — ED Notes (Addendum)
EKG unable to export. Printed and handed to Dr. Billy Fischer.
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
## 2018-07-04 NOTE — ED Notes (Signed)
Bed: WA06 Expected date:  Expected time:  Means of arrival:  Comments: RLQ pain/etoh

## 2018-07-17 ENCOUNTER — Emergency Department (HOSPITAL_COMMUNITY)
Admission: EM | Admit: 2018-07-17 | Discharge: 2018-07-18 | Disposition: A | Discharge status: Home / Self Care | Payer: Medicaid Other | Attending: Emergency Medicine | Admitting: Emergency Medicine

## 2018-07-17 ENCOUNTER — Other Ambulatory Visit: Payer: Self-pay

## 2018-07-17 ENCOUNTER — Emergency Department (HOSPITAL_COMMUNITY): Payer: Medicaid Other

## 2018-07-17 ENCOUNTER — Encounter (HOSPITAL_COMMUNITY): Payer: Self-pay | Admitting: Emergency Medicine

## 2018-07-17 DIAGNOSIS — Z7982 Long term (current) use of aspirin: Secondary | ICD-10-CM | POA: Diagnosis not present

## 2018-07-17 DIAGNOSIS — Z79899 Other long term (current) drug therapy: Secondary | ICD-10-CM | POA: Insufficient documentation

## 2018-07-17 DIAGNOSIS — I252 Old myocardial infarction: Secondary | ICD-10-CM | POA: Insufficient documentation

## 2018-07-17 DIAGNOSIS — R0789 Other chest pain: Secondary | ICD-10-CM | POA: Diagnosis present

## 2018-07-17 DIAGNOSIS — F17210 Nicotine dependence, cigarettes, uncomplicated: Secondary | ICD-10-CM | POA: Insufficient documentation

## 2018-07-17 DIAGNOSIS — I10 Essential (primary) hypertension: Secondary | ICD-10-CM | POA: Insufficient documentation

## 2018-07-17 DIAGNOSIS — K2920 Alcoholic gastritis without bleeding: Secondary | ICD-10-CM | POA: Diagnosis not present

## 2018-07-17 LAB — I-STAT TROPONIN, ED: Troponin i, poc: 0.01 ng/mL (ref 0.00–0.08)

## 2018-07-17 MED ORDER — ONDANSETRON 4 MG PO TBDP
4.0000 mg | ORAL_TABLET | Freq: Once | ORAL | Status: AC
  Administered 2018-07-18: 4 mg via ORAL
  Filled 2018-07-17: qty 1

## 2018-07-17 MED ORDER — ALUM & MAG HYDROXIDE-SIMETH 200-200-20 MG/5ML PO SUSP
30.0000 mL | Freq: Once | ORAL | Status: AC
  Administered 2018-07-18: 30 mL via ORAL
  Filled 2018-07-17: qty 30

## 2018-07-17 MED ORDER — LIDOCAINE VISCOUS HCL 2 % MT SOLN
15.0000 mL | Freq: Once | OROMUCOSAL | Status: AC
  Administered 2018-07-18: 15 mL via ORAL
  Filled 2018-07-17: qty 15

## 2018-07-17 NOTE — ED Triage Notes (Signed)
Patient arrived with EMS from home reports generalized abdominal pain and central chest pain onset this evening , denies emesis or diarrhea , respirations unlabored , + ETOH , history of alcoholic liver disease .
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
## 2018-07-18 LAB — CBC WITH DIFFERENTIAL/PLATELET
Abs Immature Granulocytes: 0.01 10*3/uL (ref 0.00–0.07)
Basophils Absolute: 0 10*3/uL (ref 0.0–0.1)
Basophils Relative: 1 %
Eosinophils Absolute: 0.2 10*3/uL (ref 0.0–0.5)
Eosinophils Relative: 3 %
HCT: 34 % — ABNORMAL LOW (ref 39.0–52.0)
Hemoglobin: 11.3 g/dL — ABNORMAL LOW (ref 13.0–17.0)
Immature Granulocytes: 0 %
Lymphocytes Relative: 33 %
Lymphs Abs: 1.6 10*3/uL (ref 0.7–4.0)
MCH: 31.7 pg (ref 26.0–34.0)
MCHC: 33.2 g/dL (ref 30.0–36.0)
MCV: 95.2 fL (ref 80.0–100.0)
Monocytes Absolute: 0.7 10*3/uL (ref 0.1–1.0)
Monocytes Relative: 14 %
NEUTROS PCT: 49 %
Neutro Abs: 2.4 10*3/uL (ref 1.7–7.7)
PLATELETS: 206 10*3/uL (ref 150–400)
RBC: 3.57 MIL/uL — ABNORMAL LOW (ref 4.22–5.81)
RDW: 20 % — ABNORMAL HIGH (ref 11.5–15.5)
WBC: 5 10*3/uL (ref 4.0–10.5)
nRBC: 0 % (ref 0.0–0.2)

## 2018-07-18 LAB — COMPREHENSIVE METABOLIC PANEL
ALT: 59 U/L — ABNORMAL HIGH (ref 0–44)
AST: 137 U/L — ABNORMAL HIGH (ref 15–41)
Albumin: 3.4 g/dL — ABNORMAL LOW (ref 3.5–5.0)
Alkaline Phosphatase: 309 U/L — ABNORMAL HIGH (ref 38–126)
Anion gap: 12 (ref 5–15)
BUN: 5 mg/dL — ABNORMAL LOW (ref 6–20)
CHLORIDE: 102 mmol/L (ref 98–111)
CO2: 20 mmol/L — AB (ref 22–32)
Calcium: 8.8 mg/dL — ABNORMAL LOW (ref 8.9–10.3)
Creatinine, Ser: 0.8 mg/dL (ref 0.61–1.24)
GFR calc non Af Amer: >60 mL/min (ref >60)
Glucose, Bld: 84 mg/dL (ref 70–99)
Potassium: 3.5 mmol/L (ref 3.5–5.1)
Sodium: 134 mmol/L — ABNORMAL LOW (ref 135–145)
Total Bilirubin: 0.7 mg/dL (ref 0.3–1.2)
Total Protein: 8.2 g/dL — ABNORMAL HIGH (ref 6.5–8.1)

## 2018-07-18 LAB — I-STAT TROPONIN, ED: Troponin i, poc: 0.01 ng/mL (ref 0.00–0.08)

## 2018-07-18 LAB — LIPASE, BLOOD: Lipase: 51 U/L (ref 11–51)

## 2018-07-18 MED ORDER — SODIUM CHLORIDE 0.9 % IV BOLUS
1000.0000 mL | Freq: Once | INTRAVENOUS | Status: AC
  Administered 2018-07-18: 1000 mL via INTRAVENOUS

## 2018-07-18 MED ORDER — SUCRALFATE 1 G PO TABS: 1.0000 g | ORAL_TABLET | Freq: Three times a day (TID) | ORAL | 0 refills | Status: DC

## 2018-07-18 MED ORDER — FOLIC ACID 5 MG/ML IJ SOLN
1.0000 mg | Freq: Every day | INTRAMUSCULAR | Status: DC
  Filled 2018-07-18: qty 0.2

## 2018-07-18 MED ORDER — THIAMINE HCL 100 MG/ML IJ SOLN
100.0000 mg | Freq: Once | INTRAMUSCULAR | Status: AC
  Administered 2018-07-18: 100 mg via INTRAVENOUS
  Filled 2018-07-18: qty 2

## 2018-07-18 MED ORDER — OMEPRAZOLE 20 MG PO CPDR: 20.0000 mg | DELAYED_RELEASE_CAPSULE | Freq: Two times a day (BID) | ORAL | 0 refills | Status: DC

## 2018-07-18 MED ORDER — FAMOTIDINE 20 MG IN NS 100 ML IVPB
20.0000 mg | Freq: Once | INTRAVENOUS | Status: AC
  Administered 2018-07-18: 20 mg via INTRAVENOUS
  Filled 2018-07-18: qty 100

## 2018-07-18 MED ORDER — ONDANSETRON 4 MG PO TBDP: 4.0000 mg | ORAL_TABLET | Freq: Three times a day (TID) | ORAL | 0 refills | Status: DC | PRN

## 2018-07-18 NOTE — ED Notes (Signed)
Pt discharged from ED; instructions provided and scripts given; Pt encouraged to return to ED if symptoms worsen and to f/u with PCP; Pt verbalized understanding of all instructions 

## 2018-07-22 ENCOUNTER — Encounter: Payer: Self-pay | Admitting: Internal Medicine

## 2018-07-22 ENCOUNTER — Ambulatory Visit: Payer: Self-pay

## 2018-08-08 ENCOUNTER — Emergency Department (HOSPITAL_COMMUNITY)
Admission: EM | Admit: 2018-08-08 | Discharge: 2018-08-09 | Disposition: A | Discharge status: Home / Self Care | Payer: Medicaid Other | Attending: Emergency Medicine | Admitting: Emergency Medicine

## 2018-08-08 ENCOUNTER — Encounter (HOSPITAL_COMMUNITY): Payer: Self-pay | Admitting: Emergency Medicine

## 2018-08-08 ENCOUNTER — Other Ambulatory Visit: Payer: Self-pay

## 2018-08-08 DIAGNOSIS — R0789 Other chest pain: Secondary | ICD-10-CM | POA: Insufficient documentation

## 2018-08-08 DIAGNOSIS — G8929 Other chronic pain: Secondary | ICD-10-CM

## 2018-08-08 DIAGNOSIS — I10 Essential (primary) hypertension: Secondary | ICD-10-CM | POA: Insufficient documentation

## 2018-08-08 DIAGNOSIS — F17210 Nicotine dependence, cigarettes, uncomplicated: Secondary | ICD-10-CM | POA: Insufficient documentation

## 2018-08-08 DIAGNOSIS — R109 Unspecified abdominal pain: Secondary | ICD-10-CM | POA: Diagnosis not present

## 2018-08-08 DIAGNOSIS — Z79899 Other long term (current) drug therapy: Secondary | ICD-10-CM | POA: Diagnosis not present

## 2018-08-08 NOTE — ED Triage Notes (Signed)
Pt c/o groin pain, nausea, L side pain going on 3-4 days. Denies sexual activity, frequent or painful urination. Reports 2 episodes of emesis after eating. Denies fall or other trauma to side or groin.

## 2018-08-09 ENCOUNTER — Other Ambulatory Visit: Payer: Self-pay

## 2018-08-09 ENCOUNTER — Emergency Department (HOSPITAL_COMMUNITY): Payer: Medicaid Other

## 2018-08-09 ENCOUNTER — Emergency Department (HOSPITAL_COMMUNITY)
Admission: EM | Admit: 2018-08-09 | Discharge: 2018-08-09 | Disposition: A | Discharge status: Home / Self Care | Payer: Medicaid Other | Source: Home / Self Care | Attending: Emergency Medicine | Admitting: Emergency Medicine

## 2018-08-09 ENCOUNTER — Encounter (HOSPITAL_COMMUNITY): Payer: Self-pay

## 2018-08-09 DIAGNOSIS — R0789 Other chest pain: Secondary | ICD-10-CM

## 2018-08-09 DIAGNOSIS — I10 Essential (primary) hypertension: Secondary | ICD-10-CM

## 2018-08-09 DIAGNOSIS — Z79899 Other long term (current) drug therapy: Secondary | ICD-10-CM

## 2018-08-09 DIAGNOSIS — F17210 Nicotine dependence, cigarettes, uncomplicated: Secondary | ICD-10-CM | POA: Insufficient documentation

## 2018-08-09 LAB — COMPREHENSIVE METABOLIC PANEL
ALT: 51 U/L — ABNORMAL HIGH (ref 0–44)
AST: 103 U/L — ABNORMAL HIGH (ref 15–41)
Albumin: 3.7 g/dL (ref 3.5–5.0)
Alkaline Phosphatase: 272 U/L — ABNORMAL HIGH (ref 38–126)
Anion gap: 16 — ABNORMAL HIGH (ref 5–15)
BUN: 12 mg/dL (ref 6–20)
CO2: 18 mmol/L — ABNORMAL LOW (ref 22–32)
Calcium: 9.2 mg/dL (ref 8.9–10.3)
Chloride: 105 mmol/L (ref 98–111)
Creatinine, Ser: 0.84 mg/dL (ref 0.61–1.24)
GFR calc Af Amer: >60 mL/min (ref >60)
GFR calc non Af Amer: >60 mL/min (ref >60)
Glucose, Bld: 87 mg/dL (ref 70–99)
Potassium: 4.2 mmol/L (ref 3.5–5.1)
Sodium: 139 mmol/L (ref 135–145)
Total Bilirubin: 0.5 mg/dL (ref 0.3–1.2)
Total Protein: 8.1 g/dL (ref 6.5–8.1)

## 2018-08-09 LAB — CBC WITH DIFFERENTIAL/PLATELET
Abs Immature Granulocytes: 0.02 10*3/uL (ref 0.00–0.07)
Basophils Absolute: 0 10*3/uL (ref 0.0–0.1)
Basophils Relative: 1 %
Eosinophils Absolute: 0.2 10*3/uL (ref 0.0–0.5)
Eosinophils Relative: 4 %
HCT: 38.1 % — ABNORMAL LOW (ref 39.0–52.0)
Hemoglobin: 11.9 g/dL — ABNORMAL LOW (ref 13.0–17.0)
Immature Granulocytes: 1 %
Lymphocytes Relative: 31 %
Lymphs Abs: 1.3 10*3/uL (ref 0.7–4.0)
MCH: 31.2 pg (ref 26.0–34.0)
MCHC: 31.2 g/dL (ref 30.0–36.0)
MCV: 100 fL (ref 80.0–100.0)
Monocytes Absolute: 0.7 10*3/uL (ref 0.1–1.0)
Monocytes Relative: 17 %
Neutro Abs: 1.9 10*3/uL (ref 1.7–7.7)
Neutrophils Relative %: 46 %
Platelets: 217 10*3/uL (ref 150–400)
RBC: 3.81 MIL/uL — ABNORMAL LOW (ref 4.22–5.81)
RDW: 16.8 % — ABNORMAL HIGH (ref 11.5–15.5)
WBC: 4.1 10*3/uL (ref 4.0–10.5)
nRBC: 0 % (ref 0.0–0.2)

## 2018-08-09 LAB — TROPONIN I: Troponin I: <0.03 ng/mL (ref <0.03)

## 2018-08-09 LAB — LIPASE, BLOOD: Lipase: 65 U/L — ABNORMAL HIGH (ref 11–51)

## 2018-08-09 NOTE — ED Notes (Signed)
Patient verbalizes understanding of discharge instructions. Opportunity for questioning and answers were provided. Armband removed by staff, pt discharged from ED ambulatory.   

## 2018-08-09 NOTE — Discharge Instructions (Signed)
Continue your home medications. Return to the ED for worsening symptoms, worsening shortness of breath, abdominal pain, blood in your stool.

## 2018-08-09 NOTE — ED Provider Notes (Signed)
ED ECG REPORT   Date: 08/09/2018  Rate: 102  Rhythm: sinus tachycardia  QRS Axis: normal  Intervals: normal  ST/T Wave abnormalities: nonspecific T wave changes  Conduction Disutrbances:none  Narrative Interpretation: LVH with nonspecific T wave changes, similar to priors  Old EKG Reviewed: unchanged  I have personally reviewed the EKG tracing and agree with the computerized printout as noted.    Sherwood Gambler, MD 08/09/18 [phone]
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
## 2018-08-09 NOTE — ED Triage Notes (Signed)
Pt c/o cp and abd pain x 3 days; endorses N/V, denies diarrhea; cp central, non radiating; endorses dizziness and sob; denies sick contacts; was evaluated yesterday for same, states "they didn't do nothing for me"

## 2018-09-02 ENCOUNTER — Telehealth: Payer: Self-pay | Admitting: Internal Medicine

## 2018-09-02 ENCOUNTER — Encounter: Payer: Self-pay | Admitting: Internal Medicine

## 2018-09-02 NOTE — Telephone Encounter (Signed)
No answer left pt a message

## 2018-09-24 ENCOUNTER — Emergency Department (HOSPITAL_COMMUNITY)
Admission: EM | Admit: 2018-09-24 | Discharge: 2018-09-25 | Disposition: A | Discharge status: Home / Self Care | Payer: Medicaid Other | Attending: Emergency Medicine | Admitting: Emergency Medicine

## 2018-09-24 ENCOUNTER — Encounter (HOSPITAL_COMMUNITY): Payer: Self-pay

## 2018-09-24 ENCOUNTER — Other Ambulatory Visit: Payer: Self-pay

## 2018-09-24 DIAGNOSIS — Z79899 Other long term (current) drug therapy: Secondary | ICD-10-CM | POA: Insufficient documentation

## 2018-09-24 DIAGNOSIS — G8929 Other chronic pain: Secondary | ICD-10-CM

## 2018-09-24 DIAGNOSIS — Z7982 Long term (current) use of aspirin: Secondary | ICD-10-CM | POA: Insufficient documentation

## 2018-09-24 DIAGNOSIS — R1013 Epigastric pain: Secondary | ICD-10-CM | POA: Diagnosis present

## 2018-09-24 DIAGNOSIS — I10 Essential (primary) hypertension: Secondary | ICD-10-CM | POA: Insufficient documentation

## 2018-09-24 DIAGNOSIS — R112 Nausea with vomiting, unspecified: Secondary | ICD-10-CM | POA: Diagnosis not present

## 2018-09-24 DIAGNOSIS — F17210 Nicotine dependence, cigarettes, uncomplicated: Secondary | ICD-10-CM | POA: Diagnosis not present

## 2018-09-24 LAB — CBC
HCT: 37.3 % — ABNORMAL LOW (ref 39.0–52.0)
Hemoglobin: 12.5 g/dL — ABNORMAL LOW (ref 13.0–17.0)
MCH: 32.1 pg (ref 26.0–34.0)
MCHC: 33.5 g/dL (ref 30.0–36.0)
MCV: 95.9 fL (ref 80.0–100.0)
Platelets: 237 10*3/uL (ref 150–400)
RBC: 3.89 MIL/uL — ABNORMAL LOW (ref 4.22–5.81)
RDW: 15.2 % (ref 11.5–15.5)
WBC: 5.6 10*3/uL (ref 4.0–10.5)
nRBC: 0 % (ref 0.0–0.2)

## 2018-09-24 LAB — LIPASE, BLOOD: Lipase: 41 U/L (ref 11–51)

## 2018-09-24 LAB — URINALYSIS, ROUTINE W REFLEX MICROSCOPIC
Bilirubin Urine: NEGATIVE
Glucose, UA: NEGATIVE mg/dL
Hgb urine dipstick: NEGATIVE
Ketones, ur: NEGATIVE mg/dL
Leukocytes,Ua: NEGATIVE
Nitrite: NEGATIVE
Protein, ur: NEGATIVE mg/dL
Specific Gravity, Urine: 1.002 — ABNORMAL LOW (ref 1.005–1.030)
pH: 6 (ref 5.0–8.0)

## 2018-09-24 LAB — COMPREHENSIVE METABOLIC PANEL
ALT: 51 U/L — ABNORMAL HIGH (ref 0–44)
AST: 100 U/L — ABNORMAL HIGH (ref 15–41)
Albumin: 3.6 g/dL (ref 3.5–5.0)
Alkaline Phosphatase: 267 U/L — ABNORMAL HIGH (ref 38–126)
Anion gap: 10 (ref 5–15)
BUN: 10 mg/dL (ref 6–20)
CO2: 20 mmol/L — ABNORMAL LOW (ref 22–32)
Calcium: 9.2 mg/dL (ref 8.9–10.3)
Chloride: 104 mmol/L (ref 98–111)
Creatinine, Ser: 0.9 mg/dL (ref 0.61–1.24)
GFR calc Af Amer: >60 mL/min (ref >60)
GFR calc non Af Amer: >60 mL/min (ref >60)
Glucose, Bld: 115 mg/dL — ABNORMAL HIGH (ref 70–99)
Potassium: 3.2 mmol/L — ABNORMAL LOW (ref 3.5–5.1)
Sodium: 134 mmol/L — ABNORMAL LOW (ref 135–145)
Total Bilirubin: 1.1 mg/dL (ref 0.3–1.2)
Total Protein: 8.2 g/dL — ABNORMAL HIGH (ref 6.5–8.1)

## 2018-09-24 NOTE — ED Triage Notes (Signed)
Pt BIB [facility] for eval of abd pain which is consistent with his past visits for same. Pt is ETOH intox, steady gait. Intermittently agitated in triage.

## 2018-09-25 ENCOUNTER — Other Ambulatory Visit: Payer: Self-pay | Admitting: Internal Medicine

## 2018-09-25 MED ORDER — SUCRALFATE 1 GM/10ML PO SUSP
1.0000 g | Freq: Once | ORAL | Status: AC
  Administered 2018-09-25: 1 g via ORAL
  Filled 2018-09-25: qty 10

## 2018-09-25 MED ORDER — LIDOCAINE VISCOUS HCL 2 % MT SOLN
15.0000 mL | Freq: Once | OROMUCOSAL | Status: AC
  Administered 2018-09-25: 15 mL via ORAL
  Filled 2018-09-25: qty 15

## 2018-09-25 MED ORDER — ALUM & MAG HYDROXIDE-SIMETH 200-200-20 MG/5ML PO SUSP
30.0000 mL | Freq: Once | ORAL | Status: AC
  Administered 2018-09-25: 04:00:00 30 mL via ORAL
  Filled 2018-09-25: qty 30

## 2018-09-25 MED ORDER — SUCRALFATE 1 G PO TABS: 1.0000 g | ORAL_TABLET | Freq: Three times a day (TID) | ORAL | 0 refills | Status: DC

## 2018-09-25 NOTE — ED Notes (Signed)
Pt given sprite and ice

## 2018-09-25 NOTE — ED Notes (Signed)
Patient verbalizes understanding of discharge instructions. Opportunity for questioning and answers were provided. Armband removed by staff, pt discharged from ED ambulatory.   

## 2018-09-25 NOTE — ED Notes (Signed)
Pt given 2 warm blankets. Pt in [place] eating snacks from his bag.
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
## 2018-09-29 ENCOUNTER — Emergency Department (HOSPITAL_COMMUNITY): Payer: Medicaid Other

## 2018-09-29 ENCOUNTER — Other Ambulatory Visit: Payer: Self-pay

## 2018-09-29 ENCOUNTER — Encounter (HOSPITAL_COMMUNITY): Payer: Self-pay | Admitting: Emergency Medicine

## 2018-09-29 ENCOUNTER — Emergency Department (HOSPITAL_COMMUNITY)
Admission: EM | Admit: 2018-09-29 | Discharge: 2018-09-29 | Disposition: A | Discharge status: Home / Self Care | Payer: Medicaid Other | Attending: Emergency Medicine | Admitting: Emergency Medicine

## 2018-09-29 DIAGNOSIS — F17210 Nicotine dependence, cigarettes, uncomplicated: Secondary | ICD-10-CM | POA: Insufficient documentation

## 2018-09-29 DIAGNOSIS — I10 Essential (primary) hypertension: Secondary | ICD-10-CM | POA: Insufficient documentation

## 2018-09-29 DIAGNOSIS — Z590 Homelessness: Secondary | ICD-10-CM | POA: Insufficient documentation

## 2018-09-29 DIAGNOSIS — Z79899 Other long term (current) drug therapy: Secondary | ICD-10-CM | POA: Insufficient documentation

## 2018-09-29 DIAGNOSIS — Z7982 Long term (current) use of aspirin: Secondary | ICD-10-CM | POA: Insufficient documentation

## 2018-09-29 DIAGNOSIS — R079 Chest pain, unspecified: Secondary | ICD-10-CM | POA: Diagnosis present

## 2018-09-29 LAB — CBC
HCT: 37.5 % — ABNORMAL LOW (ref 39.0–52.0)
Hemoglobin: 12.6 g/dL — ABNORMAL LOW (ref 13.0–17.0)
MCH: 31.9 pg (ref 26.0–34.0)
MCHC: 33.6 g/dL (ref 30.0–36.0)
MCV: 94.9 fL (ref 80.0–100.0)
Platelets: 221 10*3/uL (ref 150–400)
RBC: 3.95 MIL/uL — ABNORMAL LOW (ref 4.22–5.81)
RDW: 15.3 % (ref 11.5–15.5)
WBC: 5.7 10*3/uL (ref 4.0–10.5)
nRBC: 0 % (ref 0.0–0.2)

## 2018-09-29 LAB — BASIC METABOLIC PANEL
Anion gap: 19 — ABNORMAL HIGH (ref 5–15)
BUN: 12 mg/dL (ref 6–20)
CO2: 20 mmol/L — ABNORMAL LOW (ref 22–32)
Calcium: 9.5 mg/dL (ref 8.9–10.3)
Chloride: 95 mmol/L — ABNORMAL LOW (ref 98–111)
Creatinine, Ser: 0.88 mg/dL (ref 0.61–1.24)
GFR calc Af Amer: >60 mL/min (ref >60)
GFR calc non Af Amer: >60 mL/min (ref >60)
Glucose, Bld: 87 mg/dL (ref 70–99)
Potassium: 4 mmol/L (ref 3.5–5.1)
Sodium: 134 mmol/L — ABNORMAL LOW (ref 135–145)

## 2018-09-29 LAB — TROPONIN I
Troponin I: <0.03 ng/mL (ref <0.03)
Troponin I: <0.03 ng/mL (ref <0.03)

## 2018-09-29 LAB — MAGNESIUM: Magnesium: 1.5 mg/dL — ABNORMAL LOW (ref 1.7–2.4)

## 2018-09-29 MED ORDER — THIAMINE HCL 100 MG PO TABS: 100.0000 mg | ORAL_TABLET | Freq: Every day | ORAL | 0 refills | Status: DC

## 2018-09-29 MED ORDER — VITAMIN B-1 100 MG PO TABS
100.0000 mg | ORAL_TABLET | Freq: Every day | ORAL | Status: DC
  Administered 2018-09-29: 100 mg via ORAL
  Filled 2018-09-29: qty 1

## 2018-09-29 MED ORDER — FOLIC ACID 1 MG PO TABS: 1.0000 mg | ORAL_TABLET | Freq: Every day | ORAL | 0 refills | Status: DC

## 2018-09-29 MED ORDER — FAMOTIDINE 20 MG PO TABS: 20.0000 mg | ORAL_TABLET | Freq: Two times a day (BID) | ORAL | 0 refills | Status: DC

## 2018-09-29 MED ORDER — ACETAMINOPHEN 325 MG PO TABS
650.0000 mg | ORAL_TABLET | Freq: Once | ORAL | Status: AC
  Administered 2018-09-29: 11:00:00 650 mg via ORAL
  Filled 2018-09-29: qty 2

## 2018-09-29 MED ORDER — ALUM & MAG HYDROXIDE-SIMETH 200-200-20 MG/5ML PO SUSP
30.0000 mL | Freq: Once | ORAL | Status: AC
  Administered 2018-09-29: 11:00:00 30 mL via ORAL
  Filled 2018-09-29: qty 30

## 2018-09-29 MED ORDER — OMEPRAZOLE 20 MG PO CPDR: 20.0000 mg | DELAYED_RELEASE_CAPSULE | Freq: Two times a day (BID) | ORAL | 0 refills | Status: DC

## 2018-09-29 MED ORDER — SUCRALFATE 1 G PO TABS: 1.0000 g | ORAL_TABLET | Freq: Three times a day (TID) | ORAL | 0 refills | Status: DC

## 2018-09-29 MED ORDER — LIDOCAINE VISCOUS HCL 2 % MT SOLN
15.0000 mL | Freq: Once | OROMUCOSAL | Status: AC
  Administered 2018-09-29: 15 mL via ORAL
  Filled 2018-09-29: qty 15

## 2018-09-29 MED ORDER — PANTOPRAZOLE SODIUM 40 MG PO TBEC: 40.0000 mg | DELAYED_RELEASE_TABLET | Freq: Every day | ORAL | 0 refills | Status: DC

## 2018-09-29 MED ORDER — METOPROLOL SUCCINATE ER 50 MG PO TB24: 150.0000 mg | ORAL_TABLET | Freq: Every day | ORAL | 0 refills | Status: DC

## 2018-09-29 MED ORDER — ONDANSETRON 4 MG PO TBDP
8.0000 mg | ORAL_TABLET | Freq: Once | ORAL | Status: AC
  Administered 2018-09-29: 8 mg via ORAL
  Filled 2018-09-29: qty 2

## 2018-09-29 MED ORDER — ASPIRIN 81 MG PO CHEW
324.0000 mg | CHEWABLE_TABLET | Freq: Once | ORAL | Status: AC
  Administered 2018-09-29: 11:00:00 324 mg via ORAL
  Filled 2018-09-29: qty 4

## 2018-09-29 MED ORDER — MAGNESIUM SULFATE 2 GM/50ML IV SOLN
2.0000 g | Freq: Once | INTRAVENOUS | Status: AC
  Administered 2018-09-29: 2 g via INTRAVENOUS
  Filled 2018-09-29: qty 50

## 2018-09-29 MED ORDER — ASPIRIN EC 81 MG PO TBEC: 81.0000 mg | DELAYED_RELEASE_TABLET | Freq: Every day | ORAL | 0 refills | Status: DC

## 2018-09-29 MED ORDER — SODIUM CHLORIDE 0.9 % IV BOLUS
500.0000 mL | Freq: Once | INTRAVENOUS | Status: AC
  Administered 2018-09-29: 12:00:00 500 mL via INTRAVENOUS

## 2018-09-29 NOTE — Care Management (Signed)
ED CM consulted patient is homeless and has run out of medications. Patient [name] Medicaid is active, discussed with EDP e-cribing prescriptions to [facility] on [address], and CM will arrange to have medications picked up and handed to patient prior to DC from the ED. Noted that patient is active with [facility].  CM instructed patient to go to the [facility] tomorrow morning  to provide assistance with  homeless resorces.  Updated [place] RN. No further ED CM needs identified.

## 2018-09-29 NOTE — ED Provider Notes (Signed)
2nd troponin is negative. Will d/c per prior plan with Dr. Baxter Kail, MD 09/29/18 [id number]

## 2018-09-29 NOTE — ED Triage Notes (Signed)
Pt arrives vis EMS with reports of CP, abd pain and back pain. Endorses N/V. Hx of cirrhosis. Not taking medications. 324 ASA, 4 mg zofran, 1 nitro given with no relief.
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
## 2018-09-29 NOTE — ED Notes (Signed)
Nurse navigator spoke with patient resting comfortably and does not want nurse to contact family at this time.

## 2018-09-29 NOTE — Discharge Instructions (Addendum)
We saw you in the ER for the chest pain/shortness of breath. All of our cardiac workup is normal, including labs, EKG and chest X-RAY are normal. We are not sure what is causing your discomfort, but we feel comfortable sending you home at this time. The workup in the ER is not complete, and you should follow up with your primary care doctor for further evaluation.  Please return to the ER if you have worsening chest pain, shortness of breath, pain radiating to your jaw, shoulder, or back, sweats or fainting. Otherwise see the Cardiologist or your primary care doctor as requested.   Please go to [facility] for shelter resources.

## 2018-09-29 NOTE — Progress Notes (Signed)
CSW received consult for patient for homelessness issues. CSW is familiar with patientnt regarding a prior ED visit in February. CSW inquired on patient's prior plan to sign a lease in March and noted per patient report his housing plan fell through. CSW noted patient's report of running out of SSI and not being able to afford a motel. CSW discussed long-term housing supports and identified a goal for the patient to reach out to [place] for housing assistance. CSW provided patient contact information to follow up with them. Additionally, patient provided homelessness resources and encouraged patient to reach out to the service providers for housing support. CSW signing off at this time please re-consult for future social work needs.  Lamonte Richer, LCSW, [place] Worker II [phone]

## 2018-09-29 NOTE — ED Notes (Signed)
Pt given coke and tolerated well.

## 2018-10-09 ENCOUNTER — Encounter (HOSPITAL_COMMUNITY): Payer: Self-pay

## 2018-10-09 ENCOUNTER — Emergency Department (HOSPITAL_COMMUNITY)
Admission: EM | Admit: 2018-10-09 | Discharge: 2018-10-10 | Disposition: A | Discharge status: Home / Self Care | Payer: Medicaid Other | Attending: Emergency Medicine | Admitting: Emergency Medicine

## 2018-10-09 ENCOUNTER — Other Ambulatory Visit: Payer: Self-pay

## 2018-10-09 DIAGNOSIS — I10 Essential (primary) hypertension: Secondary | ICD-10-CM | POA: Insufficient documentation

## 2018-10-09 DIAGNOSIS — Z7982 Long term (current) use of aspirin: Secondary | ICD-10-CM | POA: Insufficient documentation

## 2018-10-09 DIAGNOSIS — Z79899 Other long term (current) drug therapy: Secondary | ICD-10-CM | POA: Diagnosis not present

## 2018-10-09 DIAGNOSIS — F17210 Nicotine dependence, cigarettes, uncomplicated: Secondary | ICD-10-CM | POA: Insufficient documentation

## 2018-10-09 DIAGNOSIS — K2920 Alcoholic gastritis without bleeding: Secondary | ICD-10-CM | POA: Insufficient documentation

## 2018-10-09 DIAGNOSIS — F1210 Cannabis abuse, uncomplicated: Secondary | ICD-10-CM | POA: Insufficient documentation

## 2018-10-09 DIAGNOSIS — I252 Old myocardial infarction: Secondary | ICD-10-CM | POA: Insufficient documentation

## 2018-10-09 DIAGNOSIS — R1013 Epigastric pain: Secondary | ICD-10-CM | POA: Insufficient documentation

## 2018-10-09 MED ORDER — SODIUM CHLORIDE 0.9% FLUSH: 3.0000 mL | Freq: Once | INTRAVENOUS | Status: DC

## 2018-10-09 NOTE — ED Triage Notes (Signed)
Pt bib ems for abd pain, ETOH use today. Pt alert, nad noted

## 2018-10-09 NOTE — ED Notes (Signed)
Pt refused blood draw

## 2018-10-10 LAB — COMPREHENSIVE METABOLIC PANEL
ALT: 50 U/L — ABNORMAL HIGH (ref 0–44)
AST: 82 U/L — ABNORMAL HIGH (ref 15–41)
Albumin: 3.4 g/dL — ABNORMAL LOW (ref 3.5–5.0)
Alkaline Phosphatase: 246 U/L — ABNORMAL HIGH (ref 38–126)
Anion gap: 9 (ref 5–15)
BUN: 15 mg/dL (ref 6–20)
CO2: 20 mmol/L — ABNORMAL LOW (ref 22–32)
Calcium: 8.6 mg/dL — ABNORMAL LOW (ref 8.9–10.3)
Chloride: 106 mmol/L (ref 98–111)
Creatinine, Ser: 0.93 mg/dL (ref 0.61–1.24)
GFR calc Af Amer: >60 mL/min (ref >60)
GFR calc non Af Amer: >60 mL/min (ref >60)
Glucose, Bld: 88 mg/dL (ref 70–99)
Potassium: 3.8 mmol/L (ref 3.5–5.1)
Sodium: 135 mmol/L (ref 135–145)
Total Bilirubin: 1 mg/dL (ref 0.3–1.2)
Total Protein: 7.2 g/dL (ref 6.5–8.1)

## 2018-10-10 LAB — URINALYSIS, ROUTINE W REFLEX MICROSCOPIC
Bilirubin Urine: NEGATIVE
Glucose, UA: NEGATIVE mg/dL
Hgb urine dipstick: NEGATIVE
Ketones, ur: NEGATIVE mg/dL
Leukocytes,Ua: NEGATIVE
Nitrite: NEGATIVE
Protein, ur: NEGATIVE mg/dL
Specific Gravity, Urine: 1.018 (ref 1.005–1.030)
pH: 5 (ref 5.0–8.0)

## 2018-10-10 LAB — CBC
HCT: 37 % — ABNORMAL LOW (ref 39.0–52.0)
Hemoglobin: 12.3 g/dL — ABNORMAL LOW (ref 13.0–17.0)
MCH: 32.1 pg (ref 26.0–34.0)
MCHC: 33.2 g/dL (ref 30.0–36.0)
MCV: 96.6 fL (ref 80.0–100.0)
Platelets: 234 10*3/uL (ref 150–400)
RBC: 3.83 MIL/uL — ABNORMAL LOW (ref 4.22–5.81)
RDW: 16.1 % — ABNORMAL HIGH (ref 11.5–15.5)
WBC: 5.2 10*3/uL (ref 4.0–10.5)
nRBC: 0 % (ref 0.0–0.2)

## 2018-10-10 LAB — LIPASE, BLOOD: Lipase: 34 U/L (ref 11–51)

## 2018-10-10 MED ORDER — KETOROLAC TROMETHAMINE 60 MG/2ML IM SOLN: 60.0000 mg | Freq: Once | INTRAMUSCULAR | Status: DC

## 2018-10-10 MED ORDER — ALUM & MAG HYDROXIDE-SIMETH 200-200-20 MG/5ML PO SUSP
30.0000 mL | Freq: Once | ORAL | Status: AC
  Administered 2018-10-10: 30 mL via ORAL
  Filled 2018-10-10: qty 30

## 2018-10-10 MED ORDER — LIDOCAINE VISCOUS HCL 2 % MT SOLN
15.0000 mL | Freq: Once | OROMUCOSAL | Status: AC
  Administered 2018-10-10: 15 mL via ORAL
  Filled 2018-10-10: qty 15

## 2018-10-10 MED ORDER — KETOROLAC TROMETHAMINE 60 MG/2ML IM SOLN
60.0000 mg | Freq: Once | INTRAMUSCULAR | Status: AC
  Administered 2018-10-10: 60 mg via INTRAMUSCULAR
  Filled 2018-10-10: qty 2

## 2018-10-10 NOTE — ED Notes (Signed)
Pt asleep. No signs of distress noted

## 2018-10-10 NOTE — ED Notes (Signed)
Patient verbalizes understanding of discharge instructions. Opportunity for questioning and answers were provided. Armband removed by staff, pt discharged from ED ambulatory.   

## 2018-10-10 NOTE — ED Notes (Signed)
E-signature not available, verbalized understanding of DC instructions and follow up care.

## 2018-10-10 NOTE — Discharge Instructions (Addendum)
Continue taking Prilosec twice daily as prescribed.  Follow-up with gastroenterology if you are not improving in the next few days.  The contact information for [facility] has been provided in this discharge summary for you to call and make these arrangements.
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
## 2018-10-19 ENCOUNTER — Other Ambulatory Visit: Payer: Self-pay

## 2018-10-19 ENCOUNTER — Emergency Department (HOSPITAL_COMMUNITY): Payer: Medicaid Other

## 2018-10-19 ENCOUNTER — Encounter (HOSPITAL_COMMUNITY): Payer: Self-pay | Admitting: *Deleted

## 2018-10-19 ENCOUNTER — Emergency Department (HOSPITAL_COMMUNITY)
Admission: EM | Admit: 2018-10-19 | Discharge: 2018-10-19 | Disposition: A | Discharge status: Home / Self Care | Payer: Medicaid Other | Attending: Emergency Medicine | Admitting: Emergency Medicine

## 2018-10-19 DIAGNOSIS — F17210 Nicotine dependence, cigarettes, uncomplicated: Secondary | ICD-10-CM | POA: Diagnosis not present

## 2018-10-19 DIAGNOSIS — I10 Essential (primary) hypertension: Secondary | ICD-10-CM | POA: Diagnosis not present

## 2018-10-19 DIAGNOSIS — Z7982 Long term (current) use of aspirin: Secondary | ICD-10-CM | POA: Diagnosis not present

## 2018-10-19 DIAGNOSIS — Z79899 Other long term (current) drug therapy: Secondary | ICD-10-CM | POA: Diagnosis not present

## 2018-10-19 DIAGNOSIS — I252 Old myocardial infarction: Secondary | ICD-10-CM | POA: Insufficient documentation

## 2018-10-19 DIAGNOSIS — R0789 Other chest pain: Secondary | ICD-10-CM | POA: Diagnosis not present

## 2018-10-19 DIAGNOSIS — R072 Precordial pain: Secondary | ICD-10-CM | POA: Diagnosis present

## 2018-10-19 LAB — BASIC METABOLIC PANEL
Anion gap: 14 (ref 5–15)
BUN: 9 mg/dL (ref 6–20)
CO2: 21 mmol/L — ABNORMAL LOW (ref 22–32)
Calcium: 9 mg/dL (ref 8.9–10.3)
Chloride: 102 mmol/L (ref 98–111)
Creatinine, Ser: 0.92 mg/dL (ref 0.61–1.24)
GFR calc Af Amer: >60 mL/min (ref >60)
GFR calc non Af Amer: >60 mL/min (ref >60)
Glucose, Bld: 89 mg/dL (ref 70–99)
Potassium: 3.4 mmol/L — ABNORMAL LOW (ref 3.5–5.1)
Sodium: 137 mmol/L (ref 135–145)

## 2018-10-19 LAB — LIPASE, BLOOD: Lipase: 43 U/L (ref 11–51)

## 2018-10-19 LAB — URINALYSIS, ROUTINE W REFLEX MICROSCOPIC
Bilirubin Urine: NEGATIVE
Glucose, UA: NEGATIVE mg/dL
Hgb urine dipstick: NEGATIVE
Ketones, ur: NEGATIVE mg/dL
Leukocytes,Ua: NEGATIVE
Nitrite: NEGATIVE
Protein, ur: NEGATIVE mg/dL
Specific Gravity, Urine: 1.003 — ABNORMAL LOW (ref 1.005–1.030)
pH: 6 (ref 5.0–8.0)

## 2018-10-19 LAB — CBC
HCT: 34.5 % — ABNORMAL LOW (ref 39.0–52.0)
Hemoglobin: 11.5 g/dL — ABNORMAL LOW (ref 13.0–17.0)
MCH: 32 pg (ref 26.0–34.0)
MCHC: 33.3 g/dL (ref 30.0–36.0)
MCV: 96.1 fL (ref 80.0–100.0)
Platelets: 200 10*3/uL (ref 150–400)
RBC: 3.59 MIL/uL — ABNORMAL LOW (ref 4.22–5.81)
RDW: 15.9 % — ABNORMAL HIGH (ref 11.5–15.5)
WBC: 4.8 10*3/uL (ref 4.0–10.5)
nRBC: 0 % (ref 0.0–0.2)

## 2018-10-19 LAB — HEPATIC FUNCTION PANEL
ALT: 60 U/L — ABNORMAL HIGH (ref 0–44)
AST: 136 U/L — ABNORMAL HIGH (ref 15–41)
Albumin: 3.2 g/dL — ABNORMAL LOW (ref 3.5–5.0)
Alkaline Phosphatase: 296 U/L — ABNORMAL HIGH (ref 38–126)
Bilirubin, Direct: 0.3 mg/dL — ABNORMAL HIGH (ref 0.0–0.2)
Indirect Bilirubin: 0.8 mg/dL (ref 0.3–0.9)
Total Bilirubin: 1.1 mg/dL (ref 0.3–1.2)
Total Protein: 7.2 g/dL (ref 6.5–8.1)

## 2018-10-19 LAB — TROPONIN I: Troponin I: <0.03 ng/mL (ref <0.03)

## 2018-10-19 MED ORDER — SODIUM CHLORIDE 0.9% FLUSH: 3.0000 mL | Freq: Once | INTRAVENOUS | Status: DC

## 2018-10-19 NOTE — ED Notes (Signed)
Discharge instructions discussed with pt. Pt verbalized understanding.   Signature pad not available

## 2018-10-19 NOTE — ED Notes (Signed)
ED Provider at bedside. 

## 2018-10-19 NOTE — ED Provider Notes (Signed)
I have personally seen and examined the patient. I have reviewed the documentation on PMH/FH/Soc Hx. I have discussed the plan of care with the resident and patient.  I have reviewed and agree with the resident's documentation. Please see associated encounter note.  Briefly, the patient is a 58 y.o. male here with burning chest pain.  Patient with normal vitals.  Patient states that he has had pain for weeks to months.  States that he is concerned about having lung cancer.  Has history of alcoholic gastritis. Had some pain in the middle of his chest into his stomach.  Feels like a burning sensation.  Denies any pressure, diaphoresis.  Patient mostly asking me for food.  Overall is well-appearing.  Clear breath sounds.  EKG shows sinus rhythm.  No signs of ischemic changes.  No change from prior.  Troponin normal.  Overall atypical story for ACS.  Does not have persistent chest pain.  No diaphoresis.  Suspect likely GI in nature.  Patient denies any shortness of breath, no concern for PE.  No significant electrolyte abnormality, kidney injury.  Patient feels better after eating food.  Liver enzymes are elevated but are baseline for the patient.  Has history of cirrhosis.  Chest x-ray showed no signs of pneumonia, no pneumothorax, no pleural effusion.  No concerning features for lung cancer.  Patient overall given reassurance.  Discharged from ED in good condition.  Given return precautions.  Recommend follow-up with primary care doctor.   EKG Interpretation  Date/Time:  Saturday October 19 2018 18:33:28 EDT Ventricular Rate:  90 PR Interval:    QRS Duration: 102 QT Interval:  364 QTC Calculation: 446 R Axis:   11 Text Interpretation:  Sinus rhythm Left atrial enlargement Nonspecific T abnrm, anterolateral leads Minimal ST elevation, anterior leads Confirmed by Lennice Sites [phone]) on 10/19/2018 6:44:28 PM         Lennice Sites, DO 10/19/18 2026

## 2018-10-19 NOTE — ED Notes (Signed)
Pt. Comfortable eating meal. Pt. Denies pain.

## 2018-10-19 NOTE — Discharge Instructions (Addendum)
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
## 2018-10-19 NOTE — ED Notes (Signed)
Patient transported to x-ray. ?

## 2018-10-19 NOTE — ED Triage Notes (Signed)
PR ems PT FIRST reported he had ABD  Pain and changed his complaint to CP . Pt refused to give EMS information on CP Sx's . Pt refused ASA  And Nitro not given because SBP 106.

## 2018-10-21 ENCOUNTER — Encounter (HOSPITAL_COMMUNITY): Payer: Self-pay | Admitting: Emergency Medicine

## 2018-10-21 ENCOUNTER — Other Ambulatory Visit: Payer: Self-pay

## 2018-10-21 ENCOUNTER — Emergency Department (HOSPITAL_COMMUNITY)
Admission: EM | Admit: 2018-10-21 | Discharge: 2018-10-21 | Disposition: A | Discharge status: Home / Self Care | Payer: Medicaid Other | Attending: Emergency Medicine | Admitting: Emergency Medicine

## 2018-10-21 DIAGNOSIS — R109 Unspecified abdominal pain: Secondary | ICD-10-CM | POA: Insufficient documentation

## 2018-10-21 DIAGNOSIS — Z5321 Procedure and treatment not carried out due to patient leaving prior to being seen by health care provider: Secondary | ICD-10-CM | POA: Insufficient documentation

## 2018-10-21 MED ORDER — SODIUM CHLORIDE 0.9% FLUSH: 3.0000 mL | Freq: Once | INTRAVENOUS | Status: DC

## 2018-10-21 NOTE — ED Notes (Signed)
Patient name called twice with no answer.

## 2018-10-21 NOTE — ED Triage Notes (Addendum)
Pt arrives via [facility] from under a tree in a parking lot with c/o abd pain. EMS reports patient will not provide any other information about his symptoms. Pt seen here a few days ago. Pt a/ox4, uncooperative in triage, will not provide any information to staff when asked why he is here.

## 2018-10-26 ENCOUNTER — Emergency Department (HOSPITAL_COMMUNITY)
Admission: EM | Admit: 2018-10-26 | Discharge: 2018-10-26 | Disposition: A | Discharge status: Home / Self Care | Payer: Medicaid Other | Attending: Emergency Medicine | Admitting: Emergency Medicine

## 2018-10-26 ENCOUNTER — Emergency Department (HOSPITAL_COMMUNITY): Payer: Medicaid Other

## 2018-10-26 ENCOUNTER — Other Ambulatory Visit: Payer: Self-pay

## 2018-10-26 DIAGNOSIS — R1010 Upper abdominal pain, unspecified: Secondary | ICD-10-CM | POA: Diagnosis not present

## 2018-10-26 DIAGNOSIS — R748 Abnormal levels of other serum enzymes: Secondary | ICD-10-CM | POA: Insufficient documentation

## 2018-10-26 DIAGNOSIS — R0789 Other chest pain: Secondary | ICD-10-CM | POA: Insufficient documentation

## 2018-10-26 DIAGNOSIS — R079 Chest pain, unspecified: Secondary | ICD-10-CM | POA: Diagnosis present

## 2018-10-26 DIAGNOSIS — Z79899 Other long term (current) drug therapy: Secondary | ICD-10-CM | POA: Insufficient documentation

## 2018-10-26 DIAGNOSIS — R7989 Other specified abnormal findings of blood chemistry: Secondary | ICD-10-CM

## 2018-10-26 DIAGNOSIS — F17210 Nicotine dependence, cigarettes, uncomplicated: Secondary | ICD-10-CM | POA: Diagnosis not present

## 2018-10-26 DIAGNOSIS — I10 Essential (primary) hypertension: Secondary | ICD-10-CM | POA: Diagnosis not present

## 2018-10-26 DIAGNOSIS — Z7982 Long term (current) use of aspirin: Secondary | ICD-10-CM | POA: Insufficient documentation

## 2018-10-26 LAB — COMPREHENSIVE METABOLIC PANEL
ALT: 54 U/L — ABNORMAL HIGH (ref 0–44)
AST: 149 U/L — ABNORMAL HIGH (ref 15–41)
Albumin: 3.5 g/dL (ref 3.5–5.0)
Alkaline Phosphatase: 297 U/L — ABNORMAL HIGH (ref 38–126)
Anion gap: 12 (ref 5–15)
BUN: 11 mg/dL (ref 6–20)
CO2: 20 mmol/L — ABNORMAL LOW (ref 22–32)
Calcium: 9.3 mg/dL (ref 8.9–10.3)
Chloride: 102 mmol/L (ref 98–111)
Creatinine, Ser: 0.94 mg/dL (ref 0.61–1.24)
GFR calc Af Amer: >60 mL/min (ref >60)
GFR calc non Af Amer: >60 mL/min (ref >60)
Glucose, Bld: 109 mg/dL — ABNORMAL HIGH (ref 70–99)
Potassium: 4.2 mmol/L (ref 3.5–5.1)
Sodium: 134 mmol/L — ABNORMAL LOW (ref 135–145)
Total Bilirubin: 0.9 mg/dL (ref 0.3–1.2)
Total Protein: 7.7 g/dL (ref 6.5–8.1)

## 2018-10-26 LAB — CBC
HCT: 35.2 % — ABNORMAL LOW (ref 39.0–52.0)
Hemoglobin: 11.6 g/dL — ABNORMAL LOW (ref 13.0–17.0)
MCH: 32.5 pg (ref 26.0–34.0)
MCHC: 33 g/dL (ref 30.0–36.0)
MCV: 98.6 fL (ref 80.0–100.0)
Platelets: 165 10*3/uL (ref 150–400)
RBC: 3.57 MIL/uL — ABNORMAL LOW (ref 4.22–5.81)
RDW: 16.3 % — ABNORMAL HIGH (ref 11.5–15.5)
WBC: 3.5 10*3/uL — ABNORMAL LOW (ref 4.0–10.5)
nRBC: 0 % (ref 0.0–0.2)

## 2018-10-26 LAB — ETHANOL: Alcohol, Ethyl (B): <10 mg/dL (ref <10)

## 2018-10-26 LAB — TROPONIN I (HIGH SENSITIVITY)
Troponin I (High Sensitivity): 21 ng/L — ABNORMAL HIGH (ref <18)
Troponin I (High Sensitivity): 23 ng/L — ABNORMAL HIGH (ref <18)

## 2018-10-26 LAB — LIPASE, BLOOD: Lipase: 53 U/L — ABNORMAL HIGH (ref 11–51)

## 2018-10-26 MED ORDER — ALUM & MAG HYDROXIDE-SIMETH 200-200-20 MG/5ML PO SUSP
15.0000 mL | Freq: Once | ORAL | Status: AC
  Administered 2018-10-26: 15 mL via ORAL
  Filled 2018-10-26: qty 30

## 2018-10-26 MED ORDER — OXYCODONE-ACETAMINOPHEN 5-325 MG PO TABS
1.0000 | ORAL_TABLET | Freq: Once | ORAL | Status: AC
  Administered 2018-10-26: 1 via ORAL
  Filled 2018-10-26: qty 1

## 2018-10-26 MED ORDER — ONDANSETRON 4 MG PO TBDP
8.0000 mg | ORAL_TABLET | Freq: Once | ORAL | Status: AC
  Administered 2018-10-26: 8 mg via ORAL
  Filled 2018-10-26: qty 2

## 2018-10-26 MED ORDER — HYDROCODONE-ACETAMINOPHEN 5-325 MG PO TABS
1.0000 | ORAL_TABLET | Freq: Once | ORAL | Status: AC
  Administered 2018-10-26: 1 via ORAL
  Filled 2018-10-26: qty 1

## 2018-10-26 MED ORDER — PANTOPRAZOLE SODIUM 40 MG PO TBEC
40.0000 mg | DELAYED_RELEASE_TABLET | Freq: Once | ORAL | Status: AC
  Administered 2018-10-26: 40 mg via ORAL
  Filled 2018-10-26: qty 1

## 2018-10-26 MED ORDER — FAMOTIDINE 20 MG PO TABS
20.0000 mg | ORAL_TABLET | Freq: Once | ORAL | Status: AC
  Administered 2018-10-26: 20 mg via ORAL
  Filled 2018-10-26: qty 1
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
## 2018-10-26 NOTE — ED Notes (Signed)
Patient given [place] sandwich and coke.

## 2018-10-26 NOTE — ED Notes (Signed)
Patient verbalizes understanding of discharge instructions. Opportunity for questioning and answers were provided. Armband removed by staff, pt discharged from ED.  

## 2018-10-26 NOTE — Discharge Instructions (Addendum)
It was our pleasure to provide your ER care today - we hope that you feel better.  Rest. Drink plenty of fluids. Continue your acid blocker medication.  Avoid alcohol use.   Follow up with primary care doctor in the coming week - call office to arrange appointment.  Some of your liver function tests are elevated, and blood pressure high today - follow up closely with your doctor regarding those issues as well.   Return to ER if worse, new symptoms, worsening or severe abdominal pain, fevers, recurrent or persistent chest pain, increased trouble breathing, or other concern.   You were given pain medication in the ER - no driving for the next 6 hours.

## 2018-10-26 NOTE — ED Triage Notes (Signed)
Patient complains of left sided chest pain 8/10 and LLQ and RLQ abdominal pain. He has vomited 3 times in the past 24 hours. He denies diarrhea.

## 2018-11-08 ENCOUNTER — Emergency Department (HOSPITAL_COMMUNITY): Payer: Medicaid Other

## 2018-11-08 ENCOUNTER — Observation Stay (HOSPITAL_COMMUNITY)
Admission: EM | Admit: 2018-11-08 | Discharge: 2018-11-09 | Disposition: A | Discharge status: Home / Self Care | Payer: Medicaid Other | Attending: Internal Medicine | Admitting: Internal Medicine

## 2018-11-08 ENCOUNTER — Other Ambulatory Visit: Payer: Self-pay

## 2018-11-08 DIAGNOSIS — I10 Essential (primary) hypertension: Secondary | ICD-10-CM | POA: Insufficient documentation

## 2018-11-08 DIAGNOSIS — M549 Dorsalgia, unspecified: Secondary | ICD-10-CM | POA: Diagnosis not present

## 2018-11-08 DIAGNOSIS — R1030 Lower abdominal pain, unspecified: Secondary | ICD-10-CM | POA: Diagnosis not present

## 2018-11-08 DIAGNOSIS — R740 Nonspecific elevation of levels of transaminase and lactic acid dehydrogenase [LDH]: Secondary | ICD-10-CM | POA: Diagnosis not present

## 2018-11-08 DIAGNOSIS — R112 Nausea with vomiting, unspecified: Secondary | ICD-10-CM | POA: Diagnosis not present

## 2018-11-08 DIAGNOSIS — F1490 Cocaine use, unspecified, uncomplicated: Secondary | ICD-10-CM | POA: Insufficient documentation

## 2018-11-08 DIAGNOSIS — Z79899 Other long term (current) drug therapy: Secondary | ICD-10-CM | POA: Insufficient documentation

## 2018-11-08 DIAGNOSIS — F329 Major depressive disorder, single episode, unspecified: Secondary | ICD-10-CM | POA: Diagnosis not present

## 2018-11-08 DIAGNOSIS — F1290 Cannabis use, unspecified, uncomplicated: Secondary | ICD-10-CM | POA: Insufficient documentation

## 2018-11-08 DIAGNOSIS — M1990 Unspecified osteoarthritis, unspecified site: Secondary | ICD-10-CM | POA: Diagnosis not present

## 2018-11-08 DIAGNOSIS — Z720 Tobacco use: Secondary | ICD-10-CM

## 2018-11-08 DIAGNOSIS — R079 Chest pain, unspecified: Secondary | ICD-10-CM | POA: Diagnosis present

## 2018-11-08 DIAGNOSIS — K709 Alcoholic liver disease, unspecified: Secondary | ICD-10-CM | POA: Insufficient documentation

## 2018-11-08 DIAGNOSIS — Z7982 Long term (current) use of aspirin: Secondary | ICD-10-CM | POA: Insufficient documentation

## 2018-11-08 DIAGNOSIS — R3915 Urgency of urination: Secondary | ICD-10-CM | POA: Insufficient documentation

## 2018-11-08 DIAGNOSIS — F17210 Nicotine dependence, cigarettes, uncomplicated: Secondary | ICD-10-CM | POA: Diagnosis not present

## 2018-11-08 DIAGNOSIS — Z590 Homelessness: Secondary | ICD-10-CM | POA: Insufficient documentation

## 2018-11-08 DIAGNOSIS — Z1159 Encounter for screening for other viral diseases: Secondary | ICD-10-CM | POA: Diagnosis not present

## 2018-11-08 DIAGNOSIS — R1084 Generalized abdominal pain: Secondary | ICD-10-CM | POA: Diagnosis present

## 2018-11-08 DIAGNOSIS — Z7289 Other problems related to lifestyle: Secondary | ICD-10-CM | POA: Diagnosis not present

## 2018-11-08 DIAGNOSIS — R1013 Epigastric pain: Secondary | ICD-10-CM | POA: Insufficient documentation

## 2018-11-08 DIAGNOSIS — F1020 Alcohol dependence, uncomplicated: Secondary | ICD-10-CM | POA: Diagnosis present

## 2018-11-08 DIAGNOSIS — I252 Old myocardial infarction: Secondary | ICD-10-CM | POA: Diagnosis not present

## 2018-11-08 DIAGNOSIS — R0789 Other chest pain: Secondary | ICD-10-CM | POA: Diagnosis not present

## 2018-11-08 DIAGNOSIS — F1910 Other psychoactive substance abuse, uncomplicated: Secondary | ICD-10-CM

## 2018-11-08 DIAGNOSIS — E785 Hyperlipidemia, unspecified: Secondary | ICD-10-CM | POA: Diagnosis not present

## 2018-11-08 DIAGNOSIS — R110 Nausea: Secondary | ICD-10-CM

## 2018-11-08 DIAGNOSIS — R825 Elevated urine levels of drugs, medicaments and biological substances: Secondary | ICD-10-CM

## 2018-11-08 DIAGNOSIS — K219 Gastro-esophageal reflux disease without esophagitis: Secondary | ICD-10-CM | POA: Diagnosis not present

## 2018-11-08 DIAGNOSIS — R7401 Elevation of levels of liver transaminase levels: Secondary | ICD-10-CM | POA: Diagnosis present

## 2018-11-08 DIAGNOSIS — G8929 Other chronic pain: Secondary | ICD-10-CM | POA: Diagnosis present

## 2018-11-08 DIAGNOSIS — Z803 Family history of malignant neoplasm of breast: Secondary | ICD-10-CM | POA: Insufficient documentation

## 2018-11-08 LAB — URINALYSIS, ROUTINE W REFLEX MICROSCOPIC
Bilirubin Urine: NEGATIVE
Glucose, UA: NEGATIVE mg/dL
Hgb urine dipstick: NEGATIVE
Ketones, ur: NEGATIVE mg/dL
Leukocytes,Ua: NEGATIVE
Nitrite: NEGATIVE
Protein, ur: NEGATIVE mg/dL
Specific Gravity, Urine: 1.018 (ref 1.005–1.030)
pH: 5 (ref 5.0–8.0)

## 2018-11-08 LAB — CBC
HCT: 36 % — ABNORMAL LOW (ref 39.0–52.0)
Hemoglobin: 12.5 g/dL — ABNORMAL LOW (ref 13.0–17.0)
MCH: 34.1 pg — ABNORMAL HIGH (ref 26.0–34.0)
MCHC: 34.7 g/dL (ref 30.0–36.0)
MCV: 98.1 fL (ref 80.0–100.0)
Platelets: 199 10*3/uL (ref 150–400)
RBC: 3.67 MIL/uL — ABNORMAL LOW (ref 4.22–5.81)
RDW: 16.3 % — ABNORMAL HIGH (ref 11.5–15.5)
WBC: 4 10*3/uL (ref 4.0–10.5)
nRBC: 0 % (ref 0.0–0.2)

## 2018-11-08 LAB — COMPREHENSIVE METABOLIC PANEL
ALT: 70 U/L — ABNORMAL HIGH (ref 0–44)
AST: 249 U/L — ABNORMAL HIGH (ref 15–41)
Albumin: 3.6 g/dL (ref 3.5–5.0)
Alkaline Phosphatase: 304 U/L — ABNORMAL HIGH (ref 38–126)
Anion gap: 13 (ref 5–15)
BUN: 14 mg/dL (ref 6–20)
CO2: 19 mmol/L — ABNORMAL LOW (ref 22–32)
Calcium: 9.2 mg/dL (ref 8.9–10.3)
Chloride: 99 mmol/L (ref 98–111)
Creatinine, Ser: 0.75 mg/dL (ref 0.61–1.24)
GFR calc Af Amer: >60 mL/min (ref >60)
GFR calc non Af Amer: >60 mL/min (ref >60)
Glucose, Bld: 106 mg/dL — ABNORMAL HIGH (ref 70–99)
Potassium: 4.6 mmol/L (ref 3.5–5.1)
Sodium: 131 mmol/L — ABNORMAL LOW (ref 135–145)
Total Bilirubin: 1.1 mg/dL (ref 0.3–1.2)
Total Protein: 8.1 g/dL (ref 6.5–8.1)

## 2018-11-08 LAB — TROPONIN I (HIGH SENSITIVITY)
Troponin I (High Sensitivity): 17 ng/L (ref <18)
Troponin I (High Sensitivity): 15 ng/L (ref <18)

## 2018-11-08 LAB — RAPID URINE DRUG SCREEN, HOSP PERFORMED
Amphetamines: NOT DETECTED
Barbiturates: NOT DETECTED
Benzodiazepines: NOT DETECTED
Cocaine: POSITIVE — AB
Opiates: NOT DETECTED
Tetrahydrocannabinol: POSITIVE — AB

## 2018-11-08 LAB — PROTIME-INR
INR: 1.3 — ABNORMAL HIGH (ref 0.8–1.2)
Prothrombin Time: 15.9 seconds — ABNORMAL HIGH (ref 11.4–15.2)

## 2018-11-08 LAB — LIPASE, BLOOD: Lipase: 59 U/L — ABNORMAL HIGH (ref 11–51)

## 2018-11-08 LAB — D-DIMER, QUANTITATIVE: D-Dimer, Quant: 0.52 ug/mL-FEU — ABNORMAL HIGH (ref 0.00–0.50)

## 2018-11-08 LAB — SARS CORONAVIRUS 2 BY RT PCR (HOSPITAL ORDER, PERFORMED IN ~~LOC~~ HOSPITAL LAB): SARS Coronavirus 2: NEGATIVE

## 2018-11-08 MED ORDER — FAMOTIDINE 20 MG PO TABS
20.0000 mg | ORAL_TABLET | Freq: Two times a day (BID) | ORAL | Status: DC
  Administered 2018-11-08 – 2018-11-09 (×2): 20 mg via ORAL
  Filled 2018-11-08 (×2): qty 1

## 2018-11-08 MED ORDER — ALUM & MAG HYDROXIDE-SIMETH 200-200-20 MG/5ML PO SUSP
30.0000 mL | Freq: Once | ORAL | Status: AC
  Administered 2018-11-08: 30 mL via ORAL
  Filled 2018-11-08: qty 30

## 2018-11-08 MED ORDER — LORAZEPAM 1 MG PO TABS: 1.0000 mg | ORAL_TABLET | Freq: Four times a day (QID) | ORAL | Status: DC | PRN

## 2018-11-08 MED ORDER — LABETALOL HCL 5 MG/ML IV SOLN: 5.0000 mg | INTRAVENOUS | Status: DC | PRN

## 2018-11-08 MED ORDER — LIDOCAINE VISCOUS HCL 2 % MT SOLN
15.0000 mL | Freq: Once | OROMUCOSAL | Status: AC
  Administered 2018-11-08: 15 mL via ORAL
  Filled 2018-11-08: qty 15

## 2018-11-08 MED ORDER — IOPAMIDOL (ISOVUE-300) INJECTION 61%: 100.0000 mL | Freq: Once | INTRAVENOUS | Status: DC | PRN

## 2018-11-08 MED ORDER — ONDANSETRON HCL 4 MG/2ML IJ SOLN
4.0000 mg | Freq: Once | INTRAMUSCULAR | Status: AC
  Administered 2018-11-08: 4 mg via INTRAVENOUS
  Filled 2018-11-08: qty 2

## 2018-11-08 MED ORDER — IOHEXOL 300 MG/ML  SOLN
100.0000 mL | Freq: Once | INTRAMUSCULAR | Status: AC | PRN
  Administered 2018-11-08: 100 mL via INTRAVENOUS

## 2018-11-08 MED ORDER — PANTOPRAZOLE SODIUM 40 MG PO TBEC
80.0000 mg | DELAYED_RELEASE_TABLET | Freq: Every day | ORAL | Status: DC
  Administered 2018-11-09: 80 mg via ORAL
  Filled 2018-11-08: qty 2

## 2018-11-08 MED ORDER — ASPIRIN EC 81 MG PO TBEC
81.0000 mg | DELAYED_RELEASE_TABLET | Freq: Every day | ORAL | Status: DC
  Administered 2018-11-09: 81 mg via ORAL
  Filled 2018-11-08: qty 1

## 2018-11-08 MED ORDER — ACETAMINOPHEN 325 MG PO TABS: 650.0000 mg | ORAL_TABLET | Freq: Four times a day (QID) | ORAL | Status: DC | PRN

## 2018-11-08 MED ORDER — LORAZEPAM 2 MG/ML IJ SOLN: 0.0000 mg | Freq: Four times a day (QID) | INTRAMUSCULAR | Status: DC

## 2018-11-08 MED ORDER — ADULT MULTIVITAMIN W/MINERALS CH
1.0000 | ORAL_TABLET | Freq: Every day | ORAL | Status: DC
  Administered 2018-11-09: 1 via ORAL
  Filled 2018-11-08 (×2): qty 1

## 2018-11-08 MED ORDER — NITROGLYCERIN 0.4 MG SL SUBL
0.4000 mg | SUBLINGUAL_TABLET | SUBLINGUAL | Status: AC | PRN
  Administered 2018-11-08 (×3): 0.4 mg via SUBLINGUAL
  Filled 2018-11-08: qty 1

## 2018-11-08 MED ORDER — ACETAMINOPHEN 650 MG RE SUPP: 650.0000 mg | Freq: Four times a day (QID) | RECTAL | Status: DC | PRN

## 2018-11-08 MED ORDER — THIAMINE HCL 100 MG/ML IJ SOLN
Freq: Once | INTRAVENOUS | Status: AC
  Administered 2018-11-08: 17:00:00 via INTRAVENOUS
  Filled 2018-11-08: qty 1000

## 2018-11-08 MED ORDER — VITAMIN B-1 100 MG PO TABS: 100.0000 mg | ORAL_TABLET | Freq: Every day | ORAL | Status: DC

## 2018-11-08 MED ORDER — LORAZEPAM 2 MG/ML IJ SOLN: 1.0000 mg | Freq: Four times a day (QID) | INTRAMUSCULAR | Status: DC | PRN

## 2018-11-08 MED ORDER — LORAZEPAM 2 MG/ML IJ SOLN: 0.0000 mg | Freq: Two times a day (BID) | INTRAMUSCULAR | Status: DC

## 2018-11-08 MED ORDER — FOLIC ACID 1 MG PO TABS
1.0000 mg | ORAL_TABLET | Freq: Every day | ORAL | Status: DC
  Administered 2018-11-09: 1 mg via ORAL
  Filled 2018-11-08 (×2): qty 1

## 2018-11-08 MED ORDER — METOPROLOL SUCCINATE ER 25 MG PO TB24
25.0000 mg | ORAL_TABLET | Freq: Every day | ORAL | Status: DC
  Administered 2018-11-08 – 2018-11-09 (×2): 25 mg via ORAL
  Filled 2018-11-08 (×2): qty 1

## 2018-11-08 MED ORDER — ADULT MULTIVITAMIN W/MINERALS CH: 1.0000 | ORAL_TABLET | Freq: Every day | ORAL | Status: DC

## 2018-11-08 MED ORDER — SODIUM CHLORIDE 0.9 % IV BOLUS
500.0000 mL | Freq: Once | INTRAVENOUS | Status: AC
  Administered 2018-11-08: 500 mL via INTRAVENOUS

## 2018-11-08 MED ORDER — SODIUM CHLORIDE 0.9% FLUSH
3.0000 mL | Freq: Two times a day (BID) | INTRAVENOUS | Status: DC
  Administered 2018-11-08 – 2018-11-09 (×2): 3 mL via INTRAVENOUS

## 2018-11-08 MED ORDER — ASPIRIN 81 MG PO CHEW
324.0000 mg | CHEWABLE_TABLET | Freq: Once | ORAL | Status: AC
  Administered 2018-11-08: 324 mg via ORAL
  Filled 2018-11-08: qty 4

## 2018-11-08 MED ORDER — ENOXAPARIN SODIUM 40 MG/0.4ML ~~LOC~~ SOLN
40.0000 mg | SUBCUTANEOUS | Status: DC
  Administered 2018-11-08: 40 mg via SUBCUTANEOUS
  Filled 2018-11-08: qty 0.4

## 2018-11-08 MED ORDER — FOLIC ACID 1 MG PO TABS: 1.0000 mg | ORAL_TABLET | Freq: Every day | ORAL | Status: DC

## 2018-11-08 MED ORDER — VITAMIN B-1 100 MG PO TABS
100.0000 mg | ORAL_TABLET | Freq: Every day | ORAL | Status: DC
  Administered 2018-11-09: 100 mg via ORAL
  Filled 2018-11-08 (×2): qty 1

## 2018-11-08 MED ORDER — IOHEXOL 350 MG/ML SOLN
100.0000 mL | Freq: Once | INTRAVENOUS | Status: AC | PRN
  Administered 2018-11-08: 75 mL via INTRAVENOUS

## 2018-11-08 MED ORDER — ONDANSETRON HCL 4 MG PO TABS: 4.0000 mg | ORAL_TABLET | Freq: Four times a day (QID) | ORAL | Status: DC | PRN

## 2018-11-08 MED ORDER — ONDANSETRON HCL 4 MG/2ML IJ SOLN: 4.0000 mg | Freq: Four times a day (QID) | INTRAMUSCULAR | Status: DC | PRN
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
## 2018-11-08 NOTE — ED Triage Notes (Signed)
Pt report that he woke up this morning with chest pain and abdominal pain. Pt reports he had two episodes of vomiting this morning. Pt reporting 10/10 abdominal pain and 10/10 central chest pain. Pt denying any diarrhea. Pt does report some shortness of breath this morning as well. Pt alert and oriented x4 at present.

## 2018-11-08 NOTE — ED Notes (Signed)
3E unable to take report at this time.  

## 2018-11-09 DIAGNOSIS — R1084 Generalized abdominal pain: Secondary | ICD-10-CM | POA: Diagnosis not present

## 2018-11-09 DIAGNOSIS — R0789 Other chest pain: Secondary | ICD-10-CM | POA: Diagnosis not present

## 2018-11-09 DIAGNOSIS — R079 Chest pain, unspecified: Secondary | ICD-10-CM | POA: Diagnosis not present

## 2018-11-09 DIAGNOSIS — R1030 Lower abdominal pain, unspecified: Secondary | ICD-10-CM | POA: Diagnosis not present

## 2018-11-09 DIAGNOSIS — I10 Essential (primary) hypertension: Secondary | ICD-10-CM | POA: Diagnosis not present

## 2018-11-09 DIAGNOSIS — R112 Nausea with vomiting, unspecified: Secondary | ICD-10-CM

## 2018-11-09 DIAGNOSIS — Z1159 Encounter for screening for other viral diseases: Secondary | ICD-10-CM | POA: Diagnosis not present

## 2018-11-09 LAB — COMPREHENSIVE METABOLIC PANEL
ALT: 84 U/L — ABNORMAL HIGH (ref 0–44)
AST: 266 U/L — ABNORMAL HIGH (ref 15–41)
Albumin: 3.2 g/dL — ABNORMAL LOW (ref 3.5–5.0)
Alkaline Phosphatase: 304 U/L — ABNORMAL HIGH (ref 38–126)
Anion gap: 10 (ref 5–15)
BUN: 5 mg/dL — ABNORMAL LOW (ref 6–20)
CO2: 24 mmol/L (ref 22–32)
Calcium: 9 mg/dL (ref 8.9–10.3)
Chloride: 101 mmol/L (ref 98–111)
Creatinine, Ser: 0.9 mg/dL (ref 0.61–1.24)
GFR calc Af Amer: >60 mL/min (ref >60)
GFR calc non Af Amer: >60 mL/min (ref >60)
Glucose, Bld: 98 mg/dL (ref 70–99)
Potassium: 3.9 mmol/L (ref 3.5–5.1)
Sodium: 135 mmol/L (ref 135–145)
Total Bilirubin: 1.9 mg/dL — ABNORMAL HIGH (ref 0.3–1.2)
Total Protein: 7.8 g/dL (ref 6.5–8.1)

## 2018-11-09 LAB — CBC
HCT: 36.6 % — ABNORMAL LOW (ref 39.0–52.0)
Hemoglobin: 12 g/dL — ABNORMAL LOW (ref 13.0–17.0)
MCH: 32.5 pg (ref 26.0–34.0)
MCHC: 32.8 g/dL (ref 30.0–36.0)
MCV: 99.2 fL (ref 80.0–100.0)
Platelets: 164 10*3/uL (ref 150–400)
RBC: 3.69 MIL/uL — ABNORMAL LOW (ref 4.22–5.81)
RDW: 15.8 % — ABNORMAL HIGH (ref 11.5–15.5)
WBC: 4 10*3/uL (ref 4.0–10.5)
nRBC: 0 % (ref 0.0–0.2)

## 2018-11-09 NOTE — Progress Notes (Signed)
  Subjective:  [name] reports he is doing better today. He states that his chest pain is gone but still has some tenderness when people press on his chest. He says is abdominal pain is improved but still a little sore. We discussed the need for alcohol and cocaine cessation. Patient states its difficult but understands the need to quit drinking. Patient denies cocaine use but thinks his marijuana might be laced.  [name] states that he was staying at a motel but does not have any money for this. He also does not have a ride.   Objective:    Vital Signs (last 24 hours): Vitals:   11/08/18 1646 11/08/18 1740 11/08/18 2044 11/09/18 0526  BP: (!) 166/111 137/88 (!) 166/115 (!) 162/104  Pulse: 98 99 95 90  Resp:  20 20 20   Temp:  99.2 F (37.3 C) (!) 97.5 F (36.4 C) 98.6 F (37 C)  TempSrc:  Oral Oral Oral  SpO2:  100% 100% 99%  Weight:  69.8 kg  70.4 kg  Height:  5\' 10"  (1.778 m)      Physical Exam: General Alert and oriented, no acute distress  Cardiac Regular rate and rhythm, no murmurs, rubs, or gallops  Pulmonary Clear to auscultation bilaterally without wheezes, rhonchi, or rales  Abdominal Soft, tender to palpation, without distention. Bowel sounds present  Extremities No peripheral edema      Assessment/Plan:   Principal Problem:   Abdominal pain, chronic, epigastric Active Problems:   Transaminitis   Chest pain   Alcohol use disorder, severe, dependence ([place])  [name] is a 58 yo male with PMH of substance abuse, alcoholic liver disease, GERD, HTN, HLD, and MI in 2003 who presented yesterday with 1 day of chest pain and abdominal pain. Patient is doing well with improved pain.  # Chest Pain: Troponin negative x2, EKG without ST changes at admission and repeat this morning. CXR without evidence for pneumothorax or acute rib fractures. Chest pain likely secondary to cocaine use with UDS positive. No chest pain resolved by time of admission. Patient counseled on  need for cocaine cessation.  # Nausea: In setting of heavy alcoholc use and cocaine use. May be secondary to gastritis or cocaine use. * Zofran PRN for nausea, QTc 460 on EKG this AM. Patient has not required zofran since admission * Will start regular diet and monitor patient  # HTN: SBPs elevated to 180s. Patient was unable to take HTN medications on day prior to admission due to emesis * Home metoprolol 25 mg QD * Labetalol 5 mg Q2HR IV PRN for SBP > 180 or DBP > 120, no doses required  # Alcohol Use Disorder: History of chronic alcohol use and AST/ALT > 2. Patient counseled on need to stop drinking. * CIWA protocol * Patient counseled on need to quit drinking  Dispo: Discharge this evening pending arrangement of location/transport and patient does well on normal diet.   Jeanmarie Hubert, MD 11/09/2018, 7:24 AM Pager: [phone]

## 2018-11-09 NOTE — Progress Notes (Signed)
Discharge this evening pending arrangement of location/transport and patient does well on normal diet per Dr. Benjamine Mola, MD, internal medicine.  SW consulted and called about arrangements.  Pt to be discharged with transport by public bus, with no bus pass needed.  No further coordination needed by SW.

## 2018-11-09 NOTE — Progress Notes (Signed)
Patient discharged: Home   Via: Wheelchair   Discharge paperwork given: to patient   Reviewed with teach back  IV and telemetry disconnected  Belongings given to patient     

## 2018-11-09 NOTE — Progress Notes (Addendum)
Patient tolerated dinner well. No issues. Friend is coming to pick him up. IV and  teli d/c. Paperwork given. All questions answered.

## 2018-11-10 NOTE — Discharge Summary (Signed)
Name: Devin [name] MRN: [id number] DOB: 1960-05-31 58 y.o. PCP: Katherine Roan, MD  Date of Admission: 11/08/2018  9:20 AM Date of Discharge: 11/09/2018 Attending Physician: No att. providers found  Discharge Diagnosis: 1. Chest Pain 2. Abdominal Pain  Discharge Medications: Allergies as of 11/09/2018   No Known Allergies     Medication List    STOP taking these medications   omeprazole 20 MG capsule Commonly known as: PRILOSEC   sucralfate 1 g tablet Commonly known as: Carafate     TAKE these medications   acetaminophen 500 MG tablet Commonly known as: TYLENOL Take 1,000 mg by mouth every 6 (six) hours as needed for moderate pain.   aspirin EC 81 MG tablet Take 1 tablet (81 mg total) by mouth daily.   famotidine 20 MG tablet Commonly known as: PEPCID Take 1 tablet (20 mg total) by mouth 2 (two) times daily.   folic acid 1 MG tablet Commonly known as: FOLVITE Take 1 tablet (1 mg total) by mouth daily.   metoprolol succinate 50 MG 24 hr tablet Commonly known as: TOPROL-XL Take 3 tablets (150 mg total) by mouth daily.   ondansetron 4 MG disintegrating tablet Commonly known as: Zofran ODT Take 1 tablet (4 mg total) by mouth every 8 (eight) hours as needed for nausea or vomiting.   pantoprazole 40 MG tablet Commonly known as: PROTONIX Take 1 tablet (40 mg total) by mouth daily.   thiamine 100 MG tablet Take 1 tablet (100 mg total) by mouth daily.       Disposition and follow-up:   [name] was discharged from [facility] in Stable condition.  At the hospital follow up visit please address:  1.  Please assess for continued chest pain and/or abdominal pain. Provide counseling on need for alcohol and cocaine cessation.  2.  Labs / imaging needed at time of follow-up: None  3.  Pending labs/ test needing follow-up: None  Follow-up Appointments: Follow-up Information    [facility] Follow  up.   Why: our office will call you on Monday toschedule a hospital follow up appointment within the next week.  Contact information: [address] [place] Hospital Course by problem list: 1. Chest Pain - Patient presented with a one day history of chest pain. Troponin negative x2, EKG without ST changes, CXR demonstrated no acute process. Mildly elevated D-dimer but CTA chest without evidence for PE. UDS was positive for THC and cocaine and history significant for considerable alcohol usage. Patient denied cocaine usage but said his marijuana may have been laced. Chest pain likely secondary to cocaine usage. By time of admission, patient was not experiencing chest pain but had continued abdominal pain.  2. Abdominal Pain - Patient presented with generalized abdominal pain and multiple episodes of non-bloody non-bilious emesis. CT abdomen/pelvis revealed no acute abnormality. Given history of heavy alcohol use, symptoms likely secondary to alcoholic gastritis. Patient advised on the importance of alcohol cessation. Patient discharged with significant improvement in abdominal pain and able to tolerate normal diet.  Discharge Vitals:   BP (!) 155/100 (BP Location: Left Arm)   Pulse 89   Temp 97.9 F (36.6 C) (Oral)   Resp 18   Ht 5\' 10"  (1.778 m)   Wt 70.4 kg   SpO2 100%   BMI 22.28 kg/m   Pertinent Labs, Studies, and Procedures:  DG Chest (11/08/18): IMPRESSION: No edema or consolidation.  Stable cardiac silhouette.  CTA Chest (11/08/18): IMPRESSION: 1. No acute abnormality and no pulmonary emboli seen. 2. Mild right apical paraseptal emphysema.  CT Abdomen Pelvis W Contrast (11/08/18): IMPRESSION: 1. Fatty infiltration of liver. 2. Mild prostatic enlargement. 3. No acute intra-abdominal or intrapelvic abnormalities.  Drugs of Abuse     Component Value Date/Time   LABOPIA NONE DETECTED 11/08/2018 0939   COCAINSCRNUR POSITIVE  (A) 11/08/2018 0939   COCAINSCRNUR NEG 09/29/2014 1505   LABBENZ NONE DETECTED 11/08/2018 0939   LABBENZ NEG 06/08/2010 1600   AMPHETMU NONE DETECTED 11/08/2018 0939   THCU POSITIVE (A) 11/08/2018 0939   LABBARB NONE DETECTED 11/08/2018 0939   Lipase (11/08/18): 59  CBC Latest Ref Rng & Units 11/09/2018 11/08/2018 10/26/2018  WBC 4.0 - 10.5 K/uL 4.0 4.0 3.5(L)  Hemoglobin 13.0 - 17.0 g/dL 12.0(L) 12.5(L) 11.6(L)  Hematocrit 39.0 - 52.0 % 36.6(L) 36.0(L) 35.2(L)  Platelets 150 - 400 K/uL 164 199 165   BMP Latest Ref Rng & Units 11/09/2018 11/08/2018 10/26/2018  Glucose 70 - 99 mg/dL 98 106(H) 109(H)  BUN 6 - 20 mg/dL 5(L) 14 11  Creatinine 0.61 - 1.24 mg/dL 0.90 0.75 0.94  BUN/Creat Ratio 9 - 20 - - -  Sodium 135 - 145 mmol/L 135 131(L) 134(L)  Potassium 3.5 - 5.1 mmol/L 3.9 4.6 4.2  Chloride 98 - 111 mmol/L 101 99 102  CO2 22 - 32 mmol/L 24 19(L) 20(L)  Calcium 8.9 - 10.3 mg/dL 9.0 9.2 9.3    Discharge Instructions: Discharge Instructions    Call MD for:  persistant nausea and vomiting   Complete by: As directed    Call MD for:  severe uncontrolled pain   Complete by: As directed    Diet - low sodium heart healthy   Complete by: As directed    Discharge instructions   Complete by: As directed    During your hospitalization you were treated for chest and abdominal pain. We think that your stomach pain was caused by drinking alcohol. It is important that you stop drinking to improve your health and so that you do not experience this pain again. We think that the pain you had in your chest was due to cocaine use. To prevent this from happening, we recommend you stop using all recreational drugs. It is possible that the drugs you were using were laced with additional chemical including cocaine and this can be very dangerous for your health. It is also important that you take all your prescribed medications as instructed.   Increase activity slowly   Complete by: As directed        Signed: Jeanmarie Hubert, MD 11/10/2018, 6:08 PM   Pager: [phone]

## 2018-11-11 ENCOUNTER — Other Ambulatory Visit: Payer: Self-pay

## 2018-11-11 ENCOUNTER — Emergency Department (HOSPITAL_COMMUNITY)
Admission: EM | Admit: 2018-11-11 | Discharge: 2018-11-12 | Disposition: A | Discharge status: Home / Self Care | Payer: Medicaid Other | Attending: Emergency Medicine | Admitting: Emergency Medicine

## 2018-11-11 ENCOUNTER — Encounter (HOSPITAL_COMMUNITY): Payer: Self-pay | Admitting: *Deleted

## 2018-11-11 DIAGNOSIS — Z7982 Long term (current) use of aspirin: Secondary | ICD-10-CM | POA: Insufficient documentation

## 2018-11-11 DIAGNOSIS — I252 Old myocardial infarction: Secondary | ICD-10-CM | POA: Diagnosis not present

## 2018-11-11 DIAGNOSIS — Z79899 Other long term (current) drug therapy: Secondary | ICD-10-CM | POA: Insufficient documentation

## 2018-11-11 DIAGNOSIS — I10 Essential (primary) hypertension: Secondary | ICD-10-CM | POA: Insufficient documentation

## 2018-11-11 DIAGNOSIS — R1011 Right upper quadrant pain: Secondary | ICD-10-CM | POA: Insufficient documentation

## 2018-11-11 DIAGNOSIS — F17210 Nicotine dependence, cigarettes, uncomplicated: Secondary | ICD-10-CM | POA: Insufficient documentation

## 2018-11-11 DIAGNOSIS — Z590 Homelessness: Secondary | ICD-10-CM | POA: Insufficient documentation

## 2018-11-11 DIAGNOSIS — R52 Pain, unspecified: Secondary | ICD-10-CM

## 2018-11-11 LAB — CBC
HCT: 35.8 % — ABNORMAL LOW (ref 39.0–52.0)
Hemoglobin: 11.9 g/dL — ABNORMAL LOW (ref 13.0–17.0)
MCH: 33 pg (ref 26.0–34.0)
MCHC: 33.2 g/dL (ref 30.0–36.0)
MCV: 99.2 fL (ref 80.0–100.0)
Platelets: 217 10*3/uL (ref 150–400)
RBC: 3.61 MIL/uL — ABNORMAL LOW (ref 4.22–5.81)
RDW: 16.4 % — ABNORMAL HIGH (ref 11.5–15.5)
WBC: 4.7 10*3/uL (ref 4.0–10.5)
nRBC: 0 % (ref 0.0–0.2)

## 2018-11-11 MED ORDER — SODIUM CHLORIDE 0.9% FLUSH: 3.0000 mL | Freq: Once | INTRAVENOUS | Status: DC

## 2018-11-11 NOTE — ED Triage Notes (Signed)
Pt arrived  By [facility] for abd pain for several days and nausea.

## 2018-11-12 ENCOUNTER — Emergency Department (HOSPITAL_COMMUNITY): Payer: Medicaid Other

## 2018-11-12 LAB — COMPREHENSIVE METABOLIC PANEL
ALT: 70 U/L — ABNORMAL HIGH (ref 0–44)
AST: 118 U/L — ABNORMAL HIGH (ref 15–41)
Albumin: 3.4 g/dL — ABNORMAL LOW (ref 3.5–5.0)
Alkaline Phosphatase: 285 U/L — ABNORMAL HIGH (ref 38–126)
Anion gap: 12 (ref 5–15)
BUN: 13 mg/dL (ref 6–20)
CO2: 18 mmol/L — ABNORMAL LOW (ref 22–32)
Calcium: 9.2 mg/dL (ref 8.9–10.3)
Chloride: 105 mmol/L (ref 98–111)
Creatinine, Ser: 1.31 mg/dL — ABNORMAL HIGH (ref 0.61–1.24)
GFR calc Af Amer: >60 mL/min (ref >60)
GFR calc non Af Amer: >60 mL/min (ref >60)
Glucose, Bld: 95 mg/dL (ref 70–99)
Potassium: 3.9 mmol/L (ref 3.5–5.1)
Sodium: 135 mmol/L (ref 135–145)
Total Bilirubin: 1.4 mg/dL — ABNORMAL HIGH (ref 0.3–1.2)
Total Protein: 7.8 g/dL (ref 6.5–8.1)

## 2018-11-12 LAB — URINALYSIS, ROUTINE W REFLEX MICROSCOPIC
Bilirubin Urine: NEGATIVE
Glucose, UA: NEGATIVE mg/dL
Hgb urine dipstick: NEGATIVE
Ketones, ur: NEGATIVE mg/dL
Leukocytes,Ua: NEGATIVE
Nitrite: NEGATIVE
Protein, ur: NEGATIVE mg/dL
Specific Gravity, Urine: 1.018 (ref 1.005–1.030)
pH: 5 (ref 5.0–8.0)

## 2018-11-12 LAB — LIPASE, BLOOD: Lipase: 77 U/L — ABNORMAL HIGH (ref 11–51)

## 2018-11-12 MED ORDER — ONDANSETRON 4 MG PO TBDP
4.0000 mg | ORAL_TABLET | Freq: Once | ORAL | Status: AC
  Administered 2018-11-12: 4 mg via ORAL
  Filled 2018-11-12: qty 1

## 2018-11-12 MED ORDER — FENTANYL CITRATE (PF) 100 MCG/2ML IJ SOLN
100.0000 ug | Freq: Once | INTRAMUSCULAR | Status: AC
  Administered 2018-11-12: 100 ug via INTRAMUSCULAR
  Filled 2018-11-12: qty 2
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
## 2018-11-18 ENCOUNTER — Other Ambulatory Visit: Payer: Self-pay

## 2018-11-18 ENCOUNTER — Emergency Department (HOSPITAL_COMMUNITY)
Admission: EM | Admit: 2018-11-18 | Discharge: 2018-11-18 | Disposition: A | Discharge status: Home / Self Care | Payer: Medicaid Other | Attending: Emergency Medicine | Admitting: Emergency Medicine

## 2018-11-18 ENCOUNTER — Encounter (HOSPITAL_COMMUNITY): Payer: Self-pay | Admitting: Emergency Medicine

## 2018-11-18 DIAGNOSIS — R109 Unspecified abdominal pain: Secondary | ICD-10-CM | POA: Insufficient documentation

## 2018-11-18 DIAGNOSIS — R079 Chest pain, unspecified: Secondary | ICD-10-CM | POA: Diagnosis present

## 2018-11-18 DIAGNOSIS — K2900 Acute gastritis without bleeding: Secondary | ICD-10-CM

## 2018-11-18 DIAGNOSIS — Z7982 Long term (current) use of aspirin: Secondary | ICD-10-CM | POA: Diagnosis not present

## 2018-11-18 DIAGNOSIS — E860 Dehydration: Secondary | ICD-10-CM

## 2018-11-18 DIAGNOSIS — I10 Essential (primary) hypertension: Secondary | ICD-10-CM | POA: Insufficient documentation

## 2018-11-18 DIAGNOSIS — Z79899 Other long term (current) drug therapy: Secondary | ICD-10-CM | POA: Diagnosis not present

## 2018-11-18 DIAGNOSIS — F17210 Nicotine dependence, cigarettes, uncomplicated: Secondary | ICD-10-CM | POA: Diagnosis not present

## 2018-11-18 DIAGNOSIS — G8929 Other chronic pain: Secondary | ICD-10-CM

## 2018-11-18 LAB — CBC WITH DIFFERENTIAL/PLATELET
Abs Immature Granulocytes: 0.01 10*3/uL (ref 0.00–0.07)
Basophils Absolute: 0 10*3/uL (ref 0.0–0.1)
Basophils Relative: 1 %
Eosinophils Absolute: 0 10*3/uL (ref 0.0–0.5)
Eosinophils Relative: 1 %
HCT: 36.6 % — ABNORMAL LOW (ref 39.0–52.0)
Hemoglobin: 12.6 g/dL — ABNORMAL LOW (ref 13.0–17.0)
Immature Granulocytes: 0 %
Lymphocytes Relative: 27 %
Lymphs Abs: 1 10*3/uL (ref 0.7–4.0)
MCH: 33.7 pg (ref 26.0–34.0)
MCHC: 34.4 g/dL (ref 30.0–36.0)
MCV: 97.9 fL (ref 80.0–100.0)
Monocytes Absolute: 0.4 10*3/uL (ref 0.1–1.0)
Monocytes Relative: 10 %
Neutro Abs: 2.2 10*3/uL (ref 1.7–7.7)
Neutrophils Relative %: 61 %
Platelets: 207 10*3/uL (ref 150–400)
RBC: 3.74 MIL/uL — ABNORMAL LOW (ref 4.22–5.81)
RDW: 16.1 % — ABNORMAL HIGH (ref 11.5–15.5)
WBC: 3.6 10*3/uL — ABNORMAL LOW (ref 4.0–10.5)
nRBC: 0 % (ref 0.0–0.2)

## 2018-11-18 LAB — TROPONIN I (HIGH SENSITIVITY)
Troponin I (High Sensitivity): 25 ng/L — ABNORMAL HIGH (ref <18)
Troponin I (High Sensitivity): 22 ng/L — ABNORMAL HIGH (ref <18)

## 2018-11-18 LAB — COMPREHENSIVE METABOLIC PANEL
ALT: 116 U/L — ABNORMAL HIGH (ref 0–44)
AST: 367 U/L — ABNORMAL HIGH (ref 15–41)
Albumin: 3.6 g/dL (ref 3.5–5.0)
Alkaline Phosphatase: 404 U/L — ABNORMAL HIGH (ref 38–126)
Anion gap: 16 — ABNORMAL HIGH (ref 5–15)
BUN: 13 mg/dL (ref 6–20)
CO2: 18 mmol/L — ABNORMAL LOW (ref 22–32)
Calcium: 9.2 mg/dL (ref 8.9–10.3)
Chloride: 97 mmol/L — ABNORMAL LOW (ref 98–111)
Creatinine, Ser: 0.85 mg/dL (ref 0.61–1.24)
GFR calc Af Amer: >60 mL/min (ref >60)
GFR calc non Af Amer: >60 mL/min (ref >60)
Glucose, Bld: 113 mg/dL — ABNORMAL HIGH (ref 70–99)
Potassium: 4.2 mmol/L (ref 3.5–5.1)
Sodium: 131 mmol/L — ABNORMAL LOW (ref 135–145)
Total Bilirubin: 1.9 mg/dL — ABNORMAL HIGH (ref 0.3–1.2)
Total Protein: 8.2 g/dL — ABNORMAL HIGH (ref 6.5–8.1)

## 2018-11-18 LAB — LIPASE, BLOOD: Lipase: 89 U/L — ABNORMAL HIGH (ref 11–51)

## 2018-11-18 LAB — ETHANOL: Alcohol, Ethyl (B): <10 mg/dL (ref <10)

## 2018-11-18 MED ORDER — ALUM & MAG HYDROXIDE-SIMETH 200-200-20 MG/5ML PO SUSP
30.0000 mL | Freq: Once | ORAL | Status: AC
  Administered 2018-11-18: 30 mL via ORAL
  Filled 2018-11-18: qty 30

## 2018-11-18 MED ORDER — SODIUM CHLORIDE 0.9% FLUSH: 3.0000 mL | Freq: Once | INTRAVENOUS | Status: DC

## 2018-11-18 MED ORDER — ONDANSETRON HCL 4 MG/2ML IJ SOLN
4.0000 mg | Freq: Once | INTRAMUSCULAR | Status: AC
  Administered 2018-11-18: 4 mg via INTRAVENOUS
  Filled 2018-11-18: qty 2

## 2018-11-18 MED ORDER — LORAZEPAM 2 MG/ML IJ SOLN
1.0000 mg | Freq: Once | INTRAMUSCULAR | Status: AC
  Administered 2018-11-18: 1 mg via INTRAVENOUS
  Filled 2018-11-18: qty 1

## 2018-11-18 MED ORDER — PANTOPRAZOLE SODIUM 40 MG PO TBEC: 40.0000 mg | DELAYED_RELEASE_TABLET | Freq: Every day | ORAL | 0 refills | Status: DC

## 2018-11-18 MED ORDER — MORPHINE SULFATE (PF) 4 MG/ML IV SOLN
4.0000 mg | Freq: Once | INTRAVENOUS | Status: AC
  Administered 2018-11-18: 20:00:00 4 mg via INTRAVENOUS
  Filled 2018-11-18: qty 1

## 2018-11-18 MED ORDER — SODIUM CHLORIDE 0.9 % IV BOLUS
1000.0000 mL | Freq: Once | INTRAVENOUS | Status: AC
  Administered 2018-11-18: 1000 mL via INTRAVENOUS

## 2018-11-18 MED ORDER — SODIUM CHLORIDE 0.9 % IV SOLN
Freq: Once | INTRAVENOUS | Status: AC
  Administered 2018-11-18: 17:00:00 999 mL via INTRAVENOUS

## 2018-11-18 MED ORDER — ONDANSETRON 4 MG PO TBDP: 4.0000 mg | ORAL_TABLET | Freq: Three times a day (TID) | ORAL | 0 refills | Status: DC | PRN

## 2018-11-18 MED ORDER — MORPHINE SULFATE (PF) 4 MG/ML IV SOLN
4.0000 mg | Freq: Once | INTRAVENOUS | Status: AC
  Administered 2018-11-18: 4 mg via INTRAVENOUS
  Filled 2018-11-18: qty 1

## 2018-11-18 MED ORDER — DICYCLOMINE HCL 20 MG PO TABS: 20.0000 mg | ORAL_TABLET | Freq: Two times a day (BID) | ORAL | 0 refills | Status: DC

## 2018-11-18 MED ORDER — LACTATED RINGERS IV BOLUS: 1000.0000 mL | Freq: Once | INTRAVENOUS | Status: DC

## 2018-11-18 MED ORDER — PANTOPRAZOLE SODIUM 40 MG IV SOLR
40.0000 mg | Freq: Once | INTRAVENOUS | Status: AC
  Administered 2018-11-18: 40 mg via INTRAVENOUS
  Filled 2018-11-18: qty 40

## 2018-11-18 MED ORDER — DICYCLOMINE HCL 10 MG/ML IM SOLN
20.0000 mg | Freq: Once | INTRAMUSCULAR | Status: AC
  Administered 2018-11-18: 20 mg via INTRAMUSCULAR
  Filled 2018-11-18: qty 2

## 2018-11-18 NOTE — ED Notes (Signed)
Pt able to tolerate fluids. Drank full cup of soda without any complaint

## 2018-11-18 NOTE — Discharge Instructions (Signed)
The prescription for the stomach medicine and nausea medicine was sent to the [facility] at [place].  Please take your blood pressure medication tomorrow morning.  It is very important for you to see your doctor tomorrow because if your symptoms continue you may need to see a specialist for the stomach again.  Avoid alcohol, spicy or fatty foods.  Eat a very bland diet.

## 2018-11-18 NOTE — ED Notes (Signed)
Pt given sandwhich and coke at his request

## 2018-11-18 NOTE — ED Notes (Signed)
Pt states no change in pain . Will inform MD.

## 2018-11-18 NOTE — ED Notes (Signed)
30 % of sandwhich eaten.
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
## 2018-11-18 NOTE — ED Triage Notes (Signed)
Pt here for reeval of 2 weeks of abdominal pain, lower, with nausea and vomiting, chest pain as well. Denies diarrhea. Emesis X 5 today.

## 2018-11-18 NOTE — ED Notes (Signed)
Dr. Plunkett at bedside.  

## 2018-11-19 ENCOUNTER — Encounter: Payer: Self-pay | Admitting: Internal Medicine

## 2018-11-19 ENCOUNTER — Encounter: Payer: Medicaid Other | Admitting: Internal Medicine

## 2018-11-28 ENCOUNTER — Other Ambulatory Visit: Payer: Self-pay

## 2018-11-28 ENCOUNTER — Encounter (HOSPITAL_COMMUNITY): Payer: Self-pay | Admitting: Emergency Medicine

## 2018-11-28 ENCOUNTER — Emergency Department (HOSPITAL_COMMUNITY)
Admission: EM | Admit: 2018-11-28 | Discharge: 2018-11-29 | Disposition: A | Discharge status: Home / Self Care | Payer: Medicaid Other | Attending: Emergency Medicine | Admitting: Emergency Medicine

## 2018-11-28 DIAGNOSIS — Z720 Tobacco use: Secondary | ICD-10-CM | POA: Diagnosis not present

## 2018-11-28 DIAGNOSIS — R1084 Generalized abdominal pain: Secondary | ICD-10-CM

## 2018-11-28 DIAGNOSIS — Z79899 Other long term (current) drug therapy: Secondary | ICD-10-CM | POA: Diagnosis not present

## 2018-11-28 DIAGNOSIS — I10 Essential (primary) hypertension: Secondary | ICD-10-CM | POA: Diagnosis not present

## 2018-11-28 DIAGNOSIS — R112 Nausea with vomiting, unspecified: Secondary | ICD-10-CM | POA: Diagnosis not present

## 2018-11-28 DIAGNOSIS — K7030 Alcoholic cirrhosis of liver without ascites: Secondary | ICD-10-CM | POA: Diagnosis not present

## 2018-11-28 DIAGNOSIS — F1290 Cannabis use, unspecified, uncomplicated: Secondary | ICD-10-CM | POA: Diagnosis not present

## 2018-11-28 DIAGNOSIS — G8929 Other chronic pain: Secondary | ICD-10-CM | POA: Diagnosis not present

## 2018-11-28 DIAGNOSIS — M549 Dorsalgia, unspecified: Secondary | ICD-10-CM | POA: Insufficient documentation

## 2018-11-28 DIAGNOSIS — I252 Old myocardial infarction: Secondary | ICD-10-CM | POA: Insufficient documentation

## 2018-11-28 DIAGNOSIS — Z7982 Long term (current) use of aspirin: Secondary | ICD-10-CM | POA: Diagnosis not present

## 2018-11-28 LAB — CBC
HCT: 34.8 % — ABNORMAL LOW (ref 39.0–52.0)
Hemoglobin: 12.3 g/dL — ABNORMAL LOW (ref 13.0–17.0)
MCH: 33.2 pg (ref 26.0–34.0)
MCHC: 35.3 g/dL (ref 30.0–36.0)
MCV: 93.8 fL (ref 80.0–100.0)
Platelets: 169 10*3/uL (ref 150–400)
RBC: 3.71 MIL/uL — ABNORMAL LOW (ref 4.22–5.81)
RDW: 17.4 % — ABNORMAL HIGH (ref 11.5–15.5)
WBC: 4.1 10*3/uL (ref 4.0–10.5)
nRBC: 0 % (ref 0.0–0.2)

## 2018-11-28 LAB — COMPREHENSIVE METABOLIC PANEL
ALT: 117 U/L — ABNORMAL HIGH (ref 0–44)
AST: 306 U/L — ABNORMAL HIGH (ref 15–41)
Albumin: 3.3 g/dL — ABNORMAL LOW (ref 3.5–5.0)
Alkaline Phosphatase: 445 U/L — ABNORMAL HIGH (ref 38–126)
Anion gap: 16 — ABNORMAL HIGH (ref 5–15)
BUN: 11 mg/dL (ref 6–20)
CO2: 17 mmol/L — ABNORMAL LOW (ref 22–32)
Calcium: 8.8 mg/dL — ABNORMAL LOW (ref 8.9–10.3)
Chloride: 98 mmol/L (ref 98–111)
Creatinine, Ser: 0.79 mg/dL (ref 0.61–1.24)
GFR calc Af Amer: >60 mL/min (ref >60)
GFR calc non Af Amer: >60 mL/min (ref >60)
Glucose, Bld: 90 mg/dL (ref 70–99)
Potassium: 4 mmol/L (ref 3.5–5.1)
Sodium: 131 mmol/L — ABNORMAL LOW (ref 135–145)
Total Bilirubin: 4.1 mg/dL — ABNORMAL HIGH (ref 0.3–1.2)
Total Protein: 7.9 g/dL (ref 6.5–8.1)

## 2018-11-28 LAB — LIPASE, BLOOD: Lipase: 59 U/L — ABNORMAL HIGH (ref 11–51)

## 2018-11-28 LAB — URINALYSIS, ROUTINE W REFLEX MICROSCOPIC
Bilirubin Urine: NEGATIVE
Glucose, UA: NEGATIVE mg/dL
Hgb urine dipstick: NEGATIVE
Ketones, ur: NEGATIVE mg/dL
Leukocytes,Ua: NEGATIVE
Nitrite: NEGATIVE
Protein, ur: NEGATIVE mg/dL
Specific Gravity, Urine: 1.018 (ref 1.005–1.030)
pH: 5 (ref 5.0–8.0)

## 2018-11-28 LAB — POC OCCULT BLOOD, ED: Fecal Occult Bld: NEGATIVE

## 2018-11-28 MED ORDER — IBUPROFEN 400 MG PO TABS
400.0000 mg | ORAL_TABLET | Freq: Once | ORAL | Status: AC
  Administered 2018-11-28: 400 mg via ORAL
  Filled 2018-11-28: qty 1

## 2018-11-28 MED ORDER — SODIUM CHLORIDE 0.9 % IV BOLUS (SEPSIS)
1000.0000 mL | Freq: Once | INTRAVENOUS | Status: AC
  Administered 2018-11-29: 1000 mL via INTRAVENOUS

## 2018-11-28 NOTE — ED Triage Notes (Signed)
Pt reports abdominal pain for last hour. Pt endorses feeling lightheaded, dizziness, nausea, vomiting, and diarrhea. Pt denies a fever.
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
## 2018-11-29 NOTE — Discharge Instructions (Addendum)
Please call your primary care doctor today at 8:30 AM. Please avoid all alcohol and Tylenol products.  Your liver tests are getting worse.  Please follow-up as soon as possible

## 2018-12-14 ENCOUNTER — Encounter (HOSPITAL_COMMUNITY): Payer: Self-pay | Admitting: *Deleted

## 2018-12-14 ENCOUNTER — Emergency Department (HOSPITAL_COMMUNITY)
Admission: EM | Admit: 2018-12-14 | Discharge: 2018-12-15 | Disposition: A | Discharge status: Home / Self Care | Payer: Medicaid Other | Attending: Emergency Medicine | Admitting: Emergency Medicine

## 2018-12-14 ENCOUNTER — Other Ambulatory Visit: Payer: Self-pay

## 2018-12-14 DIAGNOSIS — R1013 Epigastric pain: Secondary | ICD-10-CM

## 2018-12-14 DIAGNOSIS — K2920 Alcoholic gastritis without bleeding: Secondary | ICD-10-CM | POA: Diagnosis not present

## 2018-12-14 DIAGNOSIS — Z79899 Other long term (current) drug therapy: Secondary | ICD-10-CM | POA: Diagnosis not present

## 2018-12-14 DIAGNOSIS — F17210 Nicotine dependence, cigarettes, uncomplicated: Secondary | ICD-10-CM | POA: Diagnosis not present

## 2018-12-14 DIAGNOSIS — K7010 Alcoholic hepatitis without ascites: Secondary | ICD-10-CM | POA: Insufficient documentation

## 2018-12-14 DIAGNOSIS — Z7982 Long term (current) use of aspirin: Secondary | ICD-10-CM | POA: Insufficient documentation

## 2018-12-14 DIAGNOSIS — R1010 Upper abdominal pain, unspecified: Secondary | ICD-10-CM | POA: Diagnosis present

## 2018-12-14 DIAGNOSIS — I10 Essential (primary) hypertension: Secondary | ICD-10-CM | POA: Diagnosis not present

## 2018-12-14 LAB — COMPREHENSIVE METABOLIC PANEL
ALT: 82 U/L — ABNORMAL HIGH (ref 0–44)
AST: 201 U/L — ABNORMAL HIGH (ref 15–41)
Albumin: 3.2 g/dL — ABNORMAL LOW (ref 3.5–5.0)
Alkaline Phosphatase: 552 U/L — ABNORMAL HIGH (ref 38–126)
Anion gap: 14 (ref 5–15)
BUN: 12 mg/dL (ref 6–20)
CO2: 19 mmol/L — ABNORMAL LOW (ref 22–32)
Calcium: 8.7 mg/dL — ABNORMAL LOW (ref 8.9–10.3)
Chloride: 100 mmol/L (ref 98–111)
Creatinine, Ser: 0.8 mg/dL (ref 0.61–1.24)
GFR calc Af Amer: >60 mL/min (ref >60)
GFR calc non Af Amer: >60 mL/min (ref >60)
Glucose, Bld: 101 mg/dL — ABNORMAL HIGH (ref 70–99)
Potassium: 3.9 mmol/L (ref 3.5–5.1)
Sodium: 133 mmol/L — ABNORMAL LOW (ref 135–145)
Total Bilirubin: 5 mg/dL — ABNORMAL HIGH (ref 0.3–1.2)
Total Protein: 8.6 g/dL — ABNORMAL HIGH (ref 6.5–8.1)

## 2018-12-14 LAB — CBC
HCT: 35.4 % — ABNORMAL LOW (ref 39.0–52.0)
Hemoglobin: 11.4 g/dL — ABNORMAL LOW (ref 13.0–17.0)
MCH: 32.8 pg (ref 26.0–34.0)
MCHC: 32.2 g/dL (ref 30.0–36.0)
MCV: 101.7 fL — ABNORMAL HIGH (ref 80.0–100.0)
Platelets: 205 10*3/uL (ref 150–400)
RBC: 3.48 MIL/uL — ABNORMAL LOW (ref 4.22–5.81)
RDW: 18.4 % — ABNORMAL HIGH (ref 11.5–15.5)
WBC: 6.5 10*3/uL (ref 4.0–10.5)
nRBC: 0.3 % — ABNORMAL HIGH (ref 0.0–0.2)

## 2018-12-14 LAB — LIPASE, BLOOD: Lipase: 46 U/L (ref 11–51)

## 2018-12-14 MED ORDER — ONDANSETRON 4 MG PO TBDP
4.0000 mg | ORAL_TABLET | Freq: Once | ORAL | Status: AC
  Administered 2018-12-14: 4 mg via ORAL
  Filled 2018-12-14: qty 1

## 2018-12-14 MED ORDER — FAMOTIDINE 40 MG/5ML PO SUSR
20.0000 mg | Freq: Once | ORAL | Status: AC
  Administered 2018-12-14: 20 mg via ORAL
  Filled 2018-12-14: qty 2.5

## 2018-12-14 MED ORDER — FAMOTIDINE 20 MG PO TABS: 20.0000 mg | ORAL_TABLET | Freq: Two times a day (BID) | ORAL | 0 refills | Status: DC

## 2018-12-14 MED ORDER — SODIUM CHLORIDE 0.9% FLUSH: 3.0000 mL | Freq: Once | INTRAVENOUS | Status: DC

## 2018-12-14 MED ORDER — ALUM & MAG HYDROXIDE-SIMETH 200-200-20 MG/5ML PO SUSP
30.0000 mL | Freq: Once | ORAL | Status: AC
  Administered 2018-12-14: 30 mL via ORAL
  Filled 2018-12-14: qty 30

## 2018-12-14 NOTE — ED Triage Notes (Signed)
abd pain  With n and v for a long time  He is not answering questions asked
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
## 2019-01-13 ENCOUNTER — Emergency Department (HOSPITAL_COMMUNITY)
Admission: EM | Admit: 2019-01-13 | Discharge: 2019-01-13 | Disposition: A | Discharge status: Home / Self Care | Payer: Medicaid Other | Attending: Emergency Medicine | Admitting: Emergency Medicine

## 2019-01-13 ENCOUNTER — Other Ambulatory Visit: Payer: Self-pay

## 2019-01-13 ENCOUNTER — Encounter (HOSPITAL_COMMUNITY): Payer: Self-pay

## 2019-01-13 DIAGNOSIS — I10 Essential (primary) hypertension: Secondary | ICD-10-CM | POA: Insufficient documentation

## 2019-01-13 DIAGNOSIS — K2920 Alcoholic gastritis without bleeding: Secondary | ICD-10-CM

## 2019-01-13 DIAGNOSIS — F17210 Nicotine dependence, cigarettes, uncomplicated: Secondary | ICD-10-CM | POA: Diagnosis not present

## 2019-01-13 DIAGNOSIS — Z79899 Other long term (current) drug therapy: Secondary | ICD-10-CM | POA: Diagnosis not present

## 2019-01-13 DIAGNOSIS — Z7982 Long term (current) use of aspirin: Secondary | ICD-10-CM | POA: Diagnosis not present

## 2019-01-13 DIAGNOSIS — R1013 Epigastric pain: Secondary | ICD-10-CM | POA: Diagnosis present

## 2019-01-13 LAB — CBC
HCT: 26.3 % — ABNORMAL LOW (ref 39.0–52.0)
Hemoglobin: 8.7 g/dL — ABNORMAL LOW (ref 13.0–17.0)
MCH: 36 pg — ABNORMAL HIGH (ref 26.0–34.0)
MCHC: 33.1 g/dL (ref 30.0–36.0)
MCV: 108.7 fL — ABNORMAL HIGH (ref 80.0–100.0)
Platelets: 135 10*3/uL — ABNORMAL LOW (ref 150–400)
RBC: 2.42 MIL/uL — ABNORMAL LOW (ref 4.22–5.81)
RDW: 18 % — ABNORMAL HIGH (ref 11.5–15.5)
WBC: 4.3 10*3/uL (ref 4.0–10.5)
nRBC: 0 % (ref 0.0–0.2)

## 2019-01-13 LAB — COMPREHENSIVE METABOLIC PANEL
ALT: 74 U/L — ABNORMAL HIGH (ref 0–44)
AST: 263 U/L — ABNORMAL HIGH (ref 15–41)
Albumin: 2.7 g/dL — ABNORMAL LOW (ref 3.5–5.0)
Alkaline Phosphatase: 508 U/L — ABNORMAL HIGH (ref 38–126)
Anion gap: 14 (ref 5–15)
BUN: 11 mg/dL (ref 6–20)
CO2: 20 mmol/L — ABNORMAL LOW (ref 22–32)
Calcium: 8.8 mg/dL — ABNORMAL LOW (ref 8.9–10.3)
Chloride: 98 mmol/L (ref 98–111)
Creatinine, Ser: 0.74 mg/dL (ref 0.61–1.24)
GFR calc Af Amer: >60 mL/min (ref >60)
GFR calc non Af Amer: >60 mL/min (ref >60)
Glucose, Bld: 92 mg/dL (ref 70–99)
Potassium: 3.9 mmol/L (ref 3.5–5.1)
Sodium: 132 mmol/L — ABNORMAL LOW (ref 135–145)
Total Bilirubin: 5.3 mg/dL — ABNORMAL HIGH (ref 0.3–1.2)
Total Protein: 6.7 g/dL (ref 6.5–8.1)

## 2019-01-13 LAB — LIPASE, BLOOD: Lipase: 46 U/L (ref 11–51)

## 2019-01-13 MED ORDER — LIDOCAINE VISCOUS HCL 2 % MT SOLN
15.0000 mL | Freq: Once | OROMUCOSAL | Status: AC
  Administered 2019-01-13: 15 mL via ORAL
  Filled 2019-01-13: qty 15

## 2019-01-13 MED ORDER — ALUM & MAG HYDROXIDE-SIMETH 200-200-20 MG/5ML PO SUSP
30.0000 mL | Freq: Once | ORAL | Status: AC
  Administered 2019-01-13: 30 mL via ORAL
  Filled 2019-01-13: qty 30

## 2019-01-13 MED ORDER — PANTOPRAZOLE SODIUM 40 MG PO TBEC: 40.0000 mg | DELAYED_RELEASE_TABLET | Freq: Two times a day (BID) | ORAL | 0 refills | Status: DC

## 2019-01-13 MED ORDER — SODIUM CHLORIDE 0.9 % IV BOLUS
1000.0000 mL | Freq: Once | INTRAVENOUS | Status: AC
  Administered 2019-01-13: 06:00:00 1000 mL via INTRAVENOUS

## 2019-01-13 MED ORDER — FAMOTIDINE 20 MG PO TABS: 20.0000 mg | ORAL_TABLET | Freq: Two times a day (BID) | ORAL | 0 refills | Status: DC

## 2019-01-13 MED ORDER — KETOROLAC TROMETHAMINE 30 MG/ML IJ SOLN
30.0000 mg | Freq: Once | INTRAMUSCULAR | Status: AC
  Administered 2019-01-13: 30 mg via INTRAVENOUS
  Filled 2019-01-13: qty 1

## 2019-01-13 MED ORDER — DICYCLOMINE HCL 20 MG PO TABS: 20.0000 mg | ORAL_TABLET | Freq: Three times a day (TID) | ORAL | 0 refills | Status: DC
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
## 2019-01-13 NOTE — ED Triage Notes (Addendum)
Pt BIB [facility] from home. Pt c/o abdominal pain and nausea that began this morning with 2 episodes of vomiting and lightheadness. Pt has hx of cirrhosis. Pt stated he drank beer last night.   Pt denies COVID exposure.

## 2019-01-13 NOTE — Discharge Instructions (Signed)
Continue medications as previously prescribed.  Your reflux medications have been refilled today.  You are to follow-up with your primary doctor if symptoms or not improving in the next few days.

## 2019-01-15 ENCOUNTER — Emergency Department (HOSPITAL_COMMUNITY)
Admission: EM | Admit: 2019-01-15 | Discharge: 2019-01-16 | Disposition: A | Discharge status: Home / Self Care | Payer: Medicaid Other | Attending: Emergency Medicine | Admitting: Emergency Medicine

## 2019-01-15 ENCOUNTER — Emergency Department (HOSPITAL_COMMUNITY): Payer: Medicaid Other

## 2019-01-15 ENCOUNTER — Other Ambulatory Visit: Payer: Self-pay

## 2019-01-15 DIAGNOSIS — Z7982 Long term (current) use of aspirin: Secondary | ICD-10-CM | POA: Insufficient documentation

## 2019-01-15 DIAGNOSIS — Z79899 Other long term (current) drug therapy: Secondary | ICD-10-CM | POA: Insufficient documentation

## 2019-01-15 DIAGNOSIS — R079 Chest pain, unspecified: Secondary | ICD-10-CM | POA: Diagnosis not present

## 2019-01-15 DIAGNOSIS — F17210 Nicotine dependence, cigarettes, uncomplicated: Secondary | ICD-10-CM | POA: Insufficient documentation

## 2019-01-15 DIAGNOSIS — R1084 Generalized abdominal pain: Secondary | ICD-10-CM | POA: Insufficient documentation

## 2019-01-15 DIAGNOSIS — R109 Unspecified abdominal pain: Secondary | ICD-10-CM | POA: Diagnosis present

## 2019-01-15 DIAGNOSIS — I10 Essential (primary) hypertension: Secondary | ICD-10-CM | POA: Diagnosis not present

## 2019-01-15 LAB — BASIC METABOLIC PANEL
Anion gap: 15 (ref 5–15)
BUN: 13 mg/dL (ref 6–20)
CO2: 18 mmol/L — ABNORMAL LOW (ref 22–32)
Calcium: 8.1 mg/dL — ABNORMAL LOW (ref 8.9–10.3)
Chloride: 101 mmol/L (ref 98–111)
Creatinine, Ser: 1.64 mg/dL — ABNORMAL HIGH (ref 0.61–1.24)
GFR calc Af Amer: 53 mL/min — ABNORMAL LOW (ref >60)
GFR calc non Af Amer: 46 mL/min — ABNORMAL LOW (ref >60)
Glucose, Bld: 76 mg/dL (ref 70–99)
Potassium: 3.2 mmol/L — ABNORMAL LOW (ref 3.5–5.1)
Sodium: 134 mmol/L — ABNORMAL LOW (ref 135–145)

## 2019-01-15 LAB — CBC
HCT: 28 % — ABNORMAL LOW (ref 39.0–52.0)
Hemoglobin: 9 g/dL — ABNORMAL LOW (ref 13.0–17.0)
MCH: 35.4 pg — ABNORMAL HIGH (ref 26.0–34.0)
MCHC: 32.1 g/dL (ref 30.0–36.0)
MCV: 110.2 fL — ABNORMAL HIGH (ref 80.0–100.0)
Platelets: 137 10*3/uL — ABNORMAL LOW (ref 150–400)
RBC: 2.54 MIL/uL — ABNORMAL LOW (ref 4.22–5.81)
RDW: 17.7 % — ABNORMAL HIGH (ref 11.5–15.5)
WBC: 5.9 10*3/uL (ref 4.0–10.5)
nRBC: 0.5 % — ABNORMAL HIGH (ref 0.0–0.2)

## 2019-01-15 LAB — TROPONIN I (HIGH SENSITIVITY)
Troponin I (High Sensitivity): 32 ng/L — ABNORMAL HIGH (ref <18)
Troponin I (High Sensitivity): 33 ng/L — ABNORMAL HIGH (ref <18)

## 2019-01-15 MED ORDER — SODIUM CHLORIDE 0.9% FLUSH: 3.0000 mL | Freq: Once | INTRAVENOUS | Status: DC

## 2019-01-15 NOTE — ED Triage Notes (Signed)
Pt presents with abd, chest pain and N/V x1 hour HR 90, BP 94/60, RR 16, 97% RA CBG 72

## 2019-01-16 LAB — HEPATIC FUNCTION PANEL
ALT: 79 U/L — ABNORMAL HIGH (ref 0–44)
AST: 254 U/L — ABNORMAL HIGH (ref 15–41)
Albumin: 2.6 g/dL — ABNORMAL LOW (ref 3.5–5.0)
Alkaline Phosphatase: 447 U/L — ABNORMAL HIGH (ref 38–126)
Bilirubin, Direct: 2.6 mg/dL — ABNORMAL HIGH (ref 0.0–0.2)
Indirect Bilirubin: 2.4 mg/dL — ABNORMAL HIGH (ref 0.3–0.9)
Total Bilirubin: 5 mg/dL — ABNORMAL HIGH (ref 0.3–1.2)
Total Protein: 7.4 g/dL (ref 6.5–8.1)

## 2019-01-16 LAB — LIPASE, BLOOD: Lipase: 43 U/L (ref 11–51)

## 2019-01-16 MED ORDER — LIDOCAINE VISCOUS HCL 2 % MT SOLN
15.0000 mL | Freq: Once | OROMUCOSAL | Status: AC
  Administered 2019-01-16: 15 mL via ORAL
  Filled 2019-01-16: qty 15

## 2019-01-16 MED ORDER — ALUM & MAG HYDROXIDE-SIMETH 200-200-20 MG/5ML PO SUSP
30.0000 mL | Freq: Once | ORAL | Status: AC
  Administered 2019-01-16: 06:00:00 30 mL via ORAL
  Filled 2019-01-16: qty 30

## 2019-01-16 MED ORDER — HYOSCYAMINE SULFATE 0.125 MG SL SUBL
0.2500 mg | SUBLINGUAL_TABLET | Freq: Once | SUBLINGUAL | Status: AC
  Administered 2019-01-16: 0.25 mg via SUBLINGUAL
  Filled 2019-01-16: qty 2

## 2019-01-16 MED ORDER — FAMOTIDINE 20 MG PO TABS
40.0000 mg | ORAL_TABLET | Freq: Once | ORAL | Status: AC
  Administered 2019-01-16: 06:00:00 40 mg via ORAL
  Filled 2019-01-16: qty 2
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
## 2019-01-16 NOTE — ED Notes (Signed)
Lab to add Lipase and Hepatic function to previous collection

## 2019-01-16 NOTE — ED Notes (Signed)
Discharge instructions discussed with pt. Pt verbalized understanding with no questions at this time.  

## 2019-01-18 ENCOUNTER — Other Ambulatory Visit: Payer: Self-pay

## 2019-01-18 ENCOUNTER — Emergency Department (HOSPITAL_COMMUNITY)
Admission: EM | Admit: 2019-01-18 | Discharge: 2019-01-19 | Disposition: A | Discharge status: Home / Self Care | Payer: Medicaid Other | Attending: Emergency Medicine | Admitting: Emergency Medicine

## 2019-01-18 ENCOUNTER — Encounter (HOSPITAL_COMMUNITY): Payer: Self-pay | Admitting: *Deleted

## 2019-01-18 DIAGNOSIS — R110 Nausea: Secondary | ICD-10-CM | POA: Insufficient documentation

## 2019-01-18 DIAGNOSIS — I10 Essential (primary) hypertension: Secondary | ICD-10-CM | POA: Diagnosis not present

## 2019-01-18 DIAGNOSIS — Z79899 Other long term (current) drug therapy: Secondary | ICD-10-CM | POA: Diagnosis not present

## 2019-01-18 DIAGNOSIS — I252 Old myocardial infarction: Secondary | ICD-10-CM | POA: Diagnosis not present

## 2019-01-18 DIAGNOSIS — F1290 Cannabis use, unspecified, uncomplicated: Secondary | ICD-10-CM | POA: Insufficient documentation

## 2019-01-18 DIAGNOSIS — G8929 Other chronic pain: Secondary | ICD-10-CM | POA: Diagnosis not present

## 2019-01-18 DIAGNOSIS — K2920 Alcoholic gastritis without bleeding: Secondary | ICD-10-CM | POA: Diagnosis not present

## 2019-01-18 DIAGNOSIS — F17210 Nicotine dependence, cigarettes, uncomplicated: Secondary | ICD-10-CM | POA: Diagnosis not present

## 2019-01-18 DIAGNOSIS — R109 Unspecified abdominal pain: Secondary | ICD-10-CM

## 2019-01-18 DIAGNOSIS — F1010 Alcohol abuse, uncomplicated: Secondary | ICD-10-CM | POA: Diagnosis not present

## 2019-01-18 DIAGNOSIS — Z7982 Long term (current) use of aspirin: Secondary | ICD-10-CM | POA: Insufficient documentation

## 2019-01-18 DIAGNOSIS — R1084 Generalized abdominal pain: Secondary | ICD-10-CM | POA: Diagnosis present

## 2019-01-18 LAB — COMPREHENSIVE METABOLIC PANEL
ALT: 95 U/L — ABNORMAL HIGH (ref 0–44)
AST: 305 U/L — ABNORMAL HIGH (ref 15–41)
Albumin: 2.7 g/dL — ABNORMAL LOW (ref 3.5–5.0)
Alkaline Phosphatase: 527 U/L — ABNORMAL HIGH (ref 38–126)
Anion gap: 16 — ABNORMAL HIGH (ref 5–15)
BUN: 7 mg/dL (ref 6–20)
CO2: 21 mmol/L — ABNORMAL LOW (ref 22–32)
Calcium: 8.7 mg/dL — ABNORMAL LOW (ref 8.9–10.3)
Chloride: 98 mmol/L (ref 98–111)
Creatinine, Ser: 0.77 mg/dL (ref 0.61–1.24)
GFR calc Af Amer: >60 mL/min (ref >60)
GFR calc non Af Amer: >60 mL/min (ref >60)
Glucose, Bld: 87 mg/dL (ref 70–99)
Potassium: 3 mmol/L — ABNORMAL LOW (ref 3.5–5.1)
Sodium: 135 mmol/L (ref 135–145)
Total Bilirubin: 5.5 mg/dL — ABNORMAL HIGH (ref 0.3–1.2)
Total Protein: 7.6 g/dL (ref 6.5–8.1)

## 2019-01-18 LAB — CBC
HCT: 28.1 % — ABNORMAL LOW (ref 39.0–52.0)
Hemoglobin: 9.4 g/dL — ABNORMAL LOW (ref 13.0–17.0)
MCH: 36.9 pg — ABNORMAL HIGH (ref 26.0–34.0)
MCHC: 33.5 g/dL (ref 30.0–36.0)
MCV: 110.2 fL — ABNORMAL HIGH (ref 80.0–100.0)
Platelets: 114 10*3/uL — ABNORMAL LOW (ref 150–400)
RBC: 2.55 MIL/uL — ABNORMAL LOW (ref 4.22–5.81)
RDW: 17.6 % — ABNORMAL HIGH (ref 11.5–15.5)
WBC: 5.2 10*3/uL (ref 4.0–10.5)
nRBC: 0.6 % — ABNORMAL HIGH (ref 0.0–0.2)

## 2019-01-18 LAB — TROPONIN I (HIGH SENSITIVITY): Troponin I (High Sensitivity): 30 ng/L — ABNORMAL HIGH (ref <18)

## 2019-01-18 LAB — LIPASE, BLOOD: Lipase: 51 U/L (ref 11–51)

## 2019-01-18 MED ORDER — SODIUM CHLORIDE 0.9% FLUSH
3.0000 mL | Freq: Once | INTRAVENOUS | Status: AC
  Administered 2019-01-19: 3 mL via INTRAVENOUS
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
## 2019-01-19 LAB — TROPONIN I (HIGH SENSITIVITY): Troponin I (High Sensitivity): 31 ng/L — ABNORMAL HIGH (ref <18)

## 2019-01-19 MED ORDER — SODIUM CHLORIDE 0.9 % IV BOLUS
1000.0000 mL | Freq: Once | INTRAVENOUS | Status: AC
  Administered 2019-01-19: 09:00:00 1000 mL via INTRAVENOUS

## 2019-01-19 MED ORDER — ONDANSETRON HCL 4 MG/2ML IJ SOLN
4.0000 mg | Freq: Once | INTRAMUSCULAR | Status: AC
  Administered 2019-01-19: 09:00:00 4 mg via INTRAVENOUS
  Filled 2019-01-19: qty 2

## 2019-01-19 MED ORDER — FENTANYL CITRATE (PF) 100 MCG/2ML IJ SOLN
50.0000 ug | Freq: Once | INTRAMUSCULAR | Status: AC
  Administered 2019-01-19: 50 ug via INTRAVENOUS
  Filled 2019-01-19: qty 2

## 2019-01-19 MED ORDER — FAMOTIDINE 20 MG PO TABS: 20.0000 mg | ORAL_TABLET | Freq: Two times a day (BID) | ORAL | 0 refills | Status: DC

## 2019-01-19 MED ORDER — POTASSIUM CHLORIDE CRYS ER 20 MEQ PO TBCR
40.0000 meq | EXTENDED_RELEASE_TABLET | Freq: Once | ORAL | Status: AC
  Administered 2019-01-19: 10:00:00 40 meq via ORAL
  Filled 2019-01-19: qty 2

## 2019-01-19 MED ORDER — ONDANSETRON 4 MG PO TBDP: 4.0000 mg | ORAL_TABLET | Freq: Three times a day (TID) | ORAL | 0 refills | Status: DC | PRN

## 2019-01-19 NOTE — ED Notes (Signed)
Patient verbalizes understanding of discharge instructions. Opportunity for questioning and answers were provided. Armband removed by staff, pt discharged from ED.  

## 2019-01-19 NOTE — Discharge Instructions (Addendum)
Recommend following up with your primary care doctor for recheck later this week.  If you not have a primary care doctor you can call me, health and wellness to try to schedule follow-up.  If you develop fever, worsening pain, vomiting, please return to ER for reassessment.  Can take the prescribed Zofran as needed for further nausea.  Your potassium was slightly low I recommend taking a extra banana daily.  This should also be rechecked by your primary doctor.  Recommend stopping alcohol use entirely as this will likely help improve your symptoms.
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
## 2019-03-06 IMAGING — DX DG CHEST 2V
2 series · 2 of 2 positions shown · non-contrast
Comparison: 10/30/2016

CLINICAL DATA: Dizziness with fever and shortness of breath.

EXAM:
CHEST  2 VIEW

[chest lat]
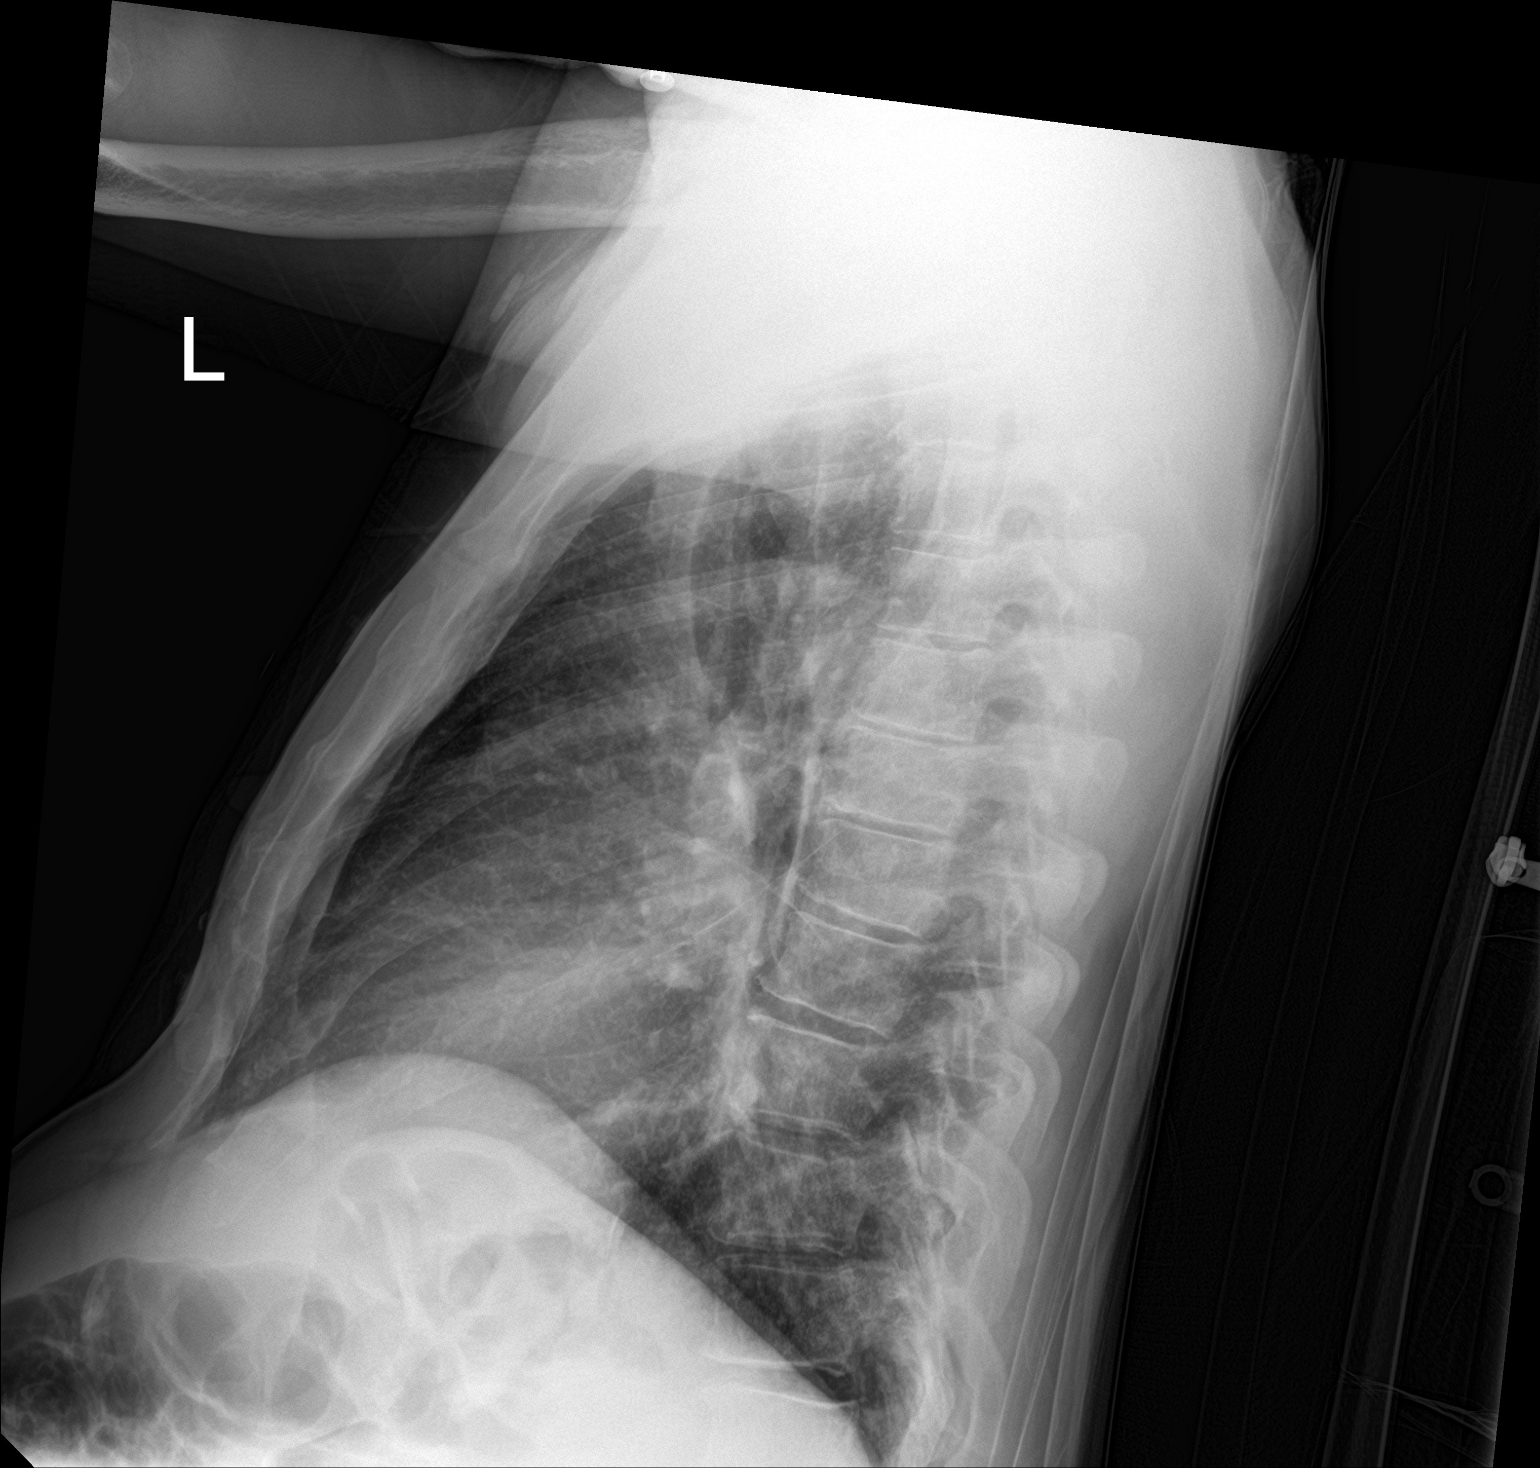

[chest ap]
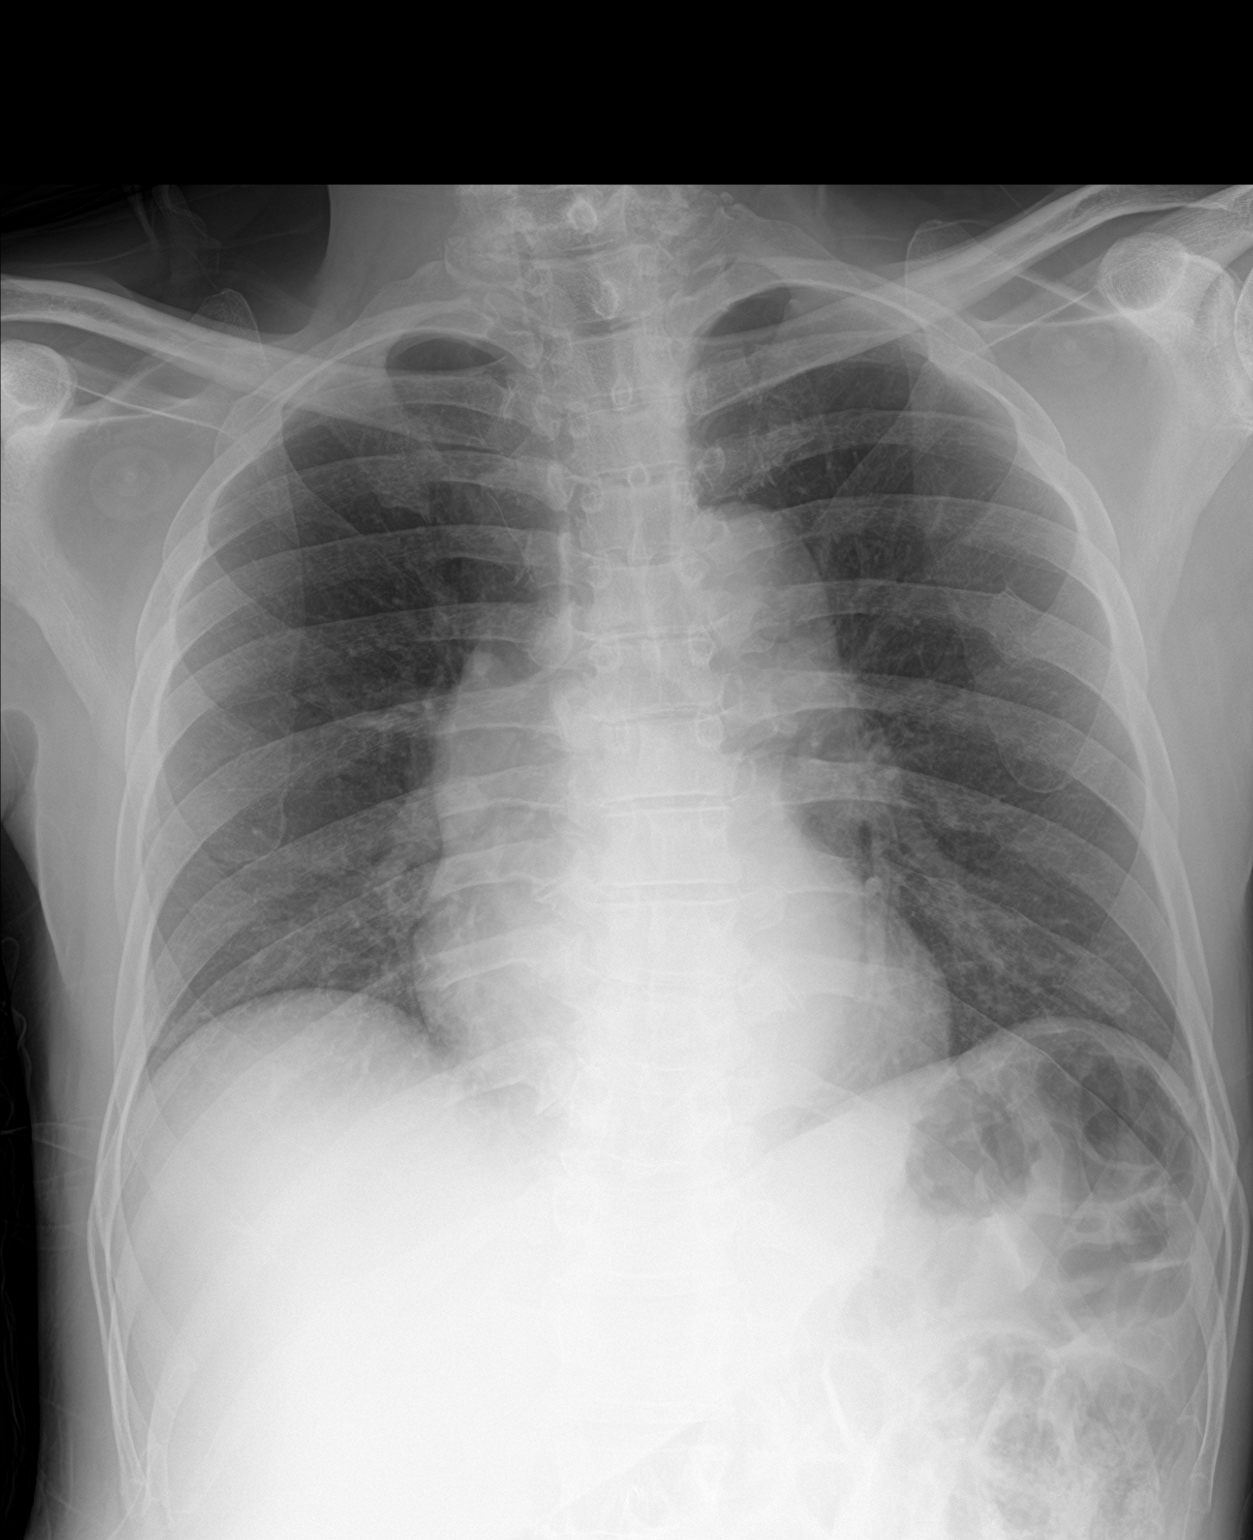

[2 of 2 positions shown; findings below may reference images not displayed]

FINDINGS: AP and lateral views of the chest show no focal airspace
consolidation, pulmonary edema, or pleural effusion.
Cardiopericardial silhouette is at upper limits of normal for size.
Old posterior left rib fracture evident.
IMPRESSION: No acute cardiopulmonary findings.

## 2020-02-16 IMAGING — CT CT ABD-PELV W/ CM
2 of 5 series · 16 of 46 positions shown, 18 images · IV contrast (omnipaque)
Comparison: 07/25/2017 and 03/14/2017. Limited right upper quadrant
abdomen ultrasound dated 07/09/2017..

CLINICAL DATA: Mid abdominal pain, abdominal distention and
vomiting. Previous hernia repair.

EXAM:
CT ABDOMEN AND PELVIS WITH CONTRAST
TECHNIQUE: Multidetector CT imaging of the abdomen and pelvis was performed
using the standard protocol following bolus administration of
intravenous contrast.
CONTRAST:  100mL OMNIPAQUE IOHEXOL 300 MG/ML  SOLN

[Series 3: a/p w/ 5mm · axial · 0.75mm/px · z∈[+1020,+1430]mm · 13 of 92 slices shown, 15 images]
[im 5/92  soft-tissue]
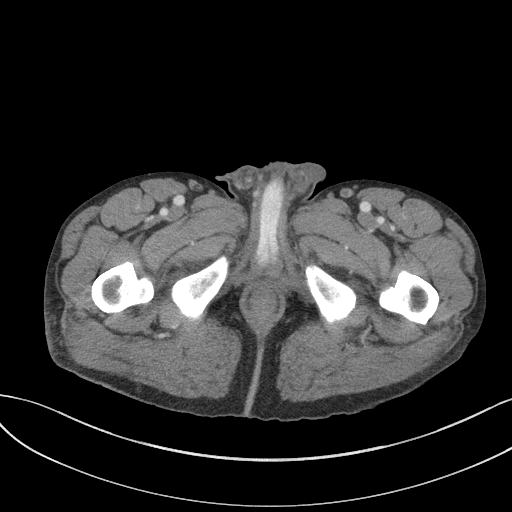
[im 5/92  bone]
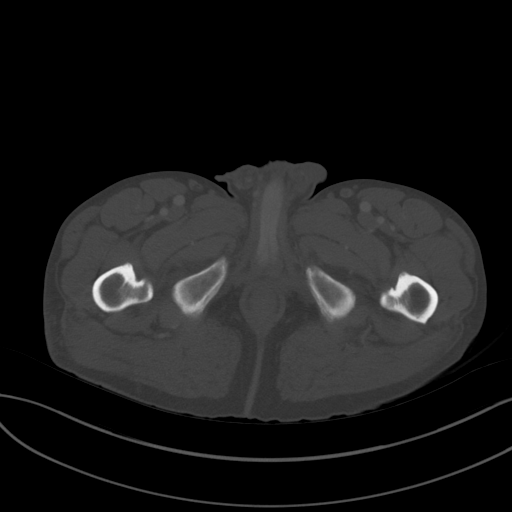
[im 15/92  soft-tissue]
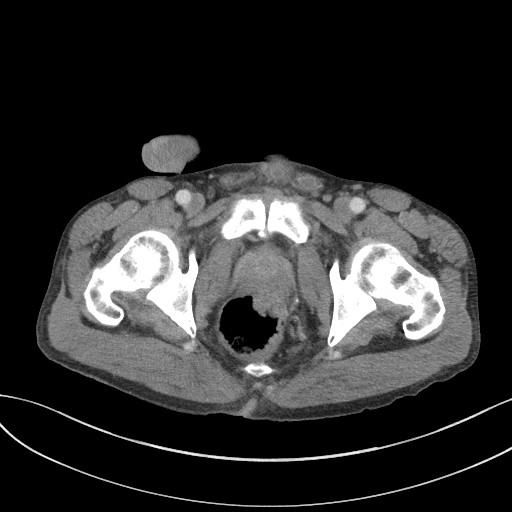
[im 20/92  soft-tissue]
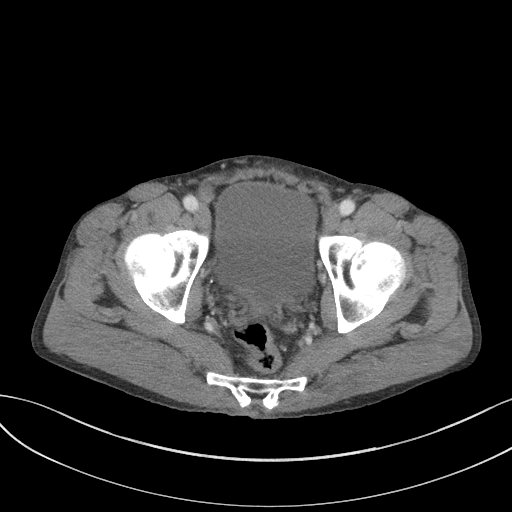
[im 24/92  soft-tissue]
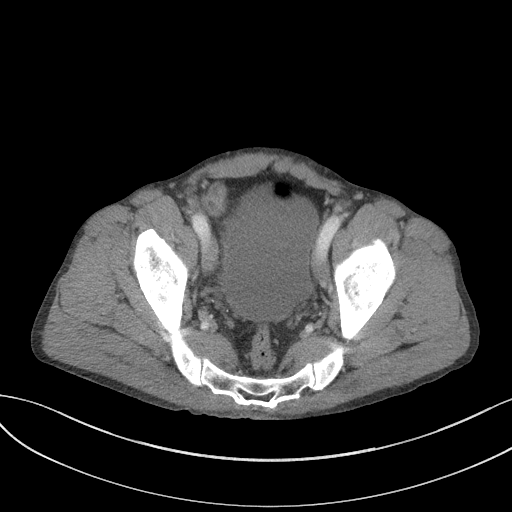
[im 34/92  soft-tissue]
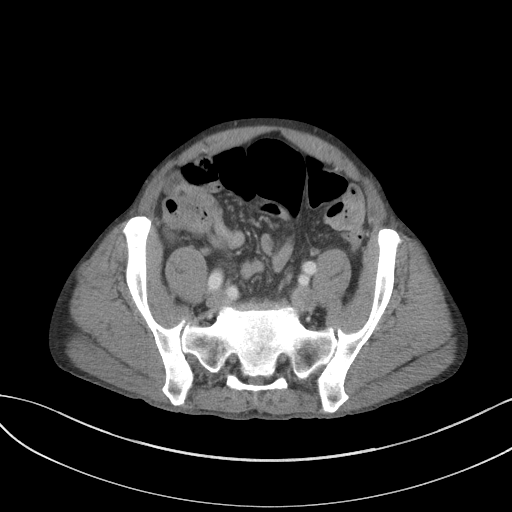
[im 39/92  soft-tissue]
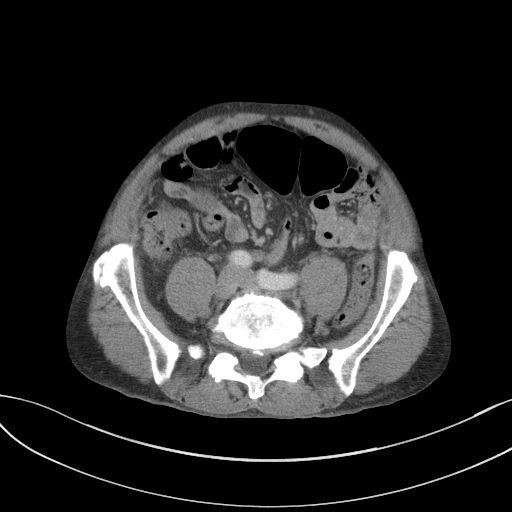
[im 48/92  soft-tissue]
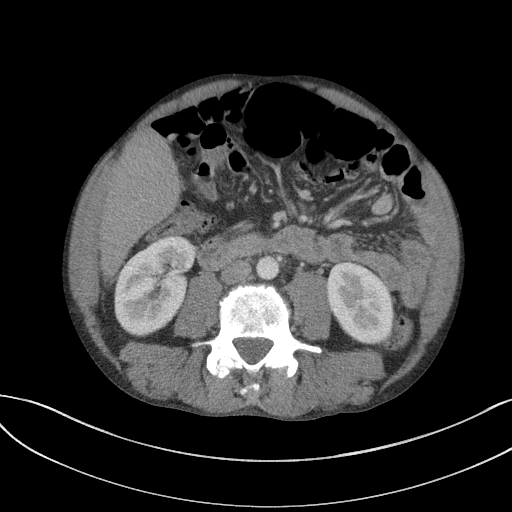
[im 53/92  soft-tissue]
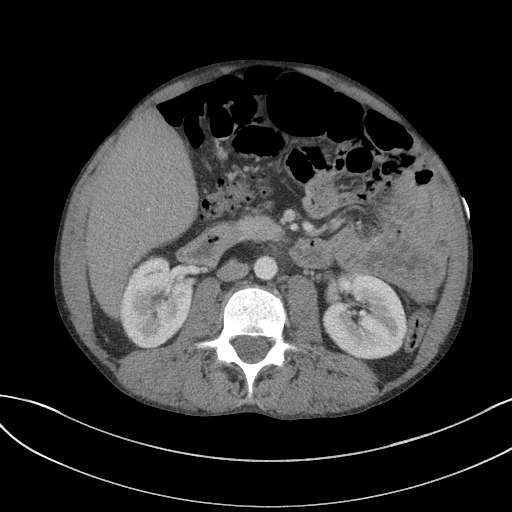
[im 58/92  soft-tissue]
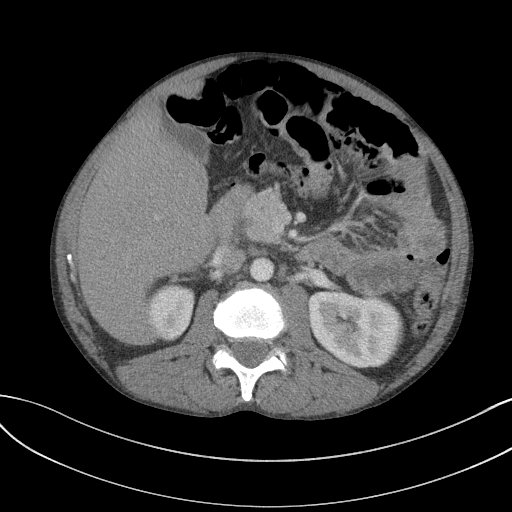
[im 58/92  bone]
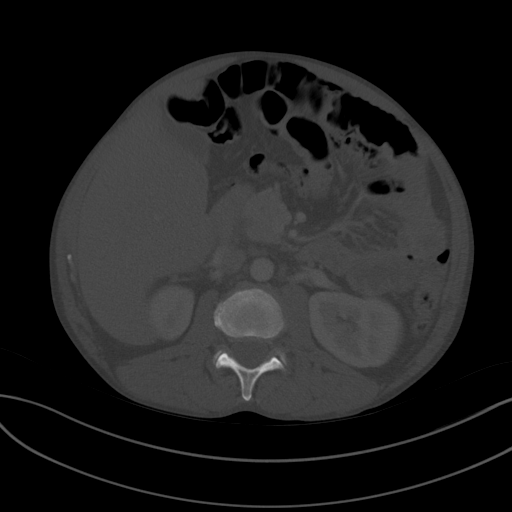
[im 68/92  soft-tissue]
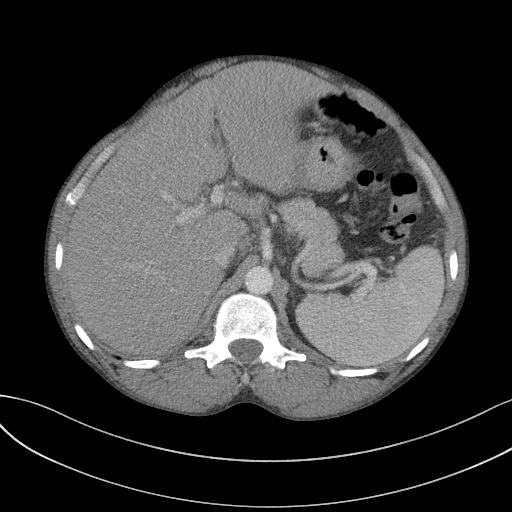
[im 72/92  soft-tissue]
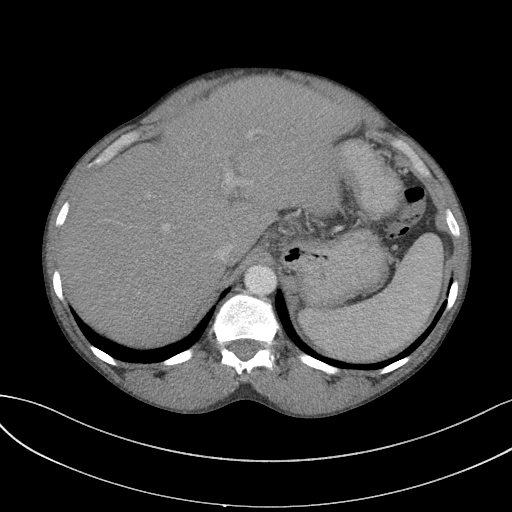
[im 77/92  soft-tissue]
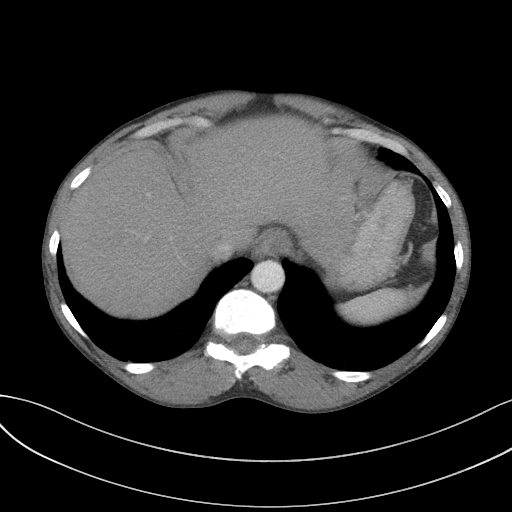
[im 87/92  soft-tissue]
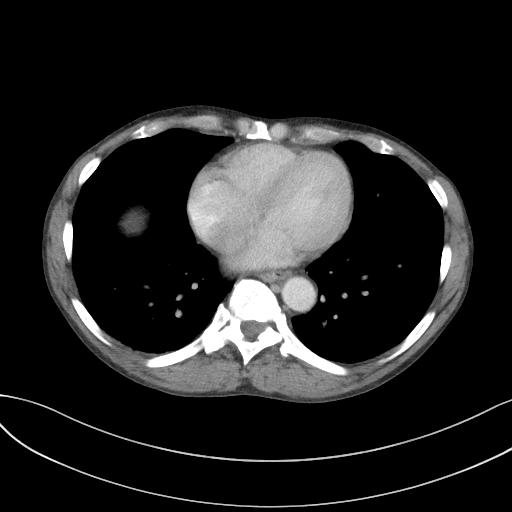

[Series 6: a/p w/ cor · coronal · 0.79mm/px · 3 of 134 slices shown]
[im 45/134  soft-tissue]
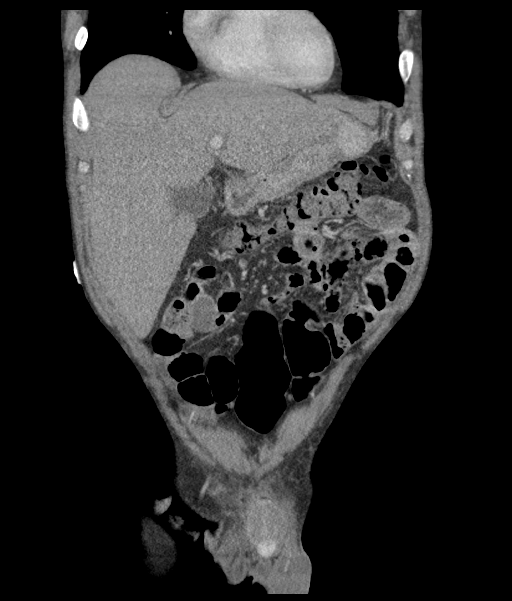
[im 60/134  soft-tissue]
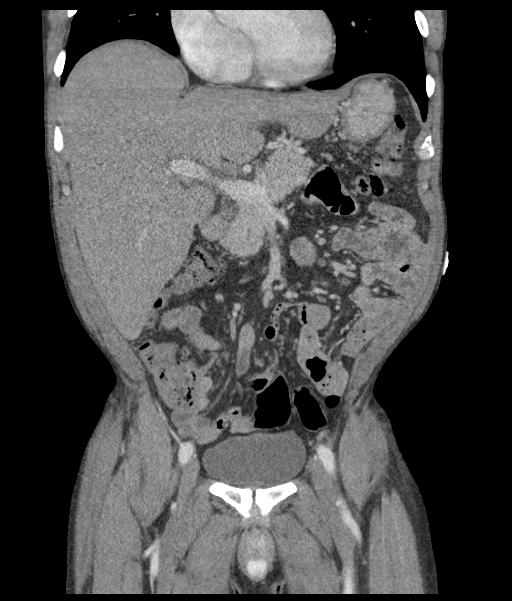
[im 74/134  soft-tissue]
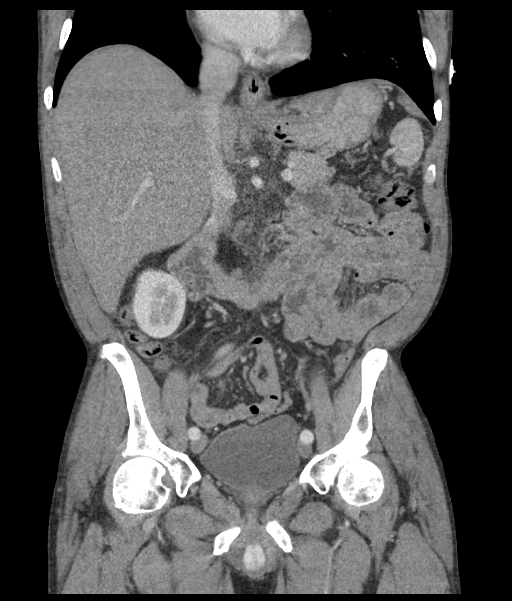

[16 of 46 positions shown; findings below may reference images not displayed]

FINDINGS: Lower chest: Minimal bilateral dependent atelectasis.

Hepatobiliary: Mild diffuse gallbladder wall thickening with poorly
defined margins. No visible gallstones. Small area of focal fat
deposition in the liver adjacent to the gallbladder fossa.

Pancreas: Unremarkable. No pancreatic ductal dilatation or
surrounding inflammatory changes.

Spleen: Normal in size without focal abnormality.

Adrenals/Urinary Tract: Adrenal glands are unremarkable. Kidneys are
normal, without renal calculi, focal lesion, or hydronephrosis.
Bladder is unremarkable.

Stomach/Bowel: Gas distended sigmoid colon and small bowel loops are
demonstrated in the anterior abdomen. The remainder of the colon and
small bowel are unremarkable, as is the stomach. No evidence of
appendicitis.

Vascular/Lymphatic: A chronically prominent portacaval lymph node is
unchanged with a short axis diameter of 18 mm on image number 33
series 3. A chronically prominent porta hepatis lymph node is also
unchanged with a short axis diameter of 12 mm on image number 28
series 3.. Minimal atheromatous arterial calcification.

Reproductive: Mildly enlarged prostate gland.

Other: No abdominal wall hernia or abnormality. No abdominopelvic
ascites.

Musculoskeletal: Lumbar and lower thoracic spine degenerative
changes. Mild thoracolumbar kyphosis.
IMPRESSION: 1. Mildly dilated, gas distended sigmoid colon and small bowel loops
in the anterior abdomen. This could be due to ileus or partial
obstruction associated with an internal hernia.
2. Mild gallbladder wall thickening with ill-defined gallbladder
margins. This could be an indication of acute cholecystitis. If this
is a clinical concern, further evaluation with a right upper
quadrant abdomen ultrasound would be recommended.
3. Chronically prominent portacaval and porta hepatis lymph nodes,
unchanged.

## 2021-01-16 IMAGING — DX CHEST - 2 VIEW
2 series · 2 of 2 positions shown · non-contrast
Comparison: 09/29/2018, multiple priors.

CLINICAL DATA: Chest pain.

EXAM:
CHEST - 2 VIEW

[chest pa]
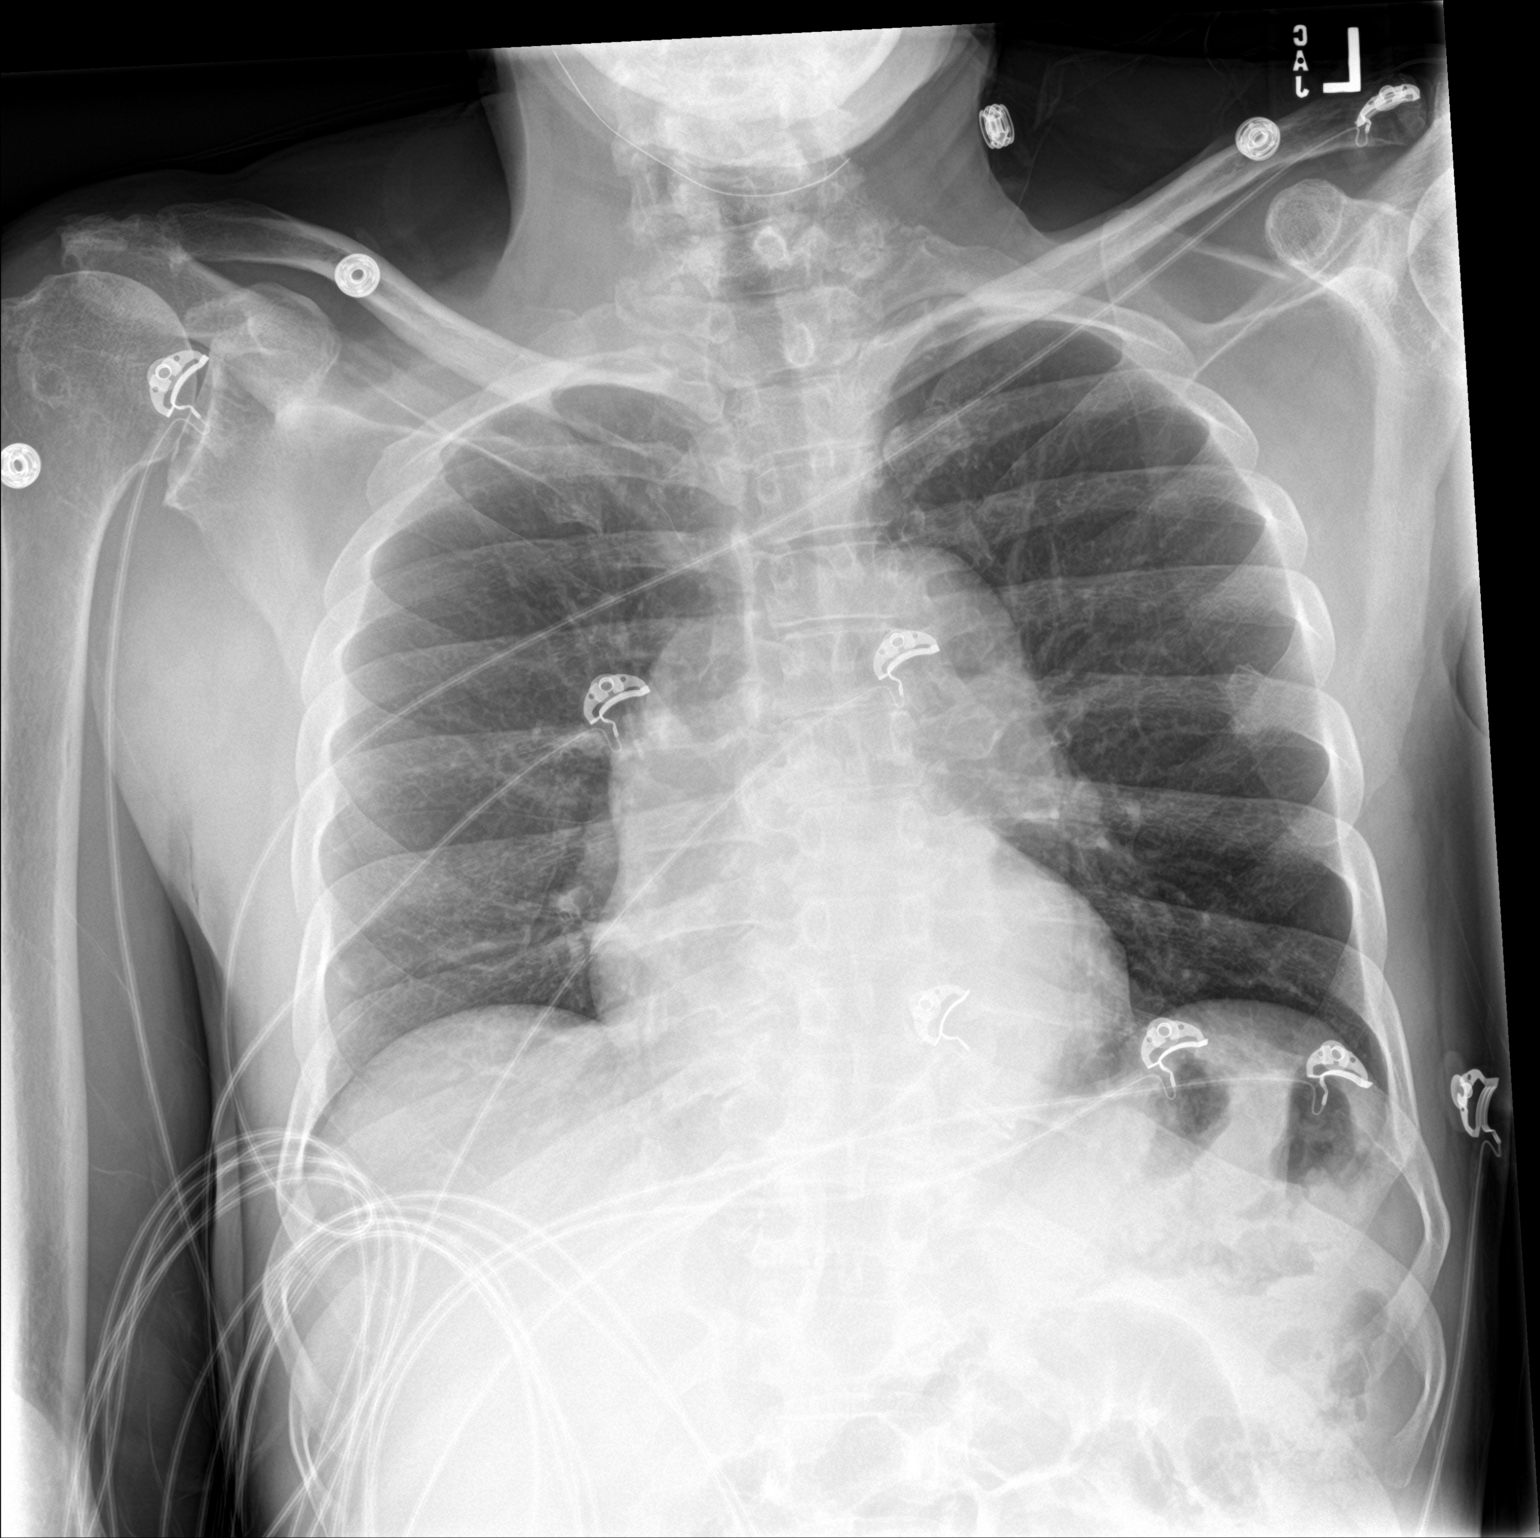

[chest lat]
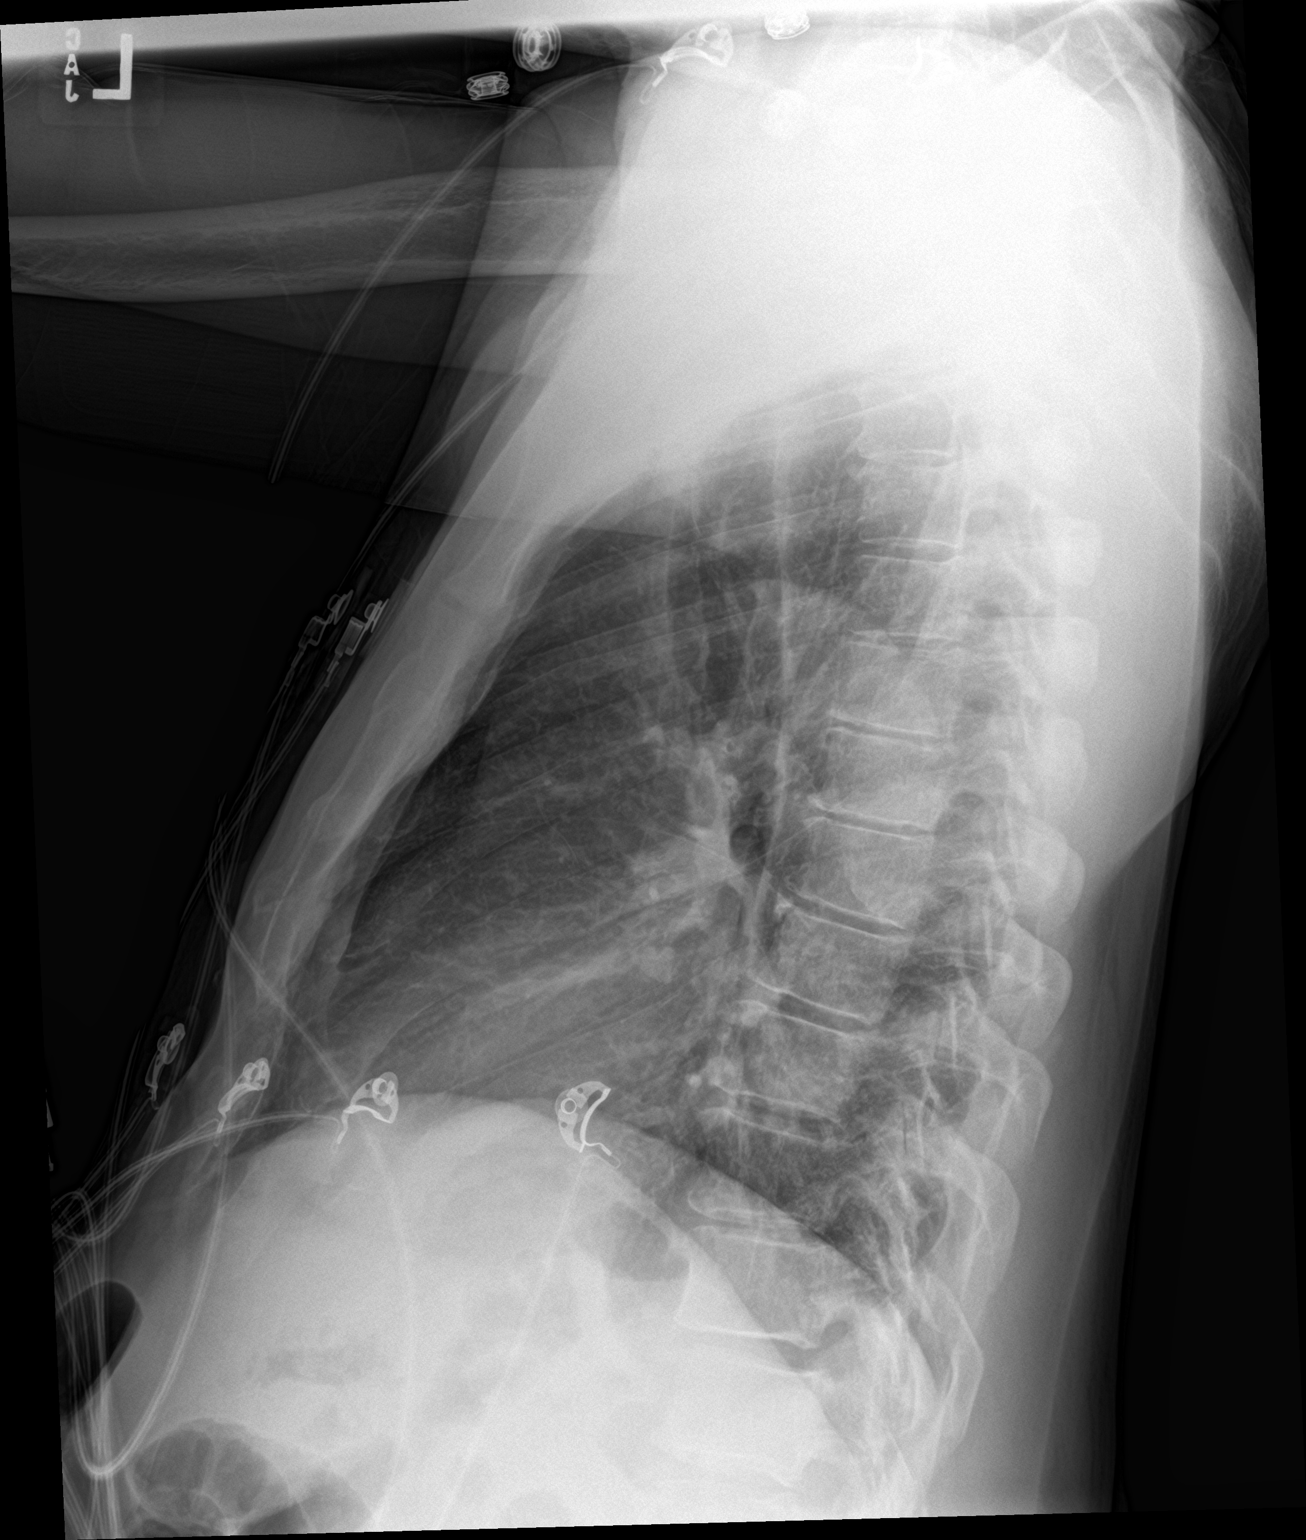

[2 of 2 positions shown; findings below may reference images not displayed]

FINDINGS: Heart is normal in size. Aortic tortuosity as before. No focal
airspace disease, pulmonary edema, pleural effusion or pneumothorax.
Chronic change about the right shoulder. Remote left rib fracture.
IMPRESSION: No acute chest findings.

## 2021-04-01 ENCOUNTER — Other Ambulatory Visit: Payer: Self-pay

## 2021-04-01 ENCOUNTER — Emergency Department (HOSPITAL_COMMUNITY)
Admission: EM | Admit: 2021-04-01 | Discharge: 2021-04-01 | Disposition: A | Discharge status: Home / Self Care | Payer: Medicaid Other | Attending: Emergency Medicine | Admitting: Emergency Medicine

## 2021-04-01 ENCOUNTER — Encounter (HOSPITAL_COMMUNITY): Payer: Self-pay

## 2021-04-01 ENCOUNTER — Emergency Department (HOSPITAL_COMMUNITY): Payer: Medicaid Other

## 2021-04-01 DIAGNOSIS — Z79899 Other long term (current) drug therapy: Secondary | ICD-10-CM | POA: Diagnosis not present

## 2021-04-01 DIAGNOSIS — F17210 Nicotine dependence, cigarettes, uncomplicated: Secondary | ICD-10-CM | POA: Insufficient documentation

## 2021-04-01 DIAGNOSIS — Z7982 Long term (current) use of aspirin: Secondary | ICD-10-CM | POA: Insufficient documentation

## 2021-04-01 DIAGNOSIS — M79605 Pain in left leg: Secondary | ICD-10-CM | POA: Insufficient documentation

## 2021-04-01 DIAGNOSIS — I10 Essential (primary) hypertension: Secondary | ICD-10-CM | POA: Insufficient documentation

## 2021-04-01 LAB — BASIC METABOLIC PANEL
Anion gap: 16 — ABNORMAL HIGH (ref 5–15)
BUN: 23 mg/dL — ABNORMAL HIGH (ref 6–20)
CO2: 17 mmol/L — ABNORMAL LOW (ref 22–32)
Calcium: 8.8 mg/dL — ABNORMAL LOW (ref 8.9–10.3)
Chloride: 100 mmol/L (ref 98–111)
Creatinine, Ser: 1.06 mg/dL (ref 0.61–1.24)
GFR, Estimated: >60 mL/min (ref >60)
Glucose, Bld: 73 mg/dL (ref 70–99)
Potassium: 3.8 mmol/L (ref 3.5–5.1)
Sodium: 133 mmol/L — ABNORMAL LOW (ref 135–145)

## 2021-04-01 LAB — CBC
HCT: 40.6 % (ref 39.0–52.0)
Hemoglobin: 13.3 g/dL (ref 13.0–17.0)
MCH: 32.8 pg (ref 26.0–34.0)
MCHC: 32.8 g/dL (ref 30.0–36.0)
MCV: 100.2 fL — ABNORMAL HIGH (ref 80.0–100.0)
Platelets: 307 10*3/uL (ref 150–400)
RBC: 4.05 MIL/uL — ABNORMAL LOW (ref 4.22–5.81)
RDW: 14.1 % (ref 11.5–15.5)
WBC: 5.2 10*3/uL (ref 4.0–10.5)
nRBC: 0 % (ref 0.0–0.2)

## 2021-04-01 LAB — TROPONIN I (HIGH SENSITIVITY)
Troponin I (High Sensitivity): 11 ng/L (ref <18)
Troponin I (High Sensitivity): 12 ng/L (ref <18)

## 2021-04-01 MED ORDER — IBUPROFEN 800 MG PO TABS: 800.0000 mg | ORAL_TABLET | Freq: Three times a day (TID) | ORAL | 1 refills | Status: DC | PRN
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
## 2021-04-01 NOTE — Discharge Instructions (Signed)
Follow-up with a family doctor if not improving

## 2021-04-01 NOTE — ED Triage Notes (Signed)
Patient c/o left leg pain x months. Patient denies any injury or swelling.

## 2021-04-01 NOTE — ED Provider Notes (Addendum)
Emergency Medicine Provider Triage Evaluation Note  Devin [name] , a 60 y.o. male  was evaluated in triage.  Pt complains of leg left pain for "months" that comes and goes and is described as "aching". Patient states he is seeking medical evaluation today because he fell earlier while walking. Patient unable to explain what caused him to fall. Patient denies trauma to area. Patient also complaining of chest pain and states he has history of MI in 2016. Patient denies chest pain with exertion or pain that travels. Significant ETOH onboard.  Review of Systems  Positive: Chest pain, leg pain  Negative: Numbness, tingling, weakness  Physical Exam  BP 125/90 (BP Location: Right Arm)   Pulse (!) 102   Temp (!) 97.3 F (36.3 C) (Oral)   Resp 16   SpO2 100%  Gen:   Awake, no distress, intoxicated Resp:  Normal effort  MSK:   Moves extremities without difficulty. Patient left leg tender to medial thigh Other:    Medical Decision Making  Medically screening exam initiated at 1:41 PM.  Appropriate orders placed.  Devin [name] was informed that the remainder of the evaluation will be completed by another provider, this initial triage assessment does not replace that evaluation, and the importance of remaining in the ED until their evaluation is complete.     Azucena Cecil, [place] 04/01/21 1350    Azucena Cecil, [place] 04/01/21 1350    Isla Pence, MD 04/01/21 1555

## 2021-04-01 NOTE — ED Triage Notes (Signed)
Patient presents with sudden onset of left leg pain while walking. He reports to EMS that his leg gave out. He now complains of chest pain. ETOH onboard.    EMS vitals:  140/86 BP 100 HR 20 Resp Rate 100% SPO2 on room air 71 CBG 15 GCS

## 2021-04-02 ENCOUNTER — Other Ambulatory Visit: Payer: Self-pay

## 2021-04-02 ENCOUNTER — Encounter (HOSPITAL_COMMUNITY): Payer: Self-pay

## 2021-04-02 ENCOUNTER — Emergency Department (HOSPITAL_COMMUNITY)
Admission: EM | Admit: 2021-04-02 | Discharge: 2021-04-03 | Disposition: A | Discharge status: Home / Self Care | Payer: Medicaid Other | Attending: Emergency Medicine | Admitting: Emergency Medicine

## 2021-04-02 DIAGNOSIS — R000 Tachycardia, unspecified: Secondary | ICD-10-CM | POA: Diagnosis not present

## 2021-04-02 DIAGNOSIS — Z7982 Long term (current) use of aspirin: Secondary | ICD-10-CM | POA: Insufficient documentation

## 2021-04-02 DIAGNOSIS — F17210 Nicotine dependence, cigarettes, uncomplicated: Secondary | ICD-10-CM | POA: Diagnosis not present

## 2021-04-02 DIAGNOSIS — R0789 Other chest pain: Secondary | ICD-10-CM | POA: Diagnosis not present

## 2021-04-02 DIAGNOSIS — Z79899 Other long term (current) drug therapy: Secondary | ICD-10-CM | POA: Diagnosis not present

## 2021-04-02 DIAGNOSIS — M79652 Pain in left thigh: Secondary | ICD-10-CM | POA: Diagnosis not present

## 2021-04-02 DIAGNOSIS — I10 Essential (primary) hypertension: Secondary | ICD-10-CM | POA: Diagnosis not present

## 2021-04-02 NOTE — ED Triage Notes (Signed)
Patient BIB [facility] with left leg pain for months. Was seen here yesterday. Alcohol on board.

## 2021-04-03 ENCOUNTER — Emergency Department (HOSPITAL_COMMUNITY): Payer: Medicaid Other

## 2021-04-03 LAB — CBC WITH DIFFERENTIAL/PLATELET
Abs Immature Granulocytes: 0.02 10*3/uL (ref 0.00–0.07)
Basophils Absolute: 0 10*3/uL (ref 0.0–0.1)
Basophils Relative: 1 %
Eosinophils Absolute: 0.2 10*3/uL (ref 0.0–0.5)
Eosinophils Relative: 4 %
HCT: 41.1 % (ref 39.0–52.0)
Hemoglobin: 13.7 g/dL (ref 13.0–17.0)
Immature Granulocytes: 1 %
Lymphocytes Relative: 42 %
Lymphs Abs: 1.9 10*3/uL (ref 0.7–4.0)
MCH: 33.2 pg (ref 26.0–34.0)
MCHC: 33.3 g/dL (ref 30.0–36.0)
MCV: 99.5 fL (ref 80.0–100.0)
Monocytes Absolute: 0.5 10*3/uL (ref 0.1–1.0)
Monocytes Relative: 12 %
Neutro Abs: 1.7 10*3/uL (ref 1.7–7.7)
Neutrophils Relative %: 40 %
Platelets: 353 10*3/uL (ref 150–400)
RBC: 4.13 MIL/uL — ABNORMAL LOW (ref 4.22–5.81)
RDW: 14.3 % (ref 11.5–15.5)
WBC: 4.3 10*3/uL (ref 4.0–10.5)
nRBC: 0 % (ref 0.0–0.2)

## 2021-04-03 LAB — BASIC METABOLIC PANEL
Anion gap: 12 (ref 5–15)
BUN: 16 mg/dL (ref 6–20)
CO2: 19 mmol/L — ABNORMAL LOW (ref 22–32)
Calcium: 8.8 mg/dL — ABNORMAL LOW (ref 8.9–10.3)
Chloride: 103 mmol/L (ref 98–111)
Creatinine, Ser: 0.8 mg/dL (ref 0.61–1.24)
GFR, Estimated: >60 mL/min (ref >60)
Glucose, Bld: 91 mg/dL (ref 70–99)
Potassium: 4.1 mmol/L (ref 3.5–5.1)
Sodium: 134 mmol/L — ABNORMAL LOW (ref 135–145)

## 2021-04-03 LAB — ETHANOL: Alcohol, Ethyl (B): 217 mg/dL — ABNORMAL HIGH (ref <10)

## 2021-04-03 LAB — TROPONIN I (HIGH SENSITIVITY): Troponin I (High Sensitivity): 11 ng/L (ref <18)

## 2021-04-03 MED ORDER — IBUPROFEN 800 MG PO TABS
800.0000 mg | ORAL_TABLET | Freq: Once | ORAL | Status: AC
  Administered 2021-04-03: 11:00:00 800 mg via ORAL
  Filled 2021-04-03: qty 1

## 2021-04-03 NOTE — Discharge Instructions (Signed)
You were diagnosed with chest wall pain. I have given you some ibuprofen for your pain, but your workup shows your heart is not the problem today.
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
## 2021-04-04 ENCOUNTER — Observation Stay (HOSPITAL_COMMUNITY)
Admission: EM | Admit: 2021-04-04 | Discharge: 2021-04-06 | Disposition: A | Discharge status: Home / Self Care | Payer: Medicaid Other | Attending: Student in an Organized Health Care Education/Training Program | Admitting: Student in an Organized Health Care Education/Training Program

## 2021-04-04 ENCOUNTER — Emergency Department (HOSPITAL_COMMUNITY): Payer: Medicaid Other

## 2021-04-04 ENCOUNTER — Other Ambulatory Visit: Payer: Self-pay

## 2021-04-04 DIAGNOSIS — R000 Tachycardia, unspecified: Secondary | ICD-10-CM

## 2021-04-04 DIAGNOSIS — R0789 Other chest pain: Secondary | ICD-10-CM | POA: Diagnosis present

## 2021-04-04 DIAGNOSIS — F17210 Nicotine dependence, cigarettes, uncomplicated: Secondary | ICD-10-CM | POA: Insufficient documentation

## 2021-04-04 DIAGNOSIS — Y900 Blood alcohol level of less than 20 mg/100 ml: Secondary | ICD-10-CM | POA: Diagnosis not present

## 2021-04-04 DIAGNOSIS — I10 Essential (primary) hypertension: Secondary | ICD-10-CM | POA: Diagnosis not present

## 2021-04-04 DIAGNOSIS — Z7982 Long term (current) use of aspirin: Secondary | ICD-10-CM | POA: Diagnosis not present

## 2021-04-04 DIAGNOSIS — M79652 Pain in left thigh: Secondary | ICD-10-CM | POA: Insufficient documentation

## 2021-04-04 DIAGNOSIS — Z79899 Other long term (current) drug therapy: Secondary | ICD-10-CM | POA: Diagnosis not present

## 2021-04-04 DIAGNOSIS — K2920 Alcoholic gastritis without bleeding: Secondary | ICD-10-CM | POA: Diagnosis not present

## 2021-04-04 DIAGNOSIS — Z5900 Homelessness unspecified: Secondary | ICD-10-CM | POA: Diagnosis not present

## 2021-04-04 DIAGNOSIS — Z20822 Contact with and (suspected) exposure to covid-19: Secondary | ICD-10-CM | POA: Diagnosis not present

## 2021-04-04 DIAGNOSIS — R079 Chest pain, unspecified: Secondary | ICD-10-CM

## 2021-04-04 DIAGNOSIS — M87052 Idiopathic aseptic necrosis of left femur: Secondary | ICD-10-CM

## 2021-04-04 DIAGNOSIS — K2900 Acute gastritis without bleeding: Secondary | ICD-10-CM | POA: Diagnosis present

## 2021-04-04 DIAGNOSIS — Z59812 Housing instability, housed, homelessness in past 12 months: Secondary | ICD-10-CM

## 2021-04-04 DIAGNOSIS — F1020 Alcohol dependence, uncomplicated: Secondary | ICD-10-CM

## 2021-04-04 LAB — CBC WITH DIFFERENTIAL/PLATELET
Abs Immature Granulocytes: 0.02 10*3/uL (ref 0.00–0.07)
Basophils Absolute: 0.1 10*3/uL (ref 0.0–0.1)
Basophils Relative: 1 %
Eosinophils Absolute: 0.1 10*3/uL (ref 0.0–0.5)
Eosinophils Relative: 1 %
HCT: 42.9 % (ref 39.0–52.0)
Hemoglobin: 13.8 g/dL (ref 13.0–17.0)
Immature Granulocytes: 0 %
Lymphocytes Relative: 29 %
Lymphs Abs: 1.8 10*3/uL (ref 0.7–4.0)
MCH: 33.7 pg (ref 26.0–34.0)
MCHC: 32.2 g/dL (ref 30.0–36.0)
MCV: 104.9 fL — ABNORMAL HIGH (ref 80.0–100.0)
Monocytes Absolute: 0.7 10*3/uL (ref 0.1–1.0)
Monocytes Relative: 11 %
Neutro Abs: 3.6 10*3/uL (ref 1.7–7.7)
Neutrophils Relative %: 58 %
Platelets: 361 10*3/uL (ref 150–400)
RBC: 4.09 MIL/uL — ABNORMAL LOW (ref 4.22–5.81)
RDW: 14 % (ref 11.5–15.5)
WBC: 6.2 10*3/uL (ref 4.0–10.5)
nRBC: 0 % (ref 0.0–0.2)

## 2021-04-04 LAB — COMPREHENSIVE METABOLIC PANEL
ALT: 76 U/L — ABNORMAL HIGH (ref 0–44)
AST: 129 U/L — ABNORMAL HIGH (ref 15–41)
Albumin: 4.1 g/dL (ref 3.5–5.0)
Alkaline Phosphatase: 224 U/L — ABNORMAL HIGH (ref 38–126)
Anion gap: 18 — ABNORMAL HIGH (ref 5–15)
BUN: 15 mg/dL (ref 6–20)
CO2: 18 mmol/L — ABNORMAL LOW (ref 22–32)
Calcium: 9.2 mg/dL (ref 8.9–10.3)
Chloride: 99 mmol/L (ref 98–111)
Creatinine, Ser: 0.85 mg/dL (ref 0.61–1.24)
GFR, Estimated: >60 mL/min (ref >60)
Glucose, Bld: 68 mg/dL — ABNORMAL LOW (ref 70–99)
Potassium: 4.6 mmol/L (ref 3.5–5.1)
Sodium: 135 mmol/L (ref 135–145)
Total Bilirubin: 0.6 mg/dL (ref 0.3–1.2)
Total Protein: 8.2 g/dL — ABNORMAL HIGH (ref 6.5–8.1)

## 2021-04-04 LAB — TROPONIN I (HIGH SENSITIVITY)
Troponin I (High Sensitivity): 22 ng/L — ABNORMAL HIGH (ref <18)
Troponin I (High Sensitivity): 23 ng/L — ABNORMAL HIGH (ref <18)

## 2021-04-04 LAB — LIPASE, BLOOD: Lipase: 24 U/L (ref 11–51)

## 2021-04-04 NOTE — ED Provider Notes (Signed)
Emergency Medicine Provider Triage Evaluation Note  Devin [name] , a 60 y.o. male  was evaluated in triage.  Pt complains of progressively worsening chest pain since this morning and abdominal pain. Patient states he has "liver problems" but unable to specify. Patient reports 3 episodes of emesis today. Patient recently seen for same complaints at [facility] hospital Friday.  Review of Systems  Positive: CP, SOB, abdominal pain Negative: Nausea, vomiting  Physical Exam  BP (!) 170/98 (BP Location: Right Arm)   Pulse (!) 124   Temp 98 F (36.7 C) (Oral)   Resp 16   SpO2 100%  Gen:   Awake, no distress   Resp:  Normal effort  MSK:   Moves extremities without difficulty  Other:    Medical Decision Making  Medically screening exam initiated at 6:38 PM.  Appropriate orders placed.  Devin [name] was informed that the remainder of the evaluation will be completed by another provider, this initial triage assessment does not replace that evaluation, and the importance of remaining in the ED until their evaluation is complete.     Azucena Cecil, PA-C 04/04/21 1839    Isla Pence, MD 04/04/21 [phone]

## 2021-04-04 NOTE — ED Triage Notes (Signed)
Pt with ongoing BLE, back pain, and leg pain, all ongoing x several months. Hypertensive, but non-compliant with meds.

## 2021-04-05 ENCOUNTER — Encounter (HOSPITAL_COMMUNITY): Payer: Self-pay

## 2021-04-05 ENCOUNTER — Emergency Department (HOSPITAL_COMMUNITY): Payer: Medicaid Other

## 2021-04-05 DIAGNOSIS — K2900 Acute gastritis without bleeding: Secondary | ICD-10-CM | POA: Diagnosis present

## 2021-04-05 LAB — CBC WITH DIFFERENTIAL/PLATELET
Abs Immature Granulocytes: 0.04 10*3/uL (ref 0.00–0.07)
Basophils Absolute: 0 10*3/uL (ref 0.0–0.1)
Basophils Relative: 0 %
Eosinophils Absolute: 0 10*3/uL (ref 0.0–0.5)
Eosinophils Relative: 0 %
HCT: 33.8 % — ABNORMAL LOW (ref 39.0–52.0)
Hemoglobin: 11.6 g/dL — ABNORMAL LOW (ref 13.0–17.0)
Immature Granulocytes: 0 %
Lymphocytes Relative: 18 %
Lymphs Abs: 1.6 10*3/uL (ref 0.7–4.0)
MCH: 34.1 pg — ABNORMAL HIGH (ref 26.0–34.0)
MCHC: 34.3 g/dL (ref 30.0–36.0)
MCV: 99.4 fL (ref 80.0–100.0)
Monocytes Absolute: 0.9 10*3/uL (ref 0.1–1.0)
Monocytes Relative: 10 %
Neutro Abs: 6.4 10*3/uL (ref 1.7–7.7)
Neutrophils Relative %: 72 %
Platelets: 303 10*3/uL (ref 150–400)
RBC: 3.4 MIL/uL — ABNORMAL LOW (ref 4.22–5.81)
RDW: 14 % (ref 11.5–15.5)
WBC: 9.1 10*3/uL (ref 4.0–10.5)
nRBC: 0 % (ref 0.0–0.2)

## 2021-04-05 LAB — POC OCCULT BLOOD, ED: Fecal Occult Bld: NEGATIVE

## 2021-04-05 LAB — ETHANOL: Alcohol, Ethyl (B): <10 mg/dL (ref <10)

## 2021-04-05 LAB — HIV ANTIBODY (ROUTINE TESTING W REFLEX): HIV Screen 4th Generation wRfx: NONREACTIVE

## 2021-04-05 LAB — BASIC METABOLIC PANEL
Anion gap: 16 — ABNORMAL HIGH (ref 5–15)
BUN: 14 mg/dL (ref 6–20)
CO2: 20 mmol/L — ABNORMAL LOW (ref 22–32)
Calcium: 8.4 mg/dL — ABNORMAL LOW (ref 8.9–10.3)
Chloride: 96 mmol/L — ABNORMAL LOW (ref 98–111)
Creatinine, Ser: 1.16 mg/dL (ref 0.61–1.24)
GFR, Estimated: >60 mL/min (ref >60)
Glucose, Bld: 70 mg/dL (ref 70–99)
Potassium: 4.4 mmol/L (ref 3.5–5.1)
Sodium: 132 mmol/L — ABNORMAL LOW (ref 135–145)

## 2021-04-05 LAB — MAGNESIUM: Magnesium: 1.6 mg/dL — ABNORMAL LOW (ref 1.7–2.4)

## 2021-04-05 LAB — LACTIC ACID, PLASMA: Lactic Acid, Venous: 1.2 mmol/L (ref 0.5–1.9)

## 2021-04-05 LAB — RESP PANEL BY RT-PCR (FLU A&B, COVID) ARPGX2
Influenza A by PCR: NEGATIVE
Influenza B by PCR: NEGATIVE
SARS Coronavirus 2 by RT PCR: NEGATIVE

## 2021-04-05 LAB — CBG MONITORING, ED: Glucose-Capillary: 75 mg/dL (ref 70–99)

## 2021-04-05 MED ORDER — SODIUM CHLORIDE 0.9 % IV BOLUS
1000.0000 mL | Freq: Once | INTRAVENOUS | Status: AC
  Administered 2021-04-05: 1000 mL via INTRAVENOUS

## 2021-04-05 MED ORDER — ONDANSETRON HCL 4 MG/2ML IJ SOLN
INTRAMUSCULAR | Status: AC
  Filled 2021-04-05: qty 2

## 2021-04-05 MED ORDER — SUCRALFATE 1 GM/10ML PO SUSP: 1.0000 g | Freq: Three times a day (TID) | ORAL | 0 refills | Status: DC

## 2021-04-05 MED ORDER — ONDANSETRON HCL 4 MG/2ML IJ SOLN
4.0000 mg | Freq: Once | INTRAMUSCULAR | Status: AC
  Administered 2021-04-05: 4 mg via INTRAVENOUS
  Filled 2021-04-05: qty 2

## 2021-04-05 MED ORDER — THIAMINE HCL 100 MG/ML IJ SOLN
100.0000 mg | Freq: Every day | INTRAMUSCULAR | Status: DC
  Filled 2021-04-05: qty 2

## 2021-04-05 MED ORDER — LORAZEPAM 2 MG/ML IJ SOLN
1.0000 mg | Freq: Once | INTRAMUSCULAR | Status: AC
  Administered 2021-04-05: 1 mg via INTRAVENOUS
  Filled 2021-04-05: qty 1

## 2021-04-05 MED ORDER — ACETAMINOPHEN 650 MG RE SUPP: 650.0000 mg | Freq: Four times a day (QID) | RECTAL | Status: DC | PRN

## 2021-04-05 MED ORDER — LACTATED RINGERS IV SOLN: INTRAVENOUS | Status: AC

## 2021-04-05 MED ORDER — ONDANSETRON HCL 4 MG/2ML IJ SOLN
4.0000 mg | Freq: Once | INTRAMUSCULAR | Status: AC
  Administered 2021-04-05: 4 mg via INTRAVENOUS

## 2021-04-05 MED ORDER — THIAMINE HCL 100 MG PO TABS
100.0000 mg | ORAL_TABLET | Freq: Every day | ORAL | Status: DC
  Administered 2021-04-05 – 2021-04-06 (×2): 100 mg via ORAL
  Filled 2021-04-05 (×2): qty 1

## 2021-04-05 MED ORDER — IOHEXOL 350 MG/ML SOLN
100.0000 mL | Freq: Once | INTRAVENOUS | Status: AC | PRN
  Administered 2021-04-05: 100 mL via INTRAVENOUS

## 2021-04-05 MED ORDER — POLYETHYLENE GLYCOL 3350 17 G PO PACK: 17.0000 g | PACK | Freq: Every day | ORAL | Status: DC | PRN

## 2021-04-05 MED ORDER — LORAZEPAM 2 MG/ML IJ SOLN
INTRAMUSCULAR | Status: AC
  Filled 2021-04-05: qty 1

## 2021-04-05 MED ORDER — LACTATED RINGERS IV BOLUS
1000.0000 mL | Freq: Once | INTRAVENOUS | Status: AC
  Administered 2021-04-05: 1000 mL via INTRAVENOUS

## 2021-04-05 MED ORDER — LORAZEPAM 1 MG PO TABS: 1.0000 mg | ORAL_TABLET | ORAL | Status: DC | PRN

## 2021-04-05 MED ORDER — THIAMINE HCL 100 MG/ML IJ SOLN: 100.0000 mg | Freq: Every day | INTRAMUSCULAR | Status: DC

## 2021-04-05 MED ORDER — ADULT MULTIVITAMIN W/MINERALS CH
1.0000 | ORAL_TABLET | Freq: Every day | ORAL | Status: DC
  Administered 2021-04-05 – 2021-04-06 (×2): 1 via ORAL
  Filled 2021-04-05 (×2): qty 1

## 2021-04-05 MED ORDER — ONDANSETRON HCL 4 MG/2ML IJ SOLN: 4.0000 mg | Freq: Four times a day (QID) | INTRAMUSCULAR | Status: DC | PRN

## 2021-04-05 MED ORDER — LORAZEPAM 2 MG/ML IJ SOLN: 1.0000 mg | INTRAMUSCULAR | Status: DC | PRN

## 2021-04-05 MED ORDER — ACETAMINOPHEN 325 MG PO TABS
650.0000 mg | ORAL_TABLET | Freq: Four times a day (QID) | ORAL | Status: DC | PRN
  Administered 2021-04-06: 650 mg via ORAL
  Filled 2021-04-05: qty 2

## 2021-04-05 MED ORDER — ONDANSETRON HCL 4 MG PO TABS: 4.0000 mg | ORAL_TABLET | Freq: Three times a day (TID) | ORAL | 0 refills | Status: DC | PRN

## 2021-04-05 MED ORDER — PANTOPRAZOLE SODIUM 40 MG IV SOLR
40.0000 mg | Freq: Two times a day (BID) | INTRAVENOUS | Status: DC
Start: 2021-04-05 — End: 2021-04-06
  Administered 2021-04-05 – 2021-04-06 (×2): 40 mg via INTRAVENOUS
  Filled 2021-04-05 (×2): qty 40

## 2021-04-05 MED ORDER — PANTOPRAZOLE SODIUM 40 MG PO TBEC: 40.0000 mg | DELAYED_RELEASE_TABLET | Freq: Every day | ORAL | 0 refills | Status: DC

## 2021-04-05 MED ORDER — LORAZEPAM 2 MG/ML IJ SOLN
1.0000 mg | Freq: Once | INTRAMUSCULAR | Status: AC
  Administered 2021-04-05: 1 mg via INTRAVENOUS

## 2021-04-05 MED ORDER — FOLIC ACID 1 MG PO TABS
1.0000 mg | ORAL_TABLET | Freq: Every day | ORAL | Status: DC
  Administered 2021-04-05 – 2021-04-06 (×2): 1 mg via ORAL
  Filled 2021-04-05 (×2): qty 1

## 2021-04-05 MED ORDER — PANTOPRAZOLE 80MG IVPB - SIMPLE MED
80.0000 mg | Freq: Once | INTRAVENOUS | Status: AC
  Administered 2021-04-05: 80 mg via INTRAVENOUS
  Filled 2021-04-05: qty 100
  Filled 2021-04-05: qty 80

## 2021-04-05 MED ORDER — SUCRALFATE 1 GM/10ML PO SUSP
1.0000 g | Freq: Three times a day (TID) | ORAL | Status: DC
  Administered 2021-04-05 – 2021-04-06 (×5): 1 g via ORAL
  Filled 2021-04-05 (×8): qty 10

## 2021-04-05 MED ORDER — MAGNESIUM SULFATE 2 GM/50ML IV SOLN
2.0000 g | Freq: Once | INTRAVENOUS | Status: AC
  Administered 2021-04-05: 2 g via INTRAVENOUS
  Filled 2021-04-05: qty 50
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
## 2021-04-05 NOTE — ED Notes (Signed)
Pt tolerating clear liquids. Pt asking for solid food. RN paged MD.

## 2021-04-05 NOTE — ED Notes (Signed)
Pt denies any chest pain at this time and states it is my belly that is hurting.

## 2021-04-05 NOTE — ED Notes (Signed)
Pt resting comfortably. No complaints at this time.

## 2021-04-05 NOTE — ED Notes (Signed)
Pt given a cup of water and some crackers for PO challenge. Will continue to monitor.

## 2021-04-06 ENCOUNTER — Telehealth: Payer: Self-pay

## 2021-04-06 ENCOUNTER — Other Ambulatory Visit (HOSPITAL_COMMUNITY): Payer: Self-pay

## 2021-04-06 DIAGNOSIS — K2901 Acute gastritis with bleeding: Secondary | ICD-10-CM

## 2021-04-06 DIAGNOSIS — M87052 Idiopathic aseptic necrosis of left femur: Secondary | ICD-10-CM

## 2021-04-06 LAB — COMPREHENSIVE METABOLIC PANEL
ALT: 61 U/L — ABNORMAL HIGH (ref 0–44)
AST: 101 U/L — ABNORMAL HIGH (ref 15–41)
Albumin: 3.1 g/dL — ABNORMAL LOW (ref 3.5–5.0)
Alkaline Phosphatase: 180 U/L — ABNORMAL HIGH (ref 38–126)
Anion gap: 9 (ref 5–15)
BUN: 6 mg/dL (ref 6–20)
CO2: 22 mmol/L (ref 22–32)
Calcium: 8.6 mg/dL — ABNORMAL LOW (ref 8.9–10.3)
Chloride: 101 mmol/L (ref 98–111)
Creatinine, Ser: 0.9 mg/dL (ref 0.61–1.24)
GFR, Estimated: >60 mL/min (ref >60)
Glucose, Bld: 113 mg/dL — ABNORMAL HIGH (ref 70–99)
Potassium: 4.1 mmol/L (ref 3.5–5.1)
Sodium: 132 mmol/L — ABNORMAL LOW (ref 135–145)
Total Bilirubin: 0.8 mg/dL (ref 0.3–1.2)
Total Protein: 6.4 g/dL — ABNORMAL LOW (ref 6.5–8.1)

## 2021-04-06 LAB — CBC WITH DIFFERENTIAL/PLATELET
Abs Immature Granulocytes: 0.02 10*3/uL (ref 0.00–0.07)
Basophils Absolute: 0 10*3/uL (ref 0.0–0.1)
Basophils Relative: 0 %
Eosinophils Absolute: 0.1 10*3/uL (ref 0.0–0.5)
Eosinophils Relative: 1 %
HCT: 35.6 % — ABNORMAL LOW (ref 39.0–52.0)
Hemoglobin: 11.8 g/dL — ABNORMAL LOW (ref 13.0–17.0)
Immature Granulocytes: 0 %
Lymphocytes Relative: 27 %
Lymphs Abs: 1.7 10*3/uL (ref 0.7–4.0)
MCH: 33.1 pg (ref 26.0–34.0)
MCHC: 33.1 g/dL (ref 30.0–36.0)
MCV: 100 fL (ref 80.0–100.0)
Monocytes Absolute: 0.6 10*3/uL (ref 0.1–1.0)
Monocytes Relative: 10 %
Neutro Abs: 3.9 10*3/uL (ref 1.7–7.7)
Neutrophils Relative %: 62 %
Platelets: 280 10*3/uL (ref 150–400)
RBC: 3.56 MIL/uL — ABNORMAL LOW (ref 4.22–5.81)
RDW: 13.7 % (ref 11.5–15.5)
WBC: 6.4 10*3/uL (ref 4.0–10.5)
nRBC: 0 % (ref 0.0–0.2)

## 2021-04-06 LAB — MAGNESIUM: Magnesium: 1.9 mg/dL (ref 1.7–2.4)

## 2021-04-06 MED ORDER — PANTOPRAZOLE SODIUM 40 MG PO TBEC: 40.0000 mg | DELAYED_RELEASE_TABLET | Freq: Two times a day (BID) | ORAL | Status: DC

## 2021-04-06 MED ORDER — NALTREXONE HCL 50 MG PO TABS
50.0000 mg | ORAL_TABLET | Freq: Every day | ORAL | Status: DC
  Administered 2021-04-06: 50 mg via ORAL
  Filled 2021-04-06: qty 1

## 2021-04-06 MED ORDER — SUCRALFATE 1 GM/10ML PO SUSP
1.0000 g | Freq: Three times a day (TID) | ORAL | 0 refills | Status: DC
  Filled 2021-04-06: qty 280, 7d supply, fill #0

## 2021-04-06 MED ORDER — ONDANSETRON HCL 4 MG PO TABS
4.0000 mg | ORAL_TABLET | Freq: Three times a day (TID) | ORAL | 0 refills | Status: DC | PRN
  Filled 2021-04-06: qty 12, 4d supply, fill #0

## 2021-04-06 MED ORDER — PANTOPRAZOLE SODIUM 40 MG PO TBEC
40.0000 mg | DELAYED_RELEASE_TABLET | Freq: Every day | ORAL | 0 refills | Status: DC
  Filled 2021-04-06: qty 30, 30d supply, fill #0

## 2021-04-06 MED ORDER — NALTREXONE HCL 50 MG PO TABS
50.0000 mg | ORAL_TABLET | Freq: Every day | ORAL | 0 refills | Status: AC
  Filled 2021-04-06: qty 30, 30d supply, fill #0

## 2021-04-06 NOTE — ED Notes (Signed)
3W RN called, RN denies any questions and agreed that we can bring the pt up

## 2021-04-06 NOTE — TOC Initial Note (Signed)
Transition of Care [facility]) - Initial/Assessment Note    Patient Details  Name: Devin [name] MRN: [id number] Date of Birth: 1960/06/30  [facility] CM/SW Contact:    Pollie Friar, RN Phone Number: 04/06/2021, 1:17 PM  Clinical Narrative:                 Patient has been living in [place], [place] with his niece but has decided not to return to her home. Pt has no other place to stay but states he does receive some money from the government. CM has provided him shelter list for [facility] in the area where he may be able to get a room. Pt to call and see about a place to stay after the hospital.  Pt wanting a cane instead of walker for home. CM has ordered this through [facility] and they will deliver it to the room.  PCP appt arranged through [facility]. Information on the AVS. CM has provided on AVS the number for him to arrange medicaid transport. CM has also placed [facility] transportation information on AVS and sent scanned in his waiver.  TOC following.  Expected Discharge Plan: Homeless Shelter Barriers to Discharge: Continued Medical Work up   Patient Goals and CMS Choice     Choice offered to / list presented to : Patient  Expected Discharge Plan and Services Expected Discharge Plan: Homeless Shelter   Discharge Planning Services: CM Consult Post Acute Care Choice: Durable Medical Equipment Living arrangements for the past 2 months: Homeless                 DME Arranged: Kasandra Knudsen DME Agency: [employer] Date DME Agency Contacted: 04/06/21   Representative spoke with at DME Agency: Freda Munro            Prior Living Arrangements/Services Living arrangements for the past 2 months: Homeless Lives with:: Self Patient language and need for interpreter reviewed:: Yes Do you feel safe going back to the place where you live?: Yes        Care giver support system in place?: No (comment)   Criminal Activity/Legal Involvement Pertinent to  Current Situation/Hospitalization: No - Comment as needed  Activities of Daily Living      Permission Sought/Granted                  Emotional Assessment Appearance:: Appears stated age Attitude/Demeanor/Rapport: Engaged Affect (typically observed): Accepting Orientation: : Oriented to Self, Oriented to Place, Oriented to  Time, Oriented to Situation Alcohol / Substance Use: Alcohol Use Psych Involvement: No (comment)  Admission diagnosis:  Sinus tachycardia [R00.0] Acute gastritis [K29.00] Chest pain [H82.9] Acute alcoholic gastritis, presence of bleeding unspecified [K29.20] Patient Active Problem List   Diagnosis Date Noted   Avascular necrosis of femur head, left ([place]) 04/06/2021   Acute gastritis 04/05/2021   Musculoskeletal chest pain 07/08/2017   Essential hypertension 07/08/2017   Normal coronary arteries 05/28/2017   Medication management 01/29/2017   Normocytic anemia 93/71/6967   Alcoholic cirrhosis ([place]) 89/38/1017   Acute left-sided low back pain without sciatica 11/09/2016   Esophagitis 05/11/2016   GERD with esophagitis 06/21/2015   Transaminitis 06/21/2015   External hemorrhoid 01/12/2015   Depression 09/29/2014   Chronic gout of right elbow 07/07/2014   Homelessness 05/03/2013   H/O medication noncompliance 05/03/2013   Tobacco abuse 07/15/2012   Healthcare maintenance 11/29/2011   Hyperlipidemia 05/25/2010   Alcohol use disorder, moderate, dependence ([place]) 02/02/2010   Hypertensive cardiomyopathy,  without heart failure ([place]) 02/02/2010   PCP:  Pcp, No Pharmacy:   [id number] [place], [place] [address] [place] [address] Phone: [phone] Fax: [phone]  [id number] [place], [place] [address] [place] [place] [address] Phone: [phone] Fax: [phone]     Social Determinants of Health (SDOH) Interventions     Readmission Risk Interventions No flowsheet data found.

## 2021-04-06 NOTE — Evaluation (Signed)
Physical Therapy Evaluation Patient Details Name: Devin [name] MRN: [id number] DOB: May 07, 1960 Today's Date: 04/06/2021  History of Present Illness  Pt is a 60 y/o male admitted secondary to hematemesis and Alcohol Gastritis w/ Esophagitis. PMH includes HTN, alcohol abuse, liver disease, tobacco use.  Clinical Impression  Pt admitted secondary to problem above with deficits below. Pt requiring min guard A for mobility tasks using RW; tolerated well. Educated about use of RW at d/c, however, pt reports he would prefer to have a cane as he normally ambulates long distances. Discussed home situation, and pt reports he would prefer to be alone rather than go back to stay with niece. Anticipate pt will progress well. Will continue to follow acutely.        Recommendations for follow up therapy are one component of a multi-disciplinary discharge planning process, led by the attending physician.  Recommendations may be updated based on patient status, additional functional criteria and insurance authorization.  Follow Up Recommendations No PT follow up    Assistance Recommended at Discharge PRN  Functional Status Assessment Patient has had a recent decline in their functional status and demonstrates the ability to make significant improvements in function in a reasonable and predictable amount of time.  Engineer, technical sales    Recommendations for Other Services       Precautions / Restrictions Precautions Precautions: Fall Restrictions Weight Bearing Restrictions: No      Mobility  Bed Mobility Overal bed mobility: Modified Independent                  Transfers Overall transfer level: Needs assistance Equipment used: Rolling walker (2 wheels) Transfers: Sit to/from Stand Sit to Stand: Min guard           General transfer comment: Min guard for safety    Ambulation/Gait Ambulation/Gait assistance: Min guard;Supervision Gait Distance (Feet): 250  Feet Assistive device: Rolling walker (2 wheels) Gait Pattern/deviations: Step-through pattern;Decreased stride length Gait velocity: Decreased     General Gait Details: Overall steady gait using RW. Min guard for safety. Pt reporting he would prefer cane, however, as he has to walk alot.  Stairs            Wheelchair Mobility    Modified Rankin (Stroke Patients Only)       Balance Overall balance assessment: Needs assistance Sitting-balance support: No upper extremity supported;Feet supported Sitting balance-Leahy Scale: Good     Standing balance support: Bilateral upper extremity supported;No upper extremity supported Standing balance-Leahy Scale: Fair Standing balance comment: Able to ambulate short distances without UE support                             Pertinent Vitals/Pain Pain Assessment: Faces Faces Pain Scale: Hurts little more Pain Location: LLE Pain Descriptors / Indicators: Grimacing;Guarding Pain Intervention(s): Limited activity within patient's tolerance;Monitored during session;Repositioned    Home Living Family/patient expects to be discharged to:: Shelter/Homeless                   Additional Comments: Was staying with niece, but does not want to go back to live with her. Asking for options for housing.    Prior Function Prior Level of Function : Independent/Modified Independent                     Hand Dominance        Extremity/Trunk Assessment   Upper Extremity  Assessment Upper Extremity Assessment: Overall WFL for tasks assessed    Lower Extremity Assessment Lower Extremity Assessment: LLE deficits/detail LLE Deficits / Details: Pt reporting increased pain in L thigh    Cervical / Trunk Assessment Cervical / Trunk Assessment: Normal  Communication   Communication: No difficulties  Cognition Arousal/Alertness: Awake/alert Behavior During Therapy: WFL for tasks assessed/performed Overall Cognitive  Status: Within Functional Limits for tasks assessed                                          General Comments General comments (skin integrity, edema, etc.): Had lengthy discussion about options to stay at d/c, but pt prefers to be on his own    Exercises     Assessment/Plan    PT Assessment Patient needs continued PT services  PT Problem List Decreased strength;Decreased activity tolerance;Decreased balance;Decreased mobility;Decreased knowledge of use of DME;Decreased knowledge of precautions;Pain       PT Treatment Interventions DME instruction;Gait training;Stair training;Functional mobility training;Therapeutic activities;Balance training;Therapeutic exercise;Patient/family education    PT Goals (Current goals can be found in the Care Plan section)  Acute Rehab PT Goals Patient Stated Goal: to find a place to stay PT Goal Formulation: With patient Time For Goal Achievement: 04/20/21 Potential to Achieve Goals: Good    Frequency Min 3X/week   Barriers to discharge Inaccessible home environment      Co-evaluation               AM-PAC PT "6 Clicks" Mobility  Outcome Measure Help needed turning from your back to your side while in a flat bed without using bedrails?: None Help needed moving from lying on your back to sitting on the side of a flat bed without using bedrails?: None Help needed moving to and from a bed to a chair (including a wheelchair)?: A Little Help needed standing up from a chair using your arms (e.g., wheelchair or bedside chair)?: A Little Help needed to walk in hospital room?: A Little Help needed climbing 3-5 steps with a railing? : A Little 6 Click Score: 20    End of Session Equipment Utilized During Treatment: Gait belt Activity Tolerance: Patient tolerated treatment well Patient left: in bed;with call bell/phone within reach;with bed alarm set Nurse Communication: Mobility status PT Visit Diagnosis: Other abnormalities  of gait and mobility (R26.89);Pain Pain - Right/Left: Left Pain - part of body: Leg    Time: 1041-1056 PT Time Calculation (min) (ACUTE ONLY): 15 min   Charges:   PT Evaluation $PT Eval Low Complexity: 1 Low          Lou Miner, DPT  Acute Rehabilitation Services  Pager: [phone] Office: [phone]   Rudean Hitt 04/06/2021, 3:44 PM

## 2021-04-06 NOTE — ED Notes (Signed)
RN called pharmacy to verify that giving the 2200 scheduled sucralfate dose now would not be too close to the previously administered dose at 2113.

## 2021-04-06 NOTE — Progress Notes (Addendum)
HD#0 Subjective:  Overnight Events: NAEO   Patient says he is feeling much better today. He was not sure he was going to make it when he came in yesterday. He is open to the idea of taking some medication to help him stop drinking. Says that he does have some pain in his hip.   Objective:  Vital signs in last 24 hours: Vitals:   04/06/21 0325 04/06/21 0352 04/06/21 0438 04/06/21 0528  BP: (!) 165/114 (!) 150/108 (!) 148/99 (!) 151/100  Pulse: (!) 103 (!) 101 (!) 107 (!) 101  Resp: 20  (!) 21 19  Temp:   98.7 F (37.1 C) 98.7 F (37.1 C)  TempSrc:   Oral Oral  SpO2: 99% 98% 98% 98%  Weight:    70.5 kg  Height:    5' 9.69" (1.77 m)   Supplemental O2: Room Air SpO2: 98 %   Physical Exam:  Constitutional:chronically ill appearing gentleman resting comfortably in bed in no acute distress Cardiovascular: regular rate and rhythm, no m/r/g Pulmonary/Chest: normal work of breathing on room air Abdominal: soft, tender to palpation in the epigastric region, normal bowel sounds Neurological: alert & oriented x 3, answering questions appropriately Skin: warm and dry Psych: normal affect  Filed Weights   04/05/21 0340 04/06/21 0528  Weight: 70.3 kg 70.5 kg     Intake/Output Summary (Last 24 hours) at 04/06/2021 0625 Last data filed at 04/06/2021 0500 Gross per 24 hour  Intake 2483.18 ml  Output 750 ml  Net 1733.18 ml   Net IO Since Admission: 2,733.18 mL [04/06/21 0625]  Pertinent Labs: CBC Latest Ref Rng & Units 04/06/2021 04/05/2021 04/04/2021  WBC 4.0 - 10.5 K/uL 6.4 9.1 6.2  Hemoglobin 13.0 - 17.0 g/dL 11.8(L) 11.6(L) 13.8  Hematocrit 39.0 - 52.0 % 35.6(L) 33.8(L) 42.9  Platelets 150 - 400 K/uL 280 303 361    CMP Latest Ref Rng & Units 04/06/2021 04/05/2021 04/04/2021  Glucose 70 - 99 mg/dL 113(H) 70 68(L)  BUN 6 - 20 mg/dL 6 14 15   Creatinine 0.61 - 1.24 mg/dL 0.90 1.16 0.85  Sodium 135 - 145 mmol/L 132(L) 132(L) 135  Potassium 3.5 - 5.1 mmol/L 4.1 4.4 4.6   Chloride 98 - 111 mmol/L 101 96(L) 99  CO2 22 - 32 mmol/L 22 20(L) 18(L)  Calcium 8.9 - 10.3 mg/dL 8.6(L) 8.4(L) 9.2  Total Protein 6.5 - 8.1 g/dL 6.4(L) - 8.2(H)  Total Bilirubin 0.3 - 1.2 mg/dL 0.8 - 0.6  Alkaline Phos 38 - 126 U/L 180(H) - 224(H)  AST 15 - 41 U/L 101(H) - 129(H)  ALT 0 - 44 U/L 61(H) - 76(H)    Imaging: No results found.  Assessment/Plan:   Principal Problem:   Acute gastritis   Patient Summary: Devin [name] is a 60 y.o. with a past medical history of AUD and hypertension who presents to the ED with complaints of bloody emesis and left hip pain, currently admitted for acute gastritis.   # Acute Alcohol Gastritis w/ Esophagitis  Patient presenting with 1 week history of nausea with abdominal pain in the setting of heavy alcohol use. CT with evidence of gastritis and esophagitis. Symptoms have improved today and patient has had no further emesis. He can likely go home today as long as he tolerates his diet with good follow up and resources provided to help with his housing situation. Will also get PT to see him to assess for mobility aids. - Protonix 40 mg IV  BID  - Carafate 1 g QID - start naltrexone upon discharge - TOC consult, arrange social work follow up in clinic - PT to eval and treat - Zofran PRN for nausea/vomiting    # Hematemesis; resolved Given patient's stable hemoglobin suspect possible Mallory-Weiss tear. No further episodes since presenting to the ED.  Since vomiting has also resolved no need for GI consult at this time -- daily CBC - Transfuse for hemoglobin less than 7    # Alcohol Use Disorder  Longstanding history of alcohol use disorder that patient describes as now binging.  Patient has received 2 units of ativan overnight. Will continue to monitor. -- start natrexone upon discharge - Daily folic acid and thiamine - CIWA with Ativan  - TOC consult for substance abuse resources   # Sinus Tachycardia  Stable. Continues to be  in the low 100s. Management of alcoholic withdrawal as mentioned above. Received 24 hours of fluids. Appears to be at his baseline   # Avascular Necrosis, Left Femoral Head CT on admission with findings consistent with avascular necrosis of the left femoral head that has progressed from prior. Will eventually need referral to orthopedic surgery for consideration of total hip replacement, but given patient's lack of resources this will likely be difficult to obtain. Until he has more stable housing and his alcohol use is under control - Outpatient referral to orthopedic surgery when appropriate   # Hypertension  Previous history of HTN, not currently on medical therapy due to non-adherence. BP above goal at this time, but will hold off on anti-hypertensives until resolution of acute illness.    Diet:  Regular diet VTE: SCDs IVF: None Code: Full  Scarlett Presto, MD Internal Medicine Resident PGY-1 Pager [phone] Please contact the on call pager after 5 pm and on weekends at [phone].

## 2021-04-06 NOTE — Progress Notes (Signed)
PHARMACIST - PHYSICIAN COMMUNICATION  DR:   Ileene Musa  CONCERNING: IV to Oral Route Change Policy  RECOMMENDATION: This patient is receiving Protonix by the intravenous route.  Based on criteria approved by the Pharmacy and Therapeutics Committee, the intravenous medication(s) is/are being converted to the equivalent oral dose form(s).   DESCRIPTION: These criteria include: The patient is eating (either orally or via tube) and/or has been taking other orally administered medications for a least 24 hours The patient has no evidence of active gastrointestinal bleeding or impaired GI absorption (gastrectomy, short bowel, patient on TNA or NPO).  If you have questions about this conversion, please contact the Pharmacy Department  []   [phone] )  [facility] []   [phone] )  [facility] [x]   [phone] )  [facility] []   [phone] )  [facility] []   [phone] )  [facility]

## 2021-04-06 NOTE — ED Notes (Signed)
3W called asking for a purple man, pt still under review by 3W charge nurse

## 2021-04-06 NOTE — Discharge Instructions (Signed)
Devin [name]  You were recently admitted to [facility] for nausea and vomiting. We gave you some medications to help with the nausea as well as IV fluids to prevent dehydration. It is likely that your vomiting was caused by alcohol intake, and we recommend that you stop drinking alcohol. We will prescribe you a medication to help you with this as well as get you in touch with our social worker.  You will be prescribed with the following medications  Start taking naltrexone 50 mg daily Start taking protonix 40 mg daily Start taking sulcrafate 10 mLs with meals and at bedtime.   You should seek further medical care if you start experiencing more nausea or vomiting, confusion, difficulty breathing, or unable to tolerate food/drink.  We recommend that you see your primary care doctor in about a week to make sure that you continue to improve. We are so glad that you are feeling better.  Sincerely, Scarlett Presto, MD

## 2021-04-06 NOTE — Progress Notes (Signed)
Pt seen and evaluated. Overall at a min guard to supervision level using RW. Tolerated mobility well. Reports he prefers to have a cane for ambulation as he walks long distances, so recommending cane at d/c. Discussed living arrangements and pt reports he would prefer to be on his own. Notified CM and CM to give shelter list. Formal note to follow.   Reuel Derby, PT, DPT  Acute Rehabilitation Services  Pager: [phone] Office: [phone]

## 2021-04-06 NOTE — Telephone Encounter (Signed)
Dr Lisabeth Devoid called requested a Hospital f/u  for pt being discharged today was given 12/14 with Dr Alfonse Spruce @3 :15

## 2021-04-06 NOTE — ED Notes (Signed)
3W called asked for RN's last name to iniate purple man, 3W charge nurse did not give the RN's last name

## 2021-04-06 NOTE — ED Notes (Signed)
AC and rapid response called and aware

## 2021-04-06 NOTE — ED Notes (Signed)
Rapid response called verified that pt is appropriate for floor

## 2021-04-06 NOTE — TOC Transition Note (Signed)
Transition of Care [facility]) - CM/SW Discharge Note   Patient Details  Name: Devin [name] MRN: [id number] Date of Birth: Aug 31, 1960  [facility] CM/SW Contact:  Pollie Friar, RN Phone Number: 04/06/2021, 2:47 PM   Clinical Narrative:    Patient is unsure where he is going but just asked for bus passes. CM provided pt with passes.  Medications for home to be delivered to room per [place].  Pt has cane at the bedside.    Final next level of care: Homeless Shelter Barriers to Discharge: No Barriers Identified   Patient Goals and CMS Choice     Choice offered to / list presented to : Patient  Discharge Placement                       Discharge Plan and Services   Discharge Planning Services: CM Consult Post Acute Care Choice: Durable Medical Equipment          DME Arranged: Kasandra Knudsen DME Agency: [employer] Date DME Agency Contacted: 04/06/21   Representative spoke with at DME Agency: [place] Determinants of Health ([place]) Interventions     Readmission Risk Interventions No flowsheet data found.

## 2021-04-06 NOTE — Discharge Summary (Signed)
Name: Devin [name] MRN: [id number] DOB: [birth year] 60 y.o. PCP: Pcp, No  Date of Admission: 04/04/2021  6:17 PM Date of Discharge:   04/06/21 Attending Physician: Axel Filler, *  Discharge Diagnosis: 1. Acute Alcohol Gastritis w/ Esophagitis  2. Hematemesis; resolved 3. Alcohol Use Disorder  4. Sinus Tachycardia  5. Avascular Necrosis, Left Femoral Head 6. Hypertension   Discharge Medications: Allergies as of 04/06/2021   No Known Allergies      Medication List     STOP taking these medications    thiamine 100 MG tablet       TAKE these medications    acetaminophen 500 MG tablet Commonly known as: TYLENOL Take 1,000 mg by mouth every 6 (six) hours as needed for moderate pain.   aspirin EC 81 MG tablet Take 1 tablet (81 mg total) by mouth daily.   ibuprofen 800 MG tablet Commonly known as: ADVIL Take 1 tablet (800 mg total) by mouth every 8 (eight) hours as needed for moderate pain.   metoprolol succinate 50 MG 24 hr tablet Commonly known as: TOPROL-XL Take 3 tablets (150 mg total) by mouth daily.   naltrexone 50 MG tablet Commonly known as: DEPADE Take 1 tablet (50 mg total) by mouth daily.   ondansetron 4 MG tablet Commonly known as: ZOFRAN Take 1 tablet (4 mg total) by mouth every 8 (eight) hours as needed for nausea or vomiting.   pantoprazole 40 MG tablet Commonly known as: Protonix Take 1 tablet (40 mg total) by mouth daily.   sucralfate 1 GM/10ML suspension Commonly known as: Carafate Take 10 mLs (1 g total) by mouth 4 (four) times daily -  with meals and at bedtime.               Durable Medical Equipment  (From admission, onward)           Start     Ordered   04/06/21 1131  For home use only DME Cane  Once        04/06/21 1131   04/06/21 1055  For home use only DME Cane  Once        04/06/21 1054            Disposition and follow-up:   [name] was discharged from [facility] in Stable condition.  At the hospital follow up visit please address:  1.  Alcohol use disorder; gastritis- discuss naltrexone with patient and provide resources to help with cessation. Also assess for abdominal pain  2. Unstable housing- consult social work and provide patient with further resources as needed  3. HTN- patient on medical therapy in the past though has been lost to follow up and is not on any medications. Restart previous medications as able depending on patient ability/willingness to keep up with regimen.  4. Avascular necrosis- will need orthopedic follow up and possible hip replacement once social situation is more stable  5.  Labs / imaging needed at time of follow-up: cbc  3.  Pending labs/ test needing follow-up: none  Follow-up Appointments:   Hospital Course by problem list: 1. Acute Alcohol Gastritis w/ Esophagitis; Hematemesis; alcohol use disorder Patient presenting with 1 week history of nausea with abdominal pain in the setting of heavy alcohol use. CT with evidence of gastritis and esophagitis On presentation patient reported several rounds of emesis followed by hematemesis. This was likely due to a mallory weiss tear and patient's hemoglobin remained stable  and he had no further emesis in he hospital. He was given IV fluids, IV protonix, and carafate for his abdominal pain. By 12/7 his pain had improved and he was able to tolerate a regular diet. HE was deemed stable for discharge and set up with resources to help him with his housing situation as well as his alcohol use. He was discharged with a prescription of naltrexone 50 daily, protonix, and sulcrafate and set up with follow up in clinic to provide support.   2. Sinus tachycardia Patient was likely volume down in the setting of binge drinkning, emesis, and pain. Patient was given IV fluids and this improved to his baseline of ~low 100s.  3. Avascular necrosis of left femoral head CT on admission  showed progressive avascular necrosis from prior imaging. He will eventually need orthopedic follow up for a possible total hip replacement but given his unstable housing and alcohol use he is likely not a good candidate at this time. Will make note of this for his primary care doctors to follow up once his social situation improves.   4. HTN Patient's BP was elevated on admission to the 140s-150s which was above goal. Patient previously on hypertensive therapy, though has not continued to be adherent to medications. Will work to improve alcohol use disorder/social situation/housing as above and treat hypertension as able in the outpatient setting.  Discharge Exam:   BP (!) 151/100 (BP Location: Right Arm)   Pulse (!) 101   Temp 98.7 F (37.1 C) (Oral)   Resp 19   Ht 5' 9.69" (1.77 m)   Wt 70.5 kg   SpO2 98%   BMI 22.50 kg/m  Discharge exam:  KWI:[id number] ill appearing man resting comfortably in bed CV: RRR no m/r/g Resp: normal WOB on room air Abd: soft, bowel sounds present, epigastric tenderness Neuro: alert oriented and answering questions appropriately Skin: warm and dry Psych: normal affect  Pertinent Labs, Studies, and Procedures:   DG Chest 1 View  Result Date: 04/04/2021 CLINICAL DATA:  Chest pain. EXAM: CHEST  1 VIEW COMPARISON:  Chest x-ray 04/03/2021. FINDINGS: The heart size and mediastinal contours are within normal limits. Nodular density projecting over the left lower lung may represent a nipple shadow. The lungs are otherwise clear. No acute fractures are seen. There is a healed left sixth rib fracture, unchanged. IMPRESSION: 1. No evidence for pneumonia or edema. 2. Nodular density projecting over the left lower lung may represent a nipple shadow. Recommend repeat chest x-ray with nipple markers or follow-up nonemergent chest CT to exclude pulmonary nodule. Electronically Signed   By: Ronney Asters M.D.   On: 04/04/2021 19:37   CT Angio Chest/Abd/Pel for Dissection  W and/or W/WO  Result Date: 04/05/2021 CLINICAL DATA:  Abdominal pain, aortic dissection suspected. EXAM: CT ANGIOGRAPHY CHEST, ABDOMEN AND PELVIS TECHNIQUE: Non-contrast CT of the chest was initially obtained. Multidetector CT imaging through the chest, abdomen and pelvis was performed using the standard protocol during bolus administration of intravenous contrast. Multiplanar reconstructed images and MIPs were obtained and reviewed to evaluate the vascular anatomy. CONTRAST:  120mL OMNIPAQUE IOHEXOL 350 MG/ML SOLN COMPARISON:  11/08/2018. FINDINGS: CTA CHEST FINDINGS Cardiovascular: The heart is normal in size and there is no pericardial effusion. There is no evidence of aortic aneurysm or dissection. The pulmonary trunk is normal in caliber. Mediastinum/Nodes: No mediastinal, hilar or axillary lymphadenopathy. There is thickening of the walls of the proximal esophagus in the distal esophagus. A small hiatal hernia is present.  The trachea and thyroid gland are within normal limits. Lungs/Pleura: There is an apical pleural bleb on the right. The lungs are clear. No effusion or pneumothorax is seen. Musculoskeletal: Degenerative changes are present in the thoracic spine. There is bony deformity of the sternum, possibly representing an old healed fracture. No acute osseous abnormality is seen. Review of the MIP images confirms the above findings. CTA ABDOMEN AND PELVIS FINDINGS VASCULAR Aorta: Normal caliber aorta without aneurysm, dissection, vasculitis or significant stenosis. Celiac: Patent without evidence of aneurysm, dissection, vasculitis or significant stenosis. SMA: Patent without evidence of aneurysm, dissection, vasculitis or significant stenosis. Renals: Both renal arteries are patent without evidence of aneurysm, dissection, vasculitis, fibromuscular dysplasia or significant stenosis. IMA: Patent without evidence of aneurysm, dissection, vasculitis or significant stenosis. Inflow: Mild atherosclerotic  calcification of the common iliac and internal arteries bilaterally. No aneurysm or dissection. Veins: No obvious venous abnormality within the limitations of this arterial phase study. Review of the MIP images confirms the above findings. NON-VASCULAR Hepatobiliary: No focal liver abnormality is seen. Mild hepatic steatosis is noted with scattered areas of focal fatty sparing at the gallbladder fossa and in the left lobe of the liver. No gallstones, gallbladder wall thickening, or biliary dilatation. Pancreas: Unremarkable. No pancreatic ductal dilatation or surrounding inflammatory changes. Spleen: Normal in size without focal abnormality. Adrenals/Urinary Tract: Adrenal glands are unremarkable. No renal calculus or hydronephrosis. The kidneys enhance symmetrically. Subcentimeter hypodensities are noted in the kidneys bilaterally which are too small to further characterize. Bladder is unremarkable. Stomach/Bowel: There is a small hiatal hernia with thickening of the gastric mucosa. No bowel obstruction, free air, or pneumatosis. A few scattered diverticula are present along the colon without evidence of diverticulitis. The appendix is not definitely visualized on exam. Lymphatic: No abdominal or pelvic lymphadenopathy. Reproductive: The prostate gland is normal in size. Other: No free fluid in the pelvis. There are bilateral inguinal hernias containing fluid. Musculoskeletal: Degenerative changes are present in the lumbar spine. 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 No acute osseous abnormality. Review of the MIP images confirms the above findings. IMPRESSION: 1. No evidence of aortic dissection or aneurysm. 2. Thickening of the walls of the proximal esophagus, distal esophagus and gastric mucosa, suggesting esophagitis/gastritis. There is a small hiatal hernia. 3. Hepatic steatosis with areas of focal fatty sparing. 4. Diverticulosis without diverticulitis. 5. Bilateral inguinal hernias containing  fluid. 6. Avascular necrosis of the left femoral head. 7. Remaining findings as described above. Electronically Signed   By: Brett Fairy M.D.   On: 04/05/2021 03:13    CBC Latest Ref Rng & Units 04/06/2021 04/05/2021 04/04/2021  WBC 4.0 - 10.5 K/uL 6.4 9.1 6.2  Hemoglobin 13.0 - 17.0 g/dL 11.8(L) 11.6(L) 13.8  Hematocrit 39.0 - 52.0 % 35.6(L) 33.8(L) 42.9  Platelets 150 - 400 K/uL 280 303 361    BMP Latest Ref Rng & Units 04/06/2021 04/05/2021 04/04/2021  Glucose 70 - 99 mg/dL 113(H) 70 68(L)  BUN 6 - 20 mg/dL 6 14 15   Creatinine 0.61 - 1.24 mg/dL 0.90 1.16 0.85  BUN/Creat Ratio 9 - 20 - - -  Sodium 135 - 145 mmol/L 132(L) 132(L) 135  Potassium 3.5 - 5.1 mmol/L 4.1 4.4 4.6  Chloride 98 - 111 mmol/L 101 96(L) 99  CO2 22 - 32 mmol/L 22 20(L) 18(L)  Calcium 8.9 - 10.3 mg/dL 8.6(L) 8.4(L) 9.2    Discharge Instructions:   Signed: Scarlett Presto, MD 04/06/2021, 11:01 AM   Pager: [phone]

## 2021-04-06 NOTE — TOC CAGE-AID Note (Signed)
Transition of Care [facility]) - CAGE-AID Screening   Patient Details  Name: Devin [name] MRN: [id number] Date of Birth: 1960/09/09  [facility] CM/SW Contact:    Pollie Friar, RN Phone Number: 04/06/2021, 1:24 PM   Clinical Narrative: CM provided pt with information on inpatient and outpatient alcohol counseling.   CAGE-AID Screening:    Have You Ever Felt You Ought to Cut Down on Your Drinking or Drug Use?: Yes Have People Annoyed You By Critizing Your Drinking Or Drug Use?: No Have You Felt Bad Or Guilty About Your Drinking Or Drug Use?: Yes Have You Ever Had a Drink or Used Drugs First Thing In The Morning to Steady Your Nerves or to Get Rid of a Hangover?: No CAGE-AID Score: 2  Substance Abuse Education Offered: Yes  Substance abuse interventions: Patient Counseling, Scientist, clinical (histocompatibility and immunogenetics)

## 2021-04-06 NOTE — ED Notes (Addendum)
Admitting paged per 3W request about pt's blood pressure

## 2021-04-14 ENCOUNTER — Encounter: Payer: Medicaid Other | Admitting: Student

## 2021-04-16 ENCOUNTER — Encounter (HOSPITAL_COMMUNITY): Payer: Self-pay | Admitting: Emergency Medicine

## 2021-04-16 ENCOUNTER — Emergency Department (HOSPITAL_COMMUNITY): Payer: Medicaid Other

## 2021-04-16 ENCOUNTER — Other Ambulatory Visit: Payer: Self-pay

## 2021-04-16 ENCOUNTER — Emergency Department (HOSPITAL_COMMUNITY)
Admission: EM | Admit: 2021-04-16 | Discharge: 2021-04-16 | Disposition: A | Discharge status: Home / Self Care | Payer: Medicaid Other | Attending: Emergency Medicine | Admitting: Emergency Medicine

## 2021-04-16 DIAGNOSIS — I10 Essential (primary) hypertension: Secondary | ICD-10-CM | POA: Insufficient documentation

## 2021-04-16 DIAGNOSIS — R072 Precordial pain: Secondary | ICD-10-CM

## 2021-04-16 DIAGNOSIS — Z79899 Other long term (current) drug therapy: Secondary | ICD-10-CM | POA: Diagnosis not present

## 2021-04-16 DIAGNOSIS — E119 Type 2 diabetes mellitus without complications: Secondary | ICD-10-CM | POA: Diagnosis not present

## 2021-04-16 DIAGNOSIS — F17210 Nicotine dependence, cigarettes, uncomplicated: Secondary | ICD-10-CM | POA: Insufficient documentation

## 2021-04-16 DIAGNOSIS — Z20822 Contact with and (suspected) exposure to covid-19: Secondary | ICD-10-CM | POA: Insufficient documentation

## 2021-04-16 DIAGNOSIS — R000 Tachycardia, unspecified: Secondary | ICD-10-CM | POA: Insufficient documentation

## 2021-04-16 DIAGNOSIS — Z5900 Homelessness unspecified: Secondary | ICD-10-CM | POA: Diagnosis not present

## 2021-04-16 DIAGNOSIS — Z7982 Long term (current) use of aspirin: Secondary | ICD-10-CM | POA: Diagnosis not present

## 2021-04-16 DIAGNOSIS — R079 Chest pain, unspecified: Secondary | ICD-10-CM | POA: Diagnosis present

## 2021-04-16 DIAGNOSIS — R1013 Epigastric pain: Secondary | ICD-10-CM | POA: Diagnosis not present

## 2021-04-16 DIAGNOSIS — K709 Alcoholic liver disease, unspecified: Secondary | ICD-10-CM

## 2021-04-16 DIAGNOSIS — K2900 Acute gastritis without bleeding: Secondary | ICD-10-CM

## 2021-04-16 DIAGNOSIS — R1010 Upper abdominal pain, unspecified: Secondary | ICD-10-CM

## 2021-04-16 DIAGNOSIS — Z87898 Personal history of other specified conditions: Secondary | ICD-10-CM

## 2021-04-16 LAB — BASIC METABOLIC PANEL
Anion gap: 12 (ref 5–15)
BUN: 18 mg/dL (ref 6–20)
CO2: 22 mmol/L (ref 22–32)
Calcium: 8.7 mg/dL — ABNORMAL LOW (ref 8.9–10.3)
Chloride: 100 mmol/L (ref 98–111)
Creatinine, Ser: 0.78 mg/dL (ref 0.61–1.24)
GFR, Estimated: >60 mL/min (ref >60)
Glucose, Bld: 91 mg/dL (ref 70–99)
Potassium: 4.1 mmol/L (ref 3.5–5.1)
Sodium: 134 mmol/L — ABNORMAL LOW (ref 135–145)

## 2021-04-16 LAB — HEPATIC FUNCTION PANEL
ALT: 137 U/L — ABNORMAL HIGH (ref 0–44)
AST: 202 U/L — ABNORMAL HIGH (ref 15–41)
Albumin: 3.4 g/dL — ABNORMAL LOW (ref 3.5–5.0)
Alkaline Phosphatase: 242 U/L — ABNORMAL HIGH (ref 38–126)
Bilirubin, Direct: 0.4 mg/dL — ABNORMAL HIGH (ref 0.0–0.2)
Indirect Bilirubin: 0.5 mg/dL (ref 0.3–0.9)
Total Bilirubin: 0.9 mg/dL (ref 0.3–1.2)
Total Protein: 7.4 g/dL (ref 6.5–8.1)

## 2021-04-16 LAB — CBC
HCT: 39.8 % (ref 39.0–52.0)
Hemoglobin: 13.3 g/dL (ref 13.0–17.0)
MCH: 32.7 pg (ref 26.0–34.0)
MCHC: 33.4 g/dL (ref 30.0–36.0)
MCV: 97.8 fL (ref 80.0–100.0)
Platelets: 267 10*3/uL (ref 150–400)
RBC: 4.07 MIL/uL — ABNORMAL LOW (ref 4.22–5.81)
RDW: 14.5 % (ref 11.5–15.5)
WBC: 4.1 10*3/uL (ref 4.0–10.5)
nRBC: 0 % (ref 0.0–0.2)

## 2021-04-16 LAB — RESP PANEL BY RT-PCR (FLU A&B, COVID) ARPGX2
Influenza A by PCR: NEGATIVE
Influenza B by PCR: NEGATIVE
SARS Coronavirus 2 by RT PCR: NEGATIVE

## 2021-04-16 LAB — TROPONIN I (HIGH SENSITIVITY)
Troponin I (High Sensitivity): 17 ng/L (ref <18)
Troponin I (High Sensitivity): 19 ng/L — ABNORMAL HIGH (ref <18)

## 2021-04-16 LAB — LIPASE, BLOOD: Lipase: 28 U/L (ref 11–51)

## 2021-04-16 MED ORDER — ALUM & MAG HYDROXIDE-SIMETH 200-200-20 MG/5ML PO SUSP
30.0000 mL | Freq: Once | ORAL | Status: AC
  Administered 2021-04-16: 30 mL via ORAL
  Filled 2021-04-16: qty 30

## 2021-04-16 MED ORDER — LORAZEPAM 1 MG PO TABS
1.0000 mg | ORAL_TABLET | Freq: Once | ORAL | Status: AC
  Administered 2021-04-16: 1 mg via ORAL
  Filled 2021-04-16: qty 1

## 2021-04-16 MED ORDER — PANTOPRAZOLE SODIUM 40 MG PO TBEC: 40.0000 mg | DELAYED_RELEASE_TABLET | Freq: Every day | ORAL | 0 refills | Status: DC

## 2021-04-16 MED ORDER — FAMOTIDINE 20 MG PO TABS
20.0000 mg | ORAL_TABLET | Freq: Once | ORAL | Status: AC
  Administered 2021-04-16: 20 mg via ORAL
  Filled 2021-04-16: qty 1

## 2021-04-16 MED ORDER — ACETAMINOPHEN 500 MG PO TABS
1000.0000 mg | ORAL_TABLET | Freq: Once | ORAL | Status: AC
  Administered 2021-04-16: 1000 mg via ORAL
  Filled 2021-04-16: qty 2
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
## 2021-04-16 NOTE — Discharge Instructions (Addendum)
It was our pleasure to provide your ER care today - we hope that you feel better.  Take protonix (acid blocker medication). You may also take pepcid and maalox for symptom relief.  Avoid alcohol use - see resource guide provided for alcohol treatment programs.   Follow up closely with primary care doctor in the coming week. For recent chest pain, follow up with cardiologist in the next 1-2 weeks.   Return to ER if worse, new symptoms, fevers, increased trouble breathing, recurrent or persistent chest pain, severe abdominal pain, persistent vomiting, or other emergency concern.   You were given meds in the ER - no driving for the next 8 hours.

## 2021-04-16 NOTE — ED Provider Notes (Signed)
Emergency Medicine Provider Triage Evaluation Note  Devin [name] , a 60 y.o. male  was evaluated in triage.  Pt complains of chest pain. States that same began this morning while drinking coffee. Endorses associated shortness of breath.  States he has a history of MI.  Was out in the cold this morning and thought maybe that caused this chest pain.  Patient also endorses ETOH use this morning with several beers, history limited by intoxication.  Review of Systems  Positive:  Negative: See above  Physical Exam  BP (!) 162/105 (BP Location: Right Arm)    Pulse (!) 106    Temp (!) 97.3 F (36.3 C) (Oral)    Resp 18    SpO2 98%  Gen:   Awake, no distress   Resp:  Normal effort  MSK:   Moves extremities without difficulty  Other:    Medical Decision Making  Medically screening exam initiated at 12:49 PM.  Appropriate orders placed.  Devin [name] was informed that the remainder of the evaluation will be completed by another provider, this initial triage assessment does not replace that evaluation, and the importance of remaining in the ED until their evaluation is complete.  Level 5 caveat -- intoxication   Nestor Lewandowsky 04/16/21 1251    Fredia Sorrow, MD 04/20/21 1319

## 2021-04-16 NOTE — ED Triage Notes (Addendum)
Pt to triage via [facility].  Pt homeless.  Reports intermittent chest pain that started this morning, L leg pain, and being cold. + ETOH. Pt on phone talking on arrival.

## 2021-04-28 ENCOUNTER — Inpatient Hospital Stay (INDEPENDENT_AMBULATORY_CARE_PROVIDER_SITE_OTHER): Payer: Medicaid Other | Admitting: Primary Care

## 2021-06-29 ENCOUNTER — Other Ambulatory Visit: Payer: Self-pay

## 2023-04-10 NOTE — Progress Notes (Signed)
  Subjective Patient ID: Devin [name] is a 62 y.o. male.  No chief complaint on file.   The following information was reviewed by members of the visit team:  Tobacco  Allergies  Meds  Problems  Surg Hx  Fam Hx      HPI Patient presents today for follow-up on blood pressure.  He states he is tolerating lisinopril without any problem.  He states that sildenafil  was not really helpful at all for erectile dysfunction and asks for something stronger.  He states he has a cyst on the right side of his neck that will drain foul-smelling material every once in a while and then it will heal and he asks about that. Review of Systems  Objective Physical Exam Vitals:   04/10/23 0931  BP: 128/84  Pulse: 80  Temp: 98.1 F (36.7 C)  SpO2: 98%  Patient is not in any distress.  Neck is supple without adenopathy.  He has a 1 cm diameter mobile palpable sebaceous cyst on the right side of his neck.  Heart has regular rate and rhythm without murmur gallop or rub.  Lungs are clear to auscultation and extremities are without edema  Assessment/Plan Diagnoses and all orders for this visit:  Erectile dysfunction, unspecified erectile dysfunction type -     Ambulatory referral to Urology; Future Discussed that we could always try different formulation/brand of medication but as he did not have any response and had difficulty with insurance coverage on sildenafil , refer to urology discussed other options. Primary hypertension Continue current regimen including lisinopril 20 mg daily and metoprolol  100 twice daily Sebaceous cyst Reassurance.  Discussed we can always try to remove it or treat with antibiotics if it becomes infected.  We can always refer to dermatology Other orders -     allopurinoL  (ZYLOPRIM ) 300 mg tablet; Take 1 tablet (300 mg total) by mouth daily. -     lisinopriL (PRINIVIL) 20 mg tablet; Take 1 tablet (20 mg total) by mouth daily. -     metoprolol  tartrate (LOPRESSOR ) 100 mg  tablet; Take 1 tablet (100 mg total) by mouth 2 (two) times a day. See him back in 6 months, sooner as needed  Electronically signed: Harlene KATHEE Albino, PA 04/10/2023  12:58 PM

## 2023-09-05 ENCOUNTER — Other Ambulatory Visit: Payer: Self-pay

## 2023-09-05 ENCOUNTER — Emergency Department (HOSPITAL_COMMUNITY)

## 2023-09-05 ENCOUNTER — Emergency Department (HOSPITAL_COMMUNITY)
Admission: EM | Admit: 2023-09-05 | Discharge: 2023-09-05 | Disposition: A | Discharge status: Home / Self Care | Attending: Emergency Medicine | Admitting: Emergency Medicine

## 2023-09-05 ENCOUNTER — Encounter (HOSPITAL_COMMUNITY): Payer: Self-pay

## 2023-09-05 DIAGNOSIS — K209 Esophagitis, unspecified without bleeding: Secondary | ICD-10-CM | POA: Diagnosis not present

## 2023-09-05 DIAGNOSIS — I10 Essential (primary) hypertension: Secondary | ICD-10-CM | POA: Insufficient documentation

## 2023-09-05 DIAGNOSIS — F17210 Nicotine dependence, cigarettes, uncomplicated: Secondary | ICD-10-CM | POA: Insufficient documentation

## 2023-09-05 DIAGNOSIS — Z79899 Other long term (current) drug therapy: Secondary | ICD-10-CM | POA: Insufficient documentation

## 2023-09-05 DIAGNOSIS — N179 Acute kidney failure, unspecified: Secondary | ICD-10-CM | POA: Diagnosis not present

## 2023-09-05 DIAGNOSIS — R0789 Other chest pain: Secondary | ICD-10-CM | POA: Diagnosis present

## 2023-09-05 LAB — I-STAT CG4 LACTIC ACID, ED
Lactic Acid, Venous: 2.6 mmol/L (ref 0.5–1.9)
Lactic Acid, Venous: 1.6 mmol/L (ref 0.5–1.9)

## 2023-09-05 LAB — CBC WITH DIFFERENTIAL/PLATELET
Abs Immature Granulocytes: 0.02 10*3/uL (ref 0.00–0.07)
Basophils Absolute: 0 10*3/uL (ref 0.0–0.1)
Basophils Relative: 1 %
Eosinophils Absolute: 0 10*3/uL (ref 0.0–0.5)
Eosinophils Relative: 1 %
HCT: 40.4 % (ref 39.0–52.0)
Hemoglobin: 14.3 g/dL (ref 13.0–17.0)
Immature Granulocytes: 0 %
Lymphocytes Relative: 31 %
Lymphs Abs: 1.6 10*3/uL (ref 0.7–4.0)
MCH: 34.3 pg — ABNORMAL HIGH (ref 26.0–34.0)
MCHC: 35.4 g/dL (ref 30.0–36.0)
MCV: 96.9 fL (ref 80.0–100.0)
Monocytes Absolute: 0.7 10*3/uL (ref 0.1–1.0)
Monocytes Relative: 14 %
Neutro Abs: 2.6 10*3/uL (ref 1.7–7.7)
Neutrophils Relative %: 53 %
Platelets: 256 10*3/uL (ref 150–400)
RBC: 4.17 MIL/uL — ABNORMAL LOW (ref 4.22–5.81)
RDW: 13.3 % (ref 11.5–15.5)
WBC: 5 10*3/uL (ref 4.0–10.5)
nRBC: 0 % (ref 0.0–0.2)

## 2023-09-05 LAB — URINALYSIS, ROUTINE W REFLEX MICROSCOPIC
Bacteria, UA: NONE SEEN
Bilirubin Urine: NEGATIVE
Glucose, UA: 50 mg/dL — AB
Hgb urine dipstick: NEGATIVE
Ketones, ur: 20 mg/dL — AB
Leukocytes,Ua: NEGATIVE
Nitrite: NEGATIVE
Protein, ur: 30 mg/dL — AB
Specific Gravity, Urine: 1.045 — ABNORMAL HIGH (ref 1.005–1.030)
pH: 5 (ref 5.0–8.0)

## 2023-09-05 LAB — RAPID URINE DRUG SCREEN, HOSP PERFORMED
Amphetamines: NOT DETECTED
Barbiturates: NOT DETECTED
Benzodiazepines: NOT DETECTED
Cocaine: POSITIVE — AB
Opiates: POSITIVE — AB
Tetrahydrocannabinol: POSITIVE — AB

## 2023-09-05 LAB — COMPREHENSIVE METABOLIC PANEL WITH GFR
ALT: 34 U/L (ref 0–44)
AST: 52 U/L — ABNORMAL HIGH (ref 15–41)
Albumin: 3.9 g/dL (ref 3.5–5.0)
Alkaline Phosphatase: 80 U/L (ref 38–126)
Anion gap: 17 — ABNORMAL HIGH (ref 5–15)
BUN: 10 mg/dL (ref 8–23)
CO2: 16 mmol/L — ABNORMAL LOW (ref 22–32)
Calcium: 9.2 mg/dL (ref 8.9–10.3)
Chloride: 105 mmol/L (ref 98–111)
Creatinine, Ser: 1.7 mg/dL — ABNORMAL HIGH (ref 0.61–1.24)
GFR, Estimated: 45 mL/min — ABNORMAL LOW (ref >60)
Glucose, Bld: 143 mg/dL — ABNORMAL HIGH (ref 70–99)
Potassium: 4.3 mmol/L (ref 3.5–5.1)
Sodium: 138 mmol/L (ref 135–145)
Total Bilirubin: 1.2 mg/dL (ref 0.0–1.2)
Total Protein: 7.3 g/dL (ref 6.5–8.1)

## 2023-09-05 LAB — TYPE AND SCREEN
ABO/RH(D): O POS
Antibody Screen: NEGATIVE

## 2023-09-05 LAB — PROTIME-INR
INR: 1.2 (ref 0.8–1.2)
Prothrombin Time: 15.8 s — ABNORMAL HIGH (ref 11.4–15.2)

## 2023-09-05 LAB — TROPONIN I (HIGH SENSITIVITY)
Troponin I (High Sensitivity): 22 ng/L — ABNORMAL HIGH (ref <18)
Troponin I (High Sensitivity): 31 ng/L — ABNORMAL HIGH (ref <18)

## 2023-09-05 MED ORDER — ONDANSETRON HCL 4 MG/2ML IJ SOLN
4.0000 mg | Freq: Once | INTRAMUSCULAR | Status: AC
  Administered 2023-09-05: 4 mg via INTRAVENOUS
  Filled 2023-09-05: qty 2

## 2023-09-05 MED ORDER — ONDANSETRON HCL 4 MG PO TABS
4.0000 mg | ORAL_TABLET | Freq: Four times a day (QID) | ORAL | 0 refills | Status: DC
  Filled 2023-09-05: qty 12, 3d supply, fill #0

## 2023-09-05 MED ORDER — PANTOPRAZOLE SODIUM 40 MG PO TBEC
40.0000 mg | DELAYED_RELEASE_TABLET | Freq: Every day | ORAL | 0 refills | Status: DC
  Filled 2023-09-05: qty 30, 30d supply, fill #0

## 2023-09-05 MED ORDER — ALUM & MAG HYDROXIDE-SIMETH 200-200-20 MG/5ML PO SUSP
30.0000 mL | Freq: Once | ORAL | Status: AC
  Administered 2023-09-05: 30 mL via ORAL
  Filled 2023-09-05: qty 30

## 2023-09-05 MED ORDER — MORPHINE SULFATE (PF) 4 MG/ML IV SOLN
4.0000 mg | Freq: Once | INTRAVENOUS | Status: AC
  Administered 2023-09-05: 4 mg via INTRAVENOUS
  Filled 2023-09-05: qty 1

## 2023-09-05 MED ORDER — FAMOTIDINE 20 MG PO TABS
20.0000 mg | ORAL_TABLET | Freq: Once | ORAL | Status: AC
Start: 2023-09-05 — End: 2023-09-05
  Administered 2023-09-05: 20 mg via ORAL
  Filled 2023-09-05: qty 1

## 2023-09-05 MED ORDER — IOHEXOL 350 MG/ML SOLN
100.0000 mL | Freq: Once | INTRAVENOUS | Status: AC | PRN
  Administered 2023-09-05: 100 mL via INTRAVENOUS

## 2023-09-05 MED ORDER — LACTATED RINGERS IV BOLUS
1000.0000 mL | Freq: Once | INTRAVENOUS | Status: AC
Start: 2023-09-05 — End: 2023-09-05
  Administered 2023-09-05: 1000 mL via INTRAVENOUS

## 2023-09-05 MED ORDER — LACTATED RINGERS IV BOLUS
1000.0000 mL | Freq: Once | INTRAVENOUS | Status: AC
  Administered 2023-09-05: 1000 mL via INTRAVENOUS

## 2023-09-05 MED ORDER — PANTOPRAZOLE SODIUM 40 MG IV SOLR
40.0000 mg | Freq: Once | INTRAVENOUS | Status: AC
  Administered 2023-09-05: 40 mg via INTRAVENOUS
  Filled 2023-09-05: qty 10

## 2023-09-05 NOTE — Discharge Instructions (Addendum)
 You were seen in the emergency department for your nausea, vomiting and abdominal pain.  Your workup shows that you have esophagitis which is inflammation of your esophagus, likely related to your alcohol use and tobacco use.  I have given you an antacid and you should take this daily as prescribed.  You can take Zofran  as needed for nausea.  You can take over-the-counter Maalox or Pepcid  as needed for additional pain.  Your kidney function was elevated today from dehydration and you should have this rechecked by your primary doctor. You can follow up with GI for management of your esophagitis.  You should return to the emergency department if you have repetitive vomiting despite the nausea medicine, significantly worsening pain, you pass out or if you have any other new or concerning symptoms.

## 2023-09-05 NOTE — ED Triage Notes (Signed)
 Pt BIB [facility] from home with L sided CP radiating to back, N/V, epigastric pain, SHOB, rectal bleeding. Pt reports dark red blood in stool and coffee ground emesis. Pt endorses cardiac hx and hx of hemorrhoids. ASA, 1 nitroglycerin , 4 mg Zofran  given by EMS. Per EMS HR 70s-150s; when HR increases to 130s-150s, pt reports increased nausea. RR 30, all other VSS.

## 2023-09-05 NOTE — ED Provider Notes (Signed)
 [place] EMERGENCY DEPARTMENT AT [facility] Provider Note   CSN: [id number] Arrival date & time: 09/05/23  1418     History  Chief Complaint  Patient presents with   Chest Pain    Devin [name] is a 63 y.o. male.  HPI    63 year old male comes in with chief complaint of chest pain.  Patient has history of alcohol use disorder, alcoholic liver cirrhosis.  Patient states that he been having chest pain for the last 2 days.  Chest pain was intermittent, but starting this morning it has been fairly constant.  Pain is in the central chest area, radiates to the back.  He does not have similar history of pain.  Patient does not have any history of stomach ulcers or varices that he is aware of.  Today patient also has had nausea with emesis.  Emesis x 2.  Emesis did have slight blood in it.  He has also noted dark stools.  Home Medications Prior to Admission medications   Medication Sig Start Date End Date Taking? Authorizing Provider  allopurinol  (ZYLOPRIM ) 300 MG tablet Take 300 mg by mouth daily.   Yes [provider]  lisinopril (ZESTRIL) 20 MG tablet Take 20 mg by mouth daily.   Yes [provider]  metoprolol  succinate (TOPROL -XL) 50 MG 24 hr tablet Take 3 tablets (150 mg total) by mouth daily. 09/29/18  Yes Lawyer, Veryl Gottron, PA-C  ondansetron  (ZOFRAN ) 4 MG tablet Take 1 tablet (4 mg total) by mouth every 6 (six) hours. 09/05/23  Yes Nora Beal, Victoria K, DO  pantoprazole  (PROTONIX ) 40 MG tablet Take 1 tablet (40 mg total) by mouth daily. 09/05/23  Yes Kingsley, Victoria K, DO      Allergies    Patient has no known allergies.    Review of Systems   Review of Systems  All other systems reviewed and are negative.   Physical Exam Updated Vital Signs BP (!) 152/107   Pulse (!) 107   Temp 98.4 F (36.9 C) (Oral)   Resp 19   SpO2 100%  Physical Exam Vitals and nursing note reviewed.  Constitutional:      Appearance: He is well-developed.   HENT:     Head: Atraumatic.  Cardiovascular:     Rate and Rhythm: Tachycardia present.     Pulses:          Radial pulses are 2+ on the right side and 2+ on the left side.       Dorsalis pedis pulses are 2+ on the right side and 2+ on the left side.  Pulmonary:     Effort: Pulmonary effort is normal.     Breath sounds: Normal breath sounds.  Musculoskeletal:     Cervical back: Neck supple.  Skin:    General: Skin is warm.  Neurological:     Mental Status: He is alert and oriented to person, place, and time.     ED Results / Procedures / Treatments   Labs (all labs ordered are listed, but only abnormal results are displayed) Labs Reviewed  COMPREHENSIVE METABOLIC PANEL WITH GFR - Abnormal; Notable for the following components:      Result Value   CO2 16 (*)    Glucose, Bld 143 (*)    Creatinine, Ser 1.70 (*)    AST 52 (*)    GFR, Estimated 45 (*)    Anion gap 17 (*)    All other components within normal limits  CBC WITH  DIFFERENTIAL/PLATELET - Abnormal; Notable for the following components:   RBC 4.17 (*)    MCH 34.3 (*)    All other components within normal limits  PROTIME-INR - Abnormal; Notable for the following components:   Prothrombin Time 15.8 (*)    All other components within normal limits  RAPID URINE DRUG SCREEN, HOSP PERFORMED - Abnormal; Notable for the following components:   Opiates POSITIVE (*)    Cocaine POSITIVE (*)    Tetrahydrocannabinol POSITIVE (*)    All other components within normal limits  URINALYSIS, ROUTINE W REFLEX MICROSCOPIC - Abnormal; Notable for the following components:   Specific Gravity, Urine 1.045 (*)    Glucose, UA 50 (*)    Ketones, ur 20 (*)    Protein, ur 30 (*)    All other components within normal limits  I-STAT CG4 LACTIC ACID, ED - Abnormal; Notable for the following components:   Lactic Acid, Venous 2.6 (*)    All other components within normal limits  TROPONIN I (HIGH SENSITIVITY) - Abnormal; Notable for the  following components:   Troponin I (High Sensitivity) 22 (*)    All other components within normal limits  TROPONIN I (HIGH SENSITIVITY) - Abnormal; Notable for the following components:   Troponin I (High Sensitivity) 31 (*)    All other components within normal limits  I-STAT CG4 LACTIC ACID, ED  TYPE AND SCREEN    EKG EKG Interpretation Date/Time:  Wednesday Sep 05 2023 14:25:01 EDT Ventricular Rate:  110 PR Interval:  140 QRS Duration:  76 QT Interval:  360 QTC Calculation: 400 R Axis:   33  Text Interpretation: Sinus tachycardia Multiform ventricular premature complexes Probable left atrial enlargement PVCs otherwise unchanged from prior EKG Confirmed by Celesta Coke ([id number]) on 09/05/2023 3:13:40 PM  Radiology No results found.  Procedures Procedures    Medications Ordered in ED Medications  morphine  (PF) 4 MG/ML injection 4 mg (4 mg Intravenous Given 09/05/23 1453)  pantoprazole  (PROTONIX ) injection 40 mg (40 mg Intravenous Given 09/05/23 1453)  ondansetron  (ZOFRAN ) injection 4 mg (4 mg Intravenous Given 09/05/23 1452)  morphine  (PF) 4 MG/ML injection 4 mg (4 mg Intravenous Given 09/05/23 1611)  lactated ringers  bolus 1,000 mL (0 mLs Intravenous Stopped 09/05/23 1851)  iohexol  (OMNIPAQUE ) 350 MG/ML injection 100 mL (100 mLs Intravenous Contrast Given 09/05/23 1645)  famotidine  (PEPCID ) tablet 20 mg (20 mg Oral Given 09/05/23 1847)  alum & mag hydroxide-simeth (MAALOX/MYLANTA) 200-200-20 MG/5ML suspension 30 mL (30 mLs Oral Given 09/05/23 1847)  lactated ringers  bolus 1,000 mL (0 mLs Intravenous Stopped 09/05/23 2237)    ED Course/ Medical Decision Making/ A&P Clinical Course as of 09/11/23 0003  Wed Sep 05, 2023  1638 Mildly elevated troponin, will need repeat at 2 hour. AKI with Cr 1.7 from baseline normal 2 years ago with mildly elevated AGAP. Will add on fluids and lactic. Hgb normal without signs of significant blood loss. [VK]  1815 CT imaging appears consistent with  esophagitis. Patient still having pain, repeat troponin is essentially flat. Does have an elevated lactic, will need repeat after IVF. [VK]  2003 Patient reports pain improving though not completely resolved. He's tolerating PO. Still tachy, UA positive for ketones so concern for ongoing dehydration. Will continue to fluid resuscitate and reassess for disposition. [VK]  2152 HR improving and patient's symptoms improved. He's stable for dc home and will be given PCP and GI follow up. [VK]    Clinical Course User Index [VK] Kingsley, Victoria K, DO  Medical Decision Making Amount and/or Complexity of Data Reviewed Labs: ordered. Radiology: ordered.  Risk OTC drugs. Prescription drug management.   This patient presents to the ED with chief complaint(s) of chest pain radiating to the back with pertinent past medical history of alcohol use disorder, tobacco use disorder, liver cirrhosis, esophagitis.The complaint involves an extensive differential diagnosis and also carries with it a high risk of complications and morbidity.    The differential diagnosis considered for this patient includes  ACS syndrome Aortic dissection Myocarditis Pericarditis Endocarditis Pericardial effusion / tamponade Pneumonia Pleural effusion / Pulmonary edema PE Pneumothorax Musculoskeletal pain PUD / Gastritis / Esophagitis Esophageal spasm Variceal bleeding Duodenal ulcer bleeding  The initial plan is to get basic labs.  Patient appears uncomfortable.  I reviewed patient's records.  He had a CT dissection study in 2022 which was reassuring.  Patient states that he smokes less than 1 pack a day.  He occasionally will use cocaine.  He has normal and reassuring vascular exam.  Additionally I reviewed patient's upper endoscopy in the past.  He has esophagitis.  No evidence of varices, but that studies from 2019.  Additional history obtained: Records reviewed previous CT,  upper endoscopy.   Plan is to focus on symptom management. Patient's care will be signed out to incoming team. For now we will give him IV PPI, IV octreotide and IV pain medications.  If patient's symptoms do not respond, then he will benefit with CT angiogram for upper GI bleed.  Final Clinical Impression(s) / ED Diagnoses Final diagnoses:  Esophagitis  AKI (acute kidney injury) (HCC)    Rx / DC Orders ED Discharge Orders          Ordered    pantoprazole  (PROTONIX ) 40 MG tablet  Daily        09/05/23 2153    ondansetron  (ZOFRAN ) 4 MG tablet  Every 6 hours        09/05/23 2153              Deatra Face, MD 09/11/23 0004

## 2023-09-05 NOTE — ED Provider Notes (Signed)
 Patient signed out to me at 1500 by Dr. Lewis Red pending labs and CT imaging.  In short this is a 63 year old male with a past medical history of hypertension, hyperlipidemia, alcohol use disorder, cirrhosis presenting to the emergency department with chest pain, nausea, vomiting and abdominal pain.  Reported hematemesis and dark red blood in the stools today.  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  He is tachycardic and uncomfortable appearing on arrival in no acute distress.  EKG had no acute ischemic changes.  Patient's labs and CT imaging are pending at this time.  He has received Zofran , Protonix  and morphine  for symptomatic management and will be closely reassessed.  Clinical Course as of 09/05/23 2207  Wed Sep 05, 2023  1638 Mildly elevated troponin, will need repeat at 2 hour. AKI with Cr 1.7 from baseline normal 2 years ago with mildly elevated AGAP. Will add on fluids and lactic. Hgb normal without signs of significant blood loss. [VK]  1815 CT imaging appears consistent with esophagitis. Patient still having pain, repeat troponin is essentially flat. Does have an elevated lactic, will need repeat after IVF. [VK]  2003 Patient reports pain improving though not completely resolved. He's tolerating PO. Still tachy, UA positive for ketones so concern for ongoing dehydration. Will continue to fluid resuscitate and reassess for disposition. [VK]  2152 HR improving and patient's symptoms improved. He's stable for dc home and will be given PCP and GI follow up. [VK]    Clinical Course User Index [VK] Kingsley, Royer Cristobal K, DO      Kingsley, Ladena Jacquez K, [place] 09/05/23 2207

## 2023-09-06 ENCOUNTER — Other Ambulatory Visit (HOSPITAL_COMMUNITY): Payer: Self-pay

## 2023-09-07 ENCOUNTER — Other Ambulatory Visit (HOSPITAL_COMMUNITY): Payer: Self-pay

## 2023-12-17 ENCOUNTER — Emergency Department (HOSPITAL_COMMUNITY)

## 2023-12-17 ENCOUNTER — Other Ambulatory Visit: Payer: Self-pay

## 2023-12-17 ENCOUNTER — Emergency Department (HOSPITAL_COMMUNITY): Admission: EM | Admit: 2023-12-17 | Discharge: 2023-12-18 | Disposition: A | Discharge status: Home / Self Care

## 2023-12-17 DIAGNOSIS — F10920 Alcohol use, unspecified with intoxication, uncomplicated: Secondary | ICD-10-CM | POA: Diagnosis not present

## 2023-12-17 DIAGNOSIS — R079 Chest pain, unspecified: Secondary | ICD-10-CM | POA: Insufficient documentation

## 2023-12-17 DIAGNOSIS — R0602 Shortness of breath: Secondary | ICD-10-CM | POA: Insufficient documentation

## 2023-12-17 DIAGNOSIS — Y908 Blood alcohol level of 240 mg/100 ml or more: Secondary | ICD-10-CM | POA: Diagnosis not present

## 2023-12-17 LAB — CBC
HCT: 37.4 % — ABNORMAL LOW (ref 39.0–52.0)
Hemoglobin: 12.5 g/dL — ABNORMAL LOW (ref 13.0–17.0)
MCH: 33.2 pg (ref 26.0–34.0)
MCHC: 33.4 g/dL (ref 30.0–36.0)
MCV: 99.5 fL (ref 80.0–100.0)
Platelets: 247 K/uL (ref 150–400)
RBC: 3.76 MIL/uL — ABNORMAL LOW (ref 4.22–5.81)
RDW: 15.1 % (ref 11.5–15.5)
WBC: 6.6 K/uL (ref 4.0–10.5)
nRBC: 0 % (ref 0.0–0.2)

## 2023-12-17 LAB — TROPONIN I (HIGH SENSITIVITY): Troponin I (High Sensitivity): 14 ng/L (ref <18)

## 2023-12-17 LAB — BASIC METABOLIC PANEL WITH GFR
Anion gap: 12 (ref 5–15)
BUN: 15 mg/dL (ref 8–23)
CO2: 19 mmol/L — ABNORMAL LOW (ref 22–32)
Calcium: 8.1 mg/dL — ABNORMAL LOW (ref 8.9–10.3)
Chloride: 103 mmol/L (ref 98–111)
Creatinine, Ser: 1.27 mg/dL — ABNORMAL HIGH (ref 0.61–1.24)
GFR, Estimated: >60 mL/min (ref >60)
Glucose, Bld: 77 mg/dL (ref 70–99)
Potassium: 3.6 mmol/L (ref 3.5–5.1)
Sodium: 134 mmol/L — ABNORMAL LOW (ref 135–145)

## 2023-12-17 LAB — D-DIMER, QUANTITATIVE: D-Dimer, Quant: 0.72 ug{FEU}/mL — ABNORMAL HIGH (ref 0.00–0.50)

## 2023-12-17 LAB — CBG MONITORING, ED: Glucose-Capillary: 78 mg/dL (ref 70–99)

## 2023-12-17 MED ORDER — LACTATED RINGERS IV BOLUS
1000.0000 mL | Freq: Once | INTRAVENOUS | Status: AC
  Administered 2023-12-17: 1000 mL via INTRAVENOUS

## 2023-12-17 NOTE — ED Notes (Signed)
 Pt taken to CT.

## 2023-12-17 NOTE — ED Provider Notes (Signed)
 [facility] DEPT [facility] Emergency Department Provider Note MRN:  [id number]  Arrival date & time: 12/18/23     Chief Complaint   Chest Pain   History of Present Illness   Devin [name] is a 63 y.o. year-old male presents to the ED with chief complaint of chest pain.  States that he also passed out.  Patient BIB EMS.  States chest pain started tonight and caused him to fall out.  States that she feels SOB.  States that he has been drinking tonight, but can't quantify.  Patient mainly unable to provide adequate hx due to seemingly being intoxicated.  EMS had reported that he said he fell.  He told EMS that pain didn't radiate.  History provided by patient.   Review of Systems  Pertinent positive and negative review of systems noted in HPI.    Physical Exam   Vitals:   12/18/23 0534 12/18/23 0545  BP:  105/60  Pulse:  87  Resp:  12  Temp: 97.7 F (36.5 C)   SpO2:  95%    CONSTITUTIONAL:  non toxic-appearing, NAD NEURO:  Alert and oriented x 3, CN 3-12 grossly intact EYES:  eyes equal and reactive ENT/NECK:  Supple, no stridor  CARDIO:  normal rate, regular rhythm, appears well-perfused  PULM:  No respiratory distress, CTAB GI/GU:  non-distended, no focal abdominal tenderness MSK/SPINE:  No gross deformities, no edema, moves all extremities  SKIN:  no rash, atraumatic   *Additional and/or pertinent findings included in MDM below  Diagnostic and Interventional Summary    EKG Interpretation Date/Time:  Tuesday December 18 2023 01:52:55 EDT Ventricular Rate:  91 PR Interval:  182 QRS Duration:  98 QT Interval:  343 QTC Calculation: 422 R Axis:   36  Text Interpretation: Sinus rhythm Probable left atrial enlargement Abnormal R-wave progression, early transition Nonspecific T abnormalities, diffuse leads Confirmed by Theadore Sharper [phone]) on 12/18/2023 1:58:10 AM       Labs Reviewed  BASIC METABOLIC PANEL WITH GFR - Abnormal; Notable for the  following components:      Result Value   Sodium 134 (*)    CO2 19 (*)    Creatinine, Ser 1.27 (*)    Calcium  8.1 (*)    All other components within normal limits  CBC - Abnormal; Notable for the following components:   RBC 3.76 (*)    Hemoglobin 12.5 (*)    HCT 37.4 (*)    All other components within normal limits  D-DIMER, QUANTITATIVE - Abnormal; Notable for the following components:   D-Dimer, Quant 0.72 (*)    All other components within normal limits  ETHANOL - Abnormal; Notable for the following components:   Alcohol, Ethyl (B) 280 (*)    All other components within normal limits  CBG MONITORING, ED  TROPONIN I (HIGH SENSITIVITY)  TROPONIN I (HIGH SENSITIVITY)  TROPONIN I (HIGH SENSITIVITY)    CT Angio Chest PE W and/or Wo Contrast  Final Result    DG Chest 2 View  Final Result      Medications  lactated ringers  bolus 1,000 mL (0 mLs Intravenous Stopped 12/18/23 0411)  iohexol  (OMNIPAQUE ) 350 MG/ML injection 75 mL (75 mLs Intravenous Contrast Given 12/18/23 0003)  lactated ringers  bolus 1,000 mL (1,000 mLs Intravenous New Bag/Given 12/18/23 0414)     Procedures  /  Critical Care Procedures  ED Course and Medical Decision Making  I have reviewed the triage vital signs, the nursing notes, and pertinent available  records from the EMR.  Social Determinants Affecting Complexity of Care: Patient has no clinically significant social determinants affecting this chief complaint..   ED Course:    Medical Decision Making Patient here complaining of chest pain.  Seems intoxicated of alcohol.  Will check labs, imaging, EKG, and reassess.  Troponins are negative x 3, no new acute ischemic EKG changes.  D-dimer slightly elevated at 0.7, PE thought less likely, but due to elevated D-dimer, will check CT PE.  CT scan negative for PE, shows trace pleural effusion.  This could be contributing to the patient's symptoms.  Patient has been afebrile.  He denies cough.  His  oxygen saturation has been normal.  He is not tachycardic.  Blood pressure did drop while he was asleep, but corrected when he woke up.  He has been able to eat and drink in the ED.  He has been able to ambulate.  Given his reassuring workup, doubt ACS, doubt PE, doubt dissection.  No evidence to suggest acute pneumonia on CT.  He is afebrile, no cough, no leukocytosis.  Will plan for discharge home.  Recommend PCP follow-up.  Amount and/or Complexity of Data Reviewed Labs: ordered. Radiology: ordered. ECG/medicine tests: ordered.  Risk Prescription drug management.         Consultants: No consultations were needed in caring for this patient.   Treatment and Plan: I considered admission due to patient's initial presentation, but after considering the examination and diagnostic results, patient will not require admission and can be discharged with outpatient follow-up.    Final Clinical Impressions(s) / ED Diagnoses     ICD-10-CM   1. Alcoholic intoxication without complication (HCC)  F10.920     2. Nonspecific chest pain  R07.9       ED Discharge Orders     None         Discharge Instructions Discussed with and Provided to Patient:     Discharge Instructions      Your labs and imaging looked good.  Please follow-up with your regular doctor.  Please return for new or worsening symptoms.       Vicky Charleston, PA-C 12/18/23 [id number]    Theadore Ozell HERO, MD 12/18/23 [phone]

## 2023-12-17 NOTE — ED Triage Notes (Signed)
 BIBA d/t centralized cp that started a few minutes ago. Denies radiation. Per EMS pt is intoxicated. BGL 66. Glucose gel give PTA. BGL 78 on arrival

## 2023-12-17 NOTE — ED Notes (Signed)
 Pt using urinal then to xray

## 2023-12-18 ENCOUNTER — Emergency Department (HOSPITAL_COMMUNITY)

## 2023-12-18 ENCOUNTER — Encounter (HOSPITAL_COMMUNITY): Payer: Self-pay | Admitting: *Deleted

## 2023-12-18 LAB — TROPONIN I (HIGH SENSITIVITY)
Troponin I (High Sensitivity): 15 ng/L (ref <18)
Troponin I (High Sensitivity): 16 ng/L (ref <18)

## 2023-12-18 LAB — ETHANOL: Alcohol, Ethyl (B): 280 mg/dL — ABNORMAL HIGH (ref <15)

## 2023-12-18 MED ORDER — IOHEXOL 350 MG/ML SOLN
75.0000 mL | Freq: Once | INTRAVENOUS | Status: AC | PRN
  Administered 2023-12-18: 75 mL via INTRAVENOUS

## 2023-12-18 MED ORDER — LACTATED RINGERS IV BOLUS
1000.0000 mL | Freq: Once | INTRAVENOUS | Status: AC
  Administered 2023-12-18: 1000 mL via INTRAVENOUS

## 2023-12-18 NOTE — Discharge Instructions (Addendum)
 Your labs and imaging looked good.  Please follow-up with your regular doctor.  Please return for new or worsening symptoms.

## 2023-12-21 ENCOUNTER — Encounter (HOSPITAL_COMMUNITY): Payer: Self-pay | Admitting: Emergency Medicine

## 2023-12-21 ENCOUNTER — Emergency Department (HOSPITAL_COMMUNITY)

## 2023-12-21 ENCOUNTER — Other Ambulatory Visit: Payer: Self-pay

## 2023-12-21 ENCOUNTER — Emergency Department (HOSPITAL_COMMUNITY)
Admission: EM | Admit: 2023-12-21 | Discharge: 2023-12-21 | Disposition: A | Discharge status: Home / Self Care | Attending: Emergency Medicine | Admitting: Emergency Medicine

## 2023-12-21 DIAGNOSIS — R55 Syncope and collapse: Secondary | ICD-10-CM | POA: Insufficient documentation

## 2023-12-21 DIAGNOSIS — R059 Cough, unspecified: Secondary | ICD-10-CM | POA: Insufficient documentation

## 2023-12-21 DIAGNOSIS — I10 Essential (primary) hypertension: Secondary | ICD-10-CM | POA: Insufficient documentation

## 2023-12-21 DIAGNOSIS — R079 Chest pain, unspecified: Secondary | ICD-10-CM | POA: Insufficient documentation

## 2023-12-21 DIAGNOSIS — Z79899 Other long term (current) drug therapy: Secondary | ICD-10-CM | POA: Diagnosis not present

## 2023-12-21 DIAGNOSIS — F17210 Nicotine dependence, cigarettes, uncomplicated: Secondary | ICD-10-CM | POA: Insufficient documentation

## 2023-12-21 LAB — CBC
HCT: 37.2 % — ABNORMAL LOW (ref 39.0–52.0)
Hemoglobin: 12.3 g/dL — ABNORMAL LOW (ref 13.0–17.0)
MCH: 33.1 pg (ref 26.0–34.0)
MCHC: 33.1 g/dL (ref 30.0–36.0)
MCV: 100 fL (ref 80.0–100.0)
Platelets: 234 K/uL (ref 150–400)
RBC: 3.72 MIL/uL — ABNORMAL LOW (ref 4.22–5.81)
RDW: 15.3 % (ref 11.5–15.5)
WBC: 6.2 K/uL (ref 4.0–10.5)
nRBC: 0 % (ref 0.0–0.2)

## 2023-12-21 LAB — BASIC METABOLIC PANEL WITH GFR
Anion gap: 15 (ref 5–15)
BUN: 12 mg/dL (ref 8–23)
CO2: 19 mmol/L — ABNORMAL LOW (ref 22–32)
Calcium: 8.6 mg/dL — ABNORMAL LOW (ref 8.9–10.3)
Chloride: 108 mmol/L (ref 98–111)
Creatinine, Ser: 1.34 mg/dL — ABNORMAL HIGH (ref 0.61–1.24)
GFR, Estimated: 60 mL/min — ABNORMAL LOW (ref >60)
Glucose, Bld: 88 mg/dL (ref 70–99)
Potassium: 3.6 mmol/L (ref 3.5–5.1)
Sodium: 142 mmol/L (ref 135–145)

## 2023-12-21 LAB — HEPATIC FUNCTION PANEL
ALT: 30 U/L (ref 0–44)
AST: 46 U/L — ABNORMAL HIGH (ref 15–41)
Albumin: 3.2 g/dL — ABNORMAL LOW (ref 3.5–5.0)
Alkaline Phosphatase: 79 U/L (ref 38–126)
Bilirubin, Direct: 0.1 mg/dL (ref 0.0–0.2)
Indirect Bilirubin: 0.5 mg/dL (ref 0.3–0.9)
Total Bilirubin: 0.6 mg/dL (ref 0.0–1.2)
Total Protein: 6.7 g/dL (ref 6.5–8.1)

## 2023-12-21 LAB — TROPONIN I (HIGH SENSITIVITY)
Troponin I (High Sensitivity): 18 ng/L — ABNORMAL HIGH (ref <18)
Troponin I (High Sensitivity): 16 ng/L (ref <18)

## 2023-12-21 LAB — ETHANOL: Alcohol, Ethyl (B): 271 mg/dL — ABNORMAL HIGH (ref <15)

## 2023-12-21 LAB — LIPASE, BLOOD: Lipase: 42 U/L (ref 11–51)

## 2023-12-21 MED ORDER — ACETAMINOPHEN 500 MG PO TABS
1000.0000 mg | ORAL_TABLET | Freq: Once | ORAL | Status: AC
  Administered 2023-12-21: 1000 mg via ORAL
  Filled 2023-12-21: qty 2

## 2023-12-21 MED ORDER — PANTOPRAZOLE SODIUM 40 MG PO TBEC
40.0000 mg | DELAYED_RELEASE_TABLET | Freq: Once | ORAL | Status: AC
  Administered 2023-12-21: 40 mg via ORAL
  Filled 2023-12-21: qty 1

## 2023-12-21 MED ORDER — ALUM & MAG HYDROXIDE-SIMETH 200-200-20 MG/5ML PO SUSP
30.0000 mL | Freq: Once | ORAL | Status: AC
  Administered 2023-12-21: 30 mL via ORAL
  Filled 2023-12-21: qty 30

## 2023-12-21 NOTE — ED Notes (Signed)
 Food and drink provided to pt. Pt able to tolerate PO with no sign of pain, nausea or vomiting.

## 2023-12-21 NOTE — ED Notes (Signed)
 Pt given 2nd sandwich bag

## 2023-12-21 NOTE — ED Triage Notes (Signed)
 Pt arrive by EMS from home for c/o cp that started 15 min pta to ED. And HA on and off for the last few days. Pt had 324 mg ASA and 200 mL NS given by EMS pta to ED. Pt is AO x 4 on arrival no neuro deficit noticed.
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
## 2024-01-02 ENCOUNTER — Observation Stay (HOSPITAL_COMMUNITY)
Admission: EM | Admit: 2024-01-02 | Discharge: 2024-01-04 | Disposition: A | Discharge status: Home / Self Care | Attending: Internal Medicine | Admitting: Internal Medicine

## 2024-01-02 ENCOUNTER — Other Ambulatory Visit: Payer: Self-pay

## 2024-01-02 ENCOUNTER — Encounter (HOSPITAL_COMMUNITY): Payer: Self-pay

## 2024-01-02 ENCOUNTER — Emergency Department (HOSPITAL_COMMUNITY)

## 2024-01-02 DIAGNOSIS — R531 Weakness: Secondary | ICD-10-CM | POA: Diagnosis not present

## 2024-01-02 DIAGNOSIS — E872 Acidosis, unspecified: Secondary | ICD-10-CM | POA: Insufficient documentation

## 2024-01-02 DIAGNOSIS — F10129 Alcohol abuse with intoxication, unspecified: Secondary | ICD-10-CM | POA: Diagnosis not present

## 2024-01-02 DIAGNOSIS — E876 Hypokalemia: Secondary | ICD-10-CM | POA: Diagnosis not present

## 2024-01-02 DIAGNOSIS — Z79899 Other long term (current) drug therapy: Secondary | ICD-10-CM | POA: Insufficient documentation

## 2024-01-02 DIAGNOSIS — I10 Essential (primary) hypertension: Secondary | ICD-10-CM | POA: Diagnosis not present

## 2024-01-02 DIAGNOSIS — N179 Acute kidney failure, unspecified: Principal | ICD-10-CM | POA: Diagnosis present

## 2024-01-02 DIAGNOSIS — W19XXXA Unspecified fall, initial encounter: Secondary | ICD-10-CM | POA: Diagnosis not present

## 2024-01-02 LAB — URINALYSIS, ROUTINE W REFLEX MICROSCOPIC
Bacteria, UA: NONE SEEN
Bilirubin Urine: NEGATIVE
Glucose, UA: NEGATIVE mg/dL
Hgb urine dipstick: NEGATIVE
Ketones, ur: NEGATIVE mg/dL
Leukocytes,Ua: NEGATIVE
Nitrite: NEGATIVE
Protein, ur: NEGATIVE mg/dL
Specific Gravity, Urine: 1.006 (ref 1.005–1.030)
pH: 5 (ref 5.0–8.0)

## 2024-01-02 LAB — I-STAT VENOUS BLOOD GAS, ED
Acid-base deficit: 2 mmol/L (ref 0.0–2.0)
Bicarbonate: 22.2 mmol/L (ref 20.0–28.0)
Calcium, Ion: 1.11 mmol/L — ABNORMAL LOW (ref 1.15–1.40)
HCT: 36 % — ABNORMAL LOW (ref 39.0–52.0)
Hemoglobin: 12.2 g/dL — ABNORMAL LOW (ref 13.0–17.0)
O2 Saturation: 71 %
Potassium: 4.1 mmol/L (ref 3.5–5.1)
Sodium: 134 mmol/L — ABNORMAL LOW (ref 135–145)
TCO2: 23 mmol/L (ref 22–32)
pCO2, Ven: 36.2 mmHg — ABNORMAL LOW (ref 44–60)
pH, Ven: 7.396 (ref 7.25–7.43)
pO2, Ven: 37 mmHg (ref 32–45)

## 2024-01-02 LAB — CBC
HCT: 37.8 % — ABNORMAL LOW (ref 39.0–52.0)
Hemoglobin: 12.4 g/dL — ABNORMAL LOW (ref 13.0–17.0)
MCH: 33.2 pg (ref 26.0–34.0)
MCHC: 32.8 g/dL (ref 30.0–36.0)
MCV: 101.1 fL — ABNORMAL HIGH (ref 80.0–100.0)
Platelets: 233 K/uL (ref 150–400)
RBC: 3.74 MIL/uL — ABNORMAL LOW (ref 4.22–5.81)
RDW: 15.3 % (ref 11.5–15.5)
WBC: 5.4 K/uL (ref 4.0–10.5)
nRBC: 0 % (ref 0.0–0.2)

## 2024-01-02 LAB — BASIC METABOLIC PANEL WITH GFR
Anion gap: 16 — ABNORMAL HIGH (ref 5–15)
BUN: 11 mg/dL (ref 8–23)
CO2: 20 mmol/L — ABNORMAL LOW (ref 22–32)
Calcium: 8.7 mg/dL — ABNORMAL LOW (ref 8.9–10.3)
Chloride: 99 mmol/L (ref 98–111)
Creatinine, Ser: 2.24 mg/dL — ABNORMAL HIGH (ref 0.61–1.24)
GFR, Estimated: 32 mL/min — ABNORMAL LOW (ref >60)
Glucose, Bld: 118 mg/dL — ABNORMAL HIGH (ref 70–99)
Potassium: 4.2 mmol/L (ref 3.5–5.1)
Sodium: 135 mmol/L (ref 135–145)

## 2024-01-02 LAB — ETHANOL: Alcohol, Ethyl (B): 245 mg/dL — ABNORMAL HIGH (ref <15)

## 2024-01-02 MED ORDER — PROCHLORPERAZINE EDISYLATE 10 MG/2ML IJ SOLN
5.0000 mg | Freq: Four times a day (QID) | INTRAMUSCULAR | Status: DC | PRN
  Administered 2024-01-04: 5 mg via INTRAVENOUS
  Filled 2024-01-02: qty 2

## 2024-01-02 MED ORDER — HEPARIN SODIUM (PORCINE) 5000 UNIT/ML IJ SOLN
5000.0000 [IU] | Freq: Three times a day (TID) | INTRAMUSCULAR | Status: DC
  Administered 2024-01-02 – 2024-01-04 (×5): 5000 [IU] via SUBCUTANEOUS
  Filled 2024-01-02 (×5): qty 1

## 2024-01-02 MED ORDER — LORAZEPAM 2 MG/ML IJ SOLN: 0.0000 mg | Freq: Four times a day (QID) | INTRAMUSCULAR | Status: DC

## 2024-01-02 MED ORDER — LORAZEPAM 1 MG PO TABS: 0.0000 mg | ORAL_TABLET | Freq: Two times a day (BID) | ORAL | Status: DC

## 2024-01-02 MED ORDER — LORAZEPAM 2 MG/ML IJ SOLN: 0.0000 mg | Freq: Two times a day (BID) | INTRAMUSCULAR | Status: DC

## 2024-01-02 MED ORDER — LORAZEPAM 1 MG PO TABS: 0.0000 mg | ORAL_TABLET | Freq: Four times a day (QID) | ORAL | Status: DC

## 2024-01-02 MED ORDER — THIAMINE HCL 100 MG/ML IJ SOLN: 100.0000 mg | Freq: Every day | INTRAMUSCULAR | Status: DC

## 2024-01-02 MED ORDER — ACETAMINOPHEN 325 MG PO TABS: 650.0000 mg | ORAL_TABLET | Freq: Four times a day (QID) | ORAL | Status: DC | PRN

## 2024-01-02 MED ORDER — ACETAMINOPHEN 500 MG PO TABS
500.0000 mg | ORAL_TABLET | Freq: Four times a day (QID) | ORAL | Status: DC | PRN
  Administered 2024-01-02 – 2024-01-03 (×2): 500 mg via ORAL
  Filled 2024-01-02 (×2): qty 1

## 2024-01-02 MED ORDER — LACTATED RINGERS IV BOLUS
1000.0000 mL | Freq: Once | INTRAVENOUS | Status: AC
  Administered 2024-01-02: 1000 mL via INTRAVENOUS

## 2024-01-02 MED ORDER — LACTATED RINGERS IV SOLN: INTRAVENOUS | Status: AC

## 2024-01-02 MED ORDER — POLYETHYLENE GLYCOL 3350 17 G PO PACK: 17.0000 g | PACK | Freq: Every day | ORAL | Status: DC | PRN

## 2024-01-02 MED ORDER — MELATONIN 5 MG PO TABS: 5.0000 mg | ORAL_TABLET | Freq: Every evening | ORAL | Status: DC | PRN

## 2024-01-02 MED ORDER — GUAIFENESIN-DM 100-10 MG/5ML PO SYRP
5.0000 mL | ORAL_SOLUTION | ORAL | Status: DC | PRN
  Administered 2024-01-02 – 2024-01-04 (×5): 5 mL via ORAL
  Filled 2024-01-02: qty 5
  Filled 2024-01-02 (×3): qty 10
  Filled 2024-01-02: qty 5

## 2024-01-02 MED ORDER — ONDANSETRON HCL 4 MG PO TABS: 4.0000 mg | ORAL_TABLET | Freq: Three times a day (TID) | ORAL | Status: DC | PRN

## 2024-01-02 MED ORDER — THIAMINE MONONITRATE 100 MG PO TABS
100.0000 mg | ORAL_TABLET | Freq: Every day | ORAL | Status: DC
  Administered 2024-01-03 – 2024-01-04 (×2): 100 mg via ORAL
  Filled 2024-01-02 (×2): qty 1
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
## 2024-01-02 NOTE — ED Provider Notes (Signed)
 [place] [facility] Provider Note   CSN: [id number] Arrival date & time: 01/02/24  1956     Patient presents with: Devin [name] is a 63 y.o. male.   Is a 63 year old male presenting emergency department from a restaurant after a fall.  Reports he was unable to get up after he fell.  Did not pass out.  Not having any pain from the fall.  Reports his legs felt heavy.  No headache no vision changes.  Endorses alcohol use earlier today.   Fall       Prior to Admission medications   Medication Sig Start Date End Date Taking? Authorizing Provider  allopurinol  (ZYLOPRIM ) 300 MG tablet Take 300 mg by mouth daily.    [provider]  lisinopril  (ZESTRIL ) 20 MG tablet Take 20 mg by mouth daily.    [provider]  metoprolol  succinate (TOPROL -XL) 50 MG 24 hr tablet Take 3 tablets (150 mg total) by mouth daily. 09/29/18   Lawyer, Lonni, PA-C  ondansetron  (ZOFRAN ) 4 MG tablet Take 1 tablet (4 mg total) by mouth every 6 (six) hours. 09/05/23   Kingsley, Victoria K, DO  pantoprazole  (PROTONIX ) 40 MG tablet Take 1 tablet (40 mg total) by mouth daily. 09/05/23   Kingsley, Victoria K, DO    Allergies: Patient has no known allergies.    Review of Systems  Updated Vital Signs BP 112/80   Pulse 98   Temp 98.2 F (36.8 C) (Oral)   Resp 17   Ht 5' 9 (1.753 m)   Wt 71 kg   SpO2 99%   BMI 23.11 kg/m   Physical Exam Vitals and nursing note reviewed.  Constitutional:      General: He is not in acute distress.    Appearance: He is not toxic-appearing.  HENT:     Head: Normocephalic and atraumatic.     Nose: Nose normal.     Mouth/Throat:     Mouth: Mucous membranes are moist.  Eyes:     Conjunctiva/sclera: Conjunctivae normal.  Cardiovascular:     Rate and Rhythm: Normal rate and regular rhythm.  Pulmonary:     Effort: Pulmonary effort is normal.     Breath sounds: Normal breath sounds.  Abdominal:      General: Abdomen is flat. There is no distension.     Palpations: Abdomen is soft.     Tenderness: There is no abdominal tenderness. There is no guarding or rebound.  Musculoskeletal:        General: Normal range of motion.  Skin:    General: Skin is warm and dry.     Capillary Refill: Capillary refill takes less than 2 seconds.  Neurological:     Mental Status: He is alert.     Comments: No motor deficits.  Normal sensation.  Coordinated movements  Psychiatric:     Comments: Intoxicated     (all labs ordered are listed, but only abnormal results are displayed) Labs Reviewed  CBC - Abnormal; Notable for the following components:      Result Value   RBC 3.74 (*)    Hemoglobin 12.4 (*)    HCT 37.8 (*)    MCV 101.1 (*)    All other components within normal limits  BASIC METABOLIC PANEL WITH GFR - Abnormal; Notable for the following components:   CO2 20 (*)    Glucose, Bld 118 (*)    Creatinine, Ser 2.24 (*)  Calcium  8.7 (*)    GFR, Estimated 32 (*)    Anion gap 16 (*)    All other components within normal limits  ETHANOL - Abnormal; Notable for the following components:   Alcohol, Ethyl (B) 245 (*)    All other components within normal limits  URINALYSIS, ROUTINE W REFLEX MICROSCOPIC - Abnormal; Notable for the following components:   APPearance HAZY (*)    All other components within normal limits  I-STAT VENOUS BLOOD GAS, ED - Abnormal; Notable for the following components:   pCO2, Ven 36.2 (*)    Sodium 134 (*)    Calcium , Ion 1.11 (*)    HCT 36.0 (*)    Hemoglobin 12.2 (*)    All other components within normal limits  HIV ANTIBODY (ROUTINE TESTING W REFLEX)    EKG: EKG Interpretation Date/Time:  Wednesday January 02 2024 20:07:32 EDT Ventricular Rate:  105 PR Interval:  164 QRS Duration:  77 QT Interval:  327 QTC Calculation: 433 R Axis:   47  Text Interpretation: Sinus tachycardia Probable left atrial enlargement Abnormal R-wave progression, early  transition Abnormal T, consider ischemia, diffuse leads Confirmed by Neysa Clap [phone]) on 01/02/2024 8:27:31 PM  Radiology: CT Head Wo Contrast Result Date: 01/02/2024 CLINICAL DATA:  Neck trauma, intoxicated or obtunded (Age >= 16y); Head trauma, abnormal mental status (Age 11-64y) EXAM: CT HEAD WITHOUT CONTRAST CT CERVICAL SPINE WITHOUT CONTRAST TECHNIQUE: Multidetector CT imaging of the head and cervical spine was performed following the standard protocol without intravenous contrast. Multiplanar CT image reconstructions of the cervical spine were also generated. RADIATION DOSE REDUCTION: This exam was performed according to the departmental dose-optimization program which includes automated exposure control, adjustment of the mA and/or kV according to patient size and/or use of iterative reconstruction technique. COMPARISON:  08/13/2010 FINDINGS: CT HEAD FINDINGS Brain: Normal anatomic configuration. Parenchymal volume loss is commensurate with the patient's age. Mild periventricular white matter changes are present likely reflecting the sequela of small vessel ischemia. No abnormal intra or extra-axial mass lesion or fluid collection. No abnormal mass effect or midline shift. No evidence of acute intracranial hemorrhage or infarct. Ventricular size is normal. Cerebellum unremarkable. Vascular: No asymmetric hyperdense vasculature at the skull base. Skull: Intact Sinuses/Orbits: Paranasal sinuses are clear. Orbits are unremarkable. Other: Mastoid air cells and middle ear cavities are clear. Small left parietal scalp hematoma CT CERVICAL SPINE FINDINGS Alignment: 3 mm anterolisthesis C7-T1, likely degenerative in nature. Mild cervical kyphosis. Skull base and vertebrae: Craniocervical alignment is normal. The atlantodental interval is not widened. No acute fracture of the cervical spine. Ankylosis of the left facet joints of C2-C4 as well as the vertebral bodies and right facet joints of C2-C3. Soft tissues  and spinal canal: No prevertebral fluid or swelling. No visible canal hematoma. Disc levels: Disc space narrowing endplate remodeling and vacuum disc phenomena is seen throughout the cervical spine in keeping with changes of advanced degenerative disc disease, most severe at C4-C6. Prevertebral soft tissues are not thickened on sagittal reformats. No high-grade canal stenosis. Multilevel uncovertebral and facet arthrosis results in multilevel moderate to severe neuroforaminal narrowing, most severe on the right at C4-5 C6-7 and C7-T1 and on the left at C3-4, C4-5, and C7-T1. Upper chest: Negative. Other: None IMPRESSION: 1. No acute intracranial abnormality. No calvarial fracture. Small left parietal scalp hematoma. 2. No acute fracture or listhesis of the cervical spine. 3. 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 Electronically Signed   By: Dorethia Molt M.D.   On: 01/02/2024 21:37   CT Cervical Spine Wo Contrast Result Date: 01/02/2024 CLINICAL DATA:  Neck trauma, intoxicated or obtunded (Age >= 16y); Head trauma, abnormal mental status (Age 37-64y) EXAM: CT HEAD WITHOUT CONTRAST CT CERVICAL SPINE WITHOUT CONTRAST TECHNIQUE: Multidetector CT imaging of the head and cervical spine was performed following the standard protocol without intravenous contrast. Multiplanar CT image reconstructions of the cervical spine were also generated. RADIATION DOSE REDUCTION: This exam was performed according to the departmental dose-optimization program which includes automated exposure control, adjustment of the mA and/or kV according to patient size and/or use of iterative reconstruction technique. COMPARISON:  08/13/2010 FINDINGS: CT HEAD FINDINGS Brain: Normal anatomic configuration. Parenchymal volume loss is commensurate with the patient's age. Mild periventricular white matter changes are present likely  reflecting the sequela of small vessel ischemia. No abnormal intra or extra-axial mass lesion or fluid collection. No abnormal mass effect or midline shift. No evidence of acute intracranial hemorrhage or infarct. Ventricular size is normal. Cerebellum unremarkable. Vascular: No asymmetric hyperdense vasculature at the skull base. Skull: Intact Sinuses/Orbits: Paranasal sinuses are clear. Orbits are unremarkable. Other: Mastoid air cells and middle ear cavities are clear. Small left parietal scalp hematoma CT CERVICAL SPINE FINDINGS Alignment: 3 mm anterolisthesis C7-T1, likely degenerative in nature. Mild cervical kyphosis. Skull base and vertebrae: Craniocervical alignment is normal. The atlantodental interval is not widened. No acute fracture of the cervical spine. Ankylosis of the left facet joints of C2-C4 as well as the vertebral bodies and right facet joints of C2-C3. Soft tissues and spinal canal: No prevertebral fluid or swelling. No visible canal hematoma. Disc levels: Disc space narrowing endplate remodeling and vacuum disc phenomena is seen throughout the cervical spine in keeping with changes of advanced degenerative disc disease, most severe at C4-C6. Prevertebral soft tissues are not thickened on sagittal reformats. No high-grade canal stenosis. Multilevel uncovertebral and facet arthrosis results in multilevel moderate to severe neuroforaminal narrowing, most severe on the right at C4-5 C6-7 and C7-T1 and on the left at C3-4, C4-5, and C7-T1. Upper chest: Negative. Other: None IMPRESSION: 1. No acute intracranial abnormality. No calvarial fracture. Small left parietal scalp hematoma. 2. No acute fracture or listhesis of the cervical spine. 3. 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 Electronically Signed   By: Dorethia Molt M.D.   On: 01/02/2024 21:37   DG Chest Portable 1  View Result Date: 01/02/2024 CLINICAL DATA:  Fall. EXAM: PORTABLE CHEST 1 VIEW COMPARISON:  Chest x-ray 12/21/2018. FINDINGS: The heart size and mediastinal contours are within normal limits. Both lungs are clear. There is a healed left sixth rib fracture. No acute fractures are seen. IMPRESSION: No active disease. Electronically Signed   By: Greig Pique M.D.   On: 01/02/2024 20:53     Procedures   Medications Ordered in the ED  LORazepam  (ATIVAN ) injection 0-4 mg ( Intravenous See Alternative 01/02/24 2328)    Or  LORazepam  (ATIVAN ) tablet 0-4 mg ( Oral Not Given 01/02/24 2328)  LORazepam  (ATIVAN ) injection 0-4 mg (has no administration in time range)    Or  LORazepam  (ATIVAN ) tablet 0-4 mg (has no administration in time range)  thiamine  (VITAMIN B1) tablet 100 mg (has no administration in time range)    Or  thiamine  (VITAMIN B1) injection 100 mg (has no administration in time range)  ondansetron  (  ZOFRAN ) tablet 4 mg (has no administration in time range)  heparin  injection 5,000 Units (has no administration in time range)  lactated ringers  infusion (has no administration in time range)  prochlorperazine  (COMPAZINE ) injection 5 mg (has no administration in time range)  polyethylene glycol (MIRALAX  / GLYCOLAX ) packet 17 g (has no administration in time range)  melatonin tablet 5 mg (has no administration in time range)  acetaminophen  (TYLENOL ) tablet 500 mg (has no administration in time range)  guaiFENesin -dextromethorphan  (ROBITUSSIN DM) 100-10 MG/5ML syrup 5 mL (has no administration in time range)  lactated ringers  bolus 1,000 mL (0 mLs Intravenous Stopped 01/02/24 2301)  lactated ringers  bolus 1,000 mL (0 mLs Intravenous Stopped 01/02/24 2301)    Clinical Course as of 01/02/24 2343  Wed Jan 02, 2024  2145 CT Head Wo Contrast IMPRESSION: 1. No acute intracranial abnormality. No calvarial fracture. Small left parietal scalp hematoma. 2. No acute fracture or listhesis of the cervical  spine. 3. 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   Electronically Signed   By: Dorethia Molt M.D.   On: 01/02/2024 21:37   [TY]  2145 CT Cervical Spine Wo Contrast IMPRESSION: 1. No acute intracranial abnormality. No calvarial fracture. Small left parietal scalp hematoma. 2. No acute fracture or listhesis of the cervical spine. 3. 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   Electronically Signed   By: Dorethia Molt M.D.   On: 01/02/2024 21:37     [TY]    Clinical Course User Index [TY] Neysa Caron PARAS, DO                                 Medical Decision Making This is a 62 year old male with past medical history includes hypertension hyperlipidemia homelessness, alcohol abuse, substance abuse presenting emergency department after a fall.  He is afebrile nontachycardic, hemodynamically stable.  Per chart review not on blood thinners.  EMS reported hypotension and route with a systolic blood pressure in the 70s.  Received a liter of IV fluids.  Was given further IV fluids here.  Was clinically intoxicated, but no neurodeficits.  CT head negative for acute traumatic injury.  CT cervical also negative.  Chest x-ray without pneumonia or pneumothorax.  Labs concerning for an AKI with a elevated creatinine of 2.24.  Has an elevated alcohol level is 245.  Comprehensive panel with mildly low bicarb and anion gap.  Follow-up VBG with normal pH.  Urine without ketones as well.  Secondary to dehydration?  Given patient's numerous comorbidities and polysubstance abuse and poor access to health concern for adequate follow-up given his AKI.  Will admit for IV fluids and further monitoring.  Care discussed with hospitalist who agrees to see the patient.  Amount  and/or Complexity of Data Reviewed Labs: ordered. Radiology: ordered. Decision-making details documented in ED Course. ECG/medicine tests: ordered.  Risk OTC drugs. Prescription drug management. Decision regarding hospitalization.       Final diagnoses:  AKI (acute kidney injury) [facility])    ED Discharge Orders     None          Neysa Caron PARAS, DO 01/02/24 2343

## 2024-01-02 NOTE — ED Triage Notes (Signed)
 Patient BIB [facility] from restaurant due to a fall. Trying to get up and couldn't get up. Patient states he did not pass out. Initial pressure 70/52, ST 120. BP now 90/50 and HR 102. Denies hitting head or injury from fall. 1L LR given en route.

## 2024-01-03 DIAGNOSIS — F1010 Alcohol abuse, uncomplicated: Secondary | ICD-10-CM | POA: Diagnosis not present

## 2024-01-03 DIAGNOSIS — N179 Acute kidney failure, unspecified: Secondary | ICD-10-CM | POA: Diagnosis not present

## 2024-01-03 DIAGNOSIS — E876 Hypokalemia: Secondary | ICD-10-CM | POA: Diagnosis not present

## 2024-01-03 LAB — CBC
HCT: 33.6 % — ABNORMAL LOW (ref 39.0–52.0)
Hemoglobin: 11.3 g/dL — ABNORMAL LOW (ref 13.0–17.0)
MCH: 33.7 pg (ref 26.0–34.0)
MCHC: 33.6 g/dL (ref 30.0–36.0)
MCV: 100.3 fL — ABNORMAL HIGH (ref 80.0–100.0)
Platelets: 207 K/uL (ref 150–400)
RBC: 3.35 MIL/uL — ABNORMAL LOW (ref 4.22–5.81)
RDW: 15.4 % (ref 11.5–15.5)
WBC: 5.2 K/uL (ref 4.0–10.5)
nRBC: 0 % (ref 0.0–0.2)

## 2024-01-03 LAB — HIV ANTIBODY (ROUTINE TESTING W REFLEX): HIV Screen 4th Generation wRfx: NONREACTIVE

## 2024-01-03 LAB — BASIC METABOLIC PANEL WITH GFR
Anion gap: 14 (ref 5–15)
BUN: 11 mg/dL (ref 8–23)
CO2: 21 mmol/L — ABNORMAL LOW (ref 22–32)
Calcium: 8.7 mg/dL — ABNORMAL LOW (ref 8.9–10.3)
Chloride: 101 mmol/L (ref 98–111)
Creatinine, Ser: 1.45 mg/dL — ABNORMAL HIGH (ref 0.61–1.24)
GFR, Estimated: 54 mL/min — ABNORMAL LOW (ref >60)
Glucose, Bld: 115 mg/dL — ABNORMAL HIGH (ref 70–99)
Potassium: 3.4 mmol/L — ABNORMAL LOW (ref 3.5–5.1)
Sodium: 136 mmol/L (ref 135–145)

## 2024-01-03 LAB — PHOSPHORUS: Phosphorus: 5.2 mg/dL — ABNORMAL HIGH (ref 2.5–4.6)

## 2024-01-03 LAB — MAGNESIUM: Magnesium: 1.4 mg/dL — ABNORMAL LOW (ref 1.7–2.4)

## 2024-01-03 MED ORDER — METOPROLOL TARTRATE 25 MG PO TABS
25.0000 mg | ORAL_TABLET | Freq: Two times a day (BID) | ORAL | Status: DC
  Administered 2024-01-03 – 2024-01-04 (×2): 25 mg via ORAL
  Filled 2024-01-03 (×2): qty 1

## 2024-01-03 MED ORDER — POTASSIUM CHLORIDE 20 MEQ PO PACK
40.0000 meq | PACK | Freq: Every day | ORAL | Status: DC
  Administered 2024-01-03 – 2024-01-04 (×2): 40 meq via ORAL
  Filled 2024-01-03 (×2): qty 2

## 2024-01-03 MED ORDER — HYDRALAZINE HCL 20 MG/ML IJ SOLN: 10.0000 mg | Freq: Four times a day (QID) | INTRAMUSCULAR | Status: DC | PRN

## 2024-01-03 MED ORDER — LISINOPRIL 10 MG PO TABS
10.0000 mg | ORAL_TABLET | Freq: Every day | ORAL | Status: DC
  Administered 2024-01-03 – 2024-01-04 (×2): 10 mg via ORAL
  Filled 2024-01-03 (×2): qty 1

## 2024-01-03 MED ORDER — MAGNESIUM OXIDE -MG SUPPLEMENT 400 (240 MG) MG PO TABS
400.0000 mg | ORAL_TABLET | Freq: Every day | ORAL | Status: DC
  Administered 2024-01-03 – 2024-01-04 (×2): 400 mg via ORAL
  Filled 2024-01-03 (×2): qty 1

## 2024-01-03 NOTE — Progress Notes (Signed)
 Patient who was received from outgoing staff alert and oriented on room air. Patient had presented with concerns of a fall after stating that his legs just gave in and patient could not he able to stand hence was assisted to the floor by friends and called EMS. At the ED patient was noted to he hypotensive, he was tachycardic and had tachypnea. Patient being managed for AKI and is on alcohol withdrawal protocol. Patient care to continue per plan and recommendations.

## 2024-01-03 NOTE — Progress Notes (Signed)
 Progress Note   Patient: Devin [name] FMW:[id number] DOB: Aug 24, 1960 DOA: 01/02/2024     0 DOS: the patient was seen and examined on 01/03/2024   Brief hospital course: Devin [name] is a 63 y.o. male with medical history significant for alcohol abuse, erectile dysfunction, avascular necrosis, who presents to the ER after a fall at a restaurant.  States his legs gave out and he could not get back up.  Denies losing consciousness.  Denies any cardio pulmonary or GI symptoms.  Upon EMS arrival, the patient was hypotensive and tachycardic.  He received 1 L IV fluid bolus LR en route via EMS.     In the ER, tachycardic and tachypneic with an elevated alcohol level 245.  Lab work noticeable for elevated creatinine 2.24 from baseline of 1.3.  EDP requesting admission for further management of AKI.  Admitted by [facility], hospitalist service.  Assessment and Plan: AKI in the setting of alcohol abuse and intoxication Baseline creatinine appears to be 1.3 GFR of > 60 Presented with creatinine of 2.24 with GFR 32. Creatinine improved to 1.48. Avoid nephrotoxic agents. Continue IV fluid hydration LR at 100 cc/h x 1 day Repeat BMP in the morning   High anion gap metabolic acidosis Anion gap 16, serum bicarb 20, improved. Continue IV fluid hydration.  Hypokalemia- Oral kcl supplements ordered.  Hypomagnesemia- Oral mag ox ordered.   Alcohol abuse and intoxication Alcohol level on presentation 245 on 01/02/2024. CIWA protocol with ativan  TOC consulted to provide resources for alcohol cessation   History of polysubstance abuse including cocaine, THC   Generalized weakness with fall Likely due to alcohol abuse, dehydration, and possibly electrolytes derangement Continue to treat underlying conditions PT OT evaluated- walking independently. Fall precautions     Out of bed to chair. Incentive spirometry. Nursing supportive care. Fall, aspiration precautions. Diet:  Diet Orders (From  admission, onward)     Start     Ordered   01/02/24 2337  Diet Heart Room service appropriate? Yes; Fluid consistency: Thin  Diet effective now       Question Answer Comment  Room service appropriate? Yes   Fluid consistency: Thin      01/02/24 2336           DVT prophylaxis: heparin  injection 5,000 Units Start: 01/02/24 2345  Level of care: Telemetry Medical   Code Status: Full Code  Subjective: Patient is seen and examined today morning. He feels weak. Eating poor. No hand tremors. Walked with PT.  Physical Exam: Vitals:   01/03/24 0700 01/03/24 0908 01/03/24 1200 01/03/24 1311  BP: 136/86 (!) 150/113 (!) 149/106 (!) 156/99  Pulse: 87 90 80 85  Resp: (!) 21 16 14 16   Temp:  98.7 F (37.1 C) 98.9 F (37.2 C) 98 F (36.7 C)  TempSrc:  Oral Oral Oral  SpO2: 100% 100% 100% 100%  Weight:      Height:        General - Elderly African American ill male, no apparent distress HEENT - PERRLA, EOMI, atraumatic head, non tender sinuses. Lung - Clear, diffuse rales, no rhonchi, wheezes. Heart - S1, S2 heard, no murmurs, rubs, no pedal edema. Abdomen - Soft, non tender, bowel sounds good Neuro - Alert, awake and oriented x 3, non focal exam. Skin - Warm and dry.  Data Reviewed:      Latest Ref Rng & Units 01/03/2024    3:30 AM 01/02/2024   10:59 PM 01/02/2024    8:12  PM  CBC  WBC 4.0 - 10.5 K/uL 5.2   5.4   Hemoglobin 13.0 - 17.0 g/dL 88.6  87.7  87.5   Hematocrit 39.0 - 52.0 % 33.6  36.0  37.8   Platelets 150 - 400 K/uL 207   233       Latest Ref Rng & Units 01/03/2024    3:30 AM 01/02/2024   10:59 PM 01/02/2024    8:12 PM  BMP  Glucose 70 - 99 mg/dL 884   881   BUN 8 - 23 mg/dL 11   11   Creatinine 9.38 - 1.24 mg/dL 8.54   7.75   Sodium 864 - 145 mmol/L 136  134  135   Potassium 3.5 - 5.1 mmol/L 3.4  4.1  4.2   Chloride 98 - 111 mmol/L 101   99   CO2 22 - 32 mmol/L 21   20   Calcium  8.9 - 10.3 mg/dL 8.7   8.7    CT Head Wo Contrast Result Date: 01/02/2024 CLINICAL  DATA:  Neck trauma, intoxicated or obtunded (Age >= 16y); Head trauma, abnormal mental status (Age 36-64y) EXAM: CT HEAD WITHOUT CONTRAST CT CERVICAL SPINE WITHOUT CONTRAST TECHNIQUE: Multidetector CT imaging of the head and cervical spine was performed following the standard protocol without intravenous contrast. Multiplanar CT image reconstructions of the cervical spine were also generated. RADIATION DOSE REDUCTION: This exam was performed according to the departmental dose-optimization program which includes automated exposure control, adjustment of the mA and/or kV according to patient size and/or use of iterative reconstruction technique. COMPARISON:  08/13/2010 FINDINGS: CT HEAD FINDINGS Brain: Normal anatomic configuration. Parenchymal volume loss is commensurate with the patient's age. Mild periventricular white matter changes are present likely reflecting the sequela of small vessel ischemia. No abnormal intra or extra-axial mass lesion or fluid collection. No abnormal mass effect or midline shift. No evidence of acute intracranial hemorrhage or infarct. Ventricular size is normal. Cerebellum unremarkable. Vascular: No asymmetric hyperdense vasculature at the skull base. Skull: Intact Sinuses/Orbits: Paranasal sinuses are clear. Orbits are unremarkable. Other: Mastoid air cells and middle ear cavities are clear. Small left parietal scalp hematoma CT CERVICAL SPINE FINDINGS Alignment: 3 mm anterolisthesis C7-T1, likely degenerative in nature. Mild cervical kyphosis. Skull base and vertebrae: Craniocervical alignment is normal. The atlantodental interval is not widened. No acute fracture of the cervical spine. Ankylosis of the left facet joints of C2-C4 as well as the vertebral bodies and right facet joints of C2-C3. Soft tissues and spinal canal: No prevertebral fluid or swelling. No visible canal hematoma. Disc levels: Disc space narrowing endplate remodeling and vacuum disc phenomena is seen throughout the  cervical spine in keeping with changes of advanced degenerative disc disease, most severe at C4-C6. Prevertebral soft tissues are not thickened on sagittal reformats. No high-grade canal stenosis. Multilevel uncovertebral and facet arthrosis results in multilevel moderate to severe neuroforaminal narrowing, most severe on the right at C4-5 C6-7 and C7-T1 and on the left at C3-4, C4-5, and C7-T1. Upper chest: Negative. Other: None IMPRESSION: 1. No acute intracranial abnormality. No calvarial fracture. Small left parietal scalp hematoma. 2. No acute fracture or listhesis of the cervical spine. 3. 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 Electronically Signed   By: Dorethia Molt M.D.   On: 01/02/2024 21:37   CT Cervical Spine Wo Contrast Result Date: 01/02/2024 CLINICAL DATA:  Neck trauma, intoxicated or obtunded (Age >=  16y); Head trauma, abnormal mental status (Age 74-64y) EXAM: CT HEAD WITHOUT CONTRAST CT CERVICAL SPINE WITHOUT CONTRAST TECHNIQUE: Multidetector CT imaging of the head and cervical spine was performed following the standard protocol without intravenous contrast. Multiplanar CT image reconstructions of the cervical spine were also generated. RADIATION DOSE REDUCTION: This exam was performed according to the departmental dose-optimization program which includes automated exposure control, adjustment of the mA and/or kV according to patient size and/or use of iterative reconstruction technique. COMPARISON:  08/13/2010 FINDINGS: CT HEAD FINDINGS Brain: Normal anatomic configuration. Parenchymal volume loss is commensurate with the patient's age. Mild periventricular white matter changes are present likely reflecting the sequela of small vessel ischemia. No abnormal intra or extra-axial mass lesion or fluid collection. No abnormal mass effect or midline shift. No evidence of acute  intracranial hemorrhage or infarct. Ventricular size is normal. Cerebellum unremarkable. Vascular: No asymmetric hyperdense vasculature at the skull base. Skull: Intact Sinuses/Orbits: Paranasal sinuses are clear. Orbits are unremarkable. Other: Mastoid air cells and middle ear cavities are clear. Small left parietal scalp hematoma CT CERVICAL SPINE FINDINGS Alignment: 3 mm anterolisthesis C7-T1, likely degenerative in nature. Mild cervical kyphosis. Skull base and vertebrae: Craniocervical alignment is normal. The atlantodental interval is not widened. No acute fracture of the cervical spine. Ankylosis of the left facet joints of C2-C4 as well as the vertebral bodies and right facet joints of C2-C3. Soft tissues and spinal canal: No prevertebral fluid or swelling. No visible canal hematoma. Disc levels: Disc space narrowing endplate remodeling and vacuum disc phenomena is seen throughout the cervical spine in keeping with changes of advanced degenerative disc disease, most severe at C4-C6. Prevertebral soft tissues are not thickened on sagittal reformats. No high-grade canal stenosis. Multilevel uncovertebral and facet arthrosis results in multilevel moderate to severe neuroforaminal narrowing, most severe on the right at C4-5 C6-7 and C7-T1 and on the left at C3-4, C4-5, and C7-T1. Upper chest: Negative. Other: None IMPRESSION: 1. No acute intracranial abnormality. No calvarial fracture. Small left parietal scalp hematoma. 2. No acute fracture or listhesis of the cervical spine. 3. 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 Electronically Signed   By: Dorethia Molt M.D.   On: 01/02/2024 21:37   DG Chest Portable 1 View Result Date: 01/02/2024 CLINICAL DATA:  Fall. EXAM: PORTABLE CHEST 1 VIEW COMPARISON:  Chest x-ray 12/21/2018. FINDINGS: The heart size and mediastinal contours are within normal  limits. Both lungs are clear. There is a healed left sixth rib fracture. No acute fractures are seen. IMPRESSION: No active disease. Electronically Signed   By: Greig Pique M.D.   On: 01/02/2024 20:53    Family Communication: Discussed with patient, understand and agree. All questions answered.  Disposition: Status is: Inpatient Remains inpatient appropriate because: IV fluids, electrolyte management, monitor for withdrawal.  Planned Discharge Destination: Home     Time spent: 44 minutes  Author: Concepcion Riser, MD 01/03/2024 4:20 PM Secure chat 7am to 7pm For on call review [email or web address].

## 2024-01-03 NOTE — ED Notes (Signed)
 Added to CCMD

## 2024-01-03 NOTE — ED Notes (Signed)
 Admitting  at bedside.

## 2024-01-03 NOTE — Evaluation (Signed)
 Occupational Therapy Evaluation Patient Details Name: Devin [name] MRN: [id number] DOB: [birth year] Today's Date: 01/03/2024   History of Present Illness   Devin [name] is a 63 y.o. male presenting to [facility] on 9/3 after a fall in a restaurant when he was unable to get up off the floor. CT findings of advanced multilevel degenerative disc and degenerative joint disease with moderate to severe neuro-foraminal narrowing per MD note. Pmh: HTN, hyperlipidemia, ETOH abuse, substance abuse.     Clinical Impressions Devin [name] was evaluated s/p the above admission list. He is indep and lives with a friends at baseline. Upon evaluation, pt demonstrated mod I-indep ability to complete mobility and ADLs. Pt noted some dizziness throughout, BP elevated. Pt does not require further acute, or follow up OT services. Recommend discharge back to pt's environment with assist as needed. OT to sign off with appreciation of order, please re-consult if needed.   Supine 157/106 Sitting 166/108 Standing 166/111 Standing after 5 minutes of ADLs 169/113      If plan is discharge home, recommend the following:   Assist for transportation     Functional Status Assessment   Patient has had a recent decline in their functional status and demonstrates the ability to make significant improvements in function in a reasonable and predictable amount of time.     Equipment Recommendations   None recommended by OT      Precautions/Restrictions   Precautions Precautions: Fall Recall of Precautions/Restrictions: Intact Restrictions Weight Bearing Restrictions Per Provider Order: No     Mobility Bed Mobility Overal bed mobility: Independent                  Transfers Overall transfer level: Independent Equipment used: None                      Balance Overall balance assessment: No apparent balance deficits (not formally assessed)                                          ADL either performed or assessed with clinical judgement   ADL Overall ADL's : Modified independent;At baseline                                             Vision Baseline Vision/History: 0 No visual deficits Vision Assessment?: No apparent visual deficits     Perception Perception: Within Functional Limits       Praxis Praxis: WFL       Pertinent Vitals/Pain Pain Assessment Pain Assessment: Faces Faces Pain Scale: Hurts a little bit Pain Location: posterior head Pain Descriptors / Indicators: Aching Pain Intervention(s): Limited activity within patient's tolerance, Monitored during session     Extremity/Trunk Assessment Upper Extremity Assessment Upper Extremity Assessment: Overall WFL for tasks assessed   Lower Extremity Assessment Lower Extremity Assessment: Overall WFL for tasks assessed   Cervical / Trunk Assessment Cervical / Trunk Assessment: Normal   Communication Communication Communication: No apparent difficulties   Cognition Arousal: Alert Behavior During Therapy: WFL for tasks assessed/performed Cognition: No apparent impairments             OT - Cognition Comments: recalled all medication  Following commands: Intact       Cueing  General Comments   Cueing Techniques: Verbal cues  BP elevated   Exercises     Shoulder Instructions      Home Living Family/patient expects to be discharged to:: Private residence Living Arrangements: Non-relatives/Friends Available Help at Discharge: Other (Comment) (friend is there but pt does not want him to help) Type of Home: Other(Comment) Home Access: Level entry     Home Layout: One level     Bathroom Shower/Tub: Chief Strategy Officer: Standard     Home Equipment: None   Additional Comments: is renting a room from a friend      Prior Functioning/Environment Prior Level of Function : Independent/Modified  Independent;Driving             Mobility Comments: pt reports he was ind without an AD. ADLs Comments: ind with ADLs and IADl's. Able to drive but doesn't have a means for transportation    OT Problem List: Decreased activity tolerance   OT Treatment/Interventions:        OT Goals(Current goals can be found in the care plan section)   Acute Rehab OT Goals Patient Stated Goal: home OT Goal Formulation: With patient Time For Goal Achievement: 01/03/24 Potential to Achieve Goals: Good   OT Frequency:       Co-evaluation              AM-PAC OT 6 Clicks Daily Activity     Outcome Measure Help from another person eating meals?: None Help from another person taking care of personal grooming?: None Help from another person toileting, which includes using toliet, bedpan, or urinal?: None Help from another person bathing (including washing, rinsing, drying)?: None Help from another person to put on and taking off regular upper body clothing?: None Help from another person to put on and taking off regular lower body clothing?: None 6 Click Score: 24   End of Session Nurse Communication: Mobility status  Activity Tolerance: Patient tolerated treatment well Patient left: in bed;with call bell/phone within reach  OT Visit Diagnosis: Dizziness and giddiness (R42)                Time: 8864-8844 OT Time Calculation (min): 20 min Charges:  OT General Charges $OT Visit: 1 Visit OT Evaluation $OT Eval Moderate Complexity: 1 Mod  Lucie Kendall, OTR/L Acute Rehabilitation Services Office [phone] Secure Chat Communication Preferred   Lucie JONETTA Kendall 01/03/2024, 12:34 PM

## 2024-01-03 NOTE — ED Notes (Signed)
 Got patient straighten up in the bed patient is sitting up drinking his milk and juice patient has call bell in reach

## 2024-01-03 NOTE — ED Notes (Addendum)
 CCMD called post transfer to new room, pt placed on central monitoring

## 2024-01-03 NOTE — Evaluation (Signed)
 Physical Therapy Evaluation Patient Details Name: Devin [name] MRN: [id number] DOB: July 27, 1960 Today's Date: 01/03/2024  History of Present Illness  Devin [name] is a 63 y.o. male presenting to [facility] on 9/3 after a fall in a restaurant when he was unable to get up off the floor. CT findings of advanced multilevel degenerative disc and degenerative joint disease with moderate to severe neuro-foraminal narrowing per MD note. Pmh: HTN, hyperlipidemia, ETOH abuse, substance abuse.  Clinical Impression  Pt is presenting close to baseline level of functioning. Due to transport coming into room and emergently needing to remove pt to another room during session a full gait analysis was not completed. Pt demonstrates no significant balance deficits in standing, adjusting pants, marching at bedside or side stepping without UE support. Pt was ind with bed mobility and sit to stand without UE support. Pt would benefit from mobility in the hospital to remain mobile for safe return home. Currently pt is presenting at baseline level of functioning and no skilled physical therapy services recommended. Pt will be discharged from skilled physical therapy services at this time; please re-consult if further needs arise.            If plan is discharge home, recommend the following: Help with stairs or ramp for entrance     Equipment Recommendations None recommended by PT     Functional Status Assessment Patient has not had a recent decline in their functional status     Precautions / Restrictions Precautions Precautions: Fall Recall of Precautions/Restrictions: Intact Restrictions Weight Bearing Restrictions Per Provider Order: No      Mobility  Bed Mobility Overal bed mobility: Independent      Transfers Overall transfer level: Independent Equipment used: None    Ambulation/Gait   Pre-gait activities: pt was able to march in place and take some side steps but transport came and was  very insistent that pt get in stretcher to be re-located. Unable to do full gait assessment at this time. pt appears stable without significant balance deficits in standing or dynamic activities without UE support.        Balance Overall balance assessment: No apparent balance deficits (not formally assessed)         Pertinent Vitals/Pain Pain Assessment Pain Assessment: Faces Faces Pain Scale: Hurts a little bit Pain Location: posterior head Pain Descriptors / Indicators: Aching Pain Intervention(s): Limited activity within patient's tolerance, Monitored during session    Home Living Family/patient expects to be discharged to:: Private residence Living Arrangements: Non-relatives/Friends (living with friend in [place]) Available Help at Discharge: Other (Comment) (no help that pt knows of) Type of Home: Other(Comment) (condo) Home Access: Level entry       Home Layout: One level Home Equipment: None      Prior Function Prior Level of Function : Independent/Modified Independent;Driving     Mobility Comments: pt reports he was ind without an AD. ADLs Comments: ind with ADLs and IADl's. Able to drive but doesn't have a means for transportation     Extremity/Trunk Assessment   Upper Extremity Assessment Upper Extremity Assessment: Defer to OT evaluation    Lower Extremity Assessment Lower Extremity Assessment: Overall WFL for tasks assessed    Cervical / Trunk Assessment Cervical / Trunk Assessment: Normal  Communication   Communication Communication: No apparent difficulties    Cognition Arousal: Alert Behavior During Therapy: WFL for tasks assessed/performed   PT - Cognitive impairments: No apparent impairments     Following commands:  Intact       Cueing Cueing Techniques: Verbal cues     General Comments General comments (skin integrity, edema, etc.): HR/O2 sats remained stable throughout session        Assessment/Plan    PT Assessment  Patient does not need any further PT services         PT Goals (Current goals can be found in the Care Plan section)  Acute Rehab PT Goals PT Goal Formulation: All assessment and education complete, DC therapy     AM-PAC PT 6 Clicks Mobility  Outcome Measure Help needed turning from your back to your side while in a flat bed without using bedrails?: None Help needed moving from lying on your back to sitting on the side of a flat bed without using bedrails?: None Help needed moving to and from a bed to a chair (including a wheelchair)?: None Help needed standing up from a chair using your arms (e.g., wheelchair or bedside chair)?: None Help needed to walk in hospital room?: None Help needed climbing 3-5 steps with a railing? : A Little 6 Click Score: 23    End of Session Equipment Utilized During Treatment: Gait belt Activity Tolerance: Patient tolerated treatment well Patient left: in bed;Other (comment) (transport taking patient.)        Time: 0950-1005 PT Time Calculation (min) (ACUTE ONLY): 15 min   Charges:   PT Evaluation $PT Eval Low Complexity: 1 Low   PT General Charges $$ ACUTE PT VISIT: 1 Visit        Dorothyann Maier, DPT, CLT  Acute Rehabilitation Services Office: [phone] (Secure chat preferred)   Dorothyann VEAR Maier 01/03/2024, 10:08 AM

## 2024-01-03 NOTE — Plan of Care (Signed)
  Problem: Education: Goal: Knowledge of General Education information will improve Description: Including pain rating scale, medication(s)/side effects and non-pharmacologic comfort measures Outcome: Progressing   Problem: Health Behavior/Discharge Planning: Goal: Ability to manage health-related needs will improve Outcome: Progressing   Problem: Clinical Measurements: Goal: Ability to maintain clinical measurements within normal limits will improve Outcome: Progressing Goal: Will remain free from infection Outcome: Progressing Goal: Diagnostic test results will improve Outcome: Progressing Goal: Respiratory complications will improve Outcome: Progressing Goal: Cardiovascular complication will be avoided Outcome: Progressing   Problem: Activity: Goal: Risk for activity intolerance will decrease Outcome: Progressing   Problem: Nutrition: Goal: Adequate nutrition will be maintained Outcome: Progressing   Problem: Coping: Goal: Level of anxiety will decrease Outcome: Progressing   Problem: Elimination: Goal: Will not experience complications related to bowel motility Outcome: Progressing Goal: Will not experience complications related to urinary retention Outcome: Progressing   Problem: Pain Managment: Goal: General experience of comfort will improve and/or be controlled Outcome: Progressing   Problem: Safety: Goal: Ability to remain free from injury will improve Outcome: Progressing   Problem: Skin Integrity: Goal: Risk for impaired skin integrity will decrease Outcome: Progressing   Problem: Education: Goal: Knowledge of disease or condition will improve Outcome: Progressing Goal: Understanding of discharge needs will improve Outcome: Progressing   Problem: Health Behavior/Discharge Planning: Goal: Ability to identify changes in lifestyle to reduce recurrence of condition will improve Outcome: Progressing Goal: Identification of resources available to  assist in meeting health care needs will improve Outcome: Progressing   Problem: Physical Regulation: Goal: Complications related to the disease process, condition or treatment will be avoided or minimized Outcome: Progressing   Problem: Safety: Goal: Ability to remain free from injury will improve Outcome: Progressing

## 2024-01-03 NOTE — Progress Notes (Signed)
 CSW added substance abuse resources to patient's AVS.  Niels Portugal, MSW, LCSW Transitions of Care  Clinical Social Worker II [phone]

## 2024-01-03 NOTE — Discharge Instructions (Signed)

## 2024-01-03 NOTE — Plan of Care (Signed)

## 2024-01-04 ENCOUNTER — Other Ambulatory Visit (HOSPITAL_COMMUNITY): Payer: Self-pay

## 2024-01-04 DIAGNOSIS — N179 Acute kidney failure, unspecified: Secondary | ICD-10-CM | POA: Diagnosis not present

## 2024-01-04 DIAGNOSIS — Z7189 Other specified counseling: Secondary | ICD-10-CM

## 2024-01-04 DIAGNOSIS — F1010 Alcohol abuse, uncomplicated: Secondary | ICD-10-CM | POA: Diagnosis not present

## 2024-01-04 DIAGNOSIS — I10 Essential (primary) hypertension: Secondary | ICD-10-CM

## 2024-01-04 DIAGNOSIS — E876 Hypokalemia: Secondary | ICD-10-CM | POA: Diagnosis not present

## 2024-01-04 DIAGNOSIS — E872 Acidosis, unspecified: Secondary | ICD-10-CM

## 2024-01-04 LAB — BASIC METABOLIC PANEL WITH GFR
Anion gap: 12 (ref 5–15)
BUN: 13 mg/dL (ref 8–23)
CO2: 21 mmol/L — ABNORMAL LOW (ref 22–32)
Calcium: 9.3 mg/dL (ref 8.9–10.3)
Chloride: 101 mmol/L (ref 98–111)
Creatinine, Ser: 1 mg/dL (ref 0.61–1.24)
GFR, Estimated: >60 mL/min (ref >60)
Glucose, Bld: 96 mg/dL (ref 70–99)
Potassium: 4.2 mmol/L (ref 3.5–5.1)
Sodium: 134 mmol/L — ABNORMAL LOW (ref 135–145)

## 2024-01-04 LAB — MAGNESIUM: Magnesium: 1.6 mg/dL — ABNORMAL LOW (ref 1.7–2.4)

## 2024-01-04 LAB — PHOSPHORUS: Phosphorus: 3.7 mg/dL (ref 2.5–4.6)

## 2024-01-04 MED ORDER — FOLIC ACID 1 MG PO TABS
1.0000 mg | ORAL_TABLET | Freq: Every day | ORAL | 0 refills | Status: AC
  Filled 2024-01-04: qty 90, 90d supply, fill #0

## 2024-01-04 MED ORDER — MAGNESIUM OXIDE 400 MG PO TABS
400.0000 mg | ORAL_TABLET | Freq: Every day | ORAL | 0 refills | Status: DC
  Filled 2024-01-04: qty 30, 30d supply, fill #0

## 2024-01-04 MED ORDER — MULTI-VITAMIN/MINERALS PO TABS
1.0000 | ORAL_TABLET | Freq: Every day | ORAL | 0 refills | Status: AC
  Filled 2024-01-04: qty 90, 90d supply, fill #0

## 2024-01-04 NOTE — Progress Notes (Signed)
 Mobility Specialist Progress Note:   01/04/24 0915  Mobility  Activity Ambulated with assistance  Level of Assistance Contact guard assist, steadying assist  Assistive Device None  Distance Ambulated (ft) 400 ft  Activity Response Tolerated well  Mobility Referral Yes  Mobility visit 1 Mobility  Mobility Specialist Start Time (ACUTE ONLY) 0915  Mobility Specialist Stop Time (ACUTE ONLY) 0925  Mobility Specialist Time Calculation (min) (ACUTE ONLY) 10 min   Pt agreeable to mobility session. Required contact assist to ambulate with no AD use. Pt generally unsteady, however x1 LOB when turning. C/o dizziness throughout. BP elevated 165/102 (121). Pt back in bed with all needs met.   Therisa Rana Mobility Specialist Please contact via SecureChat or  Rehab office at [phone]

## 2024-01-04 NOTE — Progress Notes (Signed)
 Transition of Care [facility]) - Inpatient Brief Assessment   Patient Details  Name: Devin [name] MRN: [id number] Date of Birth: 1960/12/03  Transition of Care [facility]) CM/SW Contact:    Rosaline JONELLE Joe, RN Phone Number: 01/04/2024, 9:18 AM   Clinical Narrative: Patient admitted to the hospital for fall in restaurant - admitted for renal failure.  Patient lives with friends and is normally independent.  Patient evaluated by PT with no recommendations for needs.  Patient is smoker and current marijuana use - resources provided.  Resources for Medicaid transportation provided in AVS.  No other IP Care management needs at this time.   Transition of Care Asessment: Insurance and Status: (P) Insurance coverage has been reviewed Patient has primary care physician: (P) No (PCP follow up placed with [facility] for patient to call and schedule for hospital follow up) Home environment has been reviewed: (P) from home with friends Prior level of function:: (P) independent Prior/Current Home Services: (P) No current home services Social Drivers of Health Review: (P) SDOH reviewed interventions complete Readmission risk has been reviewed: (P) Yes Transition of care needs: (P) no transition of care needs at this time

## 2024-01-04 NOTE — Discharge Summary (Signed)
 Physician Discharge Summary   Patient: Devin [name] MRN: [id number] DOB: 06/02/1960  Admit date:     01/02/2024  Discharge date: 01/04/2024  Discharge Physician: Concepcion Riser   PCP: Pcp, No   Recommendations at discharge:    PCP follow up in 1 week, resources provided.  Discharge Diagnoses: Principal Problem:   AKI (acute kidney injury) (HCC) Active Problems:   Hypokalemia   Hypomagnesemia  Resolved Problems:   * No resolved hospital problems. *  Hospital Course: Devin [name] is a 63 y.o. male with medical history significant for alcohol abuse, erectile dysfunction, avascular necrosis, who presents to the ER after a fall at a restaurant.  States his legs gave out and he could not get back up.  Denies losing consciousness.  Denies any cardio pulmonary or GI symptoms.  Upon EMS arrival, the patient was hypotensive and tachycardic.  He received 1 L IV fluid bolus LR en route via EMS.     In the ER, tachycardic and tachypneic with an elevated alcohol level 245.  Lab work noticeable for elevated creatinine 2.24 from baseline of 1.3.  Admitted by [facility], hospitalist service for AKI, metabolic acidosis, electrolyte abnormalities.   Assessment and Plan: AKI in the setting of alcohol abuse and intoxication Baseline creatinine appears to be 1.3 GFR of > 60 Presented with creatinine of 2.24 with GFR 32. Creatinine improved to baseline with IV fluids. He is tolerating diet, able to ambulate well.   High anion gap metabolic acidosis Anion gap 16, serum bicarb 20, improved with fluids.   Hypokalemia- Repleted with oral kcl supplements.   Hypomagnesemia- Oral mag ox prescribed. Advised PCP follow up for repeat electrolytes upon discharge.   Alcohol abuse and intoxication Alcohol level on presentation 245 on 01/02/2024. No withdrawal symptoms noted. Ill effects of alcohol educated. TOC provided resources for alcohol cessation.  Hypertension- He takes metoprolol ,  lisinopril , states he is compliant. States he has meds at home, does not want scripts.   History of polysubstance abuse including cocaine, THC Did discuss importance of quitting illicit drugs. He understands.  Generalized weakness with fall Likely due to alcohol abuse, dehydration, and possibly electrolytes derangement He is able to ambulate. He is at high risk for readmission givem his alcohol and substance use.       Consultants: none Procedures performed: none  Disposition: Home Diet recommendation:  Discharge Diet Orders (From admission, onward)     Start     Ordered   01/04/24 0000  Diet - low sodium heart healthy        01/04/24 1125           Cardiac diet DISCHARGE MEDICATION: Allergies as of 01/04/2024   No Known Allergies      Medication List     TAKE these medications    allopurinol  300 MG tablet Commonly known as: ZYLOPRIM  Take 300 mg by mouth daily.   folic acid  1 MG tablet Commonly known as: FOLVITE  Take 1 tablet (1 mg total) by mouth daily.   lisinopril  20 MG tablet Commonly known as: ZESTRIL  Take 20 mg by mouth daily.   magnesium  oxide 400 MG tablet Commonly known as: MAG-OX Take 1 tablet (400 mg total) by mouth daily.   metoprolol  succinate 50 MG 24 hr tablet Commonly known as: TOPROL -XL Take 3 tablets (150 mg total) by mouth daily.   multivitamin with minerals tablet Take 1 tablet by mouth daily.   ondansetron  4 MG tablet Commonly known as: ZOFRAN  Take  1 tablet (4 mg total) by mouth every 6 (six) hours.   pantoprazole  40 MG tablet Commonly known as: Protonix  Take 1 tablet (40 mg total) by mouth daily.        Follow-up Information     [facility] Follow up.   Specialty: Internal Medicine Why: Please call the office to schedule a hospital follow up in the next 7-10 days. Contact information: [address] [place]    [address] [phone] Additional information: [address] [place], [place] [address]               Discharge Exam: Fredricka Weights   01/02/24 1959  Weight: 71 kg      01/04/2024    7:42 AM 01/04/2024    3:51 AM 01/04/2024   12:13 AM  Vitals with BMI  Systolic 146 146 860  Diastolic 111 103 891  Pulse 76 79 72    General - Elderly African American male, no apparent distress HEENT - PERRLA, EOMI, atraumatic head, non tender sinuses. Lung - Clear, diffuse rales, no rhonchi, wheezes. Heart - S1, S2 heard, no murmurs, rubs, no pedal edema. Abdomen - Soft, non tender, bowel sounds good Neuro - Alert, awake and oriented x 3, non focal exam. Skin - Warm and dry.  Condition at discharge: stable  The results of significant diagnostics from this hospitalization (including imaging, microbiology, ancillary and laboratory) are listed below for reference.   Imaging Studies: CT Head Wo Contrast Result Date: 01/02/2024 CLINICAL DATA:  Neck trauma, intoxicated or obtunded (Age >= 16y); Head trauma, abnormal mental status (Age 4-64y) EXAM: CT HEAD WITHOUT CONTRAST CT CERVICAL SPINE WITHOUT CONTRAST TECHNIQUE: Multidetector CT imaging of the head and cervical spine was performed following the standard protocol without intravenous contrast. Multiplanar CT image reconstructions of the cervical spine were also generated. RADIATION DOSE REDUCTION: This exam was performed according to the departmental dose-optimization program which includes automated exposure control, adjustment of the mA and/or kV according to patient size and/or use of iterative reconstruction technique. COMPARISON:  08/13/2010 FINDINGS: CT HEAD FINDINGS Brain: Normal anatomic configuration. Parenchymal volume loss is commensurate with the patient's age. Mild periventricular white matter changes are present likely reflecting the sequela of small vessel ischemia. No abnormal intra or extra-axial mass lesion or fluid collection. No  abnormal mass effect or midline shift. No evidence of acute intracranial hemorrhage or infarct. Ventricular size is normal. Cerebellum unremarkable. Vascular: No asymmetric hyperdense vasculature at the skull base. Skull: Intact Sinuses/Orbits: Paranasal sinuses are clear. Orbits are unremarkable. Other: Mastoid air cells and middle ear cavities are clear. Small left parietal scalp hematoma CT CERVICAL SPINE FINDINGS Alignment: 3 mm anterolisthesis C7-T1, likely degenerative in nature. Mild cervical kyphosis. Skull base and vertebrae: Craniocervical alignment is normal. The atlantodental interval is not widened. No acute fracture of the cervical spine. Ankylosis of the left facet joints of C2-C4 as well as the vertebral bodies and right facet joints of C2-C3. Soft tissues and spinal canal: No prevertebral fluid or swelling. No visible canal hematoma. Disc levels: Disc space narrowing endplate remodeling and vacuum disc phenomena is seen throughout the cervical spine in keeping with changes of advanced degenerative disc disease, most severe at C4-C6. Prevertebral soft tissues are not thickened on sagittal reformats. No high-grade canal stenosis. Multilevel uncovertebral and facet arthrosis results in multilevel moderate to severe neuroforaminal narrowing, most severe on the right at C4-5  C6-7 and C7-T1 and on the left at C3-4, C4-5, and C7-T1. Upper chest: Negative. Other: None IMPRESSION: 1. No acute intracranial abnormality. No calvarial fracture. Small left parietal scalp hematoma. 2. No acute fracture or listhesis of the cervical spine. 3. 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 Electronically Signed   By: Dorethia Molt M.D.   On: 01/02/2024 21:37   CT Cervical Spine Wo Contrast Result Date: 01/02/2024 CLINICAL DATA:  Neck trauma, intoxicated or obtunded (Age >= 16y); Head trauma, abnormal  mental status (Age 67-64y) EXAM: CT HEAD WITHOUT CONTRAST CT CERVICAL SPINE WITHOUT CONTRAST TECHNIQUE: Multidetector CT imaging of the head and cervical spine was performed following the standard protocol without intravenous contrast. Multiplanar CT image reconstructions of the cervical spine were also generated. RADIATION DOSE REDUCTION: This exam was performed according to the departmental dose-optimization program which includes automated exposure control, adjustment of the mA and/or kV according to patient size and/or use of iterative reconstruction technique. COMPARISON:  08/13/2010 FINDINGS: CT HEAD FINDINGS Brain: Normal anatomic configuration. Parenchymal volume loss is commensurate with the patient's age. Mild periventricular white matter changes are present likely reflecting the sequela of small vessel ischemia. No abnormal intra or extra-axial mass lesion or fluid collection. No abnormal mass effect or midline shift. No evidence of acute intracranial hemorrhage or infarct. Ventricular size is normal. Cerebellum unremarkable. Vascular: No asymmetric hyperdense vasculature at the skull base. Skull: Intact Sinuses/Orbits: Paranasal sinuses are clear. Orbits are unremarkable. Other: Mastoid air cells and middle ear cavities are clear. Small left parietal scalp hematoma CT CERVICAL SPINE FINDINGS Alignment: 3 mm anterolisthesis C7-T1, likely degenerative in nature. Mild cervical kyphosis. Skull base and vertebrae: Craniocervical alignment is normal. The atlantodental interval is not widened. No acute fracture of the cervical spine. Ankylosis of the left facet joints of C2-C4 as well as the vertebral bodies and right facet joints of C2-C3. Soft tissues and spinal canal: No prevertebral fluid or swelling. No visible canal hematoma. Disc levels: Disc space narrowing endplate remodeling and vacuum disc phenomena is seen throughout the cervical spine in keeping with changes of advanced degenerative disc disease,  most severe at C4-C6. Prevertebral soft tissues are not thickened on sagittal reformats. No high-grade canal stenosis. Multilevel uncovertebral and facet arthrosis results in multilevel moderate to severe neuroforaminal narrowing, most severe on the right at C4-5 C6-7 and C7-T1 and on the left at C3-4, C4-5, and C7-T1. Upper chest: Negative. Other: None IMPRESSION: 1. No acute intracranial abnormality. No calvarial fracture. Small left parietal scalp hematoma. 2. No acute fracture or listhesis of the cervical spine. 3. 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 Electronically Signed   By: Dorethia Molt M.D.   On: 01/02/2024 21:37   DG Chest Portable 1 View Result Date: 01/02/2024 CLINICAL DATA:  Fall. EXAM: PORTABLE CHEST 1 VIEW COMPARISON:  Chest x-ray 12/21/2018. FINDINGS: The heart size and mediastinal contours are within normal limits. Both lungs are clear. There is a healed left sixth rib fracture. No acute fractures are seen. IMPRESSION: No active disease. Electronically Signed   By: Greig Pique M.D.   On: 01/02/2024 20:53   DG Chest 2 View Result Date: 12/21/2023 CLINICAL DATA:  Chest pain EXAM: CHEST - 2 VIEW COMPARISON:  12/17/2023 FINDINGS: Low lung volumes. Left basilar opacity, favor atelectasis. No confluent opacity on the right. No effusions. Heart and mediastinal  contours are within normal limits. IMPRESSION: Low lung volumes, left base atelectasis. Electronically Signed   By: Franky Crease M.D.   On: 12/21/2023 02:09   CT Angio Chest PE W and/or Wo Contrast Result Date: 12/18/2023 CLINICAL DATA:  Pulmonary embolism (PE) suspected, high prob. Chest pain EXAM: CT ANGIOGRAPHY CHEST WITH CONTRAST TECHNIQUE: Multidetector CT imaging of the chest was performed using the standard protocol during bolus administration of intravenous contrast. Multiplanar CT image reconstructions and MIPs  were obtained to evaluate the vascular anatomy. RADIATION DOSE REDUCTION: This exam was performed according to the departmental dose-optimization program which includes automated exposure control, adjustment of the mA and/or kV according to patient size and/or use of iterative reconstruction technique. CONTRAST:  75mL OMNIPAQUE  IOHEXOL  350 MG/ML SOLN COMPARISON:  09/05/2023 FINDINGS: Cardiovascular: No filling defects in the pulmonary arteries to suggest pulmonary emboli. Heart is normal size. Aorta is normal caliber. Mediastinum/Nodes: No mediastinal, hilar, or axillary adenopathy. Trachea and esophagus are unremarkable. Thyroid unremarkable. Lungs/Pleura: Trace right pleural effusion or pleural thickening, similar to prior study. Right basilar scarring or atelectasis. No confluent opacity or effusion on the left. Upper Abdomen: No acute findings Musculoskeletal: No acute bony abnormality. Old bilateral healed rib fractures. Review of the MIP images confirms the above findings. IMPRESSION: No evidence of pulmonary embolus. Trace right pleural effusion or pleural thickening with right basilar atelectasis or scarring, unchanged. Electronically Signed   By: Franky Crease M.D.   On: 12/18/2023 00:08   DG Chest 2 View Result Date: 12/17/2023 CLINICAL DATA:  Chest pain EXAM: CHEST - 2 VIEW COMPARISON:  09/05/2023, CT chest 09/05/2023, chest x-ray 04/04/2021 FINDINGS: Stable cardiomediastinal silhouette. Patchy atelectasis or minimal infiltrate at the left lung base. No pleural effusion or pneumothorax IMPRESSION: Patchy atelectasis or minimal infiltrate at the left lung base. Electronically Signed   By: Luke Bun M.D.   On: 12/17/2023 23:22    Microbiology: Results for orders placed or performed during the hospital encounter of 04/16/21  Resp Panel by RT-PCR (Flu A&B, Covid) Nasopharyngeal Swab     Status: None   Collection Time: 04/16/21  3:30 PM   Specimen: Nasopharyngeal Swab; Nasopharyngeal(NP) swabs in  vial transport medium  Result Value Ref Range Status   SARS Coronavirus 2 by RT PCR NEGATIVE NEGATIVE Final    Comment: (NOTE) SARS-CoV-2 target nucleic acids are NOT DETECTED.  The SARS-CoV-2 RNA is generally detectable in upper respiratory specimens during the acute phase of infection. The lowest concentration of SARS-CoV-2 viral copies this assay can detect is 138 copies/mL. A negative result does not preclude SARS-Cov-2 infection and should not be used as the sole basis for treatment or other patient management decisions. A negative result may occur with  improper specimen collection/handling, submission of specimen other than nasopharyngeal swab, presence of viral mutation(s) within the areas targeted by this assay, and inadequate number of viral copies(<138 copies/mL). A negative result must be combined with clinical observations, patient history, and epidemiological information. The expected result is Negative.  Fact Sheet for Patients:  BloggerCourse.com  Fact Sheet for Healthcare Providers:  SeriousBroker.it  This test is no t yet approved or cleared by the [place]  FDA and  has been authorized for detection and/or diagnosis of SARS-CoV-2 by FDA under an Emergency Use Authorization (EUA). This EUA will remain  in effect (meaning this test can be used) for the duration of the COVID-19 declaration under Section 564(b)(1) of the Act, 21 U.S.C.section 360bbb-3(b)(1), unless the authorization is terminated  or revoked  sooner.       Influenza A by PCR NEGATIVE NEGATIVE Final   Influenza B by PCR NEGATIVE NEGATIVE Final    Comment: (NOTE) The Xpert Xpress SARS-CoV-2/FLU/RSV plus assay is intended as an aid in the diagnosis of influenza from Nasopharyngeal swab specimens and should not be used as a sole basis for treatment. Nasal washings and aspirates are unacceptable for Xpert Xpress SARS-CoV-2/FLU/RSV testing.  Fact  Sheet for Patients: BloggerCourse.com  Fact Sheet for Healthcare Providers: SeriousBroker.it  This test is not yet approved or cleared by the [place]  FDA and has been authorized for detection and/or diagnosis of SARS-CoV-2 by FDA under an Emergency Use Authorization (EUA). This EUA will remain in effect (meaning this test can be used) for the duration of the COVID-19 declaration under Section 564(b)(1) of the Act, 21 U.S.C. section 360bbb-3(b)(1), unless the authorization is terminated or revoked.  Performed at [facility], [address], [place], [place] [address]     Labs: CBC: Recent Labs  Lab 01/02/24 2012 01/02/24 2259 01/03/24 0330  WBC 5.4  --  5.2  HGB 12.4* 12.2* 11.3*  HCT 37.8* 36.0* 33.6*  MCV 101.1*  --  100.3*  PLT 233  --  207   Basic Metabolic Panel: Recent Labs  Lab 01/02/24 2012 01/02/24 2259 01/03/24 0330 01/04/24 0935  NA 135 134* 136 134*  K 4.2 4.1 3.4* 4.2  CL 99  --  101 101  CO2 20*  --  21* 21*  GLUCOSE 118*  --  115* 96  BUN 11  --  11 13  CREATININE 2.24*  --  1.45* 1.00  CALCIUM  8.7*  --  8.7* 9.3  MG  --   --  1.4* 1.6*  PHOS  --   --  5.2* 3.7   Liver Function Tests: No results for input(s): AST, ALT, ALKPHOS, BILITOT, PROT, ALBUMIN in the last 168 hours. CBG: No results for input(s): GLUCAP in the last 168 hours.  Discharge time spent: 34 minutes.  Signed: Concepcion Riser, MD [facility] 01/04/2024

## 2024-01-11 ENCOUNTER — Observation Stay (HOSPITAL_COMMUNITY)
Admission: EM | Admit: 2024-01-11 | Discharge: 2024-01-15 | Disposition: A | Discharge status: Home / Self Care | Attending: Internal Medicine | Admitting: Internal Medicine

## 2024-01-11 ENCOUNTER — Emergency Department (HOSPITAL_COMMUNITY)

## 2024-01-11 ENCOUNTER — Other Ambulatory Visit: Payer: Self-pay

## 2024-01-11 DIAGNOSIS — R197 Diarrhea, unspecified: Secondary | ICD-10-CM | POA: Diagnosis not present

## 2024-01-11 DIAGNOSIS — R0789 Other chest pain: Secondary | ICD-10-CM | POA: Diagnosis not present

## 2024-01-11 DIAGNOSIS — Z79899 Other long term (current) drug therapy: Secondary | ICD-10-CM | POA: Insufficient documentation

## 2024-01-11 DIAGNOSIS — E8721 Acute metabolic acidosis: Secondary | ICD-10-CM | POA: Insufficient documentation

## 2024-01-11 DIAGNOSIS — F17210 Nicotine dependence, cigarettes, uncomplicated: Secondary | ICD-10-CM | POA: Diagnosis not present

## 2024-01-11 DIAGNOSIS — Z7901 Long term (current) use of anticoagulants: Secondary | ICD-10-CM | POA: Insufficient documentation

## 2024-01-11 DIAGNOSIS — E871 Hypo-osmolality and hyponatremia: Secondary | ICD-10-CM | POA: Diagnosis not present

## 2024-01-11 DIAGNOSIS — F1020 Alcohol dependence, uncomplicated: Secondary | ICD-10-CM | POA: Diagnosis not present

## 2024-01-11 DIAGNOSIS — R002 Palpitations: Secondary | ICD-10-CM | POA: Diagnosis not present

## 2024-01-11 DIAGNOSIS — R079 Chest pain, unspecified: Secondary | ICD-10-CM | POA: Diagnosis present

## 2024-01-11 DIAGNOSIS — R55 Syncope and collapse: Secondary | ICD-10-CM | POA: Diagnosis not present

## 2024-01-11 DIAGNOSIS — I10 Essential (primary) hypertension: Secondary | ICD-10-CM | POA: Diagnosis present

## 2024-01-11 DIAGNOSIS — R110 Nausea: Secondary | ICD-10-CM | POA: Diagnosis not present

## 2024-01-11 DIAGNOSIS — R0602 Shortness of breath: Secondary | ICD-10-CM | POA: Insufficient documentation

## 2024-01-11 DIAGNOSIS — E8729 Other acidosis: Secondary | ICD-10-CM

## 2024-01-11 DIAGNOSIS — F1910 Other psychoactive substance abuse, uncomplicated: Secondary | ICD-10-CM | POA: Diagnosis not present

## 2024-01-11 DIAGNOSIS — F1090 Alcohol use, unspecified, uncomplicated: Secondary | ICD-10-CM

## 2024-01-11 LAB — CBC
HCT: 42.2 % (ref 39.0–52.0)
Hemoglobin: 14.3 g/dL (ref 13.0–17.0)
MCH: 33.3 pg (ref 26.0–34.0)
MCHC: 33.9 g/dL (ref 30.0–36.0)
MCV: 98.1 fL (ref 80.0–100.0)
Platelets: 306 K/uL (ref 150–400)
RBC: 4.3 MIL/uL (ref 4.22–5.81)
RDW: 14.9 % (ref 11.5–15.5)
WBC: 5.7 K/uL (ref 4.0–10.5)
nRBC: 0 % (ref 0.0–0.2)

## 2024-01-11 LAB — BASIC METABOLIC PANEL WITH GFR
Anion gap: 18 — ABNORMAL HIGH (ref 5–15)
BUN: 11 mg/dL (ref 8–23)
CO2: 18 mmol/L — ABNORMAL LOW (ref 22–32)
Calcium: 8.7 mg/dL — ABNORMAL LOW (ref 8.9–10.3)
Chloride: 97 mmol/L — ABNORMAL LOW (ref 98–111)
Creatinine, Ser: 1.02 mg/dL (ref 0.61–1.24)
GFR, Estimated: >60 mL/min (ref >60)
Glucose, Bld: 76 mg/dL (ref 70–99)
Potassium: 3.9 mmol/L (ref 3.5–5.1)
Sodium: 133 mmol/L — ABNORMAL LOW (ref 135–145)

## 2024-01-11 LAB — TROPONIN I (HIGH SENSITIVITY): Troponin I (High Sensitivity): 22 ng/L — ABNORMAL HIGH (ref <18)

## 2024-01-11 MED ORDER — LACTATED RINGERS IV BOLUS
1000.0000 mL | Freq: Once | INTRAVENOUS | Status: AC
  Administered 2024-01-11: 1000 mL via INTRAVENOUS

## 2024-01-11 MED ORDER — THIAMINE HCL 100 MG/ML IJ SOLN
100.0000 mg | Freq: Once | INTRAMUSCULAR | Status: AC
  Administered 2024-01-11: 100 mg via INTRAVENOUS
  Filled 2024-01-11: qty 2

## 2024-01-11 MED ORDER — FOLIC ACID 1 MG PO TABS
1.0000 mg | ORAL_TABLET | Freq: Once | ORAL | Status: AC
  Administered 2024-01-11: 1 mg via ORAL
  Filled 2024-01-11: qty 1

## 2024-01-11 MED ORDER — IOHEXOL 350 MG/ML SOLN
75.0000 mL | Freq: Once | INTRAVENOUS | Status: AC | PRN
  Administered 2024-01-11: 75 mL via INTRAVENOUS
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
## 2024-01-11 NOTE — ED Triage Notes (Signed)
 Called EMS for chest pain and etOH.

## 2024-01-11 NOTE — ED Notes (Signed)
 Pt provided with sandwich, per provider ok for patient to eat. Pt remains on cardiac monitoring, denies CP at this time.

## 2024-01-12 ENCOUNTER — Encounter (HOSPITAL_COMMUNITY): Payer: Self-pay | Admitting: Internal Medicine

## 2024-01-12 ENCOUNTER — Observation Stay (HOSPITAL_COMMUNITY)

## 2024-01-12 DIAGNOSIS — R079 Chest pain, unspecified: Secondary | ICD-10-CM | POA: Diagnosis present

## 2024-01-12 DIAGNOSIS — F1090 Alcohol use, unspecified, uncomplicated: Secondary | ICD-10-CM

## 2024-01-12 DIAGNOSIS — E871 Hypo-osmolality and hyponatremia: Secondary | ICD-10-CM

## 2024-01-12 DIAGNOSIS — E8729 Other acidosis: Secondary | ICD-10-CM

## 2024-01-12 LAB — COMPREHENSIVE METABOLIC PANEL WITH GFR
ALT: 26 U/L (ref 0–44)
AST: 50 U/L — ABNORMAL HIGH (ref 15–41)
Albumin: 3.2 g/dL — ABNORMAL LOW (ref 3.5–5.0)
Alkaline Phosphatase: 86 U/L (ref 38–126)
Anion gap: 14 (ref 5–15)
BUN: 9 mg/dL (ref 8–23)
CO2: 19 mmol/L — ABNORMAL LOW (ref 22–32)
Calcium: 8.6 mg/dL — ABNORMAL LOW (ref 8.9–10.3)
Chloride: 103 mmol/L (ref 98–111)
Creatinine, Ser: 0.94 mg/dL (ref 0.61–1.24)
GFR, Estimated: >60 mL/min (ref >60)
Glucose, Bld: 93 mg/dL (ref 70–99)
Potassium: 3.9 mmol/L (ref 3.5–5.1)
Sodium: 136 mmol/L (ref 135–145)
Total Bilirubin: 0.5 mg/dL (ref 0.0–1.2)
Total Protein: 7.3 g/dL (ref 6.5–8.1)

## 2024-01-12 LAB — MRSA NEXT GEN BY PCR, NASAL: MRSA by PCR Next Gen: NOT DETECTED

## 2024-01-12 LAB — RESPIRATORY PANEL BY PCR

## 2024-01-12 LAB — RESP PANEL BY RT-PCR (RSV, FLU A&B, COVID)  RVPGX2
Influenza A by PCR: NEGATIVE
Influenza B by PCR: NEGATIVE
Resp Syncytial Virus by PCR: NEGATIVE
SARS Coronavirus 2 by RT PCR: NEGATIVE

## 2024-01-12 LAB — ECHOCARDIOGRAM COMPLETE
Area-P 1/2: 4.04 cm2
Height: 69 in
S' Lateral: 2.3 cm
Weight: 2504.43 [oz_av]

## 2024-01-12 LAB — MAGNESIUM: Magnesium: 2 mg/dL (ref 1.7–2.4)

## 2024-01-12 LAB — RAPID URINE DRUG SCREEN, HOSP PERFORMED
Amphetamines: NOT DETECTED
Barbiturates: NOT DETECTED
Benzodiazepines: NOT DETECTED
Cocaine: POSITIVE — AB
Opiates: NOT DETECTED
Tetrahydrocannabinol: POSITIVE — AB

## 2024-01-12 LAB — TROPONIN I (HIGH SENSITIVITY): Troponin I (High Sensitivity): 23 ng/L — ABNORMAL HIGH (ref <18)

## 2024-01-12 MED ORDER — ENOXAPARIN SODIUM 40 MG/0.4ML IJ SOSY
40.0000 mg | PREFILLED_SYRINGE | INTRAMUSCULAR | Status: DC
  Administered 2024-01-12 – 2024-01-14 (×3): 40 mg via SUBCUTANEOUS
  Filled 2024-01-12 (×3): qty 0.4

## 2024-01-12 MED ORDER — THIAMINE MONONITRATE 100 MG PO TABS
100.0000 mg | ORAL_TABLET | Freq: Every day | ORAL | Status: DC
  Administered 2024-01-12 – 2024-01-15 (×4): 100 mg via ORAL
  Filled 2024-01-12 (×4): qty 1

## 2024-01-12 MED ORDER — ACETAMINOPHEN 650 MG RE SUPP: 650.0000 mg | Freq: Four times a day (QID) | RECTAL | Status: DC | PRN

## 2024-01-12 MED ORDER — METOPROLOL TARTRATE 50 MG PO TABS
100.0000 mg | ORAL_TABLET | Freq: Every day | ORAL | Status: DC
  Administered 2024-01-12 – 2024-01-15 (×4): 100 mg via ORAL
  Filled 2024-01-12: qty 2
  Filled 2024-01-12: qty 4
  Filled 2024-01-12 (×2): qty 2

## 2024-01-12 MED ORDER — GUAIFENESIN-DM 100-10 MG/5ML PO SYRP: 15.0000 mL | ORAL_SOLUTION | Freq: Four times a day (QID) | ORAL | Status: DC | PRN

## 2024-01-12 MED ORDER — DM-GUAIFENESIN ER 30-600 MG PO TB12
1.0000 | ORAL_TABLET | Freq: Two times a day (BID) | ORAL | Status: DC
  Administered 2024-01-12 – 2024-01-15 (×7): 1 via ORAL
  Filled 2024-01-12 (×7): qty 1

## 2024-01-12 MED ORDER — THIAMINE HCL 100 MG/ML IJ SOLN: 100.0000 mg | Freq: Every day | INTRAMUSCULAR | Status: DC

## 2024-01-12 MED ORDER — LORAZEPAM 1 MG PO TABS
1.0000 mg | ORAL_TABLET | ORAL | Status: AC | PRN
  Administered 2024-01-13 – 2024-01-14 (×2): 1 mg via ORAL
  Filled 2024-01-12 (×2): qty 1

## 2024-01-12 MED ORDER — GUAIFENESIN-DM 100-10 MG/5ML PO SYRP
5.0000 mL | ORAL_SOLUTION | Freq: Four times a day (QID) | ORAL | Status: DC | PRN
  Administered 2024-01-12 – 2024-01-15 (×5): 5 mL via ORAL
  Filled 2024-01-12 (×4): qty 5

## 2024-01-12 MED ORDER — SODIUM CHLORIDE 0.9 % IV SOLN: INTRAVENOUS | Status: AC

## 2024-01-12 MED ORDER — ONDANSETRON HCL 4 MG/2ML IJ SOLN
4.0000 mg | Freq: Four times a day (QID) | INTRAMUSCULAR | Status: DC | PRN
  Administered 2024-01-14 – 2024-01-15 (×2): 4 mg via INTRAVENOUS
  Filled 2024-01-12 (×2): qty 2

## 2024-01-12 MED ORDER — HYDRALAZINE HCL 20 MG/ML IJ SOLN
10.0000 mg | Freq: Three times a day (TID) | INTRAMUSCULAR | Status: DC | PRN
  Administered 2024-01-13: 10 mg via INTRAVENOUS
  Filled 2024-01-12: qty 1

## 2024-01-12 MED ORDER — LORAZEPAM 2 MG/ML IJ SOLN
1.0000 mg | INTRAMUSCULAR | Status: AC | PRN
  Administered 2024-01-12: 2 mg via INTRAVENOUS
  Filled 2024-01-12: qty 1

## 2024-01-12 MED ORDER — FOLIC ACID 1 MG PO TABS
1.0000 mg | ORAL_TABLET | Freq: Every day | ORAL | Status: DC
  Administered 2024-01-12 – 2024-01-15 (×4): 1 mg via ORAL
  Filled 2024-01-12 (×4): qty 1

## 2024-01-12 MED ORDER — ACETAMINOPHEN 325 MG PO TABS: 650.0000 mg | ORAL_TABLET | Freq: Four times a day (QID) | ORAL | Status: DC | PRN

## 2024-01-12 MED ORDER — LISINOPRIL 20 MG PO TABS
20.0000 mg | ORAL_TABLET | Freq: Every day | ORAL | Status: DC
Start: 2024-01-12 — End: 2024-01-14
  Administered 2024-01-12 – 2024-01-14 (×3): 20 mg via ORAL
  Filled 2024-01-12 (×3): qty 1

## 2024-01-12 MED ORDER — ADULT MULTIVITAMIN W/MINERALS CH
1.0000 | ORAL_TABLET | Freq: Every day | ORAL | Status: DC
  Administered 2024-01-12 – 2024-01-15 (×4): 1 via ORAL
  Filled 2024-01-12 (×4): qty 1

## 2024-01-12 MED ORDER — LOPERAMIDE HCL 2 MG PO CAPS
2.0000 mg | ORAL_CAPSULE | ORAL | Status: DC | PRN
  Administered 2024-01-12: 2 mg via ORAL
  Filled 2024-01-12: qty 1

## 2024-01-12 NOTE — Plan of Care (Signed)

## 2024-01-12 NOTE — ED Notes (Signed)
 Provided patient with sandhwich and water .

## 2024-01-12 NOTE — Progress Notes (Signed)
 PROGRESS NOTE  Devin [name] FMW:[id number] DOB: 1961-01-20 DOA: 01/11/2024 PCP: Pcp, No  HPI/Recap of past 24 hours: Devin [name] is a 63 y.o. male with medical history significant of alcohol use disorder, alcoholic liver disease, polysubstance abuse, chronic back pain, depression, esophagitis, GERD, gout, hypertension, hyperlipidemia, avascular necrosis. Recent hospital admission 9/3-9/5 for AKI and electrolyte abnormalities in the setting of alcohol abuse and intoxication. Patient presented c/o right sided/central chest pain associated with dyspnea, palpitations, and near syncope for the past few days. PTA, had 24 ounces of beer after which he started having symptoms, which prompted him to come into the ED to be evaluated. In the ED, pt noted to be tachycardic, with uncontrolled HTN, labs showed troponin 22> 23.  EKG without acute ischemic changes.  CTA chest negative for PE. Patient admitted for further management.    Today, pt still with intermittent tachycardia with uncontrolled BP.  Still with some central chest pain, denies its worsening.  Admits to cocaine, but denies any recent use, although UDS positive for cocaine.  Admits to still drinking.   Assessment/Plan: Principal Problem:   Chest pain Active Problems:   Essential hypertension   Alcohol use disorder   High anion gap metabolic acidosis   Hyponatremia   Right-sided chest pain, palpitations, dyspnea, near syncope In the setting of ongoing alcohol abuse, cocaine abuse EKG showing sinus tachycardia, diffuse T wave inversions similar to prior EKGs dating back to 12/17/2023 Troponin 22 > 23, not consistent with ACS CTA chest not suggestive of PE, pneumonia, pneumothorax, or aortic dissection Echocardiogram pending Telemetry   Alcohol use disorder Polysubstance abuse At high risk for withdrawal UDS positive for cocaine and marijuana S/p IV fluids Placed on CIWA protocol; Ativan  as needed Thiamine , folate,  and multivitamin.   Hypertension Uncontrolled, likely from withdrawal  Continue lisinopril  and metoprolol  IV hydralazine  as needed    Estimated body mass index is 23.11 kg/m as calculated from the following:   Height as of this encounter: 5' 9 (1.753 m).   Weight as of this encounter: 71 kg.     Code Status: Full  Family Communication: None at bedside  Disposition Plan: Status is: Observation The patient remains OBS appropriate and will d/c before 2 midnights.      Consultants: None  Procedures: None  Antimicrobials: None  DVT prophylaxis: Lovenox    Objective: Vitals:   01/12/24 1015 01/12/24 1145 01/12/24 1222 01/12/24 1525  BP: (!) 152/121 (!) 112/98 (!) 164/99 (!) 153/101  Pulse: 93 84 92 74  Resp: 14 20 19 18   Temp:   98.4 F (36.9 C) 98.1 F (36.7 C)  TempSrc:   Oral Oral  SpO2: 98% 100% 100% 100%  Weight:      Height:        Intake/Output Summary (Last 24 hours) at 01/12/2024 1809 Last data filed at 01/12/2024 1530 Gross per 24 hour  Intake 50 ml  Output --  Net 50 ml   Filed Weights   01/11/24 2037  Weight: 71 kg    Exam: General: NAD  Cardiovascular: S1, S2 present Respiratory: CTAB Abdomen: Soft, nontender, nondistended, bowel sounds present Musculoskeletal: No bilateral pedal edema noted Skin: Normal Psychiatry: Normal mood    Data Reviewed: CBC: Recent Labs  Lab 01/11/24 2042  WBC 5.7  HGB 14.3  HCT 42.2  MCV 98.1  PLT 306   Basic Metabolic Panel: Recent Labs  Lab 01/11/24 2042 01/11/24 2307 01/12/24 0503  NA 133*  --  136  K 3.9  --  3.9  CL 97*  --  103  CO2 18*  --  19*  GLUCOSE 76  --  93  BUN 11  --  9  CREATININE 1.02  --  0.94  CALCIUM  8.7*  --  8.6*  MG  --  2.0  --    GFR: Estimated Creatinine Clearance: 81.5 mL/min (by C-G formula based on SCr of 0.94 mg/dL). Liver Function Tests: Recent Labs  Lab 01/12/24 0503  AST 50*  ALT 26  ALKPHOS 86  BILITOT 0.5  PROT 7.3  ALBUMIN 3.2*   No  results for input(s): LIPASE, AMYLASE in the last 168 hours. No results for input(s): AMMONIA in the last 168 hours. Coagulation Profile: No results for input(s): INR, PROTIME in the last 168 hours. Cardiac Enzymes: No results for input(s): CKTOTAL, CKMB, CKMBINDEX, TROPONINI in the last 168 hours. BNP (last 3 results) No results for input(s): PROBNP in the last 8760 hours. HbA1C: No results for input(s): HGBA1C in the last 72 hours. CBG: No results for input(s): GLUCAP in the last 168 hours. Lipid Profile: No results for input(s): CHOL, HDL, LDLCALC, TRIG, CHOLHDL, LDLDIRECT in the last 72 hours. Thyroid Function Tests: No results for input(s): TSH, T4TOTAL, FREET4, T3FREE, THYROIDAB in the last 72 hours. Anemia Panel: No results for input(s): VITAMINB12, FOLATE, FERRITIN, TIBC, IRON, RETICCTPCT in the last 72 hours. Urine analysis:    Component Value Date/Time   COLORURINE YELLOW 01/02/2024 2235   APPEARANCEUR HAZY (A) 01/02/2024 2235   LABSPEC 1.006 01/02/2024 2235   PHURINE 5.0 01/02/2024 2235   GLUCOSEU NEGATIVE 01/02/2024 2235   HGBUR NEGATIVE 01/02/2024 2235   BILIRUBINUR NEGATIVE 01/02/2024 2235   KETONESUR NEGATIVE 01/02/2024 2235   PROTEINUR NEGATIVE 01/02/2024 2235   UROBILINOGEN 0.2 08/13/2010 2330   NITRITE NEGATIVE 01/02/2024 2235   LEUKOCYTESUR NEGATIVE 01/02/2024 2235   Sepsis Labs: @LABRCNTIP (procalcitonin:4,lacticidven:4)  ) Recent Results (from the past 240 hours)  Respiratory (~20 pathogens) panel by PCR     Status: None   Collection Time: 01/12/24  1:01 PM   Specimen: Nasopharyngeal Swab; Respiratory  Result Value Ref Range Status   Adenovirus NOT DETECTED NOT DETECTED Final   Coronavirus 229E NOT DETECTED NOT DETECTED Final    Comment: (NOTE) The Coronavirus on the Respiratory Panel, DOES NOT test for the novel  Coronavirus (2019 nCoV)    Coronavirus HKU1 NOT DETECTED NOT DETECTED Final    Coronavirus NL63 NOT DETECTED NOT DETECTED Final   Coronavirus OC43 NOT DETECTED NOT DETECTED Final   Metapneumovirus NOT DETECTED NOT DETECTED Final   Rhinovirus / Enterovirus NOT DETECTED NOT DETECTED Final   Influenza A NOT DETECTED NOT DETECTED Final   Influenza B NOT DETECTED NOT DETECTED Final   Parainfluenza Virus 1 NOT DETECTED NOT DETECTED Final   Parainfluenza Virus 2 NOT DETECTED NOT DETECTED Final   Parainfluenza Virus 3 NOT DETECTED NOT DETECTED Final   Parainfluenza Virus 4 NOT DETECTED NOT DETECTED Final   Respiratory Syncytial Virus NOT DETECTED NOT DETECTED Final   Bordetella pertussis NOT DETECTED NOT DETECTED Final   Bordetella Parapertussis NOT DETECTED NOT DETECTED Final   Chlamydophila pneumoniae NOT DETECTED NOT DETECTED Final   Mycoplasma pneumoniae NOT DETECTED NOT DETECTED Final    Comment: Performed at [facility], [address], [place], [place] [address]  Resp panel by RT-PCR (RSV, Flu A&B, Covid) Anterior Nasal Swab     Status: None   Collection Time: 01/12/24  1:01 PM   Specimen: Anterior Nasal Swab  Result Value Ref Range Status   SARS Coronavirus 2 by RT PCR NEGATIVE NEGATIVE Final   Influenza A by PCR NEGATIVE NEGATIVE Final   Influenza B by PCR NEGATIVE NEGATIVE Final    Comment: (NOTE) The Xpert Xpress SARS-CoV-2/FLU/RSV plus assay is intended as an aid in the diagnosis of influenza from Nasopharyngeal swab specimens and should not be used as a sole basis for treatment. Nasal washings and aspirates are unacceptable for Xpert Xpress SARS-CoV-2/FLU/RSV testing.  Fact Sheet for Patients: BloggerCourse.com  Fact Sheet for Healthcare Providers: SeriousBroker.it  This test is not yet approved or cleared by the [place]  FDA and has been authorized for detection and/or diagnosis of SARS-CoV-2 by FDA under an Emergency Use Authorization (EUA). This EUA will remain in effect (meaning this  test can be used) for the duration of the COVID-19 declaration under Section 564(b)(1) of the Act, 21 U.S.C. section 360bbb-3(b)(1), unless the authorization is terminated or revoked.     Resp Syncytial Virus by PCR NEGATIVE NEGATIVE Final    Comment: (NOTE) Fact Sheet for Patients: BloggerCourse.com  Fact Sheet for Healthcare Providers: SeriousBroker.it  This test is not yet approved or cleared by the [place]  FDA and has been authorized for detection and/or diagnosis of SARS-CoV-2 by FDA under an Emergency Use Authorization (EUA). This EUA will remain in effect (meaning this test can be used) for the duration of the COVID-19 declaration under Section 564(b)(1) of the Act, 21 U.S.C. section 360bbb-3(b)(1), unless the authorization is terminated or revoked.  Performed at [facility], [address] [place], [place] [address]   MRSA Next Gen by PCR, Nasal     Status: None   Collection Time: 01/12/24  1:22 PM   Specimen: Nasal Mucosa; Nasal Swab  Result Value Ref Range Status   MRSA by PCR Next Gen NOT DETECTED NOT DETECTED Final    Comment: (NOTE) The GeneXpert MRSA Assay (FDA approved for NASAL specimens only), is one component of a comprehensive MRSA colonization surveillance program. It is not intended to diagnose MRSA infection nor to guide or monitor treatment for MRSA infections. Test performance is not FDA approved in patients less than 58 years old. Performed at [facility], [address] [place], [place] [address]       Studies: ECHOCARDIOGRAM COMPLETE Result Date: 01/12/2024    ECHOCARDIOGRAM REPORT   Patient Name:   Devin [name] Date of Exam: 01/12/2024 Medical Rec #:  [id number]          Height:       69.0 in Accession #:    [id number]         Weight:       156.5 lb Date of Birth:  1961/04/19         BSA:          1.862 m Patient Age:    62 years           BP:           152/121 mmHg  Patient Gender: M                  HR:           79 bpm. Exam Location:  Inpatient Procedure: 2D Echo, Cardiac Doppler and Color Doppler (Both Spectral and Color            Flow Doppler were utilized during procedure). Indications:  Chest Pain R07.9  History:        Patient has no prior history of Echocardiogram examinations.                 Risk Factors:Hypertension and Dyslipidemia.  Sonographer:    Tinnie Gosling RDCS Referring Phys: [id number] [employer] RATHORE  Sonographer Comments: Suboptimal parasternal window. IMPRESSIONS  1. Left ventricular ejection fraction, by estimation, is 65 to 70%. The left ventricle has normal function. Left ventricular endocardial border not optimally defined to evaluate regional wall motion. Left ventricular diastolic parameters were normal.  2. Right ventricular systolic function is normal. The right ventricular size is normal. Tricuspid regurgitation signal is inadequate for assessing PA pressure.  3. The mitral valve is normal in structure. Trivial mitral valve regurgitation. No evidence of mitral stenosis.  4. The aortic valve is grossly normal. Aortic valve regurgitation is not visualized. No aortic stenosis is present.  5. The inferior vena cava is normal in size with greater than 50% respiratory variability, suggesting right atrial pressure of 3 mmHg. FINDINGS  Left Ventricle: Left ventricular ejection fraction, by estimation, is 65 to 70%. The left ventricle has normal function. Left ventricular endocardial border not optimally defined to evaluate regional wall motion. The left ventricular internal cavity size was normal in size. There is borderline left ventricular hypertrophy. Left ventricular diastolic parameters were normal. Right Ventricle: The right ventricular size is normal. No increase in right ventricular wall thickness. Right ventricular systolic function is normal. Tricuspid regurgitation signal is inadequate for assessing PA pressure. Left Atrium: Left atrial  size was normal in size. Right Atrium: Right atrial size was normal in size. Pericardium: There is no evidence of pericardial effusion. Mitral Valve: The mitral valve is normal in structure. Trivial mitral valve regurgitation. No evidence of mitral valve stenosis. Tricuspid Valve: The tricuspid valve is normal in structure. Tricuspid valve regurgitation is trivial. No evidence of tricuspid stenosis. Aortic Valve: The aortic valve is grossly normal. Aortic valve regurgitation is not visualized. No aortic stenosis is present. Pulmonic Valve: The pulmonic valve was not well visualized. Pulmonic valve regurgitation is not visualized. No evidence of pulmonic stenosis. Aorta: The aortic root is normal in size and structure. Venous: The inferior vena cava is normal in size with greater than 50% respiratory variability, suggesting right atrial pressure of 3 mmHg. IAS/Shunts: No atrial level shunt detected by color flow Doppler.  LEFT VENTRICLE PLAX 2D LVIDd:         3.90 cm   Diastology LVIDs:         2.30 cm   LV e' medial:    6.53 cm/s LV PW:         1.00 cm   LV E/e' medial:  9.6 LV IVS:        1.00 cm   LV e' lateral:   8.59 cm/s LVOT diam:     2.10 cm   LV E/e' lateral: 7.3 LV SV:         70 LV SV Index:   38 LVOT Area:     3.46 cm  RIGHT VENTRICLE             IVC RV S prime:     13.60 cm/s  IVC diam: 0.90 cm TAPSE (M-mode): 2.0 cm LEFT ATRIUM             Index        RIGHT ATRIUM           Index LA diam:  2.80 cm 1.50 cm/m   RA Area:     10.20 cm LA Vol (A2C):   32.6 ml 17.51 ml/m  RA Volume:   21.10 ml  11.33 ml/m LA Vol (A4C):   33.1 ml 17.78 ml/m LA Biplane Vol: 34.3 ml 18.42 ml/m  AORTIC VALVE LVOT Vmax:   105.00 cm/s LVOT Vmean:  69.800 cm/s LVOT VTI:    0.203 m  AORTA Ao Root diam: 3.50 cm Ao Asc diam:  3.00 cm MITRAL VALVE MV Area (PHT): 4.04 cm    SHUNTS MV Decel Time: 188 msec    Systemic VTI:  0.20 m MV E velocity: 62.40 cm/s  Systemic Diam: 2.10 cm MV A velocity: 57.60 cm/s MV E/A ratio:  1.08  Soyla Merck MD Electronically signed by Soyla Merck MD Signature Date/Time: 01/12/2024/3:01:09 PM    Final    CT Angio Chest PE W and/or Wo Contrast Result Date: 01/11/2024 CLINICAL DATA:  Chest pain and shortness of breath. EXAM: CT ANGIOGRAPHY CHEST WITH CONTRAST TECHNIQUE: Multidetector CT imaging of the chest was performed using the standard protocol during bolus administration of intravenous contrast. Multiplanar CT image reconstructions and MIPs were obtained to evaluate the vascular anatomy. RADIATION DOSE REDUCTION: This exam was performed according to the departmental dose-optimization program which includes automated exposure control, adjustment of the mA and/or kV according to patient size and/or use of iterative reconstruction technique. CONTRAST:  75mL OMNIPAQUE  IOHEXOL  350 MG/ML SOLN COMPARISON:  CT angiogram chest 12/18/2023. FINDINGS: Cardiovascular: Satisfactory opacification of the pulmonary arteries to the segmental level. No evidence of pulmonary embolism. Normal heart size. No pericardial effusion. Mediastinum/Nodes: No enlarged mediastinal, hilar, or axillary lymph nodes. Thyroid gland, trachea, and esophagus demonstrate no significant findings. Lungs/Pleura: Right pleural thickening and trace effusion appear unchanged from prior examination. There is a band of opacity which is also unchanged from most recent prior. There is a slightly nodular component to this band of opacity measuring 1.8 by 1.0 cm. The lungs are otherwise clear. There is no pneumothorax. Upper Abdomen: No acute abnormality. Musculoskeletal: Degenerative changes affect the spine. Review of the MIP images confirms the above findings. IMPRESSION: 1. No evidence for pulmonary embolism. 2. Stable right pleural thickening and trace effusion. 3. Stable band of opacity in the right lower lobe with a slightly nodular component. This may represent rounded atelectasis. Electronically Signed   By: Greig Pique M.D.   On:  01/11/2024 23:07   DG Chest 2 View Result Date: 01/11/2024 CLINICAL DATA:  Chest pain EXAM: CHEST - 2 VIEW COMPARISON:  01/02/2024, chest CT 12/18/2023 FINDINGS: Low lung. Stable cardiomediastinal silhouette with prominent mediastinum likely augmented by low lung volume and patient rotation.nodular left lower lung opacity probably reflects a nipple shadow. No focal airspace disease, pleural effusion or pneumothorax IMPRESSION: 1. No active cardiopulmonary disease. 2. Nodular left lateral llower lung opacity probably reflects a nipple shadow. Electronically Signed   By: Luke Bun M.D.   On: 01/11/2024 21:04    Scheduled Meds:  dextromethorphan -guaiFENesin   1 tablet Oral BID   enoxaparin  (LOVENOX ) injection  40 mg Subcutaneous Q24H   folic acid   1 mg Oral Daily   lisinopril   20 mg Oral Daily   metoprolol  tartrate  100 mg Oral Daily   multivitamin with minerals  1 tablet Oral Daily   thiamine   100 mg Oral Daily   Or   thiamine   100 mg Intravenous Daily    Continuous Infusions:   LOS: 0 days     Lebron  JINNY Cage, MD [facility]  If 7PM-7AM, please contact night-coverage [email or web address] 01/12/2024, 6:09 PM

## 2024-01-12 NOTE — Progress Notes (Signed)
  Echocardiogram 2D Echocardiogram has been performed.  Tinnie FORBES Gosling RDCS 01/12/2024, 2:37 PM

## 2024-01-12 NOTE — ED Provider Notes (Signed)
 Patient signed out to me at shift change pending consultation with hospitalist for admission.  On my exam, patient remains tachycardic to the 110s.  He states that his chest pain has improved, but comments that he has had several syncopal events recently.  He also states that his blood pressure drops randomly.  His initial troponin is 22, repeat is 23, I have a lower suspicion for ACS, but I do think that it is worthwhile to admit the patient to the hospital to try and sort out his syncopal events.  And this might end up just being alcohol withdrawal, but nevertheless, agree with plan for admission.  I consulted with Dr. Alfornia, who is appreciated for admitting.   Vicky Charleston, PA-C 01/12/24 9986    Bari Charmaine FALCON, MD 01/12/24 2329
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
## 2024-01-13 ENCOUNTER — Observation Stay (HOSPITAL_COMMUNITY)

## 2024-01-13 DIAGNOSIS — R079 Chest pain, unspecified: Secondary | ICD-10-CM | POA: Diagnosis not present

## 2024-01-13 DIAGNOSIS — F1010 Alcohol abuse, uncomplicated: Secondary | ICD-10-CM | POA: Diagnosis not present

## 2024-01-13 LAB — CBC WITH DIFFERENTIAL/PLATELET
Abs Immature Granulocytes: 0.01 K/uL (ref 0.00–0.07)
Basophils Absolute: 0 K/uL (ref 0.0–0.1)
Basophils Relative: 0 %
Eosinophils Absolute: 0.1 K/uL (ref 0.0–0.5)
Eosinophils Relative: 3 %
HCT: 38.2 % — ABNORMAL LOW (ref 39.0–52.0)
Hemoglobin: 12.8 g/dL — ABNORMAL LOW (ref 13.0–17.0)
Immature Granulocytes: 0 %
Lymphocytes Relative: 32 %
Lymphs Abs: 1.4 K/uL (ref 0.7–4.0)
MCH: 33.6 pg (ref 26.0–34.0)
MCHC: 33.5 g/dL (ref 30.0–36.0)
MCV: 100.3 fL — ABNORMAL HIGH (ref 80.0–100.0)
Monocytes Absolute: 0.7 K/uL (ref 0.1–1.0)
Monocytes Relative: 15 %
Neutro Abs: 2.3 K/uL (ref 1.7–7.7)
Neutrophils Relative %: 50 %
Platelets: 229 K/uL (ref 150–400)
RBC: 3.81 MIL/uL — ABNORMAL LOW (ref 4.22–5.81)
RDW: 14.7 % (ref 11.5–15.5)
WBC: 4.6 K/uL (ref 4.0–10.5)
nRBC: 0 % (ref 0.0–0.2)

## 2024-01-13 LAB — COMPREHENSIVE METABOLIC PANEL WITH GFR
ALT: 30 U/L (ref 0–44)
AST: 65 U/L — ABNORMAL HIGH (ref 15–41)
Albumin: 3.2 g/dL — ABNORMAL LOW (ref 3.5–5.0)
Alkaline Phosphatase: 89 U/L (ref 38–126)
Anion gap: 12 (ref 5–15)
BUN: 10 mg/dL (ref 8–23)
CO2: 20 mmol/L — ABNORMAL LOW (ref 22–32)
Calcium: 8.7 mg/dL — ABNORMAL LOW (ref 8.9–10.3)
Chloride: 104 mmol/L (ref 98–111)
Creatinine, Ser: 0.89 mg/dL (ref 0.61–1.24)
GFR, Estimated: >60 mL/min (ref >60)
Glucose, Bld: 80 mg/dL (ref 70–99)
Potassium: 3.8 mmol/L (ref 3.5–5.1)
Sodium: 136 mmol/L (ref 135–145)
Total Bilirubin: 1 mg/dL (ref 0.0–1.2)
Total Protein: 6.8 g/dL (ref 6.5–8.1)

## 2024-01-13 LAB — LIPASE, BLOOD: Lipase: 62 U/L — ABNORMAL HIGH (ref 11–51)

## 2024-01-13 MED ORDER — PANTOPRAZOLE SODIUM 40 MG PO TBEC
40.0000 mg | DELAYED_RELEASE_TABLET | Freq: Every day | ORAL | Status: DC
  Administered 2024-01-13 – 2024-01-15 (×3): 40 mg via ORAL
  Filled 2024-01-13 (×3): qty 1

## 2024-01-13 NOTE — Progress Notes (Signed)
 PROGRESS NOTE  ABDULMALIK DARCO [name] FMW:[id number] DOB: 1961/03/29 DOA: 01/11/2024 PCP: Pcp, No  HPI/Recap of past 24 hours: Devin [name] is a 63 y.o. male with medical history significant of alcohol use disorder, alcoholic liver disease, polysubstance abuse, chronic back pain, depression, esophagitis, GERD, gout, hypertension, hyperlipidemia, avascular necrosis. Recent hospital admission 9/3-9/5 for AKI and electrolyte abnormalities in the setting of alcohol abuse and intoxication. Patient presented c/o right sided/central chest pain associated with dyspnea, palpitations, and near syncope for the past few days. PTA, had 24 ounces of beer after which he started having symptoms, which prompted him to come into the ED to be evaluated. In the ED, pt noted to be tachycardic, with uncontrolled HTN, labs showed troponin 22> 23.  EKG without acute ischemic changes.  CTA chest negative for PE. Patient admitted for further management.    Today, patient reports some generalized abdominal pain, nausea, diarrhea and some cough.  BP uncontrolled.  Possibly withdrawal.   Assessment/Plan: Principal Problem:   Chest pain Active Problems:   Essential hypertension   Alcohol use disorder   High anion gap metabolic acidosis   Hyponatremia   Right-sided chest pain, palpitations, dyspnea, near syncope In the setting of ongoing alcohol abuse, cocaine abuse EKG showing sinus tachycardia, diffuse T wave inversions similar to prior EKGs dating back to 12/17/2023 Troponin 22 > 23, not consistent with ACS CTA chest not suggestive of PE, pneumonia, pneumothorax, or aortic dissection Echocardiogram showed EF of 65 to 70%, unable to optimally define regional wall motion abnormality Telemetry  Abdominal pain/nausea/diarrhea Possibly withdrawal Afebrile, with no leukocytosis AST mildly elevated Lipase pending RUQ pending Daily CMP   Alcohol use disorder Polysubstance abuse At high risk for  withdrawal UDS positive for cocaine and marijuana S/p IV fluids Placed on CIWA protocol; Ativan  as needed Thiamine , folate, and multivitamin.   Hypertension Uncontrolled, likely from withdrawal  Continue lisinopril  and metoprolol  IV hydralazine  as needed    Estimated body mass index is 23.11 kg/m as calculated from the following:   Height as of this encounter: 5' 9 (1.753 m).   Weight as of this encounter: 71 kg.     Code Status: Full  Family Communication: None at bedside  Disposition Plan: Status is: Observation The patient remains OBS appropriate and will d/c before 2 midnights.      Consultants: None  Procedures: None  Antimicrobials: None  DVT prophylaxis: Lovenox    Objective: Vitals:   01/13/24 0350 01/13/24 0711 01/13/24 1127 01/13/24 1601  BP: (!) 155/102 (!) 154/106 (!) 133/111 (!) 139/95  Pulse: 81     Resp: 16 14 17    Temp: 97.6 F (36.4 C) 97.6 F (36.4 C) 97.6 F (36.4 C) 98.6 F (37 C)  TempSrc: Oral Oral Oral Oral  SpO2: 98% 100%    Weight:      Height:        Intake/Output Summary (Last 24 hours) at 01/13/2024 1618 Last data filed at 01/13/2024 1500 Gross per 24 hour  Intake 840 ml  Output 500 ml  Net 340 ml   Filed Weights   01/11/24 2037  Weight: 71 kg    Exam: General: NAD  Cardiovascular: S1, S2 present Respiratory: CTAB Abdomen: Soft, tender, nondistended, bowel sounds present Musculoskeletal: No bilateral pedal edema noted Skin: Normal Psychiatry: Normal mood    Data Reviewed: CBC: Recent Labs  Lab 01/11/24 2042 01/13/24 0139  WBC 5.7 4.6  NEUTROABS  --  2.3  HGB 14.3 12.8*  HCT  42.2 38.2*  MCV 98.1 100.3*  PLT 306 229   Basic Metabolic Panel: Recent Labs  Lab 01/11/24 2042 01/11/24 2307 01/12/24 0503 01/13/24 0139  NA 133*  --  136 136  K 3.9  --  3.9 3.8  CL 97*  --  103 104  CO2 18*  --  19* 20*  GLUCOSE 76  --  93 80  BUN 11  --  9 10  CREATININE 1.02  --  0.94 0.89  CALCIUM  8.7*  --   8.6* 8.7*  MG  --  2.0  --   --    GFR: Estimated Creatinine Clearance: 86.1 mL/min (by C-G formula based on SCr of 0.89 mg/dL). Liver Function Tests: Recent Labs  Lab 01/12/24 0503 01/13/24 0139  AST 50* 65*  ALT 26 30  ALKPHOS 86 89  BILITOT 0.5 1.0  PROT 7.3 6.8  ALBUMIN 3.2* 3.2*   No results for input(s): LIPASE, AMYLASE in the last 168 hours. No results for input(s): AMMONIA in the last 168 hours. Coagulation Profile: No results for input(s): INR, PROTIME in the last 168 hours. Cardiac Enzymes: No results for input(s): CKTOTAL, CKMB, CKMBINDEX, TROPONINI in the last 168 hours. BNP (last 3 results) No results for input(s): PROBNP in the last 8760 hours. HbA1C: No results for input(s): HGBA1C in the last 72 hours. CBG: No results for input(s): GLUCAP in the last 168 hours. Lipid Profile: No results for input(s): CHOL, HDL, LDLCALC, TRIG, CHOLHDL, LDLDIRECT in the last 72 hours. Thyroid Function Tests: No results for input(s): TSH, T4TOTAL, FREET4, T3FREE, THYROIDAB in the last 72 hours. Anemia Panel: No results for input(s): VITAMINB12, FOLATE, FERRITIN, TIBC, IRON, RETICCTPCT in the last 72 hours. Urine analysis:    Component Value Date/Time   COLORURINE YELLOW 01/02/2024 2235   APPEARANCEUR HAZY (A) 01/02/2024 2235   LABSPEC 1.006 01/02/2024 2235   PHURINE 5.0 01/02/2024 2235   GLUCOSEU NEGATIVE 01/02/2024 2235   HGBUR NEGATIVE 01/02/2024 2235   BILIRUBINUR NEGATIVE 01/02/2024 2235   KETONESUR NEGATIVE 01/02/2024 2235   PROTEINUR NEGATIVE 01/02/2024 2235   UROBILINOGEN 0.2 08/13/2010 2330   NITRITE NEGATIVE 01/02/2024 2235   LEUKOCYTESUR NEGATIVE 01/02/2024 2235   Sepsis Labs: @LABRCNTIP (procalcitonin:4,lacticidven:4)  ) Recent Results (from the past 240 hours)  Respiratory (~20 pathogens) panel by PCR     Status: None   Collection Time: 01/12/24  1:01 PM   Specimen: Nasopharyngeal Swab;  Respiratory  Result Value Ref Range Status   Adenovirus NOT DETECTED NOT DETECTED Final   Coronavirus 229E NOT DETECTED NOT DETECTED Final    Comment: (NOTE) The Coronavirus on the Respiratory Panel, DOES NOT test for the novel  Coronavirus (2019 nCoV)    Coronavirus HKU1 NOT DETECTED NOT DETECTED Final   Coronavirus NL63 NOT DETECTED NOT DETECTED Final   Coronavirus OC43 NOT DETECTED NOT DETECTED Final   Metapneumovirus NOT DETECTED NOT DETECTED Final   Rhinovirus / Enterovirus NOT DETECTED NOT DETECTED Final   Influenza A NOT DETECTED NOT DETECTED Final   Influenza B NOT DETECTED NOT DETECTED Final   Parainfluenza Virus 1 NOT DETECTED NOT DETECTED Final   Parainfluenza Virus 2 NOT DETECTED NOT DETECTED Final   Parainfluenza Virus 3 NOT DETECTED NOT DETECTED Final   Parainfluenza Virus 4 NOT DETECTED NOT DETECTED Final   Respiratory Syncytial Virus NOT DETECTED NOT DETECTED Final   Bordetella pertussis NOT DETECTED NOT DETECTED Final   Bordetella Parapertussis NOT DETECTED NOT DETECTED Final   Chlamydophila pneumoniae NOT DETECTED NOT  DETECTED Final   Mycoplasma pneumoniae NOT DETECTED NOT DETECTED Final    Comment: Performed at [facility], [address], [place], [place] [address]  Resp panel by RT-PCR (RSV, Flu A&B, Covid) Anterior Nasal Swab     Status: None   Collection Time: 01/12/24  1:01 PM   Specimen: Anterior Nasal Swab  Result Value Ref Range Status   SARS Coronavirus 2 by RT PCR NEGATIVE NEGATIVE Final   Influenza A by PCR NEGATIVE NEGATIVE Final   Influenza B by PCR NEGATIVE NEGATIVE Final    Comment: (NOTE) The Xpert Xpress SARS-CoV-2/FLU/RSV plus assay is intended as an aid in the diagnosis of influenza from Nasopharyngeal swab specimens and should not be used as a sole basis for treatment. Nasal washings and aspirates are unacceptable for Xpert Xpress SARS-CoV-2/FLU/RSV testing.  Fact Sheet for  Patients: BloggerCourse.com  Fact Sheet for Healthcare Providers: SeriousBroker.it  This test is not yet approved or cleared by the [place]  FDA and has been authorized for detection and/or diagnosis of SARS-CoV-2 by FDA under an Emergency Use Authorization (EUA). This EUA will remain in effect (meaning this test can be used) for the duration of the COVID-19 declaration under Section 564(b)(1) of the Act, 21 U.S.C. section 360bbb-3(b)(1), unless the authorization is terminated or revoked.     Resp Syncytial Virus by PCR NEGATIVE NEGATIVE Final    Comment: (NOTE) Fact Sheet for Patients: BloggerCourse.com  Fact Sheet for Healthcare Providers: SeriousBroker.it  This test is not yet approved or cleared by the [place]  FDA and has been authorized for detection and/or diagnosis of SARS-CoV-2 by FDA under an Emergency Use Authorization (EUA). This EUA will remain in effect (meaning this test can be used) for the duration of the COVID-19 declaration under Section 564(b)(1) of the Act, 21 U.S.C. section 360bbb-3(b)(1), unless the authorization is terminated or revoked.  Performed at [facility], [address], [place], [place] [address]   MRSA Next Gen by PCR, Nasal     Status: None   Collection Time: 01/12/24  1:22 PM   Specimen: Nasal Mucosa; Nasal Swab  Result Value Ref Range Status   MRSA by PCR Next Gen NOT DETECTED NOT DETECTED Final    Comment: (NOTE) The GeneXpert MRSA Assay (FDA approved for NASAL specimens only), is one component of a comprehensive MRSA colonization surveillance program. It is not intended to diagnose MRSA infection nor to guide or monitor treatment for MRSA infections. Test performance is not FDA approved in patients less than 52 years old. Performed at [facility], [address] [place], [place] [address]        Studies: No results found.   Scheduled Meds:  dextromethorphan -guaiFENesin   1 tablet Oral BID   enoxaparin  (LOVENOX ) injection  40 mg Subcutaneous Q24H   folic acid   1 mg Oral Daily   lisinopril   20 mg Oral Daily   metoprolol  tartrate  100 mg Oral Daily   multivitamin with minerals  1 tablet Oral Daily   pantoprazole   40 mg Oral Daily   thiamine   100 mg Oral Daily   Or   thiamine   100 mg Intravenous Daily    Continuous Infusions:   LOS: 0 days     Lebron JINNY Cage, MD [facility]  If 7PM-7AM, please contact night-coverage [email or web address] 01/13/2024, 4:18 PM

## 2024-01-13 NOTE — Plan of Care (Signed)

## 2024-01-13 NOTE — Plan of Care (Signed)

## 2024-01-14 DIAGNOSIS — R079 Chest pain, unspecified: Secondary | ICD-10-CM | POA: Diagnosis not present

## 2024-01-14 LAB — CBC WITH DIFFERENTIAL/PLATELET
Abs Immature Granulocytes: 0.01 K/uL (ref 0.00–0.07)
Basophils Absolute: 0 K/uL (ref 0.0–0.1)
Basophils Relative: 0 %
Eosinophils Absolute: 0.1 K/uL (ref 0.0–0.5)
Eosinophils Relative: 3 %
HCT: 39.5 % (ref 39.0–52.0)
Hemoglobin: 13.1 g/dL (ref 13.0–17.0)
Immature Granulocytes: 0 %
Lymphocytes Relative: 35 %
Lymphs Abs: 1.5 K/uL (ref 0.7–4.0)
MCH: 33.5 pg (ref 26.0–34.0)
MCHC: 33.2 g/dL (ref 30.0–36.0)
MCV: 101 fL — ABNORMAL HIGH (ref 80.0–100.0)
Monocytes Absolute: 0.7 K/uL (ref 0.1–1.0)
Monocytes Relative: 15 %
Neutro Abs: 2.1 K/uL (ref 1.7–7.7)
Neutrophils Relative %: 47 %
Platelets: 235 K/uL (ref 150–400)
RBC: 3.91 MIL/uL — ABNORMAL LOW (ref 4.22–5.81)
RDW: 14.5 % (ref 11.5–15.5)
WBC: 4.4 K/uL (ref 4.0–10.5)
nRBC: 0.9 % — ABNORMAL HIGH (ref 0.0–0.2)

## 2024-01-14 LAB — COMPREHENSIVE METABOLIC PANEL WITH GFR
ALT: 30 U/L (ref 0–44)
AST: 49 U/L — ABNORMAL HIGH (ref 15–41)
Albumin: 3.2 g/dL — ABNORMAL LOW (ref 3.5–5.0)
Alkaline Phosphatase: 91 U/L (ref 38–126)
Anion gap: 7 (ref 5–15)
BUN: 9 mg/dL (ref 8–23)
CO2: 24 mmol/L (ref 22–32)
Calcium: 8.9 mg/dL (ref 8.9–10.3)
Chloride: 102 mmol/L (ref 98–111)
Creatinine, Ser: 1.22 mg/dL (ref 0.61–1.24)
GFR, Estimated: >60 mL/min (ref >60)
Glucose, Bld: 121 mg/dL — ABNORMAL HIGH (ref 70–99)
Potassium: 3.7 mmol/L (ref 3.5–5.1)
Sodium: 133 mmol/L — ABNORMAL LOW (ref 135–145)
Total Bilirubin: 0.7 mg/dL (ref 0.0–1.2)
Total Protein: 6.9 g/dL (ref 6.5–8.1)

## 2024-01-14 MED ORDER — AMLODIPINE BESYLATE 10 MG PO TABS
10.0000 mg | ORAL_TABLET | Freq: Every day | ORAL | Status: DC
  Administered 2024-01-15: 10 mg via ORAL
  Filled 2024-01-14: qty 1

## 2024-01-14 MED ORDER — AMLODIPINE BESYLATE 10 MG PO TABS: 10.0000 mg | ORAL_TABLET | Freq: Every day | ORAL | Status: DC

## 2024-01-14 MED ORDER — BENZONATATE 100 MG PO CAPS
200.0000 mg | ORAL_CAPSULE | Freq: Three times a day (TID) | ORAL | Status: DC | PRN
  Administered 2024-01-14 – 2024-01-15 (×2): 200 mg via ORAL
  Filled 2024-01-14 (×2): qty 2

## 2024-01-14 NOTE — TOC CM/SW Note (Signed)
 Transition of Care [facility]) - Inpatient Brief Assessment   Patient Details  Name: Devin [name] MRN: [id number] Date of Birth: 01-25-1961  Transition of Care [facility]) CM/SW Contact:    Lauraine FORBES Saa, LCSWA Phone Number: 01/14/2024, 11:56 AM   Clinical Narrative:  11:57 AM Per chart review, patient resides at home with friends. Patient has insurance but does not have a PCP. Patient does not have SNF or HH history. Patient has DME (cane) history with [employer]. Patient's preferred pharmacy's are [facility], [facility] [phone] [place], and [facility] [phone] [place]. CSW provided patient SDOH (transportation) resources. Patient declined CSW offer of substance use resources. TOC will continue to follow and be available to assist.  Transition of Care Asessment: Insurance and Status: Insurance coverage has been reviewed Patient has primary care physician: No Home environment has been reviewed: Private Residence Prior level of function:: N/A Prior/Current Home Services: No current home services Social Drivers of Health Review: SDOH reviewed interventions complete Readmission risk has been reviewed: Yes (Currently Observation Status) Transition of care needs: transition of care needs identified, TOC will continue to follow

## 2024-01-14 NOTE — Progress Notes (Signed)
 PROGRESS NOTE  Devin [name] FMW:[id number] DOB: [birth year] DOA: 01/11/2024 PCP: Pcp, No  HPI/Recap of past 24 hours: Fairy VEAR Devin [name] is a 63 y.o. male with medical history significant of alcohol use disorder, alcoholic liver disease, polysubstance abuse, chronic back pain, depression, esophagitis, GERD, gout, hypertension, hyperlipidemia, avascular necrosis. Recent hospital admission 9/3-9/5 for AKI and electrolyte abnormalities in the setting of alcohol abuse and intoxication. Patient presented c/o right sided/central chest pain associated with dyspnea, palpitations, and near syncope for the past few days. PTA, had 24 ounces of beer after which he started having symptoms, which prompted him to come into the ED to be evaluated. In the ED, pt noted to be tachycardic, with uncontrolled HTN, labs showed troponin 22> 23.  EKG without acute ischemic changes.  CTA chest negative for PE. Patient admitted for further management.    Today, patient keeps complaining of persistent dry cough, some nausea, mild generalized abdominal pain.   Assessment/Plan: Principal Problem:   Chest pain Active Problems:   Essential hypertension   Alcohol use disorder   High anion gap metabolic acidosis   Hyponatremia   Right-sided chest pain, palpitations, dyspnea, near syncope In the setting of ongoing alcohol abuse, cocaine abuse EKG showing sinus tachycardia, diffuse T wave inversions similar to prior EKGs dating back to 12/17/2023 Troponin 22 > 23, not consistent with ACS CTA chest not suggestive of PE, pneumonia, pneumothorax, or aortic dissection Echocardiogram showed EF of 65 to 70%, unable to optimally define regional wall motion abnormality Telemetry  Abdominal pain/nausea/diarrhea Possibly withdrawal Afebrile, with no leukocytosis AST mildly elevated Lipase 62 RUQ USS showed no acute findings, nonspecific findings possible hepatic steatosis or cirrhosis Daily CMP   Alcohol use  disorder Polysubstance abuse At high risk for withdrawal UDS positive for cocaine and marijuana S/p IV fluids Placed on CIWA protocol; Ativan  as needed Thiamine , folate, and multivitamin.  Possibly cough induced ACE inhibitor Hold PTA lisinopril  (plan to discontinue) Monitor closely, cough suppressant   Hypertension Uncontrolled Held lisinopril , continue metoprolol , started on amlodipine  IV hydralazine  as needed    Estimated body mass index is 23.11 kg/m as calculated from the following:   Height as of this encounter: 5' 9 (1.753 m).   Weight as of this encounter: 71 kg.     Code Status: Full  Family Communication: None at bedside  Disposition Plan: Status is: Observation The patient remains OBS appropriate and will d/c before 2 midnights.      Consultants: None  Procedures: None  Antimicrobials: None  DVT prophylaxis: Lovenox    Objective: Vitals:   01/14/24 0438 01/14/24 0842 01/14/24 1244 01/14/24 1558  BP: 112/78 (!) 139/99 (!) 132/97 (!) 114/94  Pulse: 93 100 72 71  Resp: 18 18 16 15   Temp: 97.9 F (36.6 C) 97.8 F (36.6 C) 97.9 F (36.6 C) 98.2 F (36.8 C)  TempSrc: Oral Oral Oral Oral  SpO2: 99% 99% 100% 95%  Weight:      Height:        Intake/Output Summary (Last 24 hours) at 01/14/2024 1835 Last data filed at 01/14/2024 1601 Gross per 24 hour  Intake 720 ml  Output 700 ml  Net 20 ml   Filed Weights   01/11/24 2037  Weight: 71 kg    Exam: General: NAD  Cardiovascular: S1, S2 present Respiratory: CTAB Abdomen: Soft, tender, nondistended, bowel sounds present Musculoskeletal: No bilateral pedal edema noted Skin: Normal Psychiatry: Normal mood    Data Reviewed: CBC: Recent Labs  Lab 01/11/24 2042 01/13/24 0139 01/14/24 0206  WBC 5.7 4.6 4.4  NEUTROABS  --  2.3 2.1  HGB 14.3 12.8* 13.1  HCT 42.2 38.2* 39.5  MCV 98.1 100.3* 101.0*  PLT 306 229 235   Basic Metabolic Panel: Recent Labs  Lab 01/11/24 2042  01/11/24 2307 01/12/24 0503 01/13/24 0139 01/14/24 0206  NA 133*  --  136 136 133*  K 3.9  --  3.9 3.8 3.7  CL 97*  --  103 104 102  CO2 18*  --  19* 20* 24  GLUCOSE 76  --  93 80 121*  BUN 11  --  9 10 9   CREATININE 1.02  --  0.94 0.89 1.22  CALCIUM  8.7*  --  8.6* 8.7* 8.9  MG  --  2.0  --   --   --    GFR: Estimated Creatinine Clearance: 62.8 mL/min (by C-G formula based on SCr of 1.22 mg/dL). Liver Function Tests: Recent Labs  Lab 01/12/24 0503 01/13/24 0139 01/14/24 0206  AST 50* 65* 49*  ALT 26 30 30   ALKPHOS 86 89 91  BILITOT 0.5 1.0 0.7  PROT 7.3 6.8 6.9  ALBUMIN 3.2* 3.2* 3.2*   Recent Labs  Lab 01/13/24 0139  LIPASE 62*   No results for input(s): AMMONIA in the last 168 hours. Coagulation Profile: No results for input(s): INR, PROTIME in the last 168 hours. Cardiac Enzymes: No results for input(s): CKTOTAL, CKMB, CKMBINDEX, TROPONINI in the last 168 hours. BNP (last 3 results) No results for input(s): PROBNP in the last 8760 hours. HbA1C: No results for input(s): HGBA1C in the last 72 hours. CBG: No results for input(s): GLUCAP in the last 168 hours. Lipid Profile: No results for input(s): CHOL, HDL, LDLCALC, TRIG, CHOLHDL, LDLDIRECT in the last 72 hours. Thyroid Function Tests: No results for input(s): TSH, T4TOTAL, FREET4, T3FREE, THYROIDAB in the last 72 hours. Anemia Panel: No results for input(s): VITAMINB12, FOLATE, FERRITIN, TIBC, IRON, RETICCTPCT in the last 72 hours. Urine analysis:    Component Value Date/Time   COLORURINE YELLOW 01/02/2024 2235   APPEARANCEUR HAZY (A) 01/02/2024 2235   LABSPEC 1.006 01/02/2024 2235   PHURINE 5.0 01/02/2024 2235   GLUCOSEU NEGATIVE 01/02/2024 2235   HGBUR NEGATIVE 01/02/2024 2235   BILIRUBINUR NEGATIVE 01/02/2024 2235   KETONESUR NEGATIVE 01/02/2024 2235   PROTEINUR NEGATIVE 01/02/2024 2235   UROBILINOGEN 0.2 08/13/2010 2330   NITRITE NEGATIVE  01/02/2024 2235   LEUKOCYTESUR NEGATIVE 01/02/2024 2235   Sepsis Labs: @LABRCNTIP (procalcitonin:4,lacticidven:4)  ) Recent Results (from the past 240 hours)  Respiratory (~20 pathogens) panel by PCR     Status: None   Collection Time: 01/12/24  1:01 PM   Specimen: Nasopharyngeal Swab; Respiratory  Result Value Ref Range Status   Adenovirus NOT DETECTED NOT DETECTED Final   Coronavirus 229E NOT DETECTED NOT DETECTED Final    Comment: (NOTE) The Coronavirus on the Respiratory Panel, DOES NOT test for the novel  Coronavirus (2019 nCoV)    Coronavirus HKU1 NOT DETECTED NOT DETECTED Final   Coronavirus NL63 NOT DETECTED NOT DETECTED Final   Coronavirus OC43 NOT DETECTED NOT DETECTED Final   Metapneumovirus NOT DETECTED NOT DETECTED Final   Rhinovirus / Enterovirus NOT DETECTED NOT DETECTED Final   Influenza A NOT DETECTED NOT DETECTED Final   Influenza B NOT DETECTED NOT DETECTED Final   Parainfluenza Virus 1 NOT DETECTED NOT DETECTED Final   Parainfluenza Virus 2 NOT DETECTED NOT DETECTED Final   Parainfluenza Virus 3  NOT DETECTED NOT DETECTED Final   Parainfluenza Virus 4 NOT DETECTED NOT DETECTED Final   Respiratory Syncytial Virus NOT DETECTED NOT DETECTED Final   Bordetella pertussis NOT DETECTED NOT DETECTED Final   Bordetella Parapertussis NOT DETECTED NOT DETECTED Final   Chlamydophila pneumoniae NOT DETECTED NOT DETECTED Final   Mycoplasma pneumoniae NOT DETECTED NOT DETECTED Final    Comment: Performed at [facility], [address], [place], [place] [address]  Resp panel by RT-PCR (RSV, Flu A&B, Covid) Anterior Nasal Swab     Status: None   Collection Time: 01/12/24  1:01 PM   Specimen: Anterior Nasal Swab  Result Value Ref Range Status   SARS Coronavirus 2 by RT PCR NEGATIVE NEGATIVE Final   Influenza A by PCR NEGATIVE NEGATIVE Final   Influenza B by PCR NEGATIVE NEGATIVE Final    Comment: (NOTE) The Xpert Xpress SARS-CoV-2/FLU/RSV plus assay is intended as an  aid in the diagnosis of influenza from Nasopharyngeal swab specimens and should not be used as a sole basis for treatment. Nasal washings and aspirates are unacceptable for Xpert Xpress SARS-CoV-2/FLU/RSV testing.  Fact Sheet for Patients: BloggerCourse.com  Fact Sheet for Healthcare Providers: SeriousBroker.it  This test is not yet approved or cleared by the [place]  FDA and has been authorized for detection and/or diagnosis of SARS-CoV-2 by FDA under an Emergency Use Authorization (EUA). This EUA will remain in effect (meaning this test can be used) for the duration of the COVID-19 declaration under Section 564(b)(1) of the Act, 21 U.S.C. section 360bbb-3(b)(1), unless the authorization is terminated or revoked.     Resp Syncytial Virus by PCR NEGATIVE NEGATIVE Final    Comment: (NOTE) Fact Sheet for Patients: BloggerCourse.com  Fact Sheet for Healthcare Providers: SeriousBroker.it  This test is not yet approved or cleared by the [place]  FDA and has been authorized for detection and/or diagnosis of SARS-CoV-2 by FDA under an Emergency Use Authorization (EUA). This EUA will remain in effect (meaning this test can be used) for the duration of the COVID-19 declaration under Section 564(b)(1) of the Act, 21 U.S.C. section 360bbb-3(b)(1), unless the authorization is terminated or revoked.  Performed at [facility], [address], [place], [place] [address]   MRSA Next Gen by PCR, Nasal     Status: None   Collection Time: 01/12/24  1:22 PM   Specimen: Nasal Mucosa; Nasal Swab  Result Value Ref Range Status   MRSA by PCR Next Gen NOT DETECTED NOT DETECTED Final    Comment: (NOTE) The GeneXpert MRSA Assay (FDA approved for NASAL specimens only), is one component of a comprehensive MRSA colonization surveillance program. It is not intended to diagnose MRSA  infection nor to guide or monitor treatment for MRSA infections. Test performance is not FDA approved in patients less than 74 years old. Performed at [facility], [address] [place], [place] [address]       Studies: No results found.   Scheduled Meds:  [START ON 01/15/2024] amLODipine   10 mg Oral Daily   dextromethorphan -guaiFENesin   1 tablet Oral BID   enoxaparin  (LOVENOX ) injection  40 mg Subcutaneous Q24H   folic acid   1 mg Oral Daily   metoprolol  tartrate  100 mg Oral Daily   multivitamin with minerals  1 tablet Oral Daily   pantoprazole   40 mg Oral Daily   thiamine   100 mg Oral Daily   Or   thiamine   100 mg Intravenous Daily  Continuous Infusions:   LOS: 0 days     Lebron JINNY Cage, MD [facility]  If 7PM-7AM, please contact night-coverage [email or web address] 01/14/2024, 6:35 PM

## 2024-01-14 NOTE — Plan of Care (Signed)

## 2024-01-15 ENCOUNTER — Observation Stay (HOSPITAL_COMMUNITY)

## 2024-01-15 ENCOUNTER — Other Ambulatory Visit (HOSPITAL_COMMUNITY): Payer: Self-pay

## 2024-01-15 DIAGNOSIS — R0789 Other chest pain: Secondary | ICD-10-CM

## 2024-01-15 DIAGNOSIS — F1090 Alcohol use, unspecified, uncomplicated: Secondary | ICD-10-CM | POA: Diagnosis not present

## 2024-01-15 LAB — CBC WITH DIFFERENTIAL/PLATELET
Abs Immature Granulocytes: 0.02 K/uL (ref 0.00–0.07)
Basophils Absolute: 0 K/uL (ref 0.0–0.1)
Basophils Relative: 0 %
Eosinophils Absolute: 0.2 K/uL (ref 0.0–0.5)
Eosinophils Relative: 3 %
HCT: 39.7 % (ref 39.0–52.0)
Hemoglobin: 13.2 g/dL (ref 13.0–17.0)
Immature Granulocytes: 0 %
Lymphocytes Relative: 33 %
Lymphs Abs: 1.9 K/uL (ref 0.7–4.0)
MCH: 33.6 pg (ref 26.0–34.0)
MCHC: 33.2 g/dL (ref 30.0–36.0)
MCV: 101 fL — ABNORMAL HIGH (ref 80.0–100.0)
Monocytes Absolute: 0.8 K/uL (ref 0.1–1.0)
Monocytes Relative: 14 %
Neutro Abs: 2.9 K/uL (ref 1.7–7.7)
Neutrophils Relative %: 50 %
Platelets: 258 K/uL (ref 150–400)
RBC: 3.93 MIL/uL — ABNORMAL LOW (ref 4.22–5.81)
RDW: 14.4 % (ref 11.5–15.5)
WBC: 5.9 K/uL (ref 4.0–10.5)
nRBC: 0.3 % — ABNORMAL HIGH (ref 0.0–0.2)

## 2024-01-15 LAB — COMPREHENSIVE METABOLIC PANEL WITH GFR
ALT: 31 U/L (ref 0–44)
AST: 46 U/L — ABNORMAL HIGH (ref 15–41)
Albumin: 3.3 g/dL — ABNORMAL LOW (ref 3.5–5.0)
Alkaline Phosphatase: 98 U/L (ref 38–126)
Anion gap: 9 (ref 5–15)
BUN: 15 mg/dL (ref 8–23)
CO2: 22 mmol/L (ref 22–32)
Calcium: 9.7 mg/dL (ref 8.9–10.3)
Chloride: 104 mmol/L (ref 98–111)
Creatinine, Ser: 1.28 mg/dL — ABNORMAL HIGH (ref 0.61–1.24)
GFR, Estimated: >60 mL/min (ref >60)
Glucose, Bld: 93 mg/dL (ref 70–99)
Potassium: 4.2 mmol/L (ref 3.5–5.1)
Sodium: 135 mmol/L (ref 135–145)
Total Bilirubin: 0.2 mg/dL (ref 0.0–1.2)
Total Protein: 6.9 g/dL (ref 6.5–8.1)

## 2024-01-15 MED ORDER — PANTOPRAZOLE SODIUM 40 MG PO TBEC
40.0000 mg | DELAYED_RELEASE_TABLET | Freq: Every day | ORAL | 0 refills | Status: DC
  Filled 2024-01-15: qty 30, 30d supply, fill #0

## 2024-01-15 MED ORDER — AMLODIPINE BESYLATE 10 MG PO TABS
10.0000 mg | ORAL_TABLET | Freq: Every day | ORAL | 0 refills | Status: DC
  Filled 2024-01-15: qty 30, 30d supply, fill #0

## 2024-01-15 MED ORDER — ONDANSETRON 4 MG PO TBDP
4.0000 mg | ORAL_TABLET | Freq: Three times a day (TID) | ORAL | 0 refills | Status: AC | PRN
  Filled 2024-01-15: qty 20, 7d supply, fill #0

## 2024-01-15 MED ORDER — DM-GUAIFENESIN ER 30-600 MG PO TB12
1.0000 | ORAL_TABLET | Freq: Two times a day (BID) | ORAL | 0 refills | Status: AC | PRN
  Filled 2024-01-15: qty 10, 5d supply, fill #0

## 2024-01-15 NOTE — Discharge Summary (Signed)
 Physician Discharge Summary   Patient: Devin [name] MRN: [id number] DOB: 19-Feb-1961  Admit date:     01/11/2024  Discharge date: 01/15/24  Discharge Physician: Lebron JINNY Cage   PCP: Pcp, No   Recommendations at discharge:   Follow-up with PCP  Discharge Diagnoses: Principal Problem:   Chest pain Active Problems:   Essential hypertension   Alcohol use disorder   High anion gap metabolic acidosis   Hyponatremia    Hospital Course: [name] is a 63 y.o. male with medical history significant of alcohol use disorder, alcoholic liver disease, polysubstance abuse, chronic back pain, depression, esophagitis, GERD, gout, hypertension, hyperlipidemia, avascular necrosis. Recent hospital admission 9/3-9/5 for AKI and electrolyte abnormalities in the setting of alcohol abuse and intoxication. Patient presented c/o right sided/central chest pain associated with dyspnea, palpitations, and near syncope for the past few days. PTA, had 24 ounces of beer after which he started having symptoms, which prompted him to come into the ED to be evaluated. In the ED, pt noted to be tachycardic, with uncontrolled HTN, labs showed troponin 22> 23.  EKG without acute ischemic changes.  CTA chest negative for PE. Patient admitted for further management.     Today, patient denies any new complaints, denies any chest pain, shortness of breath, worsening abdominal pain, nausea/vomiting, fever/chills.  Patient ambulated the hallway without any issues.  Advised patient to discontinue lisinopril  due to noted chronic cough and start amlodipine .  Advised patient to completely quit drinking alcohol    Assessment and Plan:  Right-sided chest pain, palpitations, dyspnea, near syncope Resolved In the setting of ongoing alcohol abuse, cocaine abuse EKG showing sinus tachycardia, diffuse T wave inversions similar to prior EKGs dating back to 12/17/2023 Troponin 22 > 23, not consistent with ACS CTA  chest not suggestive of PE, pneumonia, pneumothorax, or aortic dissection Echocardiogram showed EF of 65 to 70%, unable to optimally define regional wall motion abnormality   Abdominal pain/nausea/diarrhea Improved Possibly withdrawal Afebrile, with no leukocytosis AST mildly elevated Lipase 62 RUQ USS showed no acute findings, nonspecific findings possible hepatic steatosis or cirrhosis CT abdomen/pelvis unremarkable   Alcohol use disorder Polysubstance abuse Noted possible withdrawal UDS positive for cocaine and marijuana S/p IV fluids Placed on CIWA protocol; Ativan  as needed Thiamine , folate, and multivitamin Advised to quit   Possibly cough induced ACE inhibitor Discontinue PTA lisinopril    Hypertension Better controlled Discontinue lisinopril , continue metoprolol , started on amlodipine        Pain control - [place]  Controlled Substance Reporting System database was reviewed. and patient was instructed, not to drive, operate heavy machinery, perform activities at heights, swimming or participation in water  activities or provide baby-sitting services while on Pain, Sleep and Anxiety Medications; until their outpatient Physician has advised to do so again. Also recommended to not to take more than prescribed Pain, Sleep and Anxiety Medications.    Consultants: None Procedures performed: None Disposition: Home Diet recommendation:  Cardiac diet   DISCHARGE MEDICATION: Allergies as of 01/15/2024   No Known Allergies      Medication List     STOP taking these medications    lisinopril  20 MG tablet Commonly known as: ZESTRIL    magnesium  oxide 400 MG tablet Commonly known as: MAG-OX   ondansetron  4 MG tablet Commonly known as: ZOFRAN        TAKE these medications    allopurinol  300 MG tablet Commonly known as: ZYLOPRIM  Take 300 mg by mouth daily as needed (for gout flareup).  amLODipine  10 MG tablet Commonly known as: NORVASC  Take 1 tablet  (10 mg total) by mouth daily. Start taking on: January 16, 2024   CertaVite/Antioxidants Tabs Take 1 tablet by mouth daily.   dextromethorphan -guaiFENesin  30-600 MG 12hr tablet Commonly known as: MUCINEX  DM Take 1 tablet by mouth 2 (two) times daily as needed for up to 5 days for cough.   folic acid  1 MG tablet Commonly known as: FOLVITE  Take 1 tablet (1 mg total) by mouth daily.   metoprolol  tartrate 100 MG tablet Commonly known as: LOPRESSOR  Take 100 mg by mouth daily.   ondansetron  4 MG disintegrating tablet Commonly known as: ZOFRAN -ODT Take 1 tablet (4 mg total) by mouth every 8 (eight) hours as needed for nausea or vomiting.   pantoprazole  40 MG tablet Commonly known as: Protonix  Take 1 tablet (40 mg total) by mouth daily.        Discharge Exam: Filed Weights   01/11/24 2037  Weight: 71 kg   General: NAD  Cardiovascular: S1, S2 present Respiratory: CTAB Abdomen: Soft, nontender, nondistended, bowel sounds present Musculoskeletal: No bilateral pedal edema noted Skin: Normal Psychiatry: Normal mood   Condition at discharge: stable  The results of significant diagnostics from this hospitalization (including imaging, microbiology, ancillary and laboratory) are listed below for reference.   Imaging Studies: CT ABDOMEN PELVIS WO CONTRAST Result Date: 01/15/2024 CLINICAL DATA:  Abdominal pain, acute, nonlocalized EXAM: CT ABDOMEN AND PELVIS WITHOUT CONTRAST TECHNIQUE: Multidetector CT imaging of the abdomen and pelvis was performed following the standard protocol without IV contrast. RADIATION DOSE REDUCTION: This exam was performed according to the departmental dose-optimization program which includes automated exposure control, adjustment of the mA and/or kV according to patient size and/or use of iterative reconstruction technique. COMPARISON:  CTA abdomen/pelvis dated 09/05/2023. FINDINGS: Lower chest: Trace right pleural effusion with pleural thickening, right  basilar atelectasis and scarring. Hepatobiliary: No suspicious focal hepatic lesion identified within the limits of an unenhanced exam. Gallbladder is unremarkable. No biliary dilatation. Pancreas: Unremarkable. No pancreatic ductal dilatation or surrounding inflammatory changes. Spleen: Normal in size without focal abnormality. Adrenals/Urinary Tract: Adrenal glands are unremarkable. No renal calculi or hydronephrosis. Bladder is unremarkable. Stomach/Bowel: Stomach and small bowel are grossly unremarkable. No obstruction or inflammatory changes. Appendix appears normal. Vascular/Lymphatic: Abdominal aorta is normal in caliber with scattered atherosclerotic calcification. No enlarged abdominal or pelvic lymph nodes. Reproductive: Prostate is unremarkable. Other: No abdominopelvic ascites. No intraperitoneal free air. No abdominal wall hernia. Musculoskeletal: No acute osseous abnormality. No suspicious osseous lesion. Multilevel degenerative changes of the lumbar spine with disc height loss most pronounced at L5-S1. Left total hip arthroplasty. IMPRESSION: 1. No acute localizing findings in the abdomen or pelvis. 2. Trace right pleural effusion with pleural thickening and basilar atelectasis/scarring. Electronically Signed   By: Harrietta Sherry M.D.   On: 01/15/2024 12:25   US  Abdomen Limited RUQ (LIVER/GB) Result Date: 01/13/2024 EXAM: Right Upper Quadrant Abdominal Ultrasound TECHNIQUE: Real-time ultrasonography of the right upper quadrant of the abdomen was performed. COMPARISON: None. CLINICAL HISTORY: Abdominal pain FINDINGS: LIVER: The hepatic size is within normal limits. The hepatic parenchymal echogenicity is diffusely heterogeneous, nonspecific finding that can be seen in inflammatory, infiltrative or fibrotic conditions such as hepatitis, hepatic steatosis or cirrhosis. Correlation with liver enzymes may be helpful to exclude an obstructive or inflammatory process. No focal intrahepatic masses  identified. There is no intrahepatic biliary ductal dilation. The main portal vein is patent and demonstrates appropriate antegrade flow. BILIARY SYSTEM: Common bile  duct measures 6 mm in proximal diameter. RIGHT KIDNEY: The right kidney is grossly unremarkable in appearances without evidence of hydronephrosis, echogenic calculi or worrisome mass lesions. PANCREAS: The pancreas is obscured by overlying bowel gas. OTHER: No right upper quadrant ascites. IMPRESSION: 1. No acute findings. 2. Hepatic parenchymal echogenicity is diffusely heterogeneous, a nonspecific finding that can be seen in inflammatory, infiltrative, or fibrotic conditions such as hepatitis, hepatic steatosis, or cirrhosis. Correlation with liver enzymes may be helpful to exclude an obstructive or inflammatory process. Electronically signed by: Dorethia Molt MD 01/13/2024 07:25 PM EDT RP Workstation: [id number]   ECHOCARDIOGRAM COMPLETE Result Date: 01/12/2024    ECHOCARDIOGRAM REPORT   Patient Name:   Fairy VEAR Fair III Date of Exam: 01/12/2024 Medical Rec #:  [id number]          Height:       69.0 in Accession #:    [id number]         Weight:       156.5 lb Date of Birth:  June 02, 1960         BSA:          1.862 m Patient Age:    62 years           BP:           152/121 mmHg Patient Gender: M                  HR:           79 bpm. Exam Location:  Inpatient Procedure: 2D Echo, Cardiac Doppler and Color Doppler (Both Spectral and Color            Flow Doppler were utilized during procedure). Indications:    Chest Pain R07.9  History:        Patient has no prior history of Echocardiogram examinations.                 Risk Factors:Hypertension and Dyslipidemia.  Sonographer:    Tinnie Gosling RDCS Referring Phys: [id number] [facility] RATHORE  Sonographer Comments: Suboptimal parasternal window. IMPRESSIONS  1. Left ventricular ejection fraction, by estimation, is 65 to 70%. The left ventricle has normal function. Left ventricular endocardial border not  optimally defined to evaluate regional wall motion. Left ventricular diastolic parameters were normal.  2. Right ventricular systolic function is normal. The right ventricular size is normal. Tricuspid regurgitation signal is inadequate for assessing PA pressure.  3. The mitral valve is normal in structure. Trivial mitral valve regurgitation. No evidence of mitral stenosis.  4. The aortic valve is grossly normal. Aortic valve regurgitation is not visualized. No aortic stenosis is present.  5. The inferior vena cava is normal in size with greater than 50% respiratory variability, suggesting right atrial pressure of 3 mmHg. FINDINGS  Left Ventricle: Left ventricular ejection fraction, by estimation, is 65 to 70%. The left ventricle has normal function. Left ventricular endocardial border not optimally defined to evaluate regional wall motion. The left ventricular internal cavity size was normal in size. There is borderline left ventricular hypertrophy. Left ventricular diastolic parameters were normal. Right Ventricle: The right ventricular size is normal. No increase in right ventricular wall thickness. Right ventricular systolic function is normal. Tricuspid regurgitation signal is inadequate for assessing PA pressure. Left Atrium: Left atrial size was normal in size. Right Atrium: Right atrial size was normal in size. Pericardium: There is no evidence of pericardial effusion. Mitral Valve: The mitral valve is normal in structure. Trivial mitral valve  regurgitation. No evidence of mitral valve stenosis. Tricuspid Valve: The tricuspid valve is normal in structure. Tricuspid valve regurgitation is trivial. No evidence of tricuspid stenosis. Aortic Valve: The aortic valve is grossly normal. Aortic valve regurgitation is not visualized. No aortic stenosis is present. Pulmonic Valve: The pulmonic valve was not well visualized. Pulmonic valve regurgitation is not visualized. No evidence of pulmonic stenosis. Aorta: The  aortic root is normal in size and structure. Venous: The inferior vena cava is normal in size with greater than 50% respiratory variability, suggesting right atrial pressure of 3 mmHg. IAS/Shunts: No atrial level shunt detected by color flow Doppler.  LEFT VENTRICLE PLAX 2D LVIDd:         3.90 cm   Diastology LVIDs:         2.30 cm   LV e' medial:    6.53 cm/s LV PW:         1.00 cm   LV E/e' medial:  9.6 LV IVS:        1.00 cm   LV e' lateral:   8.59 cm/s LVOT diam:     2.10 cm   LV E/e' lateral: 7.3 LV SV:         70 LV SV Index:   38 LVOT Area:     3.46 cm  RIGHT VENTRICLE             IVC RV S prime:     13.60 cm/s  IVC diam: 0.90 cm TAPSE (M-mode): 2.0 cm LEFT ATRIUM             Index        RIGHT ATRIUM           Index LA diam:        2.80 cm 1.50 cm/m   RA Area:     10.20 cm LA Vol (A2C):   32.6 ml 17.51 ml/m  RA Volume:   21.10 ml  11.33 ml/m LA Vol (A4C):   33.1 ml 17.78 ml/m LA Biplane Vol: 34.3 ml 18.42 ml/m  AORTIC VALVE LVOT Vmax:   105.00 cm/s LVOT Vmean:  69.800 cm/s LVOT VTI:    0.203 m  AORTA Ao Root diam: 3.50 cm Ao Asc diam:  3.00 cm MITRAL VALVE MV Area (PHT): 4.04 cm    SHUNTS MV Decel Time: 188 msec    Systemic VTI:  0.20 m MV E velocity: 62.40 cm/s  Systemic Diam: 2.10 cm MV A velocity: 57.60 cm/s MV E/A ratio:  1.08 Soyla Merck MD Electronically signed by Soyla Merck MD Signature Date/Time: 01/12/2024/3:01:09 PM    Final    CT Angio Chest PE W and/or Wo Contrast Result Date: 01/11/2024 CLINICAL DATA:  Chest pain and shortness of breath. EXAM: CT ANGIOGRAPHY CHEST WITH CONTRAST TECHNIQUE: Multidetector CT imaging of the chest was performed using the standard protocol during bolus administration of intravenous contrast. Multiplanar CT image reconstructions and MIPs were obtained to evaluate the vascular anatomy. RADIATION DOSE REDUCTION: This exam was performed according to the departmental dose-optimization program which includes automated exposure control, adjustment of the  mA and/or kV according to patient size and/or use of iterative reconstruction technique. CONTRAST:  75mL OMNIPAQUE  IOHEXOL  350 MG/ML SOLN COMPARISON:  CT angiogram chest 12/18/2023. FINDINGS: Cardiovascular: Satisfactory opacification of the pulmonary arteries to the segmental level. No evidence of pulmonary embolism. Normal heart size. No pericardial effusion. Mediastinum/Nodes: No enlarged mediastinal, hilar, or axillary lymph nodes. Thyroid gland, trachea, and esophagus demonstrate no significant findings. Lungs/Pleura:  Right pleural thickening and trace effusion appear unchanged from prior examination. There is a band of opacity which is also unchanged from most recent prior. There is a slightly nodular component to this band of opacity measuring 1.8 by 1.0 cm. The lungs are otherwise clear. There is no pneumothorax. Upper Abdomen: No acute abnormality. Musculoskeletal: Degenerative changes affect the spine. Review of the MIP images confirms the above findings. IMPRESSION: 1. No evidence for pulmonary embolism. 2. Stable right pleural thickening and trace effusion. 3. Stable band of opacity in the right lower lobe with a slightly nodular component. This may represent rounded atelectasis. Electronically Signed   By: Greig Pique M.D.   On: 01/11/2024 23:07   DG Chest 2 View Result Date: 01/11/2024 CLINICAL DATA:  Chest pain EXAM: CHEST - 2 VIEW COMPARISON:  01/02/2024, chest CT 12/18/2023 FINDINGS: Low lung. Stable cardiomediastinal silhouette with prominent mediastinum likely augmented by low lung volume and patient rotation.nodular left lower lung opacity probably reflects a nipple shadow. No focal airspace disease, pleural effusion or pneumothorax IMPRESSION: 1. No active cardiopulmonary disease. 2. Nodular left lateral llower lung opacity probably reflects a nipple shadow. Electronically Signed   By: Luke Bun M.D.   On: 01/11/2024 21:04   CT Head Wo Contrast Result Date: 01/02/2024 CLINICAL DATA:   Neck trauma, intoxicated or obtunded (Age >= 16y); Head trauma, abnormal mental status (Age 26-64y) EXAM: CT HEAD WITHOUT CONTRAST CT CERVICAL SPINE WITHOUT CONTRAST TECHNIQUE: Multidetector CT imaging of the head and cervical spine was performed following the standard protocol without intravenous contrast. Multiplanar CT image reconstructions of the cervical spine were also generated. RADIATION DOSE REDUCTION: This exam was performed according to the departmental dose-optimization program which includes automated exposure control, adjustment of the mA and/or kV according to patient size and/or use of iterative reconstruction technique. COMPARISON:  08/13/2010 FINDINGS: CT HEAD FINDINGS Brain: Normal anatomic configuration. Parenchymal volume loss is commensurate with the patient's age. Mild periventricular white matter changes are present likely reflecting the sequela of small vessel ischemia. No abnormal intra or extra-axial mass lesion or fluid collection. No abnormal mass effect or midline shift. No evidence of acute intracranial hemorrhage or infarct. Ventricular size is normal. Cerebellum unremarkable. Vascular: No asymmetric hyperdense vasculature at the skull base. Skull: Intact Sinuses/Orbits: Paranasal sinuses are clear. Orbits are unremarkable. Other: Mastoid air cells and middle ear cavities are clear. Small left parietal scalp hematoma CT CERVICAL SPINE FINDINGS Alignment: 3 mm anterolisthesis C7-T1, likely degenerative in nature. Mild cervical kyphosis. Skull base and vertebrae: Craniocervical alignment is normal. The atlantodental interval is not widened. No acute fracture of the cervical spine. Ankylosis of the left facet joints of C2-C4 as well as the vertebral bodies and right facet joints of C2-C3. Soft tissues and spinal canal: No prevertebral fluid or swelling. No visible canal hematoma. Disc levels: Disc space narrowing endplate remodeling and vacuum disc phenomena is seen throughout the  cervical spine in keeping with changes of advanced degenerative disc disease, most severe at C4-C6. Prevertebral soft tissues are not thickened on sagittal reformats. No high-grade canal stenosis. Multilevel uncovertebral and facet arthrosis results in multilevel moderate to severe neuroforaminal narrowing, most severe on the right at C4-5 C6-7 and C7-T1 and on the left at C3-4, C4-5, and C7-T1. Upper chest: Negative. Other: None IMPRESSION: 1. No acute intracranial abnormality. No calvarial fracture. Small left parietal scalp hematoma. 2. No acute fracture or listhesis of the cervical spine. 3. Advanced multilevel degenerative disc and degenerative joint disease resulting in  multilevel moderate to severe neuroforaminal narrowing as described above. Electronically Signed   By: Dorethia Molt M.D.   On: 01/02/2024 21:37   CT Cervical Spine Wo Contrast Result Date: 01/02/2024 CLINICAL DATA:  Neck trauma, intoxicated or obtunded (Age >= 16y); Head trauma, abnormal mental status (Age 41-64y) EXAM: CT HEAD WITHOUT CONTRAST CT CERVICAL SPINE WITHOUT CONTRAST TECHNIQUE: Multidetector CT imaging of the head and cervical spine was performed following the standard protocol without intravenous contrast. Multiplanar CT image reconstructions of the cervical spine were also generated. RADIATION DOSE REDUCTION: This exam was performed according to the departmental dose-optimization program which includes automated exposure control, adjustment of the mA and/or kV according to patient size and/or use of iterative reconstruction technique. COMPARISON:  08/13/2010 FINDINGS: CT HEAD FINDINGS Brain: Normal anatomic configuration. Parenchymal volume loss is commensurate with the patient's age. Mild periventricular white matter changes are present likely reflecting the sequela of small vessel ischemia. No abnormal intra or extra-axial mass lesion or fluid collection. No abnormal mass effect or midline shift. No evidence of acute  intracranial hemorrhage or infarct. Ventricular size is normal. Cerebellum unremarkable. Vascular: No asymmetric hyperdense vasculature at the skull base. Skull: Intact Sinuses/Orbits: Paranasal sinuses are clear. Orbits are unremarkable. Other: Mastoid air cells and middle ear cavities are clear. Small left parietal scalp hematoma CT CERVICAL SPINE FINDINGS Alignment: 3 mm anterolisthesis C7-T1, likely degenerative in nature. Mild cervical kyphosis. Skull base and vertebrae: Craniocervical alignment is normal. The atlantodental interval is not widened. No acute fracture of the cervical spine. Ankylosis of the left facet joints of C2-C4 as well as the vertebral bodies and right facet joints of C2-C3. Soft tissues and spinal canal: No prevertebral fluid or swelling. No visible canal hematoma. Disc levels: Disc space narrowing endplate remodeling and vacuum disc phenomena is seen throughout the cervical spine in keeping with changes of advanced degenerative disc disease, most severe at C4-C6. Prevertebral soft tissues are not thickened on sagittal reformats. No high-grade canal stenosis. Multilevel uncovertebral and facet arthrosis results in multilevel moderate to severe neuroforaminal narrowing, most severe on the right at C4-5 C6-7 and C7-T1 and on the left at C3-4, C4-5, and C7-T1. Upper chest: Negative. Other: None IMPRESSION: 1. No acute intracranial abnormality. No calvarial fracture. Small left parietal scalp hematoma. 2. No acute fracture or listhesis of the cervical spine. 3. 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 Electronically Signed   By: Dorethia Molt M.D.   On: 01/02/2024 21:37   DG Chest Portable 1 View Result Date: 01/02/2024 CLINICAL DATA:  Fall. EXAM: PORTABLE CHEST 1 VIEW COMPARISON:  Chest x-ray 12/21/2018. FINDINGS: The heart size and mediastinal contours are within normal  limits. Both lungs are clear. There is a healed left sixth rib fracture. No acute fractures are seen. IMPRESSION: No active disease. Electronically Signed   By: Greig Pique M.D.   On: 01/02/2024 20:53   DG Chest 2 View Result Date: 12/21/2023 CLINICAL DATA:  Chest pain EXAM: CHEST - 2 VIEW COMPARISON:  12/17/2023 FINDINGS: Low lung volumes. Left basilar opacity, favor atelectasis. No confluent opacity on the right. No effusions. Heart and mediastinal contours are within normal limits. IMPRESSION: Low lung volumes, left base atelectasis. Electronically Signed   By: Franky Crease M.D.   On: 12/21/2023 02:09   CT Angio Chest PE W and/or Wo Contrast Result Date: 12/18/2023 CLINICAL DATA:  Pulmonary embolism (PE) suspected, high prob. Chest pain EXAM: CT  ANGIOGRAPHY CHEST WITH CONTRAST TECHNIQUE: Multidetector CT imaging of the chest was performed using the standard protocol during bolus administration of intravenous contrast. Multiplanar CT image reconstructions and MIPs were obtained to evaluate the vascular anatomy. RADIATION DOSE REDUCTION: This exam was performed according to the departmental dose-optimization program which includes automated exposure control, adjustment of the mA and/or kV according to patient size and/or use of iterative reconstruction technique. CONTRAST:  75mL OMNIPAQUE  IOHEXOL  350 MG/ML SOLN COMPARISON:  09/05/2023 FINDINGS: Cardiovascular: No filling defects in the pulmonary arteries to suggest pulmonary emboli. Heart is normal size. Aorta is normal caliber. Mediastinum/Nodes: No mediastinal, hilar, or axillary adenopathy. Trachea and esophagus are unremarkable. Thyroid unremarkable. Lungs/Pleura: Trace right pleural effusion or pleural thickening, similar to prior study. Right basilar scarring or atelectasis. No confluent opacity or effusion on the left. Upper Abdomen: No acute findings Musculoskeletal: No acute bony abnormality. Old bilateral healed rib fractures. Review of the MIP  images confirms the above findings. IMPRESSION: No evidence of pulmonary embolus. Trace right pleural effusion or pleural thickening with right basilar atelectasis or scarring, unchanged. Electronically Signed   By: Franky Crease M.D.   On: 12/18/2023 00:08   DG Chest 2 View Result Date: 12/17/2023 CLINICAL DATA:  Chest pain EXAM: CHEST - 2 VIEW COMPARISON:  09/05/2023, CT chest 09/05/2023, chest x-ray 04/04/2021 FINDINGS: Stable cardiomediastinal silhouette. Patchy atelectasis or minimal infiltrate at the left lung base. No pleural effusion or pneumothorax IMPRESSION: Patchy atelectasis or minimal infiltrate at the left lung base. Electronically Signed   By: Luke Bun M.D.   On: 12/17/2023 23:22    Microbiology: Results for orders placed or performed during the hospital encounter of 01/11/24  Respiratory (~20 pathogens) panel by PCR     Status: None   Collection Time: 01/12/24  1:01 PM   Specimen: Nasopharyngeal Swab; Respiratory  Result Value Ref Range Status   Adenovirus NOT DETECTED NOT DETECTED Final   Coronavirus 229E NOT DETECTED NOT DETECTED Final    Comment: (NOTE) The Coronavirus on the Respiratory Panel, DOES NOT test for the novel  Coronavirus (2019 nCoV)    Coronavirus HKU1 NOT DETECTED NOT DETECTED Final   Coronavirus NL63 NOT DETECTED NOT DETECTED Final   Coronavirus OC43 NOT DETECTED NOT DETECTED Final   Metapneumovirus NOT DETECTED NOT DETECTED Final   Rhinovirus / Enterovirus NOT DETECTED NOT DETECTED Final   Influenza A NOT DETECTED NOT DETECTED Final   Influenza B NOT DETECTED NOT DETECTED Final   Parainfluenza Virus 1 NOT DETECTED NOT DETECTED Final   Parainfluenza Virus 2 NOT DETECTED NOT DETECTED Final   Parainfluenza Virus 3 NOT DETECTED NOT DETECTED Final   Parainfluenza Virus 4 NOT DETECTED NOT DETECTED Final   Respiratory Syncytial Virus NOT DETECTED NOT DETECTED Final   Bordetella pertussis NOT DETECTED NOT DETECTED Final   Bordetella Parapertussis NOT  DETECTED NOT DETECTED Final   Chlamydophila pneumoniae NOT DETECTED NOT DETECTED Final   Mycoplasma pneumoniae NOT DETECTED NOT DETECTED Final    Comment: Performed at [facility], [address], [place], [place] [address]  Resp panel by RT-PCR (RSV, Flu A&B, Covid) Anterior Nasal Swab     Status: None   Collection Time: 01/12/24  1:01 PM   Specimen: Anterior Nasal Swab  Result Value Ref Range Status   SARS Coronavirus 2 by RT PCR NEGATIVE NEGATIVE Final   Influenza A by PCR NEGATIVE NEGATIVE Final   Influenza B by PCR NEGATIVE NEGATIVE Final    Comment: (NOTE) The  Xpert Xpress SARS-CoV-2/FLU/RSV plus assay is intended as an aid in the diagnosis of influenza from Nasopharyngeal swab specimens and should not be used as a sole basis for treatment. Nasal washings and aspirates are unacceptable for Xpert Xpress SARS-CoV-2/FLU/RSV testing.  Fact Sheet for Patients: BloggerCourse.com  Fact Sheet for Healthcare Providers: SeriousBroker.it  This test is not yet approved or cleared by the [place]  FDA and has been authorized for detection and/or diagnosis of SARS-CoV-2 by FDA under an Emergency Use Authorization (EUA). This EUA will remain in effect (meaning this test can be used) for the duration of the COVID-19 declaration under Section 564(b)(1) of the Act, 21 U.S.C. section 360bbb-3(b)(1), unless the authorization is terminated or revoked.     Resp Syncytial Virus by PCR NEGATIVE NEGATIVE Final    Comment: (NOTE) Fact Sheet for Patients: BloggerCourse.com  Fact Sheet for Healthcare Providers: SeriousBroker.it  This test is not yet approved or cleared by the [place]  FDA and has been authorized for detection and/or diagnosis of SARS-CoV-2 by FDA under an Emergency Use Authorization (EUA). This EUA will remain in effect (meaning this test can be used) for the  duration of the COVID-19 declaration under Section 564(b)(1) of the Act, 21 U.S.C. section 360bbb-3(b)(1), unless the authorization is terminated or revoked.  Performed at [facility], [address] [place], [place] [address]   MRSA Next Gen by PCR, Nasal     Status: None   Collection Time: 01/12/24  1:22 PM   Specimen: Nasal Mucosa; Nasal Swab  Result Value Ref Range Status   MRSA by PCR Next Gen NOT DETECTED NOT DETECTED Final    Comment: (NOTE) The GeneXpert MRSA Assay (FDA approved for NASAL specimens only), is one component of a comprehensive MRSA colonization surveillance program. It is not intended to diagnose MRSA infection nor to guide or monitor treatment for MRSA infections. Test performance is not FDA approved in patients less than 68 years old. Performed at [facility], [address] [place], [place] [address]     Labs: CBC: Recent Labs  Lab 01/11/24 2042 01/13/24 0139 01/14/24 0206 01/15/24 0216  WBC 5.7 4.6 4.4 5.9  NEUTROABS  --  2.3 2.1 2.9  HGB 14.3 12.8* 13.1 13.2  HCT 42.2 38.2* 39.5 39.7  MCV 98.1 100.3* 101.0* 101.0*  PLT 306 229 235 258   Basic Metabolic Panel: Recent Labs  Lab 01/11/24 2042 01/11/24 2307 01/12/24 0503 01/13/24 0139 01/14/24 0206 01/15/24 0216  NA 133*  --  136 136 133* 135  K 3.9  --  3.9 3.8 3.7 4.2  CL 97*  --  103 104 102 104  CO2 18*  --  19* 20* 24 22  GLUCOSE 76  --  93 80 121* 93  BUN 11  --  9 10 9 15   CREATININE 1.02  --  0.94 0.89 1.22 1.28*  CALCIUM  8.7*  --  8.6* 8.7* 8.9 9.7  MG  --  2.0  --   --   --   --    Liver Function Tests: Recent Labs  Lab 01/12/24 0503 01/13/24 0139 01/14/24 0206 01/15/24 0216  AST 50* 65* 49* 46*  ALT 26 30 30 31   ALKPHOS 86 89 91 98  BILITOT 0.5 1.0 0.7 0.2  PROT 7.3 6.8 6.9 6.9  ALBUMIN 3.2* 3.2* 3.2* 3.3*   CBG: No results for input(s): GLUCAP in the last 168 hours.  Discharge time spent: less than 30 minutes.  Signed:  Lebron JINNY Cage, MD [facility] 01/15/2024

## 2024-01-15 NOTE — Plan of Care (Signed)

## 2024-01-15 NOTE — Progress Notes (Signed)
 Went over discharge paperwork and all questions answered. PIV and telemetry removed. All belongings at bedside.

## 2024-01-15 NOTE — TOC Initial Note (Signed)
 Transition of Care [facility]) - Initial/Assessment Note    Patient Details  Name: Devin [name] MRN: [id number] Date of Birth: 1960-07-29  Transition of Care [facility]) CM/SW Contact:    Justina Delcia Czar, RN Phone Number: [phone] 01/15/2024, 2:53 PM  Clinical Narrative:                 Spoke to pt and states he lives with relatives. States he is independent pta. Scheduled an appt with Family Medicine on 01/24/2024 at 1:45 pm. Provided pt with contact number for Medicaid transportation to set up rides to his appts.   Expected Discharge Plan: Home/Self Care Barriers to Discharge: No Barriers Identified   Patient Goals and CMS Choice      Expected Discharge Plan and Services   Discharge Planning Services: CM Consult     Expected Discharge Date: 01/15/24                                    Prior Living Arrangements/Services   Lives with:: Relatives Patient language and need for interpreter reviewed:: Yes Do you feel safe going back to the place where you live?: Yes      Need for Family Participation in Patient Care: No (Comment) Care giver support system in place?: No (comment)   Criminal Activity/Legal Involvement Pertinent to Current Situation/Hospitalization: No - Comment as needed  Activities of Daily Living   ADL Screening (condition at time of admission) Independently performs ADLs?: Yes (appropriate for developmental age) Is the patient deaf or have difficulty hearing?: No Does the patient have difficulty seeing, even when wearing glasses/contacts?: No Does the patient have difficulty concentrating, remembering, or making decisions?: No  Permission Sought/Granted Permission sought to share information with : Case Manager, PCP Permission granted to share information with : Yes, Verbal Permission Granted  Share Information with NAME: Amadeo Coke  Permission granted to share info w AGENCY: PCP  Permission granted to share info w Relationship:  niece  Permission granted to share info w Contact Information: [phone]  Emotional Assessment   Attitude/Demeanor/Rapport: Engaged Affect (typically observed): Accepting Orientation: : Oriented to Self, Oriented to Place, Oriented to  Time, Oriented to Situation   Psych Involvement: No (comment)  Admission diagnosis:  Shortness of breath [R06.02] Chest pain [R07.9] Chest pain, unspecified type [R07.9] Patient Active Problem List   Diagnosis Date Noted   Chest pain 01/12/2024   Alcohol use disorder 01/12/2024   High anion gap metabolic acidosis 01/12/2024   Hyponatremia 01/12/2024   Hypomagnesemia 01/03/2024   AKI (acute kidney injury) (HCC) 01/02/2024   Avascular necrosis of femur head, left (HCC) 04/06/2021   Acute gastritis 04/05/2021   Musculoskeletal chest pain 07/08/2017   Essential hypertension 07/08/2017   Normal coronary arteries 05/28/2017   Medication management 01/29/2017   Normocytic anemia 12/25/2016   Alcoholic cirrhosis (HCC) 11/20/2016   Acute left-sided low back pain without sciatica 11/09/2016   Esophagitis 05/11/2016   GERD with esophagitis 06/21/2015   Transaminitis 06/21/2015   External hemorrhoid 01/12/2015   Depression 09/29/2014   Chronic gout of right elbow 07/07/2014   Hypokalemia 05/04/2013   Homelessness 05/03/2013   H/O medication noncompliance 05/03/2013   Tobacco abuse 07/15/2012   Healthcare maintenance 11/29/2011   Hyperlipidemia 05/25/2010   Alcohol use disorder, moderate, dependence (HCC) 02/02/2010   Hypertensive cardiomyopathy, without heart failure (HCC) 02/02/2010   PCP:  Pcp, No Pharmacy:   [id number] [address] [place], [place] - [address] AT [facility] [address] [place] [address] Phone: [phone] Fax: [phone]  [facility] [address] [place] [address] Phone: [phone] Fax: [phone]  [id number] -  [place], [place] - [address] AT [facility] [address] [place] [address] Phone: [phone] Fax: [phone]     Social Drivers of Health (SDOH) Social History: SDOH Screenings   Food Insecurity: No Food Insecurity (01/13/2024)  Housing: Low Risk  (01/13/2024)  Transportation Needs: Unmet Transportation Needs (01/13/2024)  Utilities: Not At Risk (01/13/2024)  Depression (PHQ2-9): Medium Risk (05/16/2018)  Tobacco Use: High Risk (01/12/2024)   SDOH Interventions: Transportation Interventions: [employer] Provided, Inpatient TOC, Payor Benefit   Readmission Risk Interventions    01/04/2024    9:16 AM  Readmission Risk Prevention Plan  Transportation Screening Complete  PCP or Specialist Appt within 3-5 Days Complete  HRI or Home Care Consult Complete  Social Work Consult for Recovery Care Planning/Counseling Complete  Palliative Care Screening Complete  Medication Review Oceanographer) Referral to Pharmacy
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
## 2024-01-22 ENCOUNTER — Other Ambulatory Visit: Payer: Self-pay

## 2024-01-22 ENCOUNTER — Emergency Department (HOSPITAL_COMMUNITY)
Admission: EM | Admit: 2024-01-22 | Discharge: 2024-01-23 | Disposition: A | Discharge status: Home / Self Care | Attending: Emergency Medicine | Admitting: Emergency Medicine

## 2024-01-22 ENCOUNTER — Encounter (HOSPITAL_COMMUNITY): Payer: Self-pay

## 2024-01-22 ENCOUNTER — Emergency Department (HOSPITAL_COMMUNITY)

## 2024-01-22 DIAGNOSIS — Z79899 Other long term (current) drug therapy: Secondary | ICD-10-CM | POA: Diagnosis not present

## 2024-01-22 DIAGNOSIS — I10 Essential (primary) hypertension: Secondary | ICD-10-CM | POA: Diagnosis not present

## 2024-01-22 DIAGNOSIS — R7401 Elevation of levels of liver transaminase levels: Secondary | ICD-10-CM | POA: Diagnosis not present

## 2024-01-22 DIAGNOSIS — Z720 Tobacco use: Secondary | ICD-10-CM | POA: Insufficient documentation

## 2024-01-22 DIAGNOSIS — R079 Chest pain, unspecified: Secondary | ICD-10-CM | POA: Diagnosis present

## 2024-01-22 DIAGNOSIS — R000 Tachycardia, unspecified: Secondary | ICD-10-CM | POA: Diagnosis not present

## 2024-01-22 DIAGNOSIS — R0789 Other chest pain: Secondary | ICD-10-CM | POA: Insufficient documentation

## 2024-01-22 DIAGNOSIS — E871 Hypo-osmolality and hyponatremia: Secondary | ICD-10-CM | POA: Diagnosis not present

## 2024-01-22 LAB — CBC
HCT: 39.8 % (ref 39.0–52.0)
Hemoglobin: 13.6 g/dL (ref 13.0–17.0)
MCH: 33.6 pg (ref 26.0–34.0)
MCHC: 34.2 g/dL (ref 30.0–36.0)
MCV: 98.3 fL (ref 80.0–100.0)
Platelets: 233 K/uL (ref 150–400)
RBC: 4.05 MIL/uL — ABNORMAL LOW (ref 4.22–5.81)
RDW: 13.3 % (ref 11.5–15.5)
WBC: 4.5 K/uL (ref 4.0–10.5)
nRBC: 0 % (ref 0.0–0.2)

## 2024-01-22 LAB — COMPREHENSIVE METABOLIC PANEL WITH GFR
ALT: 42 U/L (ref 0–44)
AST: 72 U/L — ABNORMAL HIGH (ref 15–41)
Albumin: 3.8 g/dL (ref 3.5–5.0)
Alkaline Phosphatase: 90 U/L (ref 38–126)
Anion gap: 15 (ref 5–15)
BUN: 15 mg/dL (ref 8–23)
CO2: 18 mmol/L — ABNORMAL LOW (ref 22–32)
Calcium: 8.9 mg/dL (ref 8.9–10.3)
Chloride: 98 mmol/L (ref 98–111)
Creatinine, Ser: 1.09 mg/dL (ref 0.61–1.24)
GFR, Estimated: >60 mL/min (ref >60)
Glucose, Bld: 114 mg/dL — ABNORMAL HIGH (ref 70–99)
Potassium: 4.1 mmol/L (ref 3.5–5.1)
Sodium: 131 mmol/L — ABNORMAL LOW (ref 135–145)
Total Bilirubin: 1.2 mg/dL (ref 0.0–1.2)
Total Protein: 7.9 g/dL (ref 6.5–8.1)

## 2024-01-22 LAB — TROPONIN I (HIGH SENSITIVITY): Troponin I (High Sensitivity): 33 ng/L — ABNORMAL HIGH (ref <18)

## 2024-01-22 NOTE — ED Provider Triage Note (Signed)
 Emergency Medicine Provider Triage Evaluation Note  Devin [name] , a 63 y.o. male  was evaluated in triage.  Pt complains of presyncopal episode and right sided chest pain radiating to abdomen.  History of GERD, MI, hypertension  and substance abuse.  Review of Systems  Positive: Chest pain, abdominal pain Negative: Nausea, vomiting, fever, chills,  Physical Exam  BP (!) 116/90 (BP Location: Right Arm)   Pulse (!) 110   Temp 97.6 F (36.4 C) (Oral)   Resp 19   Ht 5' 9 (1.753 m)   Wt 71.7 kg   SpO2 100%   BMI 23.33 kg/m  Gen:   Awake, no distress, tachycardic Resp:  Normal effort  MSK:   Moves extremities without difficulty  Other:    Medical Decision Making  Medically screening exam initiated at 11:05 PM.  Appropriate orders placed.  Devin [name] was informed that the remainder of the evaluation will be completed by another provider, this initial triage assessment does not replace that evaluation, and the importance of remaining in the ED until their evaluation is complete.  Labs and imaging ordered   Devin [name] 01/22/24 2306

## 2024-01-22 NOTE — ED Triage Notes (Signed)
 Ar[place] [facility] [place]. Says he was walking around and began to feel dizzy and have some right sided chest pains that radiate to abdomen.   Says sometimes he has high and low blood pressure.

## 2024-01-23 ENCOUNTER — Emergency Department (HOSPITAL_COMMUNITY)

## 2024-01-23 LAB — TROPONIN I (HIGH SENSITIVITY): Troponin I (High Sensitivity): 33 ng/L — ABNORMAL HIGH (ref <18)

## 2024-01-23 LAB — LIPASE, BLOOD: Lipase: 32 U/L (ref 11–51)

## 2024-01-23 MED ORDER — METOCLOPRAMIDE HCL 5 MG/ML IJ SOLN
10.0000 mg | Freq: Once | INTRAMUSCULAR | Status: AC
  Administered 2024-01-23: 10 mg via INTRAVENOUS
  Filled 2024-01-23: qty 2

## 2024-01-23 MED ORDER — LACTATED RINGERS IV BOLUS
1000.0000 mL | Freq: Once | INTRAVENOUS | Status: AC
Start: 2024-01-23 — End: 2024-01-23
  Administered 2024-01-23: 1000 mL via INTRAVENOUS

## 2024-01-23 MED ORDER — DIPHENHYDRAMINE HCL 25 MG PO CAPS
25.0000 mg | ORAL_CAPSULE | Freq: Once | ORAL | Status: AC
  Administered 2024-01-23: 25 mg via ORAL
  Filled 2024-01-23: qty 1

## 2024-01-23 MED ORDER — IOHEXOL 350 MG/ML SOLN
75.0000 mL | Freq: Once | INTRAVENOUS | Status: AC | PRN
  Administered 2024-01-23: 75 mL via INTRAVENOUS

## 2024-01-23 NOTE — ED Provider Notes (Signed)
 [place] [facility]ERGENCY DEPARTMENT AT [facility] Provider Note   CSN: [id number] Arrival date & time: 01/22/24  2205     Patient presents with: Chest Pain   Devin [name] is a 63 y.o. male.    Chest Pain  Patient is a 63 year old gentleman with a past medical history significant for hypertension hyperlipidemia, chronic back pain, alcohol use disorder, depression, anxiety, reflux, tobacco use, gout  Patient states that since a day or 2 after leaving the hospital he has felt unwell again with similar symptoms to his last hospitalization which were primarily for nausea abdominal pain some chest discomfort and fatigue.  He has a history of cocaine use and alcohol use but tells me he uses neither at all.  On further questioning and explaining that this information will be noted in my chart but is confidential/protected by HIPAA he states his last cocaine was approximately 2 weeks ago and last alcohol intake was 2 days ago in the form of 1 beer.  He denies any current chest pain    Prior to Admission medications   Medication Sig Start Date End Date Taking? Authorizing Provider  allopurinol  (ZYLOPRIM ) 300 MG tablet Take 300 mg by mouth daily as needed (for gout flareup).    [provider]  amLODipine  (NORVASC ) 10 MG tablet Take 1 tablet (10 mg total) by mouth daily. 01/16/24 02/15/24  Ezenduka, Nkeiruka J, MD  folic acid  (FOLVITE ) 1 MG tablet Take 1 tablet (1 mg total) by mouth daily. 01/04/24 01/03/25  Darci Pore, MD  metoprolol  tartrate (LOPRESSOR ) 100 MG tablet Take 100 mg by mouth daily. 09/28/23   [provider]  Multiple Vitamins-Minerals (MULTIVITAMIN WITH MINERALS) tablet Take 1 tablet by mouth daily. 01/04/24   Darci Pore, MD  ondansetron  (ZOFRAN -ODT) 4 MG disintegrating tablet Take 1 tablet (4 mg total) by mouth every 8 (eight) hours as needed for nausea or vomiting. 01/15/24   Donnamarie Lebron PARAS, MD  pantoprazole  (PROTONIX ) 40 MG  tablet Take 1 tablet (40 mg total) by mouth daily. 01/15/24   Ezenduka, Nkeiruka J, MD    Allergies: Patient has no known allergies.    Review of Systems  Cardiovascular:  Positive for chest pain.    Updated Vital Signs BP (!) 151/103   Pulse (!) 123   Temp 98.6 F (37 C) (Oral)   Resp 18   Ht 5' 9 (1.753 m)   Wt 71.7 kg   SpO2 100%   BMI 23.33 kg/m   Physical Exam Vitals and nursing note reviewed.  Constitutional:      General: He is not in acute distress.    Comments: Pleasant well-appearing 63 year old male in no acute distress  HENT:     Head: Normocephalic and atraumatic.     Nose: Nose normal.     Mouth/Throat:     Mouth: Mucous membranes are moist.  Eyes:     General: No scleral icterus. Cardiovascular:     Rate and Rhythm: Regular rhythm. Tachycardia present.     Pulses: Normal pulses.     Heart sounds: Normal heart sounds.  Pulmonary:     Effort: Pulmonary effort is normal. No respiratory distress.     Breath sounds: No wheezing.  Abdominal:     Palpations: Abdomen is soft.     Tenderness: There is abdominal tenderness.     Comments: Diffuse abdominal tenderness no guarding.  Notably when I deeply palpate patient's abdomen well using my stethoscope and auscultate patient does not react  or flinch or indicate pain  Musculoskeletal:     Cervical back: Normal range of motion.     Right lower leg: No edema.     Left lower leg: No edema.  Skin:    General: Skin is warm and dry.     Capillary Refill: Capillary refill takes less than 2 seconds.  Neurological:     Mental Status: He is alert. Mental status is at baseline.     Comments: Alert and oriented to self, place, time and event.   Speech is fluent, clear without dysarthria or dysphasia.   Strength 5/5 in upper/lower extremities   Sensation intact in upper/lower extremities   CN I not tested  CN II grossly intact visual fields bilaterally. Did not visualize posterior eye.  CN [name], IV, VI PERRLA and  EOMs intact bilaterally  CN V Intact sensation to sharp and light touch to the face  CN VII facial movements symmetric  CN VIII not tested  CN IX, X no uvula deviation, symmetric rise of soft palate  CN XI 5/5 SCM and trapezius strength bilaterally  CN XII Midline tongue protrusion, symmetric L/R movements     Psychiatric:        Mood and Affect: Mood normal.        Behavior: Behavior normal.     (all labs ordered are listed, but only abnormal results are displayed) Labs Reviewed  CBC - Abnormal; Notable for the following components:      Result Value   RBC 4.05 (*)    All other components within normal limits  COMPREHENSIVE METABOLIC PANEL WITH GFR - Abnormal; Notable for the following components:   Sodium 131 (*)    CO2 18 (*)    Glucose, Bld 114 (*)    AST 72 (*)    All other components within normal limits  TROPONIN I (HIGH SENSITIVITY) - Abnormal; Notable for the following components:   Troponin I (High Sensitivity) 33 (*)    All other components within normal limits  TROPONIN I (HIGH SENSITIVITY) - Abnormal; Notable for the following components:   Troponin I (High Sensitivity) 33 (*)    All other components within normal limits  LIPASE, BLOOD  RAPID URINE DRUG SCREEN, HOSP PERFORMED    EKG: EKG Interpretation Date/Time:  Tuesday January 22 2024 22:26:33 EDT Ventricular Rate:  108 PR Interval:  146 QRS Duration:  70 QT Interval:  312 QTC Calculation: 418 R Axis:   41  Text Interpretation: Sinus tachycardia Possible Left atrial enlargement Nonspecific T wave abnormality Abnormal ECG When compared with ECG of 11-Jan-2024 20:39, PREVIOUS ECG IS PRESENT Confirmed by Midge Golas ([id number]) on 01/23/2024 9:49:56 AM  Radiology: CT Angio Chest PE W/Cm &/Or Wo Cm Result Date: 01/23/2024 CLINICAL DATA:  Epigastric pain. EXAM: CT ANGIOGRAPHY CHEST CT ABDOMEN AND PELVIS WITH CONTRAST TECHNIQUE: Multidetector CT imaging of the chest was performed using the standard  protocol during bolus administration of intravenous contrast. Multiplanar CT image reconstructions and MIPs were obtained to evaluate the vascular anatomy. Multidetector CT imaging of the abdomen and pelvis was performed using the standard protocol during bolus administration of intravenous contrast. RADIATION DOSE REDUCTION: This exam was performed according to the departmental dose-optimization program which includes automated exposure control, adjustment of the mA and/or kV according to patient size and/or use of iterative reconstruction technique. CONTRAST:  75mL OMNIPAQUE  IOHEXOL  350 MG/ML SOLN COMPARISON:  CT abdomen pelvis 01/15/2024 and CT chest 01/11/2024. FINDINGS: CTA CHEST FINDINGS Cardiovascular: Negative for pulmonary embolus.  Heart is at the upper limits of normal in size. Left ventricle is hypertrophied. No pericardial effusion. Mediastinum/Nodes: No pathologically enlarged mediastinal, hilar or axillary lymph nodes. Esophagus is grossly unremarkable. Lungs/Pleura: Centrilobular and paraseptal emphysema. Subsegmental atelectasis in the right lower lobe with adjacent pleural thickening and small amount of pleural fluid. Lungs are otherwise clear. Airway is unremarkable. Musculoskeletal: Degenerative changes in the spine. Old sternal and bilateral rib fractures. Review of the MIP images confirms the above findings. CT ABDOMEN and PELVIS FINDINGS Hepatobiliary: Liver and gallbladder are unremarkable. No biliary ductal dilatation. Pancreas: Negative. Spleen: Negative. Adrenals/Urinary Tract: Adrenal glands and kidneys are unremarkable. Ureters are decompressed. Bladder is low in volume and largely obscured by streak artifact from a left hip arthroplasty. Stomach/Bowel: Stomach, small bowel, appendix and colon are unremarkable. Vascular/Lymphatic: Atherosclerotic calcification of the aorta. No pathologically enlarged lymph nodes. Reproductive: Prostate is largely obscured by streak artifact from a left  hip arthroplasty. Other: No free fluid.  Mesenteries and peritoneum are unremarkable. Musculoskeletal: Left hip arthroplasty. Degenerative changes in the spine. Review of the MIP images confirms the above findings. IMPRESSION: 1. Negative for pulmonary embolus. 2. No acute findings. 3. Pleuroparenchymal scarring in the base of the right hemithorax. 4.  Aortic atherosclerosis (ICD10-I70.0). 5.  Emphysema (ICD10-J43.9). Electronically Signed   By: Newell Eke M.D.   On: 01/23/2024 14:22   CT ABDOMEN PELVIS W CONTRAST Result Date: 01/23/2024 CLINICAL DATA:  Epigastric pain. EXAM: CT ANGIOGRAPHY CHEST CT ABDOMEN AND PELVIS WITH CONTRAST TECHNIQUE: Multidetector CT imaging of the chest was performed using the standard protocol during bolus administration of intravenous contrast. Multiplanar CT image reconstructions and MIPs were obtained to evaluate the vascular anatomy. Multidetector CT imaging of the abdomen and pelvis was performed using the standard protocol during bolus administration of intravenous contrast. RADIATION DOSE REDUCTION: This exam was performed according to the departmental dose-optimization program which includes automated exposure control, adjustment of the mA and/or kV according to patient size and/or use of iterative reconstruction technique. CONTRAST:  75mL OMNIPAQUE  IOHEXOL  350 MG/ML SOLN COMPARISON:  CT abdomen pelvis 01/15/2024 and CT chest 01/11/2024. FINDINGS: CTA CHEST FINDINGS Cardiovascular: Negative for pulmonary embolus. Heart is at the upper limits of normal in size. Left ventricle is hypertrophied. No pericardial effusion. Mediastinum/Nodes: No pathologically enlarged mediastinal, hilar or axillary lymph nodes. Esophagus is grossly unremarkable. Lungs/Pleura: Centrilobular and paraseptal emphysema. Subsegmental atelectasis in the right lower lobe with adjacent pleural thickening and small amount of pleural fluid. Lungs are otherwise clear. Airway is unremarkable.  Musculoskeletal: Degenerative changes in the spine. Old sternal and bilateral rib fractures. Review of the MIP images confirms the above findings. CT ABDOMEN and PELVIS FINDINGS Hepatobiliary: Liver and gallbladder are unremarkable. No biliary ductal dilatation. Pancreas: Negative. Spleen: Negative. Adrenals/Urinary Tract: Adrenal glands and kidneys are unremarkable. Ureters are decompressed. Bladder is low in volume and largely obscured by streak artifact from a left hip arthroplasty. Stomach/Bowel: Stomach, small bowel, appendix and colon are unremarkable. Vascular/Lymphatic: Atherosclerotic calcification of the aorta. No pathologically enlarged lymph nodes. Reproductive: Prostate is largely obscured by streak artifact from a left hip arthroplasty. Other: No free fluid.  Mesenteries and peritoneum are unremarkable. Musculoskeletal: Left hip arthroplasty. Degenerative changes in the spine. Review of the MIP images confirms the above findings. IMPRESSION: 1. Negative for pulmonary embolus. 2. No acute findings. 3. Pleuroparenchymal scarring in the base of the right hemithorax. 4.  Aortic atherosclerosis (ICD10-I70.0). 5.  Emphysema (ICD10-J43.9). Electronically Signed   By: Newell Eke M.D.   On:  01/23/2024 14:22   DG Chest 2 View Result Date: 01/22/2024 EXAM: 2 VIEW(S) XRAY OF THE CHEST 01/22/2024 10:56:00 PM COMPARISON: 01/11/2024 CLINICAL HISTORY: chest pain. [place] [facility] [place] Says he was walking around and began to feel dizzy and have some right sided chest pains that radiate to abdomen. ; ; Says sometimes he has high and low blood pressure FINDINGS: LUNGS AND PLEURA: Small right pleural effusion. No pneumothorax. No focal pulmonary opacity. No pulmonary edema. HEART AND MEDIASTINUM: Tortuous thoracic aorta. Ascending aortic aneurysm. No acute abnormality of the cardiac and mediastinal silhouettes. BONES AND SOFT TISSUES: Old healed left rib fractures. No acute osseous abnormality.  IMPRESSION: 1. Small right pleural effusion. 2. Tortuous thoracic aorta and ascending aortic aneurysm. Electronically signed by: Dorethia Molt MD 01/22/2024 11:03 PM EDT RP Workstation: [id number]     Procedures   Medications Ordered in the ED  lactated ringers  bolus 1,000 mL (1,000 mLs Intravenous New Bag/Given 01/23/24 1406)  metoCLOPramide  (REGLAN ) injection 10 mg (10 mg Intravenous Given 01/23/24 1359)  diphenhydrAMINE  (BENADRYL ) capsule 25 mg (25 mg Oral Given 01/23/24 1358)  iohexol  (OMNIPAQUE ) 350 MG/ML injection 75 mL (75 mLs Intravenous Contrast Given 01/23/24 1351)    Clinical Course as of 01/23/24 1424  Wed Jan 23, 2024  1257 Cocaine twice a week approximately - last 2-3 weeks ago.   Last etoh 2 days ago (1 beer).  [WF]  1258 Last felt well after hospital stay. Back to feeling unwell. Abd pain nausea and cp.  No recent vomiting or diarrhea.  [WF]    Clinical Course User Index [WF] Neldon Hamp RAMAN, [place]                                 Medical Decision Making Amount and/or Complexity of Data Reviewed Labs: ordered. Radiology: ordered.  Risk Prescription drug management.   This patient presents to the ED for concern of cp/abd pain, this involves a number of treatment options, and is a complaint that carries with it a moderate to high risk of complications and morbidity. A differential diagnosis was considered for the patient's symptoms which is discussed below:   The emergent causes of chest pain include: Acute coronary syndrome, tamponade, pericarditis/myocarditis, aortic dissection, pulmonary embolism, tension pneumothorax, pneumonia, and esophageal rupture.  I do not believe the patient has an emergent cause of chest pain, other urgent/non-acute considerations include, but are not limited to: chronic angina, aortic stenosis, cardiomyopathy, mitral valve prolapse, pulmonary hypertension, aortic insufficiency, right ventricular hypertrophy, pleuritis, bronchitis,  pneumothorax, tumor, gastroesophageal reflux disease (GERD), esophageal spasm, Mallory-Weiss syndrome, peptic ulcer disease, pancreatitis, functional gastrointestinal pain, cervical or thoracic disk disease or arthritis, shoulder arthritis, costochondritis, subacromial bursitis, anxiety or panic attack, herpes zoster, breast disorders, chest wall tumors, thoracic outlet syndrome, mediastinitis.  The causes of generalized abdominal pain include but are not limited to AAA, mesenteric ischemia, appendicitis, diverticulitis, DKA, gastritis, gastroenteritis, AMI, nephrolithiasis, pancreatitis, peritonitis, adrenal insufficiency,lead poisoning, iron toxicity, intestinal ischemia, constipation, UTI,SBO/LBO, splenic rupture, biliary disease, IBD, IBS, PUD, or hepatitis.    Co morbidities: Discussed in HPI   Brief History:  Patient is a 63 year old gentleman with a past medical history significant for hypertension hyperlipidemia, chronic back pain, alcohol use disorder, depression, anxiety, reflux, tobacco use, gout  Patient states that since a day or 2 after leaving the hospital he has felt unwell again with similar symptoms to his last hospitalization which were primarily for nausea abdominal  pain some chest discomfort and fatigue.  He has a history of cocaine use and alcohol use but tells me he uses neither at all.  On further questioning and explaining that this information will be noted in my chart but is confidential/protected by HIPAA he states his last cocaine was approximately 2 weeks ago and last alcohol intake was 2 days ago in the form of 1 beer.  He denies any current chest pain    EMR reviewed including pt PMHx, past surgical history and past visits to ER.   See HPI for more details   Lab Tests:   I ordered and independently interpreted labs. Labs notable for Mild hyponatremia 131 mild elevation in AST consistent with alcohol use troponins stable at 33 with no acute elevation.  No  acute chest pain presently.  Imaging Studies:  NAD. I personally reviewed all imaging studies and no acute abnormality found. I agree with radiology interpretation.    Cardiac Monitoring:  The patient was maintained on a cardiac monitor.  I personally viewed and interpreted the cardiac monitored which showed an underlying rhythm of: Sinus tachycardia EKG non-ischemic   Medicines ordered:  I ordered medication including lactated Ringer 's, Reglan , Benadryl  for dehydration, headache Reevaluation of the patient after these medicines showed that the patient improved I have reviewed the patients home medicines and have made adjustments as needed   Critical Interventions:     Consults/Attending Physician   I discussed this case with my attending physician who cosigned this note including patient's presenting symptoms, physical exam, and planned diagnostics and interventions. Attending physician stated agreement with plan or made changes to plan which were implemented.   Reevaluation:  After the interventions noted above I re-evaluated patient and found that they have :improved   Social Determinants of Health:  The patient's social determinants of health were a factor in the care of this patient    Problem List / ED Course:  Patient is a 63 year old male experiencing housing instability with a history of cocaine use and alcohol use presented emergency room today with multiple complaints including chest pain abdominal pain headache fatigue nausea seems that most of his symptoms are improved except for abdominal pain at the time of my evaluation.  Patient has a somewhat unusual exam and that he is reacting strongly and demonstrating that he is very tender when I push on his abdomen however when I use my stethoscope to palpate his abdomen he does not react.  He looks well and in no acute distress he is tachycardic but this may be related to cocaine use.  Patient was recently  admitted to the hospitalist service for chest pain and shortness of breath had a reassuring workup no specific findings were discovered.  His echocardiogram showed no specific regional wall motion abnormalities within the constraints of the specific ultrasound which did not show the left ventricular tubular endocardial border.  Patient without any episodes of syncope.  Reassuring workup here.  I have some concerns that this patient may have some intentions for secondary gain as he was just recently in the hospital for 4 days and is returning of similar symptoms.   Dispostion:  After consideration of the diagnostic results and the patients response to treatment, I feel that the patent would benefit from outpatient follow-up with cardiology.   Final diagnoses:  Chest pain, unspecified type  Other chest pain    ED Discharge Orders     None  Neldon Inoue [place], [place] 01/23/24 1611    Levander Houston, MD 01/23/24 [phone]

## 2024-01-23 NOTE — ED Notes (Signed)
Trop 33

## 2024-01-23 NOTE — Discharge Instructions (Addendum)
 Please stop using cocaine, please use alcohol sparingly and slowly decrease amount of alcohol you intake.  The goal is to drink less than 3 drinks per week.  Make sure you are drinking plenty of water , return to the emergency room for any new or concerning symptoms such as chest pain difficulty breathing.  I am reassured that your workup today was without any acute abnormal findings.  Please follow-up with a cardiologist I given you the information for their office and placed a referral in the system.

## 2024-01-24 ENCOUNTER — Ambulatory Visit

## 2024-01-29 ENCOUNTER — Other Ambulatory Visit: Payer: Self-pay

## 2024-01-29 ENCOUNTER — Emergency Department (HOSPITAL_COMMUNITY)

## 2024-01-29 ENCOUNTER — Encounter (HOSPITAL_COMMUNITY): Payer: Self-pay | Admitting: *Deleted

## 2024-01-29 ENCOUNTER — Observation Stay (HOSPITAL_COMMUNITY)
Admission: EM | Admit: 2024-01-29 | Discharge: 2024-01-30 | Disposition: A | Discharge status: Home / Self Care | Attending: Family Medicine | Admitting: Family Medicine

## 2024-01-29 DIAGNOSIS — E785 Hyperlipidemia, unspecified: Secondary | ICD-10-CM | POA: Insufficient documentation

## 2024-01-29 DIAGNOSIS — K2100 Gastro-esophageal reflux disease with esophagitis, without bleeding: Secondary | ICD-10-CM | POA: Insufficient documentation

## 2024-01-29 DIAGNOSIS — Z5900 Homelessness unspecified: Secondary | ICD-10-CM | POA: Diagnosis not present

## 2024-01-29 DIAGNOSIS — R0789 Other chest pain: Principal | ICD-10-CM | POA: Insufficient documentation

## 2024-01-29 DIAGNOSIS — F1290 Cannabis use, unspecified, uncomplicated: Secondary | ICD-10-CM | POA: Insufficient documentation

## 2024-01-29 DIAGNOSIS — I10 Essential (primary) hypertension: Secondary | ICD-10-CM | POA: Insufficient documentation

## 2024-01-29 DIAGNOSIS — R079 Chest pain, unspecified: Principal | ICD-10-CM | POA: Diagnosis present

## 2024-01-29 DIAGNOSIS — K7030 Alcoholic cirrhosis of liver without ascites: Secondary | ICD-10-CM | POA: Diagnosis not present

## 2024-01-29 DIAGNOSIS — Z79899 Other long term (current) drug therapy: Secondary | ICD-10-CM | POA: Insufficient documentation

## 2024-01-29 DIAGNOSIS — N179 Acute kidney failure, unspecified: Secondary | ICD-10-CM | POA: Diagnosis not present

## 2024-01-29 DIAGNOSIS — E871 Hypo-osmolality and hyponatremia: Secondary | ICD-10-CM | POA: Insufficient documentation

## 2024-01-29 DIAGNOSIS — Z1152 Encounter for screening for COVID-19: Secondary | ICD-10-CM | POA: Insufficient documentation

## 2024-01-29 DIAGNOSIS — Z7982 Long term (current) use of aspirin: Secondary | ICD-10-CM | POA: Diagnosis not present

## 2024-01-29 DIAGNOSIS — R000 Tachycardia, unspecified: Secondary | ICD-10-CM

## 2024-01-29 DIAGNOSIS — I493 Ventricular premature depolarization: Secondary | ICD-10-CM | POA: Diagnosis not present

## 2024-01-29 DIAGNOSIS — K7460 Unspecified cirrhosis of liver: Secondary | ICD-10-CM | POA: Insufficient documentation

## 2024-01-29 DIAGNOSIS — F1020 Alcohol dependence, uncomplicated: Secondary | ICD-10-CM | POA: Insufficient documentation

## 2024-01-29 DIAGNOSIS — F32A Depression, unspecified: Secondary | ICD-10-CM | POA: Diagnosis present

## 2024-01-29 DIAGNOSIS — D649 Anemia, unspecified: Secondary | ICD-10-CM | POA: Diagnosis not present

## 2024-01-29 DIAGNOSIS — F17210 Nicotine dependence, cigarettes, uncomplicated: Secondary | ICD-10-CM | POA: Diagnosis not present

## 2024-01-29 LAB — BASIC METABOLIC PANEL WITH GFR
Anion gap: 12 (ref 5–15)
Anion gap: 11 (ref 5–15)
Anion gap: 13 (ref 5–15)
BUN: 15 mg/dL (ref 8–23)
BUN: 15 mg/dL (ref 8–23)
BUN: 17 mg/dL (ref 8–23)
CO2: 20 mmol/L — ABNORMAL LOW (ref 22–32)
CO2: 21 mmol/L — ABNORMAL LOW (ref 22–32)
CO2: 23 mmol/L (ref 22–32)
Calcium: 8.8 mg/dL — ABNORMAL LOW (ref 8.9–10.3)
Calcium: 8.7 mg/dL — ABNORMAL LOW (ref 8.9–10.3)
Calcium: 8.8 mg/dL — ABNORMAL LOW (ref 8.9–10.3)
Chloride: 95 mmol/L — ABNORMAL LOW (ref 98–111)
Chloride: 97 mmol/L — ABNORMAL LOW (ref 98–111)
Chloride: 95 mmol/L — ABNORMAL LOW (ref 98–111)
Creatinine, Ser: 0.95 mg/dL (ref 0.61–1.24)
Creatinine, Ser: 1.44 mg/dL — ABNORMAL HIGH (ref 0.61–1.24)
Creatinine, Ser: 1.55 mg/dL — ABNORMAL HIGH (ref 0.61–1.24)
GFR, Estimated: >60 mL/min (ref >60)
GFR, Estimated: 55 mL/min — ABNORMAL LOW (ref >60)
GFR, Estimated: 50 mL/min — ABNORMAL LOW (ref >60)
Glucose, Bld: 106 mg/dL — ABNORMAL HIGH (ref 70–99)
Glucose, Bld: 138 mg/dL — ABNORMAL HIGH (ref 70–99)
Glucose, Bld: 107 mg/dL — ABNORMAL HIGH (ref 70–99)
Potassium: 4.6 mmol/L (ref 3.5–5.1)
Potassium: 4.1 mmol/L (ref 3.5–5.1)
Potassium: 4.4 mmol/L (ref 3.5–5.1)
Sodium: 127 mmol/L — ABNORMAL LOW (ref 135–145)
Sodium: 129 mmol/L — ABNORMAL LOW (ref 135–145)
Sodium: 131 mmol/L — ABNORMAL LOW (ref 135–145)

## 2024-01-29 LAB — RESP PANEL BY RT-PCR (RSV, FLU A&B, COVID)  RVPGX2
Influenza A by PCR: NEGATIVE
Influenza B by PCR: NEGATIVE
Resp Syncytial Virus by PCR: NEGATIVE
SARS Coronavirus 2 by RT PCR: NEGATIVE

## 2024-01-29 LAB — CBC
HCT: 41.2 % (ref 39.0–52.0)
Hemoglobin: 14.6 g/dL (ref 13.0–17.0)
MCH: 33.8 pg (ref 26.0–34.0)
MCHC: 35.4 g/dL (ref 30.0–36.0)
MCV: 95.4 fL (ref 80.0–100.0)
Platelets: 279 K/uL (ref 150–400)
RBC: 4.32 MIL/uL (ref 4.22–5.81)
RDW: 12.5 % (ref 11.5–15.5)
WBC: 3.2 K/uL — ABNORMAL LOW (ref 4.0–10.5)
nRBC: 0 % (ref 0.0–0.2)

## 2024-01-29 LAB — HEPATIC FUNCTION PANEL
ALT: 79 U/L — ABNORMAL HIGH (ref 0–44)
AST: 240 U/L — ABNORMAL HIGH (ref 15–41)
Albumin: 3.4 g/dL — ABNORMAL LOW (ref 3.5–5.0)
Alkaline Phosphatase: 96 U/L (ref 38–126)
Bilirubin, Direct: 0.4 mg/dL — ABNORMAL HIGH (ref 0.0–0.2)
Indirect Bilirubin: 1.1 mg/dL — ABNORMAL HIGH (ref 0.3–0.9)
Total Bilirubin: 1.5 mg/dL — ABNORMAL HIGH (ref 0.0–1.2)
Total Protein: 7.3 g/dL (ref 6.5–8.1)

## 2024-01-29 LAB — RAPID URINE DRUG SCREEN, HOSP PERFORMED
Amphetamines: NOT DETECTED
Barbiturates: NOT DETECTED
Benzodiazepines: NOT DETECTED
Cocaine: NOT DETECTED
Opiates: NOT DETECTED
Tetrahydrocannabinol: POSITIVE — AB

## 2024-01-29 LAB — TROPONIN I (HIGH SENSITIVITY)
Troponin I (High Sensitivity): 40 ng/L — ABNORMAL HIGH (ref <18)
Troponin I (High Sensitivity): 39 ng/L — ABNORMAL HIGH (ref <18)

## 2024-01-29 LAB — ETHANOL: Alcohol, Ethyl (B): <15 mg/dL (ref <15)

## 2024-01-29 LAB — BRAIN NATRIURETIC PEPTIDE: B Natriuretic Peptide: 12.2 pg/mL (ref 0.0–100.0)

## 2024-01-29 MED ORDER — SODIUM CHLORIDE 0.9 % IV BOLUS
500.0000 mL | Freq: Once | INTRAVENOUS | Status: AC
  Administered 2024-01-29: 500 mL via INTRAVENOUS

## 2024-01-29 MED ORDER — METOPROLOL TARTRATE 25 MG PO TABS
100.0000 mg | ORAL_TABLET | Freq: Every day | ORAL | Status: DC
Start: 2024-01-30 — End: 2024-01-29

## 2024-01-29 MED ORDER — ENOXAPARIN SODIUM 40 MG/0.4ML IJ SOSY
40.0000 mg | PREFILLED_SYRINGE | INTRAMUSCULAR | Status: DC
  Administered 2024-01-30: 40 mg via SUBCUTANEOUS
  Filled 2024-01-29: qty 0.4

## 2024-01-29 MED ORDER — ACETAMINOPHEN 650 MG RE SUPP: 650.0000 mg | Freq: Four times a day (QID) | RECTAL | Status: DC | PRN

## 2024-01-29 MED ORDER — ALUM & MAG HYDROXIDE-SIMETH 200-200-20 MG/5ML PO SUSP: 30.0000 mL | ORAL | Status: DC | PRN

## 2024-01-29 MED ORDER — PANTOPRAZOLE SODIUM 40 MG PO TBEC
40.0000 mg | DELAYED_RELEASE_TABLET | Freq: Every day | ORAL | Status: DC
  Administered 2024-01-30: 40 mg via ORAL
  Filled 2024-01-29: qty 1

## 2024-01-29 MED ORDER — SODIUM CHLORIDE 0.9% FLUSH
3.0000 mL | Freq: Two times a day (BID) | INTRAVENOUS | Status: DC
  Administered 2024-01-29: 3 mL via INTRAVENOUS

## 2024-01-29 MED ORDER — ADULT MULTIVITAMIN W/MINERALS CH
1.0000 | ORAL_TABLET | Freq: Every day | ORAL | Status: DC
  Administered 2024-01-30: 1 via ORAL
  Filled 2024-01-29: qty 1

## 2024-01-29 MED ORDER — ENSURE PLUS HIGH PROTEIN PO LIQD
237.0000 mL | Freq: Two times a day (BID) | ORAL | Status: DC
  Administered 2024-01-29 – 2024-01-30 (×2): 237 mL via ORAL

## 2024-01-29 MED ORDER — GUAIFENESIN-DM 100-10 MG/5ML PO SYRP
5.0000 mL | ORAL_SOLUTION | ORAL | Status: DC | PRN
  Administered 2024-01-29 – 2024-01-30 (×3): 5 mL via ORAL
  Filled 2024-01-29 (×3): qty 5

## 2024-01-29 MED ORDER — ACETAMINOPHEN 325 MG PO TABS: 650.0000 mg | ORAL_TABLET | Freq: Four times a day (QID) | ORAL | Status: DC | PRN

## 2024-01-29 MED ORDER — FOLIC ACID 1 MG PO TABS
1.0000 mg | ORAL_TABLET | Freq: Every day | ORAL | Status: DC
  Administered 2024-01-30: 1 mg via ORAL
  Filled 2024-01-29: qty 1

## 2024-01-29 MED ORDER — METOPROLOL TARTRATE 5 MG/5ML IV SOLN
2.5000 mg | Freq: Once | INTRAVENOUS | Status: AC
Start: 2024-01-29 — End: 2024-01-29
  Administered 2024-01-29: 2.5 mg via INTRAVENOUS
  Filled 2024-01-29: qty 5

## 2024-01-29 MED ORDER — SALINE SPRAY 0.65 % NA SOLN: 1.0000 | NASAL | Status: DC | PRN

## 2024-01-29 MED ORDER — METOPROLOL TARTRATE 100 MG PO TABS
100.0000 mg | ORAL_TABLET | Freq: Every day | ORAL | Status: DC
Start: 2024-01-29 — End: 2024-01-30
  Administered 2024-01-29 – 2024-01-30 (×2): 100 mg via ORAL
  Filled 2024-01-29: qty 4
  Filled 2024-01-29: qty 1

## 2024-01-29 MED ORDER — NITROGLYCERIN 2 % TD OINT
0.5000 [in_us] | TOPICAL_OINTMENT | Freq: Once | TRANSDERMAL | Status: AC
  Administered 2024-01-29: 0.5 [in_us] via TOPICAL
  Filled 2024-01-29: qty 1

## 2024-01-29 MED ORDER — SODIUM CHLORIDE 0.9 % IV SOLN: INTRAVENOUS | Status: DC

## 2024-01-29 MED ORDER — POLYETHYLENE GLYCOL 3350 17 G PO PACK: 17.0000 g | PACK | Freq: Every day | ORAL | Status: DC | PRN

## 2024-01-29 MED ORDER — ASPIRIN 81 MG PO CHEW
324.0000 mg | CHEWABLE_TABLET | Freq: Once | ORAL | Status: AC
  Administered 2024-01-29: 324 mg via ORAL
  Filled 2024-01-29: qty 4

## 2024-01-29 MED ORDER — AMLODIPINE BESYLATE 10 MG PO TABS
10.0000 mg | ORAL_TABLET | Freq: Every day | ORAL | Status: DC
  Administered 2024-01-30: 10 mg via ORAL
  Filled 2024-01-29: qty 1

## 2024-01-29 NOTE — ED Triage Notes (Signed)
 Pt is here by [facility] due to chest pain x2 hours.  Pain is in his central chest and feels sharp and is associated with sob.

## 2024-01-29 NOTE — Progress Notes (Signed)
 Cold beverage

## 2024-01-29 NOTE — ED Provider Notes (Signed)
 [place] EMERGENCY DEPARTMENT AT [facility] Provider Note   CSN: [id number] Arrival date & time: 01/29/24  1053     Patient presents with: Chest Pain   Devin [name] is a 63 y.o. male.   63 year old male presents for evaluation of chest pain.  States this started today.  States is worse than usual he is also very short of breath.  He does not wear oxygen at home.  States that the pain in the center of his chest and nonradiating gets worse when he ambulates.  Admits to some lightheadedness and nausea as well.  Denies any other symptoms or concerns.   Chest Pain Associated symptoms: shortness of breath   Associated symptoms: no abdominal pain, no back pain, no cough, no fever, no palpitations and no vomiting        Prior to Admission medications   Medication Sig Start Date End Date Taking? Authorizing Provider  acetaminophen  (TYLENOL ) 500 MG tablet Take 1,000 mg by mouth once as needed for moderate pain (pain score 4-6).   Yes [provider]  allopurinol  (ZYLOPRIM ) 300 MG tablet Take 300 mg by mouth daily.   Yes [provider]  amLODipine  (NORVASC ) 10 MG tablet Take 1 tablet (10 mg total) by mouth daily. 01/16/24 02/15/24 Yes Donnamarie Lebron PARAS, MD  folic acid  (FOLVITE ) 1 MG tablet Take 1 tablet (1 mg total) by mouth daily. 01/04/24 01/03/25 Yes Darci Pore, MD  metoprolol  tartrate (LOPRESSOR ) 100 MG tablet Take 100 mg by mouth daily. 09/28/23  Yes [provider]  Multiple Vitamins-Minerals (MULTIVITAMIN WITH MINERALS) tablet Take 1 tablet by mouth daily. 01/04/24  Yes Darci Pore, MD  ondansetron  (ZOFRAN -ODT) 4 MG disintegrating tablet Take 1 tablet (4 mg total) by mouth every 8 (eight) hours as needed for nausea or vomiting. 01/15/24  Yes Donnamarie Lebron PARAS, MD  pantoprazole  (PROTONIX ) 40 MG tablet Take 1 tablet (40 mg total) by mouth daily. 01/15/24  Yes Ezenduka, Nkeiruka J, MD    Allergies: Patient has no known  allergies.    Review of Systems  Constitutional:  Negative for chills and fever.  HENT:  Negative for ear pain and sore throat.   Eyes:  Negative for pain and visual disturbance.  Respiratory:  Positive for shortness of breath. Negative for cough.   Cardiovascular:  Positive for chest pain. Negative for palpitations.  Gastrointestinal:  Negative for abdominal pain and vomiting.  Genitourinary:  Negative for dysuria and hematuria.  Musculoskeletal:  Negative for arthralgias and back pain.  Skin:  Negative for color change and rash.  Neurological:  Negative for seizures and syncope.  All other systems reviewed and are negative.   Updated Vital Signs BP (!) 153/107 (BP Location: Right Arm)   Pulse (!) 132   Temp 98.3 F (36.8 C) (Oral)   Resp (!) 22   SpO2 100%   Physical Exam Vitals and nursing note reviewed.  Constitutional:      General: He is not in acute distress.    Appearance: He is well-developed. He is not ill-appearing.  HENT:     Head: Normocephalic and atraumatic.  Eyes:     Conjunctiva/sclera: Conjunctivae normal.  Cardiovascular:     Rate and Rhythm: Normal rate and regular rhythm.     Heart sounds: No murmur heard. Pulmonary:     Effort: Pulmonary effort is normal. Tachypnea present. No respiratory distress.     Breath sounds: Normal breath sounds.  Abdominal:     Palpations: Abdomen  is soft.     Tenderness: There is no abdominal tenderness.  Musculoskeletal:        General: No swelling.     Cervical back: Neck supple.  Skin:    General: Skin is warm and dry.     Capillary Refill: Capillary refill takes less than 2 seconds.  Neurological:     Mental Status: He is alert.  Psychiatric:        Mood and Affect: Mood normal.     (all labs ordered are listed, but only abnormal results are displayed) Labs Reviewed  BASIC METABOLIC PANEL WITH GFR - Abnormal; Notable for the following components:      Result Value   Sodium 127 (*)    Chloride 95 (*)     CO2 20 (*)    Glucose, Bld 106 (*)    Calcium  8.8 (*)    All other components within normal limits  CBC - Abnormal; Notable for the following components:   WBC 3.2 (*)    All other components within normal limits  TROPONIN I (HIGH SENSITIVITY) - Abnormal; Notable for the following components:   Troponin I (High Sensitivity) 40 (*)    All other components within normal limits  TROPONIN I (HIGH SENSITIVITY) - Abnormal; Notable for the following components:   Troponin I (High Sensitivity) 39 (*)    All other components within normal limits  RESP PANEL BY RT-PCR (RSV, FLU A&B, COVID)  RVPGX2  BRAIN NATRIURETIC PEPTIDE  BASIC METABOLIC PANEL WITH GFR  BASIC METABOLIC PANEL WITH GFR  ETHANOL  RAPID URINE DRUG SCREEN, HOSP PERFORMED  HEPATIC FUNCTION PANEL    EKG: EKG Interpretation Date/Time:  Tuesday January 29 2024 11:03:01 EDT Ventricular Rate:  133 PR Interval:  134 QRS Duration:  68 QT Interval:  312 QTC Calculation: 464 R Axis:   32  Text Interpretation: Sinus tachycardia Nonspecific T wave abnormality Compared with prior EKG from 01/22/2024 Confirmed by Gennaro Bouchard ([id number]) on 01/29/2024 11:06:03 AM  Radiology: [facility] Chest 2 View Result Date: 01/29/2024 CLINICAL DATA:  CP EXAM: CHEST - 2 VIEW COMPARISON:  01/23/2024 FINDINGS: Similar chronic blunting of the right costophrenic sulcus. No focal airspace consolidation, pleural effusion, or pneumothorax. No cardiomegaly. Similar tortuosity/ectasia of the aorta with atherosclerosis. No acute fracture or destructive lesions. Multilevel thoracic osteophytosis. Remote left rib fractures. IMPRESSION: No acute cardiopulmonary abnormality. Electronically Signed   By: Rogelia Myers M.D.   On: 01/29/2024 11:51     Procedures   Medications Ordered in the ED  amLODipine  (NORVASC ) tablet 10 mg (has no administration in time range)  pantoprazole  (PROTONIX ) EC tablet 40 mg (has no administration in time range)  folic acid  (FOLVITE )  tablet 1 mg (has no administration in time range)  multivitamin with minerals tablet 1 tablet (has no administration in time range)  enoxaparin  (LOVENOX ) injection 40 mg (has no administration in time range)  sodium chloride  flush (NS) 0.9 % injection 3 mL (has no administration in time range)  acetaminophen  (TYLENOL ) tablet 650 mg (has no administration in time range)    Or  acetaminophen  (TYLENOL ) suppository 650 mg (has no administration in time range)  polyethylene glycol (MIRALAX  / GLYCOLAX ) packet 17 g (has no administration in time range)  0.9 %  sodium chloride  infusion (has no administration in time range)  metoprolol  tartrate (LOPRESSOR ) injection 2.5 mg (has no administration in time range)  metoprolol  tartrate (LOPRESSOR ) tablet 100 mg (has no administration in time range)  aspirin  chewable tablet 324 mg (  324 mg Oral Given 01/29/24 1252)  nitroGLYCERIN  (NITROGLYN) 2 % ointment 0.5 inch (0.5 inches Topical Given 01/29/24 1254)  sodium chloride  0.9 % bolus 500 mL (500 mLs Intravenous New Bag/Given 01/29/24 1258)                                    Medical Decision Making Cardiac monitor interpretation: Sinus tachycardia, rate 110s to 130s, no ectopy  Social determinants of health: Patient is homeless, has not been able to take his meds in over a week  Patient here for chest pain.  I have seen him for an emergency department and he looks worse today than usual.  Tachycardic in the 130s and slightly tachypneic.  States he has not been drinking as much as usual and has not been drinking daily.  Does not feel that he is going through withdrawals.  First troponin is 40, he does have a baseline troponin leak this is slightly higher than usual.  Feeling better after aspirin  and nitro.  I also given some IV fluids as he was noticed to have low sodium as well.  Social work consult placed.  Discussed patient's case with hospitalist and patient will be admitted for further workup and management.   Patient is agreeable to plan.  Problems Addressed: Chest pain, unspecified type: undiagnosed new problem with uncertain prognosis Homelessness: chronic illness or injury Hyponatremia: acute illness or injury that poses a threat to life or bodily functions Tachycardia: undiagnosed new problem with uncertain prognosis  Amount and/or Complexity of Data Reviewed External Data Reviewed: notes.    Details: Prior ED records reviewed and patient is here quite frequently for chest pain Labs: ordered. Decision-making details documented in ED Course.    Details: Ordered and reviewed by me and patient has a slight elevation in his troponin as well as some hyponatremia Radiology: ordered and independent interpretation performed. Decision-making details documented in ED Course.    Details: Chest x-ray ordered and interpreted by me and dependently of radiology and shows no acute disease process in the chest ECG/medicine tests: ordered and independent interpretation performed. Decision-making details documented in ED Course.    Details: Ordered and inter by me in the absence of cardiology shows sinus tachycardia, but no STEMI Discussion of management or test interpretation with external provider(s): Dr. Sierra spoke with him on the phone regarding the patient's case and he will meet the patient for further workup and management.  Risk OTC drugs. Prescription drug management. Drug therapy requiring intensive monitoring for toxicity. Decision regarding hospitalization. Diagnosis or treatment significantly limited by social determinants of health.    Final diagnoses:  Chest pain, unspecified type  Tachycardia  Homelessness  Hyponatremia    ED Discharge Orders     None          Gennaro Duwaine CROME, DO 01/29/24 1455
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
## 2024-01-29 NOTE — ED Provider Triage Note (Signed)
 Emergency Medicine Provider Triage Evaluation Note  Devin [name] , a 63 y.o. male  was evaluated in triage.  Pt complains of chest pain and shortness of breath. States it started today and is worse than usual.   Review of Systems  Positive: Chest pain, palpitations, shortness of breath Negative: Leg swelling, nausea, vomiting  Physical Exam  BP (!) 153/107 (BP Location: Right Arm)   Pulse (!) 132   Temp 98.3 F (36.8 C) (Oral)   Resp (!) 22   SpO2 100%  Gen:   Awake, no distress   Resp:  Normal effort  MSK:   Moves extremities without difficulty    Medical Decision Making  Medically screening exam initiated at 11:06 AM.  Appropriate orders placed.  Devin [name] was informed that the remainder of the evaluation will be completed by another provider, this initial triage assessment does not replace that evaluation, and the importance of remaining in the ED until their evaluation is complete.     Gennaro Bouchard L, DO 01/29/24 1106

## 2024-01-30 ENCOUNTER — Observation Stay (HOSPITAL_COMMUNITY)

## 2024-01-30 ENCOUNTER — Other Ambulatory Visit (HOSPITAL_COMMUNITY): Payer: Self-pay

## 2024-01-30 DIAGNOSIS — R0789 Other chest pain: Secondary | ICD-10-CM

## 2024-01-30 DIAGNOSIS — N179 Acute kidney failure, unspecified: Secondary | ICD-10-CM | POA: Diagnosis not present

## 2024-01-30 DIAGNOSIS — Z1152 Encounter for screening for COVID-19: Secondary | ICD-10-CM | POA: Diagnosis not present

## 2024-01-30 DIAGNOSIS — E785 Hyperlipidemia, unspecified: Secondary | ICD-10-CM | POA: Diagnosis not present

## 2024-01-30 LAB — BASIC METABOLIC PANEL WITH GFR
Anion gap: 8 (ref 5–15)
BUN: 15 mg/dL (ref 8–23)
CO2: 19 mmol/L — ABNORMAL LOW (ref 22–32)
Calcium: 8.3 mg/dL — ABNORMAL LOW (ref 8.9–10.3)
Chloride: 102 mmol/L (ref 98–111)
Creatinine, Ser: 1.07 mg/dL (ref 0.61–1.24)
GFR, Estimated: >60 mL/min (ref >60)
Glucose, Bld: 105 mg/dL — ABNORMAL HIGH (ref 70–99)
Potassium: 3.9 mmol/L (ref 3.5–5.1)
Sodium: 129 mmol/L — ABNORMAL LOW (ref 135–145)

## 2024-01-30 LAB — CBC
HCT: 36.1 % — ABNORMAL LOW (ref 39.0–52.0)
Hemoglobin: 12.5 g/dL — ABNORMAL LOW (ref 13.0–17.0)
MCH: 33.6 pg (ref 26.0–34.0)
MCHC: 34.6 g/dL (ref 30.0–36.0)
MCV: 97 fL (ref 80.0–100.0)
Platelets: 205 K/uL (ref 150–400)
RBC: 3.72 MIL/uL — ABNORMAL LOW (ref 4.22–5.81)
RDW: 12.7 % (ref 11.5–15.5)
WBC: 4.3 K/uL (ref 4.0–10.5)
nRBC: 0 % (ref 0.0–0.2)

## 2024-01-30 LAB — TROPONIN I (HIGH SENSITIVITY): Troponin I (High Sensitivity): 35 ng/L — ABNORMAL HIGH (ref <18)

## 2024-01-30 MED ORDER — PANTOPRAZOLE SODIUM 40 MG PO TBEC
40.0000 mg | DELAYED_RELEASE_TABLET | Freq: Every day | ORAL | 0 refills | Status: AC
  Filled 2024-01-30: qty 30, 30d supply, fill #0

## 2024-01-30 MED ORDER — AMLODIPINE BESYLATE 10 MG PO TABS
10.0000 mg | ORAL_TABLET | Freq: Every day | ORAL | 0 refills | Status: AC
  Filled 2024-01-30: qty 30, 30d supply, fill #0

## 2024-01-30 MED ORDER — NITROGLYCERIN 0.4 MG SL SUBL
SUBLINGUAL_TABLET | SUBLINGUAL | Status: AC
  Filled 2024-01-30: qty 2

## 2024-01-30 MED ORDER — NITROGLYCERIN 0.4 MG SL SUBL
0.8000 mg | SUBLINGUAL_TABLET | Freq: Once | SUBLINGUAL | Status: AC
  Administered 2024-01-30: 0.8 mg via SUBLINGUAL

## 2024-01-30 MED ORDER — IOHEXOL 350 MG/ML SOLN
100.0000 mL | Freq: Once | INTRAVENOUS | Status: AC | PRN
  Administered 2024-01-30: 100 mL via INTRAVENOUS

## 2024-01-30 MED ORDER — METOPROLOL TARTRATE 5 MG/5ML IV SOLN
10.0000 mg | Freq: Once | INTRAVENOUS | Status: AC | PRN
  Administered 2024-01-30: 10 mg via INTRAVENOUS
  Filled 2024-01-30: qty 10

## 2024-01-30 MED ORDER — ALLOPURINOL 300 MG PO TABS
300.0000 mg | ORAL_TABLET | Freq: Every day | ORAL | 0 refills | Status: AC
  Filled 2024-01-30: qty 30, 30d supply, fill #0

## 2024-01-30 MED ORDER — NITROGLYCERIN 0.4 MG SL SUBL: 0.8000 mg | SUBLINGUAL_TABLET | Freq: Once | SUBLINGUAL | Status: AC

## 2024-01-30 MED ORDER — METOPROLOL TARTRATE 100 MG PO TABS
100.0000 mg | ORAL_TABLET | Freq: Every day | ORAL | 0 refills | Status: AC
  Filled 2024-01-30: qty 30, 30d supply, fill #0

## 2024-01-30 MED ORDER — DILTIAZEM HCL 25 MG/5ML IV SOLN
10.0000 mg | INTRAVENOUS | Status: AC | PRN
  Administered 2024-01-30 (×2): 10 mg via INTRAVENOUS
  Filled 2024-01-30 (×5): qty 5

## 2024-01-30 NOTE — TOC Initial Note (Addendum)
 Transition of Care [facility]) - Initial/Assessment Note    Patient Details  Name: Devin [name] MRN: [id number] Date of Birth: 07-Mar-1961  Transition of Care [facility]) CM/SW Contact:    Isaiah Public, LCSWA Phone Number: 01/30/2024, 11:16 AM  Clinical Narrative:                    CSW spoke with patient at bedside. Patient reports PTA he was staying in apartment with roommate.Patient reports he doesn't plan on returning to apartment. Patient informed CSW that his plan at dc is to stay with his niece in [place] or his niece's ex husband.Patient reports his niece will pick him up in [place]. Patient reports he plans on staying with them until he finds new housing.CSW offered patient housing resources. Patient accepted.Patient reports he will need transportation at dc. CSW provided patient with bus passes. Patient reports he has a primary care physician in [facility] [place].Patient reports he uses Development worker, community in [place]. All questions answered. No further questions reported at this time.       Patient Goals and CMS Choice            Expected Discharge Plan and Services         Expected Discharge Date: 02/01/24                                    Prior Living Arrangements/Services                       Activities of Daily Living   ADL Screening (condition at time of admission) Independently performs ADLs?: Yes (appropriate for developmental age) Is the patient deaf or have difficulty hearing?: No Does the patient have difficulty seeing, even when wearing glasses/contacts?: No Does the patient have difficulty concentrating, remembering, or making decisions?: Yes  Permission Sought/Granted                  Emotional Assessment              Admission diagnosis:  Hyponatremia [E87.1] Tachycardia [R00.0] Homelessness [Z59.00] Chest pain [R07.9] Chest pain, unspecified type [R07.9] Patient Active Problem List   Diagnosis Date  Noted   Chest pain 01/12/2024   High anion gap metabolic acidosis 01/12/2024   Hyponatremia 01/12/2024   Hypomagnesemia 01/03/2024   Avascular necrosis of femur head, left (HCC) 04/06/2021   Musculoskeletal chest pain 07/08/2017   Essential hypertension 07/08/2017   Normal coronary arteries 05/28/2017   Medication management 01/29/2017   Normocytic anemia 12/25/2016   Alcoholic cirrhosis (HCC) 11/20/2016   Esophagitis 05/11/2016   GERD with esophagitis 06/21/2015   Transaminitis 06/21/2015   External hemorrhoid 01/12/2015   Depression 09/29/2014   Chronic gout of right elbow 07/07/2014   Hypokalemia 05/04/2013   Homelessness 05/03/2013   H/O medication noncompliance 05/03/2013   Tobacco abuse 07/15/2012   Healthcare maintenance 11/29/2011   Hyperlipidemia 05/25/2010   Alcohol use disorder, moderate, dependence (HCC) 02/02/2010   Hypertensive cardiomyopathy, without heart failure (HCC) 02/02/2010   PCP:  Pcp, No Pharmacy:   [id number] - [place], [place] - [address] AT [facility] [address] [place] [address] Phone: [phone] Fax: [phone]  [facility] [address] [place] [address] Phone: [phone] Fax: [phone]  [id number] - [place], [place] - [address] AT [facility] [address] [place] [address] Phone: [phone] Fax: [phone]     Social Drivers of Health (SDOH) Social History: SDOH Screenings   Food Insecurity: Food Insecurity Present (01/29/2024)  Housing: High Risk (01/29/2024)  Transportation Needs: Unmet Transportation Needs (01/29/2024)  Utilities: Not At Risk (01/29/2024)  Depression (PHQ2-9): Medium Risk (05/16/2018)  Social Connections: Socially Isolated (01/29/2024)  Tobacco Use: High Risk (01/29/2024)   SDOH Interventions:     Readmission Risk Interventions    01/04/2024     9:16 AM  Readmission Risk Prevention Plan  Transportation Screening Complete  PCP or Specialist Appt within 3-5 Days Complete  HRI or Home Care Consult Complete  Social Work Consult for Recovery Care Planning/Counseling Complete  Palliative Care Screening Complete  Medication Review Oceanographer) Referral to Pharmacy

## 2024-01-30 NOTE — Discharge Summary (Signed)
 Physician Discharge Summary   Patient: Devin [name] MRN: [id number] DOB: 1961/01/04  Admit date:     01/29/2024  Discharge date: 01/30/24  Discharge Physician: Lonni SHAUNNA Dalton   PCP: Ferol Harlene Arts     Recommendations at discharge:  Follow up with PCP Harlene Ferol in 1 week for hypertension, atypical chest pain Stacey Gipe: Please check BMP in 1 week (discharge Na 129) Obtain ECG in 1 week follow up if still having frequent PVCs     Discharge Diagnoses: Principal hospital diagnosis:   Atypical chest pain Active Problems:   AKI   Hyperlipidemia   GERD with esophagitis   Normocytic anemia   Essential hypertension   Hyponatremia     Hospital Course: 63 y.o. M with history smoking, cocaine use (in remission), who presented with SSCP, pleuritic pain and palpitations, worse for the last few weeks since running out of his medicines.  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  Had abnormal Myoview in 2007, but follow up LHC normal, and he was treated with metoprolol  and Lipitor by Dr. Alveta.  The current pain is not exertional (EDP notes are incorrect), patient reports increasing frequency in the last 1-2 weeks of SSCP, lasting ~30 minutes, not consistent with exertion or emotional stress, sometimes happening spontaneously at rest, no swallowing or food-related provocation.    In the ER, no ST changes, nominal troponin elevation.  This morning, high burden of PVCs.    Chest pain, noncardiac See history above.  Symptoms are longstanding, nonexertional.  CTA 1 week ago ruled out PE in context of identical symptoms.  ECG normal, troponins nominally elevated but flat.  Discussed with Cardiology here, they recommended coronary CTA given risk factors.  This was completed and showed no evidence of atherosclerosis.  MI ruled out. - Resume metoprolol , amlodipine , and lisinopril   (scripts provided in hand at discharge)   Frequent PVCs In the hospital, he had frequent PVCs noted, no sustained arryhthmia.  Echo two weeks ago showed normal EF.  Electrolytes replete. - Resume metoprolol  - Follow up with PCP, consider outpatietn monitoring if persistent  Hyponatremia Mild, asymptomatic - Follow up with PCP  AKI Cr 1.5 on admission, resolved to 0.9 with IV fluids.            The [place]  Controlled Substances Registry was reviewed for this patient prior to discharge.  Consultants: None Procedures performed: Coronary CTA  Disposition: Home Diet recommendation:  Discharge Diet Orders (From admission, onward)     Start     Ordered   01/30/24 0000  Diet - low sodium heart healthy        01/30/24 1554             DISCHARGE MEDICATION: Allergies as of 01/30/2024   No Known Allergies      Medication List     TAKE these medications    acetaminophen  500 MG tablet Commonly known as: TYLENOL  Take 1,000 mg by mouth once as needed for moderate pain (pain score 4-6).   allopurinol  300 MG tablet Commonly known as: ZYLOPRIM  Take 1 tablet (300 mg total) by mouth daily.   amLODipine  10 MG tablet Commonly known as: NORVASC  Take 1 tablet (10 mg total) by mouth daily.   CertaVite/Antioxidants Tabs Take 1 tablet by mouth daily.   folic acid  1 MG tablet Commonly known as: FOLVITE  Take 1 tablet (1 mg  total) by mouth daily.   metoprolol  tartrate 100 MG tablet Commonly known as: LOPRESSOR  Take 1 tablet (100 mg total) by mouth daily.   ondansetron  4 MG disintegrating tablet Commonly known as: ZOFRAN -ODT Take 1 tablet (4 mg total) by mouth every 8 (eight) hours as needed for nausea or vomiting.   pantoprazole  40 MG tablet Commonly known as: Protonix  Take 1 tablet (40 mg total) by mouth daily.        Follow-up Information     Ferol Harlene Arts. Schedule an appointment as soon as possible for a visit in 1 week(s).   Specialty:  General Practice Contact information: [address] [place] [address] [phone]                 Discharge Instructions     Diet - low sodium heart healthy   Complete by: As directed    Discharge instructions   Complete by: As directed    **IMPORTANT DISCHARGE INSTRUCTIONS**   From Dr. Jonel: You were evaluated for chest pain and palpitations (extra heart beats)  Here, your EKG (the electrical activity of the heart) was normal. Your blood work was inconclusive (didn't explain one way or the other what was happening) and so we did a coronary CT angiogram.  This is a type of imaging test (Cat scan) that looks at the arteries of the heart  Thankfully, it showed no blockages, which rules out a heart attack  You should resume your normal home medicines  You need to call your primary care doctor asap to schedule a follow up appointment.   Increase activity slowly   Complete by: As directed        Discharge Exam: Filed Weights   01/30/24 0756  Weight: 72 kg    General: Pt is alert, awake, not in acute distress Cardiovascular: RRR, nl S1-S2, no murmurs appreciated.   No LE edema.   Respiratory: Normal respiratory rate and rhythm.  CTAB without rales or wheezes. Abdominal: Abdomen soft and non-tender.  No distension or HSM.   Neuro/Psych: Strength symmetric in upper and lower extremities.  Judgment and insight appear normal.   Condition at discharge: good  The results of significant diagnostics from this hospitalization (including imaging, microbiology, ancillary and laboratory) are listed below for reference.   Imaging Studies: CT CORONARY MORPH W/CTA COR W/SCORE W/CA W/CM &/OR WO/CM Result Date: 01/30/2024 CLINICAL DATA:  Chest pain EXAM: Cardiac/Coronary CTA TECHNIQUE: A non-contrast, gated CT scan was obtained with axial slices of 3 mm through the heart for calcium  scoring. Calcium  scoring was performed using the Agatston method. A 100 kV  prospective, gated, contrast cardiac scan was obtained. Gantry rotation speed was 250 msecs and collimation was 0.6 mm. Two sublingual nitroglycerin  tablets (0.8 mg) were given. The 3D data set was reconstructed in 5% intervals of the 35-75% of the R-R cycle. Diastolic phases were analyzed on a dedicated workstation using MPR, MIP, and VRT modes. The patient received 95 cc of contrast. FINDINGS: Image quality: Excellent. Noise artifact is: Limited. Coronary Arteries:  Normal coronary origin.  Right dominance. Left main: The left main is a large caliber vessel with a normal take off from the left coronary cusp that bifurcates to form a left anterior descending artery and a left circumflex artery. There is no plaque or stenosis. Left anterior descending artery: The LAD is patent without evidence of plaque or stenosis. The LAD gives off 2 patent diagonal branches. Left circumflex artery: The LCX is non-dominant  and patent with no evidence of plaque or stenosis. The LCX gives off 2 patent obtuse marginal branches. Right coronary artery: The RCA is dominant with normal take off from the right coronary cusp. There is no evidence of plaque or stenosis. The RCA terminates as a PDA without evidence of plaque or stenosis. Right Atrium: Right atrial size is within normal limits. Right Ventricle: The right ventricular cavity is within normal limits. Left Atrium: Left atrial size is normal in size with no left atrial appendage filling defect. Left Ventricle: The ventricular cavity size is within normal limits. Pulmonary arteries: Normal in size. Pulmonary veins: Normal pulmonary venous drainage. Pericardium: Normal thickness without significant effusion or calcium  present. Cardiac valves: The aortic valve is trileaflet without significant calcification. The mitral valve is normal without significant calcification. Aorta: Normal caliber without significant disease. Extra-cardiac findings: See attached radiology report for  non-cardiac structures. IMPRESSION: 1. Coronary calcium  score of 0. This was 0 percentile for age-, sex, and race-matched controls. 2. Normal coronary origin with right dominance. 3. Normal coronary arteries. RECOMMENDATIONS: 1. CAD-RADS 0: No evidence of CAD (0%). Consider non-atherosclerotic causes of chest pain. Darryle Decent, MD Electronically Signed   By: Darryle Decent M.D.   On: 01/30/2024 15:26   DG Chest 2 View Result Date: 01/29/2024 CLINICAL DATA:  CP EXAM: CHEST - 2 VIEW COMPARISON:  01/23/2024 FINDINGS: Similar chronic blunting of the right costophrenic sulcus. No focal airspace consolidation, pleural effusion, or pneumothorax. No cardiomegaly. Similar tortuosity/ectasia of the aorta with atherosclerosis. No acute fracture or destructive lesions. Multilevel thoracic osteophytosis. Remote left rib fractures. IMPRESSION: No acute cardiopulmonary abnormality. Electronically Signed   By: Rogelia Myers M.D.   On: 01/29/2024 11:51   CT Angio Chest PE W/Cm &/Or Wo Cm Result Date: 01/23/2024 CLINICAL DATA:  Epigastric pain. EXAM: CT ANGIOGRAPHY CHEST CT ABDOMEN AND PELVIS WITH CONTRAST TECHNIQUE: Multidetector CT imaging of the chest was performed using the standard protocol during bolus administration of intravenous contrast. Multiplanar CT image reconstructions and MIPs were obtained to evaluate the vascular anatomy. Multidetector CT imaging of the abdomen and pelvis was performed using the standard protocol during bolus administration of intravenous contrast. RADIATION DOSE REDUCTION: This exam was performed according to the departmental dose-optimization program which includes automated exposure control, adjustment of the mA and/or kV according to patient size and/or use of iterative reconstruction technique. CONTRAST:  75mL OMNIPAQUE  IOHEXOL  350 MG/ML SOLN COMPARISON:  CT abdomen pelvis 01/15/2024 and CT chest 01/11/2024. FINDINGS: CTA CHEST FINDINGS Cardiovascular: Negative for pulmonary embolus.  Heart is at the upper limits of normal in size. Left ventricle is hypertrophied. No pericardial effusion. Mediastinum/Nodes: No pathologically enlarged mediastinal, hilar or axillary lymph nodes. Esophagus is grossly unremarkable. Lungs/Pleura: Centrilobular and paraseptal emphysema. Subsegmental atelectasis in the right lower lobe with adjacent pleural thickening and small amount of pleural fluid. Lungs are otherwise clear. Airway is unremarkable. Musculoskeletal: Degenerative changes in the spine. Old sternal and bilateral rib fractures. Review of the MIP images confirms the above findings. CT ABDOMEN and PELVIS FINDINGS Hepatobiliary: Liver and gallbladder are unremarkable. No biliary ductal dilatation. Pancreas: Negative. Spleen: Negative. Adrenals/Urinary Tract: Adrenal glands and kidneys are unremarkable. Ureters are decompressed. Bladder is low in volume and largely obscured by streak artifact from a left hip arthroplasty. Stomach/Bowel: Stomach, small bowel, appendix and colon are unremarkable. Vascular/Lymphatic: Atherosclerotic calcification of the aorta. No pathologically enlarged lymph nodes. Reproductive: Prostate is largely obscured by streak artifact from a left hip arthroplasty. Other: No free fluid.  Mesenteries  and peritoneum are unremarkable. Musculoskeletal: Left hip arthroplasty. Degenerative changes in the spine. Review of the MIP images confirms the above findings. IMPRESSION: 1. Negative for pulmonary embolus. 2. No acute findings. 3. Pleuroparenchymal scarring in the base of the right hemithorax. 4.  Aortic atherosclerosis (ICD10-I70.0). 5.  Emphysema (ICD10-J43.9). Electronically Signed   By: Newell Eke M.D.   On: 01/23/2024 14:22   CT ABDOMEN PELVIS W CONTRAST Result Date: 01/23/2024 CLINICAL DATA:  Epigastric pain. EXAM: CT ANGIOGRAPHY CHEST CT ABDOMEN AND PELVIS WITH CONTRAST TECHNIQUE: Multidetector CT imaging of the chest was performed using the standard protocol during bolus  administration of intravenous contrast. Multiplanar CT image reconstructions and MIPs were obtained to evaluate the vascular anatomy. Multidetector CT imaging of the abdomen and pelvis was performed using the standard protocol during bolus administration of intravenous contrast. RADIATION DOSE REDUCTION: This exam was performed according to the departmental dose-optimization program which includes automated exposure control, adjustment of the mA and/or kV according to patient size and/or use of iterative reconstruction technique. CONTRAST:  75mL OMNIPAQUE  IOHEXOL  350 MG/ML SOLN COMPARISON:  CT abdomen pelvis 01/15/2024 and CT chest 01/11/2024. FINDINGS: CTA CHEST FINDINGS Cardiovascular: Negative for pulmonary embolus. Heart is at the upper limits of normal in size. Left ventricle is hypertrophied. No pericardial effusion. Mediastinum/Nodes: No pathologically enlarged mediastinal, hilar or axillary lymph nodes. Esophagus is grossly unremarkable. Lungs/Pleura: Centrilobular and paraseptal emphysema. Subsegmental atelectasis in the right lower lobe with adjacent pleural thickening and small amount of pleural fluid. Lungs are otherwise clear. Airway is unremarkable. Musculoskeletal: Degenerative changes in the spine. Old sternal and bilateral rib fractures. Review of the MIP images confirms the above findings. CT ABDOMEN and PELVIS FINDINGS Hepatobiliary: Liver and gallbladder are unremarkable. No biliary ductal dilatation. Pancreas: Negative. Spleen: Negative. Adrenals/Urinary Tract: Adrenal glands and kidneys are unremarkable. Ureters are decompressed. Bladder is low in volume and largely obscured by streak artifact from a left hip arthroplasty. Stomach/Bowel: Stomach, small bowel, appendix and colon are unremarkable. Vascular/Lymphatic: Atherosclerotic calcification of the aorta. No pathologically enlarged lymph nodes. Reproductive: Prostate is largely obscured by streak artifact from a left hip arthroplasty. Other:  No free fluid.  Mesenteries and peritoneum are unremarkable. Musculoskeletal: Left hip arthroplasty. Degenerative changes in the spine. Review of the MIP images confirms the above findings. IMPRESSION: 1. Negative for pulmonary embolus. 2. No acute findings. 3. Pleuroparenchymal scarring in the base of the right hemithorax. 4.  Aortic atherosclerosis (ICD10-I70.0). 5.  Emphysema (ICD10-J43.9). Electronically Signed   By: Newell Eke M.D.   On: 01/23/2024 14:22   DG Chest 2 View Result Date: 01/22/2024 EXAM: 2 VIEW(S) XRAY OF THE CHEST 01/22/2024 10:56:00 PM COMPARISON: 01/11/2024 CLINICAL HISTORY: chest pain. [place] [facility] [place] Says he was walking around and began to feel dizzy and have some right sided chest pains that radiate to abdomen. ; ; Says sometimes he has high and low blood pressure FINDINGS: LUNGS AND PLEURA: Small right pleural effusion. No pneumothorax. No focal pulmonary opacity. No pulmonary edema. HEART AND MEDIASTINUM: Tortuous thoracic aorta. Ascending aortic aneurysm. No acute abnormality of the cardiac and mediastinal silhouettes. BONES AND SOFT TISSUES: Old healed left rib fractures. No acute osseous abnormality. IMPRESSION: 1. Small right pleural effusion. 2. Tortuous thoracic aorta and ascending aortic aneurysm. Electronically signed by: Dorethia Molt MD 01/22/2024 11:03 PM EDT RP Workstation: [id number]   CT ABDOMEN PELVIS WO CONTRAST Result Date: 01/15/2024 CLINICAL DATA:  Abdominal pain, acute, nonlocalized EXAM: CT ABDOMEN AND PELVIS WITHOUT CONTRAST TECHNIQUE: Multidetector CT  imaging of the abdomen and pelvis was performed following the standard protocol without IV contrast. RADIATION DOSE REDUCTION: This exam was performed according to the departmental dose-optimization program which includes automated exposure control, adjustment of the mA and/or kV according to patient size and/or use of iterative reconstruction technique. COMPARISON:  CTA abdomen/pelvis dated  09/05/2023. FINDINGS: Lower chest: Trace right pleural effusion with pleural thickening, right basilar atelectasis and scarring. Hepatobiliary: No suspicious focal hepatic lesion identified within the limits of an unenhanced exam. Gallbladder is unremarkable. No biliary dilatation. Pancreas: Unremarkable. No pancreatic ductal dilatation or surrounding inflammatory changes. Spleen: Normal in size without focal abnormality. Adrenals/Urinary Tract: Adrenal glands are unremarkable. No renal calculi or hydronephrosis. Bladder is unremarkable. Stomach/Bowel: Stomach and small bowel are grossly unremarkable. No obstruction or inflammatory changes. Appendix appears normal. Vascular/Lymphatic: Abdominal aorta is normal in caliber with scattered atherosclerotic calcification. No enlarged abdominal or pelvic lymph nodes. Reproductive: Prostate is unremarkable. Other: No abdominopelvic ascites. No intraperitoneal free air. No abdominal wall hernia. Musculoskeletal: No acute osseous abnormality. No suspicious osseous lesion. Multilevel degenerative changes of the lumbar spine with disc height loss most pronounced at L5-S1. Left total hip arthroplasty. IMPRESSION: 1. No acute localizing findings in the abdomen or pelvis. 2. Trace right pleural effusion with pleural thickening and basilar atelectasis/scarring. Electronically Signed   By: Harrietta Sherry M.D.   On: 01/15/2024 12:25   US  Abdomen Limited RUQ (LIVER/GB) Result Date: 01/13/2024 EXAM: Right Upper Quadrant Abdominal Ultrasound TECHNIQUE: Real-time ultrasonography of the right upper quadrant of the abdomen was performed. COMPARISON: None. CLINICAL HISTORY: Abdominal pain FINDINGS: LIVER: The hepatic size is within normal limits. The hepatic parenchymal echogenicity is diffusely heterogeneous, nonspecific finding that can be seen in inflammatory, infiltrative or fibrotic conditions such as hepatitis, hepatic steatosis or cirrhosis. Correlation with liver enzymes may be  helpful to exclude an obstructive or inflammatory process. No focal intrahepatic masses identified. There is no intrahepatic biliary ductal dilation. The main portal vein is patent and demonstrates appropriate antegrade flow. BILIARY SYSTEM: Common bile duct measures 6 mm in proximal diameter. RIGHT KIDNEY: The right kidney is grossly unremarkable in appearances without evidence of hydronephrosis, echogenic calculi or worrisome mass lesions. PANCREAS: The pancreas is obscured by overlying bowel gas. OTHER: No right upper quadrant ascites. IMPRESSION: 1. No acute findings. 2. Hepatic parenchymal echogenicity is diffusely heterogeneous, a nonspecific finding that can be seen in inflammatory, infiltrative, or fibrotic conditions such as hepatitis, hepatic steatosis, or cirrhosis. Correlation with liver enzymes may be helpful to exclude an obstructive or inflammatory process. Electronically signed by: Dorethia Molt MD 01/13/2024 07:25 PM EDT RP Workstation: [id number]   ECHOCARDIOGRAM COMPLETE Result Date: 01/12/2024    ECHOCARDIOGRAM REPORT   Patient Name:   Fairy VEAR Fair III Date of Exam: 01/12/2024 Medical Rec #:  [id number]          Height:       69.0 in Accession #:    [id number]         Weight:       156.5 lb Date of Birth:  [birth year]         BSA:          1.862 m Patient Age:    62 years           BP:           152/121 mmHg Patient Gender: M                  HR:  79 bpm. Exam Location:  Inpatient Procedure: 2D Echo, Cardiac Doppler and Color Doppler (Both Spectral and Color            Flow Doppler were utilized during procedure). Indications:    Chest Pain R07.9  History:        Patient has no prior history of Echocardiogram examinations.                 Risk Factors:Hypertension and Dyslipidemia.  Sonographer:    Tinnie Gosling RDCS Referring Phys: [id number] [facility] RATHORE  Sonographer Comments: Suboptimal parasternal window. IMPRESSIONS  1. Left ventricular ejection fraction, by estimation, is 65  to 70%. The left ventricle has normal function. Left ventricular endocardial border not optimally defined to evaluate regional wall motion. Left ventricular diastolic parameters were normal.  2. Right ventricular systolic function is normal. The right ventricular size is normal. Tricuspid regurgitation signal is inadequate for assessing PA pressure.  3. The mitral valve is normal in structure. Trivial mitral valve regurgitation. No evidence of mitral stenosis.  4. The aortic valve is grossly normal. Aortic valve regurgitation is not visualized. No aortic stenosis is present.  5. The inferior vena cava is normal in size with greater than 50% respiratory variability, suggesting right atrial pressure of 3 mmHg. FINDINGS  Left Ventricle: Left ventricular ejection fraction, by estimation, is 65 to 70%. The left ventricle has normal function. Left ventricular endocardial border not optimally defined to evaluate regional wall motion. The left ventricular internal cavity size was normal in size. There is borderline left ventricular hypertrophy. Left ventricular diastolic parameters were normal. Right Ventricle: The right ventricular size is normal. No increase in right ventricular wall thickness. Right ventricular systolic function is normal. Tricuspid regurgitation signal is inadequate for assessing PA pressure. Left Atrium: Left atrial size was normal in size. Right Atrium: Right atrial size was normal in size. Pericardium: There is no evidence of pericardial effusion. Mitral Valve: The mitral valve is normal in structure. Trivial mitral valve regurgitation. No evidence of mitral valve stenosis. Tricuspid Valve: The tricuspid valve is normal in structure. Tricuspid valve regurgitation is trivial. No evidence of tricuspid stenosis. Aortic Valve: The aortic valve is grossly normal. Aortic valve regurgitation is not visualized. No aortic stenosis is present. Pulmonic Valve: The pulmonic valve was not well visualized.  Pulmonic valve regurgitation is not visualized. No evidence of pulmonic stenosis. Aorta: The aortic root is normal in size and structure. Venous: The inferior vena cava is normal in size with greater than 50% respiratory variability, suggesting right atrial pressure of 3 mmHg. IAS/Shunts: No atrial level shunt detected by color flow Doppler.  LEFT VENTRICLE PLAX 2D LVIDd:         3.90 cm   Diastology LVIDs:         2.30 cm   LV e' medial:    6.53 cm/s LV PW:         1.00 cm   LV E/e' medial:  9.6 LV IVS:        1.00 cm   LV e' lateral:   8.59 cm/s LVOT diam:     2.10 cm   LV E/e' lateral: 7.3 LV SV:         70 LV SV Index:   38 LVOT Area:     3.46 cm  RIGHT VENTRICLE             IVC RV S prime:     13.60 cm/s  IVC diam: 0.90 cm TAPSE (M-mode): 2.0  cm LEFT ATRIUM             Index        RIGHT ATRIUM           Index LA diam:        2.80 cm 1.50 cm/m   RA Area:     10.20 cm LA Vol (A2C):   32.6 ml 17.51 ml/m  RA Volume:   21.10 ml  11.33 ml/m LA Vol (A4C):   33.1 ml 17.78 ml/m LA Biplane Vol: 34.3 ml 18.42 ml/m  AORTIC VALVE LVOT Vmax:   105.00 cm/s LVOT Vmean:  69.800 cm/s LVOT VTI:    0.203 m  AORTA Ao Root diam: 3.50 cm Ao Asc diam:  3.00 cm MITRAL VALVE MV Area (PHT): 4.04 cm    SHUNTS MV Decel Time: 188 msec    Systemic VTI:  0.20 m MV E velocity: 62.40 cm/s  Systemic Diam: 2.10 cm MV A velocity: 57.60 cm/s MV E/A ratio:  1.08 Soyla Merck MD Electronically signed by Soyla Merck MD Signature Date/Time: 01/12/2024/3:01:09 PM    Final    CT Angio Chest PE W and/or Wo Contrast Result Date: 01/11/2024 CLINICAL DATA:  Chest pain and shortness of breath. EXAM: CT ANGIOGRAPHY CHEST WITH CONTRAST TECHNIQUE: Multidetector CT imaging of the chest was performed using the standard protocol during bolus administration of intravenous contrast. Multiplanar CT image reconstructions and MIPs were obtained to evaluate the vascular anatomy. RADIATION DOSE REDUCTION: This exam was performed according to the  departmental dose-optimization program which includes automated exposure control, adjustment of the mA and/or kV according to patient size and/or use of iterative reconstruction technique. CONTRAST:  75mL OMNIPAQUE  IOHEXOL  350 MG/ML SOLN COMPARISON:  CT angiogram chest 12/18/2023. FINDINGS: Cardiovascular: Satisfactory opacification of the pulmonary arteries to the segmental level. No evidence of pulmonary embolism. Normal heart size. No pericardial effusion. Mediastinum/Nodes: No enlarged mediastinal, hilar, or axillary lymph nodes. Thyroid gland, trachea, and esophagus demonstrate no significant findings. Lungs/Pleura: Right pleural thickening and trace effusion appear unchanged from prior examination. There is a band of opacity which is also unchanged from most recent prior. There is a slightly nodular component to this band of opacity measuring 1.8 by 1.0 cm. The lungs are otherwise clear. There is no pneumothorax. Upper Abdomen: No acute abnormality. Musculoskeletal: Degenerative changes affect the spine. Review of the MIP images confirms the above findings. IMPRESSION: 1. No evidence for pulmonary embolism. 2. Stable right pleural thickening and trace effusion. 3. Stable band of opacity in the right lower lobe with a slightly nodular component. This may represent rounded atelectasis. Electronically Signed   By: Greig Pique M.D.   On: 01/11/2024 23:07   DG Chest 2 View Result Date: 01/11/2024 CLINICAL DATA:  Chest pain EXAM: CHEST - 2 VIEW COMPARISON:  01/02/2024, chest CT 12/18/2023 FINDINGS: Low lung. Stable cardiomediastinal silhouette with prominent mediastinum likely augmented by low lung volume and patient rotation.nodular left lower lung opacity probably reflects a nipple shadow. No focal airspace disease, pleural effusion or pneumothorax IMPRESSION: 1. No active cardiopulmonary disease. 2. Nodular left lateral llower lung opacity probably reflects a nipple shadow. Electronically Signed   By: Luke Bun M.D.   On: 01/11/2024 21:04   CT Head Wo Contrast Result Date: 01/02/2024 CLINICAL DATA:  Neck trauma, intoxicated or obtunded (Age >= 16y); Head trauma, abnormal mental status (Age 17-64y) EXAM: CT HEAD WITHOUT CONTRAST CT CERVICAL SPINE WITHOUT CONTRAST TECHNIQUE: Multidetector CT imaging of the head and cervical spine  was performed following the standard protocol without intravenous contrast. Multiplanar CT image reconstructions of the cervical spine were also generated. RADIATION DOSE REDUCTION: This exam was performed according to the departmental dose-optimization program which includes automated exposure control, adjustment of the mA and/or kV according to patient size and/or use of iterative reconstruction technique. COMPARISON:  08/13/2010 FINDINGS: CT HEAD FINDINGS Brain: Normal anatomic configuration. Parenchymal volume loss is commensurate with the patient's age. Mild periventricular white matter changes are present likely reflecting the sequela of small vessel ischemia. No abnormal intra or extra-axial mass lesion or fluid collection. No abnormal mass effect or midline shift. No evidence of acute intracranial hemorrhage or infarct. Ventricular size is normal. Cerebellum unremarkable. Vascular: No asymmetric hyperdense vasculature at the skull base. Skull: Intact Sinuses/Orbits: Paranasal sinuses are clear. Orbits are unremarkable. Other: Mastoid air cells and middle ear cavities are clear. Small left parietal scalp hematoma CT CERVICAL SPINE FINDINGS Alignment: 3 mm anterolisthesis C7-T1, likely degenerative in nature. Mild cervical kyphosis. Skull base and vertebrae: Craniocervical alignment is normal. The atlantodental interval is not widened. No acute fracture of the cervical spine. Ankylosis of the left facet joints of C2-C4 as well as the vertebral bodies and right facet joints of C2-C3. Soft tissues and spinal canal: No prevertebral fluid or swelling. No visible canal hematoma. Disc  levels: Disc space narrowing endplate remodeling and vacuum disc phenomena is seen throughout the cervical spine in keeping with changes of advanced degenerative disc disease, most severe at C4-C6. Prevertebral soft tissues are not thickened on sagittal reformats. No high-grade canal stenosis. Multilevel uncovertebral and facet arthrosis results in multilevel moderate to severe neuroforaminal narrowing, most severe on the right at C4-5 C6-7 and C7-T1 and on the left at C3-4, C4-5, and C7-T1. Upper chest: Negative. Other: None IMPRESSION: 1. No acute intracranial abnormality. No calvarial fracture. Small left parietal scalp hematoma. 2. No acute fracture or listhesis of the cervical spine. 3. 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 Electronically Signed   By: Dorethia Molt M.D.   On: 01/02/2024 21:37   CT Cervical Spine Wo Contrast Result Date: 01/02/2024 CLINICAL DATA:  Neck trauma, intoxicated or obtunded (Age >= 16y); Head trauma, abnormal mental status (Age 60-64y) EXAM: CT HEAD WITHOUT CONTRAST CT CERVICAL SPINE WITHOUT CONTRAST TECHNIQUE: Multidetector CT imaging of the head and cervical spine was performed following the standard protocol without intravenous contrast. Multiplanar CT image reconstructions of the cervical spine were also generated. RADIATION DOSE REDUCTION: This exam was performed according to the departmental dose-optimization program which includes automated exposure control, adjustment of the mA and/or kV according to patient size and/or use of iterative reconstruction technique. COMPARISON:  08/13/2010 FINDINGS: CT HEAD FINDINGS Brain: Normal anatomic configuration. Parenchymal volume loss is commensurate with the patient's age. Mild periventricular white matter changes are present likely reflecting the sequela of small vessel ischemia. No abnormal intra or extra-axial mass  lesion or fluid collection. No abnormal mass effect or midline shift. No evidence of acute intracranial hemorrhage or infarct. Ventricular size is normal. Cerebellum unremarkable. Vascular: No asymmetric hyperdense vasculature at the skull base. Skull: Intact Sinuses/Orbits: Paranasal sinuses are clear. Orbits are unremarkable. Other: Mastoid air cells and middle ear cavities are clear. Small left parietal scalp hematoma CT CERVICAL SPINE FINDINGS Alignment: 3 mm anterolisthesis C7-T1, likely degenerative in nature. Mild cervical kyphosis. Skull base and vertebrae: Craniocervical alignment is normal. The atlantodental interval is not widened. No acute fracture  of the cervical spine. Ankylosis of the left facet joints of C2-C4 as well as the vertebral bodies and right facet joints of C2-C3. Soft tissues and spinal canal: No prevertebral fluid or swelling. No visible canal hematoma. Disc levels: Disc space narrowing endplate remodeling and vacuum disc phenomena is seen throughout the cervical spine in keeping with changes of advanced degenerative disc disease, most severe at C4-C6. Prevertebral soft tissues are not thickened on sagittal reformats. No high-grade canal stenosis. Multilevel uncovertebral and facet arthrosis results in multilevel moderate to severe neuroforaminal narrowing, most severe on the right at C4-5 C6-7 and C7-T1 and on the left at C3-4, C4-5, and C7-T1. Upper chest: Negative. Other: None IMPRESSION: 1. No acute intracranial abnormality. No calvarial fracture. Small left parietal scalp hematoma. 2. No acute fracture or listhesis of the cervical spine. 3. 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 Electronically Signed   By: Dorethia Molt M.D.   On: 01/02/2024 21:37   DG Chest Portable 1 View Result Date: 01/02/2024 CLINICAL DATA:  Fall. EXAM: PORTABLE CHEST 1 VIEW COMPARISON:  Chest  x-ray 12/21/2018. FINDINGS: The heart size and mediastinal contours are within normal limits. Both lungs are clear. There is a healed left sixth rib fracture. No acute fractures are seen. IMPRESSION: No active disease. Electronically Signed   By: Greig Pique M.D.   On: 01/02/2024 20:53    Microbiology: Results for orders placed or performed during the hospital encounter of 01/29/24  Resp panel by RT-PCR (RSV, Flu A&B, Covid) Anterior Nasal Swab     Status: None   Collection Time: 01/29/24 11:47 AM   Specimen: Anterior Nasal Swab  Result Value Ref Range Status   SARS Coronavirus 2 by RT PCR NEGATIVE NEGATIVE Final   Influenza A by PCR NEGATIVE NEGATIVE Final   Influenza B by PCR NEGATIVE NEGATIVE Final    Comment: (NOTE) The Xpert Xpress SARS-CoV-2/FLU/RSV plus assay is intended as an aid in the diagnosis of influenza from Nasopharyngeal swab specimens and should not be used as a sole basis for treatment. Nasal washings and aspirates are unacceptable for Xpert Xpress SARS-CoV-2/FLU/RSV testing.  Fact Sheet for Patients: BloggerCourse.com  Fact Sheet for Healthcare Providers: SeriousBroker.it  This test is not yet approved or cleared by the [place]  FDA and has been authorized for detection and/or diagnosis of SARS-CoV-2 by FDA under an Emergency Use Authorization (EUA). This EUA will remain in effect (meaning this test can be used) for the duration of the COVID-19 declaration under Section 564(b)(1) of the Act, 21 U.S.C. section 360bbb-3(b)(1), unless the authorization is terminated or revoked.     Resp Syncytial Virus by PCR NEGATIVE NEGATIVE Final    Comment: (NOTE) Fact Sheet for Patients: BloggerCourse.com  Fact Sheet for Healthcare Providers: SeriousBroker.it  This test is not yet approved or cleared by the [place]  FDA and has been authorized for detection  and/or diagnosis of SARS-CoV-2 by FDA under an Emergency Use Authorization (EUA). This EUA will remain in effect (meaning this test can be used) for the duration of the COVID-19 declaration under Section 564(b)(1) of the Act, 21 U.S.C. section 360bbb-3(b)(1), unless the authorization is terminated or revoked.  Performed at [facility], [address], [place], [place] [address]     Labs: CBC: Recent Labs  Lab 01/29/24 1103 01/30/24 0502  WBC 3.2* 4.3  HGB 14.6 12.5*  HCT 41.2 36.1*  MCV 95.4 97.0  PLT 279 205   Basic Metabolic Panel: Recent Labs  Lab 01/29/24 1103 01/29/24 1844 01/29/24 2008 01/30/24 0502  NA 127* 129* 131* 129*  K 4.6 4.1 4.4 3.9  CL 95* 97* 95* 102  CO2 20* 21* 23 19*  GLUCOSE 106* 138* 107* 105*  BUN 15 15 17 15   CREATININE 0.95 1.44* 1.55* 1.07  CALCIUM  8.8* 8.7* 8.8* 8.3*   Liver Function Tests: Recent Labs  Lab 01/29/24 1844  AST 240*  ALT 79*  ALKPHOS 96  BILITOT 1.5*  PROT 7.3  ALBUMIN 3.4*   CBG: No results for input(s): GLUCAP in the last 168 hours.  Discharge time spent: approximately 45 minutes spent on discharge counseling, evaluation of patient on day of discharge, and coordination of discharge planning with nursing, social work, pharmacy and case management  Signed: Lonni SHAUNNA Dalton, MD [facility] 01/30/2024

## 2024-01-30 NOTE — Hospital Course (Addendum)
 63 y.o. M with history smoking, cocaine use (in remission), who presented with SSCP, pleuritic pain and palpitations, worse for the last few weeks since running out of his medicines.  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  Had abnormal Myoview in 2007, but follow up LHC normal, and he was treated with metoprolol  and Lipitor by Dr. Alveta.  The current pain is not exertional (EDP notes are incorrect), patient reports increasing frequency in the last 1-2 weeks of SSCP, lasting ~30 minutes, not consistent with exertion or emotional stress, sometimes happening spontaneously at rest, no swallowing or food-related provocation.    In the ER, no ST changes, nominal troponin elevation.  This morning, high burden of PVCs.

## 2024-02-04 ENCOUNTER — Ambulatory Visit: Payer: Self-pay | Admitting: Cardiology

## 2024-02-12 ENCOUNTER — Other Ambulatory Visit (HOSPITAL_COMMUNITY): Payer: Self-pay

## 2024-02-13 NOTE — Telephone Encounter (Signed)
 Left message for patient to call back for results.  Routed results to primary - Harlene Albino

## 2024-02-13 NOTE — Telephone Encounter (Signed)
-----   Message from Alm Clay sent at 02/04/2024  4:09 PM EDT ----- Coronary CT angiogram ordered in the hospital.  For the most part results were discussed.  Coronary calcium  score is 0 with normal coronary arteries-no suggestion of atherosclerotic plaque.  This would argue that chest pain is not related to heart artery disease.  Something to follow-up with primary care provider's visit there is a stable finding in the right lung base suggesting scarring or mildly collapsed lung with fluid buildup outside the lung.  Would  recommend follow-up CT scan or chest x-ray by PCP   -> Would forward to PCP Glade KATHEE Albino, PA from [facility] telephone [phone], fax #[phone].   Alm Clay, MD   ----- Message ----- From: Interface, Rad Results In Sent: 01/30/2024   9:42 PM EDT To: Alm LELON Clay, MD

## 2024-02-15 NOTE — Telephone Encounter (Signed)
 LEFT MESSAGE TO CALL BACK
